# Patient Record
Sex: Male | Born: 1945 | Race: White | Hispanic: No | Marital: Married | Smoking: Never smoker
Health system: Southern US, Community
[De-identification: ages and names within clinical notes are randomized; demographics above are authoritative.]

## PROBLEM LIST (undated history)

## (undated) DIAGNOSIS — C801 Malignant (primary) neoplasm, unspecified: Secondary | ICD-10-CM

## (undated) DIAGNOSIS — E119 Type 2 diabetes mellitus without complications: Secondary | ICD-10-CM

## (undated) DIAGNOSIS — E7800 Pure hypercholesterolemia, unspecified: Secondary | ICD-10-CM

## (undated) DIAGNOSIS — I10 Essential (primary) hypertension: Secondary | ICD-10-CM

## (undated) DIAGNOSIS — I4891 Unspecified atrial fibrillation: Secondary | ICD-10-CM

## (undated) HISTORY — PX: SHOULDER SURGERY: SHX246

## (undated) HISTORY — PX: KNEE SURGERY: SHX244

## (undated) HISTORY — PX: REPLACEMENT TOTAL KNEE: SUR1224

---

## 2005-07-17 ENCOUNTER — Emergency Department (HOSPITAL_COMMUNITY): Admission: EM | Admit: 2005-07-17 | Discharge: 2005-07-17 | Payer: Self-pay | Admitting: *Deleted

## 2006-08-31 ENCOUNTER — Other Ambulatory Visit: Payer: Self-pay

## 2006-08-31 ENCOUNTER — Ambulatory Visit: Payer: Self-pay | Admitting: Unknown Physician Specialty

## 2006-09-01 ENCOUNTER — Ambulatory Visit: Payer: Self-pay | Admitting: Unknown Physician Specialty

## 2006-09-27 ENCOUNTER — Emergency Department: Payer: Self-pay | Admitting: Emergency Medicine

## 2006-10-17 ENCOUNTER — Emergency Department: Payer: Self-pay | Admitting: Emergency Medicine

## 2007-08-30 ENCOUNTER — Emergency Department (HOSPITAL_COMMUNITY): Admission: EM | Admit: 2007-08-30 | Discharge: 2007-08-30 | Payer: Self-pay | Admitting: Emergency Medicine

## 2007-08-30 ENCOUNTER — Ambulatory Visit: Payer: Self-pay | Admitting: Gastroenterology

## 2009-08-28 ENCOUNTER — Emergency Department: Payer: Self-pay | Admitting: Emergency Medicine

## 2009-09-30 ENCOUNTER — Emergency Department (HOSPITAL_COMMUNITY): Admission: EM | Admit: 2009-09-30 | Discharge: 2009-09-30 | Payer: Self-pay | Admitting: Emergency Medicine

## 2010-07-29 NOTE — Op Note (Signed)
Cameron Gutierrez, Cameron Gutierrez               ACCOUNT NO.:  [id number]   MEDICAL RECORD NO.:  [id number]          PATIENT TYPE:  EMS   LOCATION:  ED                            FACILITY:  [facility]   PHYSICIAN:  Kassie Mends, M.D.      DATE OF BIRTH:  26-Apr-1945   DATE OF PROCEDURE:  08/30/2007  DATE OF DISCHARGE:                               OPERATIVE REPORT   REFERRING PHYSICIAN:  Dr. Elesa Hacker.   PRIMARY PHYSICIAN:  Loma Sender in [place].   PROCEDURE:  Esophagogastroscopy.   INDICATION FOR EXAM:  Mr. Bonser a 65 year old male who was eating  chicken at [employer] approximately 2 hours ago and he got a  chicken bone lodged in his esophagus.  He presents with pain with  swallowing.  He has had no vomiting.  He had no heartburn, indigestion,  problems swallowing, rectal bleeding.  He did see a small amount of  blood in the vomit.   FINDINGS:  1. Chicken bone lodged in the hypopharynx.  The chicken bone was      snared and removed without difficulty.  A laceration was seen in      the posterior wall the hypopharynx.  2. Normal esophagus without evidence of Barrett's mass, erosion,      ulceration, stricture or foreign body.  3. The stomach was full of food and so a limited exam of the stomach      was performed.  The scope was not advanced into the duodenum due to      risk of aspiration.   DIAGNOSIS:  Foreign body in the hypopharynx.   RECOMMENDATIONS:  1. Return to the emergency department for subsequent evaluation.      Consider ENT consultation for subsequent management.  2. The patient was given the bone and pictures.   MEDICATIONS:  1. Demerol 100 mg IV.  2. Versed 4 mg IV.   PROCEDURE TECHNIQUE:  Physical exam was performed.  Informed consent was  obtained from the patient after explaining the benefits, risks and  alternatives of the procedure.  The patient was connected to the monitor  and placed in the left lateral position.  Continuous oxygen was provided  by  nasal cannula and IV medicine administered through an indwelling  cannula.  After administration of sedation, the patient's hypopharynx  was intubated and the scope was advanced into the hypopharynx.  The  chicken bone was identified.  A snare was used to retrieve the bone.  The scope was then removed and the scope was advanced into the proximal  stomach, which had a large amount of  food in it.  The scope was removed slowly.  I carefully examined the  color, texture, anatomy and integrity of the mucosa on the way out.  The  patient was recovered in endoscopy and discharged to the emergency  department in stable condition.      Kassie Mends, M.D.  Electronically Signed     SM/MEDQ  D:  08/30/2007  T:  08/30/2007  Job:  [id number]   cc:   Loma Sender  Fax: [phone]

## 2013-09-13 ENCOUNTER — Emergency Department: Payer: Self-pay | Admitting: Emergency Medicine

## 2013-09-24 ENCOUNTER — Emergency Department: Payer: Self-pay | Admitting: Emergency Medicine

## 2013-09-24 LAB — BASIC METABOLIC PANEL
ANION GAP: 8 (ref 7–16)
BUN: 29 mg/dL — ABNORMAL HIGH (ref 7–18)
CALCIUM: 8.8 mg/dL (ref 8.5–10.1)
CHLORIDE: 105 mmol/L (ref 98–107)
CO2: 28 mmol/L (ref 21–32)
CREATININE: 1.5 mg/dL — AB (ref 0.60–1.30)
EGFR (Non-African Amer.): 48 — ABNORMAL LOW
GFR CALC AF AMER: 55 — AB
GLUCOSE: 127 mg/dL — AB (ref 65–99)
Osmolality: 289 (ref 275–301)
Potassium: 3.8 mmol/L (ref 3.5–5.1)
SODIUM: 141 mmol/L (ref 136–145)

## 2013-09-24 LAB — CBC WITH DIFFERENTIAL/PLATELET
BASOS ABS: 0 10*3/uL (ref 0.0–0.1)
BASOS PCT: 0.4 %
EOS ABS: 0.3 10*3/uL (ref 0.0–0.7)
EOS PCT: 3.4 %
HCT: 43.7 % (ref 40.0–52.0)
HGB: 14.6 g/dL (ref 13.0–18.0)
LYMPHS ABS: 1.4 10*3/uL (ref 1.0–3.6)
Lymphocyte %: 17.4 %
MCH: 29.1 pg (ref 26.0–34.0)
MCHC: 33.4 g/dL (ref 32.0–36.0)
MCV: 87 fL (ref 80–100)
Monocyte #: 0.7 x10 3/mm (ref 0.2–1.0)
Monocyte %: 8.6 %
Neutrophil #: 5.5 10*3/uL (ref 1.4–6.5)
Neutrophil %: 70.2 %
Platelet: 170 10*3/uL (ref 150–440)
RBC: 5.01 10*6/uL (ref 4.40–5.90)
RDW: 13.2 % (ref 11.5–14.5)
WBC: 7.9 10*3/uL (ref 3.8–10.6)

## 2014-07-28 ENCOUNTER — Emergency Department
Admission: EM | Admit: 2014-07-28 | Discharge: 2014-07-28 | Disposition: A | Discharge status: Home / Self Care | Payer: Medicare Other | Attending: Emergency Medicine | Admitting: Emergency Medicine

## 2014-07-28 ENCOUNTER — Emergency Department: Payer: Medicare Other

## 2014-07-28 ENCOUNTER — Encounter: Payer: Self-pay | Admitting: Emergency Medicine

## 2014-07-28 DIAGNOSIS — Y9289 Other specified places as the place of occurrence of the external cause: Secondary | ICD-10-CM | POA: Insufficient documentation

## 2014-07-28 DIAGNOSIS — I10 Essential (primary) hypertension: Secondary | ICD-10-CM | POA: Insufficient documentation

## 2014-07-28 DIAGNOSIS — Y9389 Activity, other specified: Secondary | ICD-10-CM | POA: Diagnosis not present

## 2014-07-28 DIAGNOSIS — W01198A Fall on same level from slipping, tripping and stumbling with subsequent striking against other object, initial encounter: Secondary | ICD-10-CM | POA: Insufficient documentation

## 2014-07-28 DIAGNOSIS — Y998 Other external cause status: Secondary | ICD-10-CM | POA: Insufficient documentation

## 2014-07-28 DIAGNOSIS — S20221A Contusion of right back wall of thorax, initial encounter: Secondary | ICD-10-CM | POA: Diagnosis not present

## 2014-07-28 DIAGNOSIS — S3992XA Unspecified injury of lower back, initial encounter: Secondary | ICD-10-CM | POA: Diagnosis present

## 2014-07-28 HISTORY — DX: Essential (primary) hypertension: I10

## 2014-07-28 LAB — URINALYSIS COMPLETE WITH MICROSCOPIC (ARMC ONLY)
Bilirubin Urine: NEGATIVE
Glucose, UA: NEGATIVE mg/dL
KETONES UR: NEGATIVE mg/dL
Leukocytes, UA: NEGATIVE
NITRITE: NEGATIVE
Protein, ur: 30 mg/dL — AB
Specific Gravity, Urine: 1.019 (ref 1.005–1.030)
pH: 5 (ref 5.0–8.0)

## 2014-07-28 MED ORDER — HYDROCODONE-ACETAMINOPHEN 5-325 MG PO TABS: 1.0000 | ORAL_TABLET | ORAL | Status: DC | PRN

## 2014-07-28 MED ORDER — BACLOFEN 10 MG PO TABS: 10.0000 mg | ORAL_TABLET | Freq: Two times a day (BID) | ORAL | Status: DC

## 2014-07-28 MED ORDER — HYDROCODONE-ACETAMINOPHEN 5-325 MG PO TABS
1.0000 | ORAL_TABLET | Freq: Once | ORAL | Status: AC
  Administered 2014-07-28: 1 via ORAL

## 2014-07-28 MED ORDER — HYDROCODONE-ACETAMINOPHEN 5-325 MG PO TABS
ORAL_TABLET | ORAL | Status: AC
  Filled 2014-07-28: qty 1

## 2014-07-28 NOTE — ED Notes (Signed)
R low mid back

## 2014-07-28 NOTE — ED Notes (Signed)
Pt c/o pain in ribs

## 2014-07-28 NOTE — Discharge Instructions (Signed)
Contusion °A contusion is a deep bruise. Contusions happen when an injury causes bleeding under the skin. Signs of bruising include pain, puffiness (swelling), and discolored skin. The contusion may turn blue, purple, or yellow. °HOME CARE  °· Put ice on the injured area. °· Put ice in a plastic bag. °· Place a towel between your skin and the bag. °· Leave the ice on for 15-20 minutes, 03-04 times a day. °· Only take medicine as told by your doctor. °· Rest the injured area. °· If possible, raise (elevate) the injured area to lessen puffiness. °GET HELP RIGHT AWAY IF:  °· You have more bruising or puffiness. °· You have pain that is getting worse. °· Your puffiness or pain is not helped by medicine. °MAKE SURE YOU:  °· Understand these instructions. °· Will watch your condition. °· Will get help right away if you are not doing well or get worse. °Document Released: 08/19/2007 Document Revised: 05/25/2011 Document Reviewed: 01/05/2011 °ExitCare® Patient Information ©2015 [employer]. This information is not intended to replace advice given to you by your health care provider. Make sure you discuss any questions you have with your health care provider. ° °Blunt Trauma °You have been evaluated for injuries. You have been examined and your caregiver has not found injuries serious enough to require hospitalization. °It is common to have multiple bruises and sore muscles following an accident. These tend to feel worse for the first 24 hours. You will feel more stiffness and soreness over the next several hours and worse when you wake up the first morning after your accident. After this point, you should begin to improve with each passing day. The amount of improvement depends on the amount of damage done in the accident. °Following your accident, if some part of your body does not work as it should, or if the pain in any area continues to increase, you should return to the Emergency Department for re-evaluation.  °HOME  CARE INSTRUCTIONS  °Routine care for sore areas should include: °· Ice to sore areas every 2 hours for 20 minutes while awake for the next 2 days. °· Drink extra fluids (not alcohol). °· Take a hot or warm shower or bath once or twice a day to increase blood flow to sore muscles. This will help you "limber up". °· Activity as tolerated. Lifting may aggravate neck or back pain. °· Only take over-the-counter or prescription medicines for pain, discomfort, or fever as directed by your caregiver. Do not use aspirin. This may increase bruising or increase bleeding if there are small areas where this is happening. °SEEK IMMEDIATE MEDICAL CARE IF: °· Numbness, tingling, weakness, or problem with the use of your arms or legs. °· A severe headache is not relieved with medications. °· There is a change in bowel or bladder control. °· Increasing pain in any areas of the body. °· Short of breath or dizzy. °· Nauseated, vomiting, or sweating. °· Increasing belly (abdominal) discomfort. °· Blood in urine, stool, or vomiting blood. °· Pain in either shoulder in an area where a shoulder strap would be. °· Feelings of lightheadedness or if you have a fainting episode. °Sometimes it is not possible to identify all injuries immediately after the trauma. It is important that you continue to monitor your condition after the emergency department visit. If you feel you are not improving, or improving more slowly than should be expected, call your physician. If you feel your symptoms (problems) are worsening, return to the Emergency   Department immediately. Document Released: 11/26/2000 Document Revised: 05/25/2011 Document Reviewed: 10/19/2007 [facility] Patient Information 2015 [place], [place]. This information is not intended to replace advice given to you by your health care provider. Make sure you discuss any questions you have with your health care provider.  Your exam & x-ray are normal today. You have sustained a contusion/bruise  to the back/flank.  There are no bony rib fractures.  Apply ice and moist heat the the back for control of pain & spasms.  Take the prescription meds as directed.  Follow-up with your provider, or return to the ED as needed for chest pain, shortness of breath, or bloody urine.

## 2014-07-28 NOTE — ED Notes (Signed)
D/C instructions given and reviewed. Pt verbalized an understanding and has no additional questions or concerns at this time. Pt ambulating independently, family accompanying patient.  

## 2014-07-28 NOTE — ED Provider Notes (Signed)
[facility] Emergency Department Provider Note ?____________________________________________ ? Time seen: 1230 ? I have reviewed the triage vital signs and the nursing notes. ________ HISTORY ? Chief Complaint Back Pain  HPI  Cameron Gutierrez is a 69 y.o. male reports to the ED with complaint of pain to the posterior right back and flank. He describes injury after he was corralling his horse, and got knocked to the ground after the gait panel swung around hitting him in his back. He reports that the wind got knocked out of him when he hit the ground. He denies head injury, loss of consciousness, or other complaints at this time. He is here for evaluation and management of his symptoms, noting some early superficial bruising to his right flank. He rates his pain at 10 currently.  Past Medical History  Diagnosis Date  . Hypertension    There are no active problems to display for this patient. ? No past surgical history on file. ? Current Outpatient Rx  Name  Route  Sig  Dispense  Refill  . baclofen (LIORESAL) 10 MG tablet   Oral   Take 1 tablet (10 mg total) by mouth 2 (two) times daily.   10 each   0   . HYDROcodone-acetaminophen (NORCO) 5-325 MG per tablet   Oral   Take 1 tablet by mouth every 4 (four) hours as needed for moderate pain.   6 tablet   0    Allergies Review of patient's allergies indicates no known allergies. ? No family history on file. ? Social History History  Substance Use Topics  . Smoking status: Never Smoker   . Smokeless tobacco: Not on file  . Alcohol Use: No   Review of Systems  Constitutional: Negative for fever. HEENT: Negative for head trauma, visual changes, sore throat. Cardiovascular: Negative for chest pain. Respiratory: Negative for shortness of breath. Musculoskeletal: Negative for back pain. Skin: Negative for rash. Genitourinary: Denies dysuria or hematuria. Neurological: Negative for headaches, focal  weakness or numbness.  10-point ROS otherwise negative. ____________________________________________  PHYSICAL EXAM:  VITAL SIGNS: ED Triage Vitals  Enc Vitals Group     BP 07/28/14 1204 156/71 mmHg     Pulse Rate 07/28/14 1203 94     Resp 07/28/14 1203 20     Temp 07/28/14 1203 97.5 F (36.4 C)     Temp Source 07/28/14 1203 Oral     SpO2 07/28/14 1203 97 %     Weight 07/28/14 1204 200 lb (90.719 kg)     Height 07/28/14 1204 6' (1.829 m)     Head Cir --      Peak Flow --      Pain Score 07/28/14 1206 8     Pain Loc --      Pain Edu? --      Excl. in GC? --    Constitutional: Alert and oriented. Well appearing and in no distress. HEENT:Normocephalic and atraumatic.  PERRL. Normal extraocular movements.  No congestion/rhinnorhea. Mucous membranes are moist. Neck: Supple. No cervical lymphadenopathy. Cardiovascular: Normal rate, regular rhythm. No murmurs, rubs, or gallops. Normal and symmetric distal pulses are present in all extremities.  Respiratory: Normal respiratory effort without tachypnea. Breath sounds are clear and equal bilaterally. No wheezes/rales/rhonchi. Gastrointestinal: Soft and nontender. No distention. No abdominal bruits. There is CVA tenderness on the right. Musculoskeletal: Nontender with normal range of motion in all extremities. No joint effusions.  No lower extremity tenderness nor edema. Neurologic:  Normal speech and  language. CN II-XII grossly intact. No gait instability. Skin:  Skin is warm, dry and intact. No rash noted. He is noted to have superficial linear abrasions to the right flank. Psychiatric: Mood and affect are normal. Patient exhibits appropriate insight and judgment. ___________ RADIOLOGY  CXR IMPRESSION: No active cardiopulmonary disease.  LABS  Labs Reviewed  URINALYSIS COMPLETEWITH MICROSCOPIC [facility])  - Abnormal; Notable for the following:    Color, Urine YELLOW (*)    APPearance HAZY (*)    Hgb urine dipstick 1+ (*)     Protein, ur 30 (*)    Bacteria, UA RARE (*)    Squamous Epithelial / LPF 0-5 (*)    All other components within normal limits  _____________ PROCEDURES ? Procedure(s) performed: Norco 5/325 mg PO x 1 Critical Care performed: None ______________________________________________________ INITIAL IMPRESSION / ASSESSMENT AND PLAN / ED COURSE ? Results of CXR to patient & wife.  Symptoms and presentation consistent with back contusion and pain due to direct, blunt trauma. Prescription meds for pain and spasm given.  Instructions for reasons to return to the ED given.   Pertinent labs & imaging results that were available during my care of the patient were reviewed by me and considered in my medical decision making (see chart for details). ____________________________________________ FINAL CLINICAL IMPRESSION(S) / ED DIAGNOSES?  Final diagnoses:  Back contusion, right, initial encounter      Lissa HoardJenise V Bacon Liliahna Cudd, PA-C 07/28/14 1354  Minna AntisKevin Paduchowski, MD 07/29/14 1212

## 2015-07-25 ENCOUNTER — Emergency Department: Payer: Medicare Other

## 2015-07-25 ENCOUNTER — Emergency Department
Admission: EM | Admit: 2015-07-25 | Discharge: 2015-07-25 | Disposition: A | Discharge status: Home / Self Care | Payer: Medicare Other | Attending: Emergency Medicine | Admitting: Emergency Medicine

## 2015-07-25 ENCOUNTER — Encounter: Payer: Self-pay | Admitting: Emergency Medicine

## 2015-07-25 DIAGNOSIS — M25531 Pain in right wrist: Secondary | ICD-10-CM | POA: Diagnosis present

## 2015-07-25 DIAGNOSIS — Z79899 Other long term (current) drug therapy: Secondary | ICD-10-CM | POA: Diagnosis not present

## 2015-07-25 DIAGNOSIS — Z7982 Long term (current) use of aspirin: Secondary | ICD-10-CM | POA: Diagnosis not present

## 2015-07-25 DIAGNOSIS — I10 Essential (primary) hypertension: Secondary | ICD-10-CM | POA: Insufficient documentation

## 2015-07-25 DIAGNOSIS — M19031 Primary osteoarthritis, right wrist: Secondary | ICD-10-CM | POA: Diagnosis not present

## 2015-07-25 HISTORY — DX: Pure hypercholesterolemia, unspecified: E78.00

## 2015-07-25 MED ORDER — MELOXICAM 7.5 MG PO TABS: 7.5000 mg | ORAL_TABLET | Freq: Every day | ORAL | Status: AC

## 2015-07-25 MED ORDER — TRAMADOL HCL 50 MG PO TABS
50.0000 mg | ORAL_TABLET | Freq: Once | ORAL | Status: AC
  Administered 2015-07-25: 50 mg via ORAL
  Filled 2015-07-25: qty 1

## 2015-07-25 MED ORDER — HYDROCODONE-ACETAMINOPHEN 5-325 MG PO TABS: 1.0000 | ORAL_TABLET | ORAL | Status: DC | PRN

## 2015-07-25 NOTE — ED Notes (Signed)
States he woke up with pain /swelling to right hand/wrist area 2 days ago  denies any injury  Positive pulses

## 2015-07-25 NOTE — ED Notes (Signed)
Pt presents with right hand and wrist pain for two days. Pt states it throbs. Pt denies any injury. Swelling noted to right hand.

## 2015-07-25 NOTE — ED Provider Notes (Signed)
[facility] Emergency Department Provider Note   ____________________________________________  Time seen: Approximately 9:13 AM  I have reviewed the triage vital signs and the nursing notes.   HISTORY  Chief Complaint Hand Pain   HPI Cameron Gutierrez is a 70 y.o. male is here with complaint of right hand and wrist pain for 2 days. Patient states that he has not had any history of injury but the area has swollen and his extremely tender to touch. Patient has decreased range of motion secondary to his pain. He has been taking Tylenol as often as he can however patient was told not to take anti-inflammatories by his pharmacist who said it would increase his blood pressure. Patient denies any history of gout. Currently he rates his pain a 10 out of 10.   Past Medical History  Diagnosis Date  . Hypertension   . Hypercholesteremia     There are no active problems to display for this patient.   Past Surgical History  Procedure Laterality Date  . Knee surgery    . Shoulder surgery      Current Outpatient Rx  Name  Route  Sig  Dispense  Refill  . amLODipine (NORVASC) 10 MG tablet   Oral   Take 10 mg by mouth daily.         Marland Kitchen. aspirin 81 MG tablet   Oral   Take 81 mg by mouth daily.         Marland Kitchen. atorvastatin (LIPITOR) 40 MG tablet   Oral   Take 40 mg by mouth daily.         Marland Kitchen. lisinopril (PRINIVIL,ZESTRIL) 10 MG tablet   Oral   Take 10 mg by mouth daily.         Marland Kitchen. HYDROcodone-acetaminophen (NORCO/VICODIN) 5-325 MG tablet   Oral   Take 1 tablet by mouth every 4 (four) hours as needed for moderate pain.   20 tablet   0   . meloxicam (MOBIC) 7.5 MG tablet   Oral   Take 1 tablet (7.5 mg total) by mouth daily.   30 tablet   2     Allergies Review of patient's allergies indicates no known allergies.  No family history on file.  Social History Social History  Substance Use Topics  . Smoking status: Never Smoker   . Smokeless  tobacco: None  . Alcohol Use: No    Review of Systems Constitutional: No fever/chills Cardiovascular: Denies chest pain. Respiratory: Denies shortness of breath. Gastrointestinal: No abdominal pain.  No nausea, no vomiting.   Musculoskeletal: Positive right hand and wrist pain. Skin: Negative for rash. Neurological: Negative for headaches, focal weakness or numbness.  10-point ROS otherwise negative.  ____________________________________________   PHYSICAL EXAM:  VITAL SIGNS: ED Triage Vitals  Enc Vitals Group     BP 07/25/15 0835 143/70 mmHg     Pulse Rate 07/25/15 0835 68     Resp 07/25/15 0835 17     Temp 07/25/15 0835 97.5 F (36.4 C)     Temp Source 07/25/15 0835 Oral     SpO2 07/25/15 0835 97 %     Weight 07/25/15 0835 200 lb (90.719 kg)     Height 07/25/15 0835 6' (1.829 m)     Head Cir --      Peak Flow --      Pain Score 07/25/15 0843 6     Pain Loc --      Pain Edu? --  Excl. in GC? --     Constitutional: Alert and oriented. Well appearing and in no acute distress. Eyes: Conjunctivae are normal. PERRL. EOMI. Head: Atraumatic. Nose: No congestion/rhinnorhea. Neck: No stridor.   Cardiovascular: Normal rate, regular rhythm. Grossly normal heart sounds.  Good peripheral circulation. Respiratory: Normal respiratory effort.  No retractions. Lungs CTAB. Musculoskeletal:Examination of the right wrist and hand there is some degenerative changes present in the wrist. There is moderate soft tissue swelling with extreme tenderness to palpation. There is no gross deformity. Digits move without any difficulty. No warmth or erythema is present. Capillary refill is less than 3 seconds. Motor sensory function intact. Neurologic:  Normal speech and language. No gross focal neurologic deficits are appreciated. No gait instability. Skin:  Skin is warm, dry and intact. No rash noted. No erythema, ecchymosis or abrasions seen. Psychiatric: Mood and affect are normal. Speech  and behavior are normal.  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 I, Tommi Rumps, personally viewed and evaluated these images (plain radiographs) as part of my medical decision making, as well as reviewing the written report by the radiologist. ____________________________________________   PROCEDURES  Procedure(s) performed: None  Critical Care performed: No  ____________________________________________   INITIAL IMPRESSION / ASSESSMENT AND PLAN / ED COURSE  Pertinent labs & imaging results that were available during my care of the patient were reviewed by me and considered in my medical decision making (see chart for details).  Patient started on Motrin 7.5 mg daily and told that he continue taking his regular medication. He is also given a prescription for Norco as needed for severe pain. He is to follow-up with his primary care doctor and take both these medications with him. ____________________________________________   FINAL CLINICAL IMPRESSION(S) / ED DIAGNOSES  Final diagnoses:  Wrist pain, acute, right  Osteoarthritis of right wrist, unspecified osteoarthritis type      NEW MEDICATIONS STARTED DURING THIS VISIT:  Discharge Medication List as of 07/25/2015 10:34 AM    START taking these medications   Details  meloxicam (MOBIC) 7.5 MG tablet Take 1 tablet (7.5 mg total) by mouth daily., Starting 07/25/2015, Until Fri 07/24/16, Print         Note:  This document was prepared using Dragon voice recognition software and may include unintentional dictation errors.    Tommi Rumps,  PA-C 07/25/15 1456  Jennye Moccasin, MD 07/25/15 [phone]

## 2015-07-25 NOTE — Discharge Instructions (Signed)
Continue taking your medication for blood pressure. Take meloxicam for arthritis and Norco for severe pain. These medications need to be taken with food. [facility] and take these medications with you so that they will know what you been taking. Ice and elevate your right wrist as needed for pain and swelling.

## 2019-05-02 ENCOUNTER — Other Ambulatory Visit: Payer: Self-pay | Admitting: Podiatry

## 2019-05-12 ENCOUNTER — Other Ambulatory Visit: Payer: Medicare Other

## 2019-05-16 ENCOUNTER — Ambulatory Visit: Admit: 2019-05-16 | Payer: Medicare Other | Admitting: Podiatry

## 2019-05-16 SURGERY — AMPUTATION, TOE
Anesthesia: Choice | Site: Toe | Laterality: Right

## 2019-06-09 ENCOUNTER — Encounter (HOSPITAL_COMMUNITY): Payer: Self-pay

## 2019-06-09 ENCOUNTER — Other Ambulatory Visit: Payer: Self-pay

## 2019-06-09 ENCOUNTER — Emergency Department (HOSPITAL_COMMUNITY): Payer: PRIVATE HEALTH INSURANCE

## 2019-06-09 ENCOUNTER — Emergency Department (HOSPITAL_COMMUNITY)
Admission: EM | Admit: 2019-06-09 | Discharge: 2019-06-09 | Disposition: A | Discharge status: Home / Self Care | Payer: PRIVATE HEALTH INSURANCE | Attending: Emergency Medicine | Admitting: Emergency Medicine

## 2019-06-09 DIAGNOSIS — Z79899 Other long term (current) drug therapy: Secondary | ICD-10-CM | POA: Insufficient documentation

## 2019-06-09 DIAGNOSIS — Y929 Unspecified place or not applicable: Secondary | ICD-10-CM | POA: Diagnosis not present

## 2019-06-09 DIAGNOSIS — I10 Essential (primary) hypertension: Secondary | ICD-10-CM | POA: Diagnosis not present

## 2019-06-09 DIAGNOSIS — Y999 Unspecified external cause status: Secondary | ICD-10-CM | POA: Diagnosis not present

## 2019-06-09 DIAGNOSIS — Y990 Civilian activity done for income or pay: Secondary | ICD-10-CM | POA: Diagnosis not present

## 2019-06-09 DIAGNOSIS — I4891 Unspecified atrial fibrillation: Secondary | ICD-10-CM | POA: Diagnosis not present

## 2019-06-09 DIAGNOSIS — Z7982 Long term (current) use of aspirin: Secondary | ICD-10-CM | POA: Diagnosis not present

## 2019-06-09 DIAGNOSIS — S81819A Laceration without foreign body, unspecified lower leg, initial encounter: Secondary | ICD-10-CM

## 2019-06-09 DIAGNOSIS — Z23 Encounter for immunization: Secondary | ICD-10-CM | POA: Diagnosis not present

## 2019-06-09 DIAGNOSIS — Y939 Activity, unspecified: Secondary | ICD-10-CM | POA: Insufficient documentation

## 2019-06-09 DIAGNOSIS — W208XXA Other cause of strike by thrown, projected or falling object, initial encounter: Secondary | ICD-10-CM | POA: Diagnosis not present

## 2019-06-09 DIAGNOSIS — S81812A Laceration without foreign body, left lower leg, initial encounter: Secondary | ICD-10-CM | POA: Diagnosis present

## 2019-06-09 MED ORDER — LIDOCAINE-EPINEPHRINE (PF) 2 %-1:200000 IJ SOLN
20.0000 mL | Freq: Once | INTRAMUSCULAR | Status: AC
  Administered 2019-06-09: 20 mL via INTRADERMAL
  Filled 2019-06-09: qty 20

## 2019-06-09 MED ORDER — TETANUS-DIPHTH-ACELL PERTUSSIS 5-2.5-18.5 LF-MCG/0.5 IM SUSP
0.5000 mL | Freq: Once | INTRAMUSCULAR | Status: AC
  Administered 2019-06-09: 0.5 mL via INTRAMUSCULAR
  Filled 2019-06-09: qty 0.5

## 2019-06-09 NOTE — ED Provider Notes (Signed)
[facility] EMERGENCY DEPARTMENT Provider Note   CSN: [id number] Arrival date & time: 06/09/19  1057     History Chief Complaint  Patient presents with  . Laceration  . Leg Injury    Cameron Gutierrez is a 74 y.o. male who presents with a left leg laceration. He states he was at work and an automatic gate closed on his left leg this morning. He had an immediate onset of pain and profuse bleeding. EMS was called and they placed a bandage on the leg. His coworker drove him to the ED. He denies numbness or tingling in the legs. He is unsure of his tetanus status. He is on Eliquis for A.fib - he was started on this yesterday.  HPI     Past Medical History:  Diagnosis Date  . Hypercholesteremia   . Hypertension     There are no problems to display for this patient.   Past Surgical History:  Procedure Laterality Date  . KNEE SURGERY    . SHOULDER SURGERY         No family history on file.  Social History   Tobacco Use  . Smoking status: Never Smoker  Substance Use Topics  . Alcohol use: No  . Drug use: Not on file    Home Medications Prior to Admission medications   Medication Sig Start Date End Date Taking? Authorizing Provider  amLODipine (NORVASC) 10 MG tablet Take 10 mg by mouth daily.    [provider]  aspirin 81 MG tablet Take 81 mg by mouth daily.    [provider]  atorvastatin (LIPITOR) 40 MG tablet Take 40 mg by mouth daily.    [provider]  HYDROcodone-acetaminophen (NORCO/VICODIN) 5-325 MG tablet Take 1 tablet by mouth every 4 (four) hours as needed for moderate pain. 07/25/15   Tommi Rumps, PA-C  lisinopril (PRINIVIL,ZESTRIL) 10 MG tablet Take 10 mg by mouth daily.    [provider]    Allergies    Patient has no known allergies.  Review of Systems   Review of Systems  Musculoskeletal: Positive for myalgias.  Skin: Positive for wound.  Neurological: Negative for weakness and  numbness.  Hematological: Bruises/bleeds easily.  All other systems reviewed and are negative.   Physical Exam Updated Vital Signs BP 117/68 (BP Location: Right Arm)   Pulse 69   Temp (!) 97.2 F (36.2 C) (Temporal)   Resp 12   Ht 6' (1.829 m)   Wt 92.1 kg   SpO2 94%   BMI 27.53 kg/m   Physical Exam Vitals and nursing note reviewed.  Constitutional:      General: He is not in acute distress.    Appearance: Normal appearance. He is well-developed. He is not ill-appearing.  HENT:     Head: Normocephalic and atraumatic.  Eyes:     General: No scleral icterus.       Right eye: No discharge.        Left eye: No discharge.     Conjunctiva/sclera: Conjunctivae normal.     Pupils: Pupils are equal, round, and reactive to light.  Cardiovascular:     Rate and Rhythm: Normal rate.  Pulmonary:     Effort: Pulmonary effort is normal. No respiratory distress.  Abdominal:     General: There is no distension.  Musculoskeletal:     Cervical back: Normal range of motion.     Comments: ~8cm curvilinear gaping deep laceration to the right mid shin  with oozing blood. No arterial bleed noted. Foot is warm and perfused with 2+ DP pulse  Skin:    General: Skin is warm and dry.  Neurological:     Mental Status: He is alert and oriented to person, place, and time.  Psychiatric:        Behavior: Behavior normal.       ED Results / Procedures / Treatments   Labs (all labs ordered are listed, but only abnormal results are displayed) Labs Reviewed - No data to display  EKG None  Radiology DG Tibia/Fibula Left  Result Date: 06/09/2019 CLINICAL DATA:  Left lower leg laceration after injury today. EXAM: LEFT TIBIA AND FIBULA - 2 VIEW COMPARISON:  None. FINDINGS: There is no evidence of fracture or other focal bone lesions. Soft tissue laceration is seen involving the medial portion of the proximal calf. No radiopaque foreign body is noted. IMPRESSION: No fracture or dislocation is noted.  Soft tissue laceration is seen involving medial portion of proximal calf. No radiopaque foreign body is noted. Electronically Signed   By: Marijo Conception M.D.   On: 06/09/2019 11:47    Procedures .Marland KitchenLaceration Repair  Date/Time: 06/09/2019 1:02 PM Performed by: Recardo Evangelist, PA-C Authorized by: Recardo Evangelist, PA-C   Consent:    Consent obtained:  Verbal   Consent given by:  Patient   Risks discussed:  Infection, pain, poor cosmetic result, poor wound healing, vascular damage and nerve damage   Alternatives discussed:  No treatment Anesthesia (see MAR for exact dosages):    Anesthesia method:  Local infiltration   Local anesthetic:  Lidocaine 2% WITH epi Laceration details:    Location:  Leg   Leg location:  L lower leg   Length (cm):  8   Depth (mm):  20 Repair type:    Repair type:  Intermediate Pre-procedure details:    Preparation:  Patient was prepped and draped in usual sterile fashion and imaging obtained to evaluate for foreign bodies Exploration:    Wound exploration: wound explored through full range of motion and entire depth of wound probed and visualized     Wound extent: vascular damage (capillary bleeding)     Wound extent: no muscle damage noted, no nerve damage noted, no tendon damage noted and no underlying fracture noted     Contaminated: no   Treatment:    Area cleansed with:  Shur-Clens   Amount of cleaning:  Standard   Irrigation solution:  Sterile water   Irrigation volume:  500cc   Irrigation method:  Syringe   Visualized foreign bodies/material removed: no   Skin repair:    Repair method:  Sutures   Suture size:  4-0   Suture material:  Prolene   Suture technique:  Simple interrupted   Number of sutures:  9 Approximation:    Approximation:  Close Post-procedure details:    Dressing:  Sterile dressing   Patient tolerance of procedure:  Tolerated well, no immediate complications   (including critical care time)    Medications  Ordered in ED Medications  lidocaine-EPINEPHrine (XYLOCAINE W/EPI) 2 %-1:200000 (PF) injection 20 mL (has no administration in time range)  Tdap (BOOSTRIX) injection 0.5 mL (has no administration in time range)    ED Course  I have reviewed the triage vital signs and the nursing notes.  Pertinent labs & imaging results that were available during my care of the patient were reviewed by me and considered in my medical decision making (see chart  for details).  74 year old male on blood thinners presents with a left leg laceration after being hit with a gait.  His vital signs are reassuring.  He is in no acute distress.  There is an approximately 8 cm gaping wound to the left shin which is oozing blood.  Patient has no paresthesias and distal pulses are intact.  X-ray was obtained which is negative for any foreign body or bony abnormality.  Area was cleansed and irrigated copiously.  Nine 4-0 Prolene stitches were placed with good hemostasis.  A sterile dressing was applied. Wound care discussed. Advised f/u in 2 weeks for stitches removal or sooner for wound check as needed. Shared visit with Dr. Madilyn Hook  MDM Rules/Calculators/A&P                       Final Clinical Impression(s) / ED Diagnoses Final diagnoses:  Laceration of left lower extremity, initial encounter    Rx / DC Orders ED Discharge Orders    None       Bethel Born, PA-C 06/09/19 1359    Tilden Fossa, MD 06/09/19 [phone]

## 2019-06-09 NOTE — ED Triage Notes (Signed)
Pt presents w/laceration to inner Left calf. Pt reports he was going through the [place] gait and it caught his lag. Takes blood thinners, bleeding controlled at this time.

## 2019-06-09 NOTE — Discharge Instructions (Addendum)
Keep area clean by washing with soap and water daily. Do not submerge in water or scrub stitches Apply a bandage at least once daily, change more often if it is dirty Watch for signs of infection (redness, drainage, worsening pain) Take Tylenol for pain as needed Have stitches removed in 14 days. There are 9 stitches total

## 2019-06-12 ENCOUNTER — Other Ambulatory Visit: Payer: Self-pay

## 2019-06-12 ENCOUNTER — Emergency Department
Admission: EM | Admit: 2019-06-12 | Discharge: 2019-06-12 | Disposition: A | Discharge status: Home / Self Care | Payer: PRIVATE HEALTH INSURANCE | Attending: Emergency Medicine | Admitting: Emergency Medicine

## 2019-06-12 ENCOUNTER — Encounter: Payer: Self-pay | Admitting: Emergency Medicine

## 2019-06-12 ENCOUNTER — Emergency Department: Payer: Medicare Other

## 2019-06-12 DIAGNOSIS — Y999 Unspecified external cause status: Secondary | ICD-10-CM | POA: Diagnosis not present

## 2019-06-12 DIAGNOSIS — M79662 Pain in left lower leg: Secondary | ICD-10-CM | POA: Insufficient documentation

## 2019-06-12 DIAGNOSIS — S81012A Laceration without foreign body, left knee, initial encounter: Secondary | ICD-10-CM

## 2019-06-12 DIAGNOSIS — S8002XA Contusion of left knee, initial encounter: Secondary | ICD-10-CM | POA: Diagnosis not present

## 2019-06-12 DIAGNOSIS — W268XXA Contact with other sharp object(s), not elsewhere classified, initial encounter: Secondary | ICD-10-CM | POA: Insufficient documentation

## 2019-06-12 DIAGNOSIS — Y929 Unspecified place or not applicable: Secondary | ICD-10-CM | POA: Diagnosis not present

## 2019-06-12 DIAGNOSIS — I10 Essential (primary) hypertension: Secondary | ICD-10-CM | POA: Diagnosis not present

## 2019-06-12 DIAGNOSIS — Z79899 Other long term (current) drug therapy: Secondary | ICD-10-CM | POA: Diagnosis not present

## 2019-06-12 DIAGNOSIS — Y939 Activity, unspecified: Secondary | ICD-10-CM | POA: Insufficient documentation

## 2019-06-12 DIAGNOSIS — S8992XA Unspecified injury of left lower leg, initial encounter: Secondary | ICD-10-CM | POA: Diagnosis present

## 2019-06-12 MED ORDER — HYDROCODONE-ACETAMINOPHEN 5-325 MG PO TABS
2.0000 | ORAL_TABLET | Freq: Once | ORAL | Status: AC
  Administered 2019-06-12: 14:00:00 2 via ORAL
  Filled 2019-06-12: qty 2

## 2019-06-12 MED ORDER — CLINDAMYCIN HCL 300 MG PO CAPS: 300.0000 mg | ORAL_CAPSULE | Freq: Three times a day (TID) | ORAL | 0 refills | Status: DC

## 2019-06-12 MED ORDER — CLINDAMYCIN HCL 150 MG PO CAPS
300.0000 mg | ORAL_CAPSULE | Freq: Once | ORAL | Status: AC
  Administered 2019-06-12: 15:00:00 300 mg via ORAL
  Filled 2019-06-12: qty 2

## 2019-06-12 MED ORDER — HYDROCODONE-ACETAMINOPHEN 5-325 MG PO TABS: 1.0000 | ORAL_TABLET | Freq: Four times a day (QID) | ORAL | 0 refills | Status: DC | PRN

## 2019-06-12 NOTE — ED Provider Notes (Signed)
[facility] Emergency Department Provider Note   ____________________________________________   First MD Initiated Contact with Patient 06/12/19 1345     (approximate)  I have reviewed the triage vital signs and the nursing notes.   HISTORY  Chief Complaint Leg Swelling    HPI Cameron Gutierrez is a 74 y.o. male history of hypertension hyperlipidemia also anticoagulated on Eliquis  Patient reports that few days ago he got injured with his leg getting struck by a large metal gate.  Is caused a laceration to his left inner calf.  He had to have stitches and noticed that the area has become increasingly swollen and painful especially just below his left knee.  Reports pain is severe when he tries to walk or stand up on it, also noticed a little bit of redness at his stitching site  No fevers or chills.  No chest pain no trouble breathing.  Currently not take any pain medication.  Reports pretty significant pain when tries to walk but at rest he is comfortable.  He has been resting with his leg elevated  Wife saw small amount of drainage from stitches of his left leg over the last day and a little bit of redness in the area   Past Medical History:  Diagnosis Date  . Hypercholesteremia   . Hypertension     There are no problems to display for this patient.   Past Surgical History:  Procedure Laterality Date  . KNEE SURGERY    . SHOULDER SURGERY      Prior to Admission medications   Medication Sig Start Date End Date Taking? Authorizing Provider  amLODipine (NORVASC) 10 MG tablet Take 10 mg by mouth daily.    [provider]  aspirin 81 MG tablet Take 81 mg by mouth daily.    [provider]  atorvastatin (LIPITOR) 40 MG tablet Take 40 mg by mouth daily.    [provider]  clindamycin (CLEOCIN) 300 MG capsule Take 1 capsule (300 mg total) by mouth 3 (three) times daily. 06/12/19   Sharyn Creamer, MD    HYDROcodone-acetaminophen (NORCO/VICODIN) 5-325 MG tablet Take 1-2 tablets by mouth every 6 (six) hours as needed for moderate pain. 06/12/19   Sharyn Creamer, MD  lisinopril (PRINIVIL,ZESTRIL) 10 MG tablet Take 10 mg by mouth daily.    [provider]    Allergies Patient has no known allergies.  No family history on file.  Social History Social History   Tobacco Use  . Smoking status: Never Smoker  . Smokeless tobacco: Never Used  Substance Use Topics  . Alcohol use: No  . Drug use: Not on file    Review of Systems Constitutional: No fever/chills  ENT: No sore throat. Cardiovascular: Denies chest pain. Respiratory: Denies shortness of breath. Gastrointestinal: No abdominal pain.   Musculoskeletal: Negative for back pain.  No numbness tingling or weakness in the left leg.  No cold or blue foot. Skin: Quite a bit of bruising occurring behind the back of his left knee Neurological: Negative for headaches, areas of focal weakness or numbness.    ____________________________________________   PHYSICAL EXAM:  VITAL SIGNS: ED Triage Vitals  Enc Vitals Group     BP 06/12/19 1118 119/67     Pulse Rate 06/12/19 1118 73     Resp 06/12/19 1118 16     Temp 06/12/19 1118 97.9 F (36.6 C)     Temp Source 06/12/19 1118 Oral     SpO2 06/12/19  1118 99 %     Weight 06/12/19 1119 204 lb 9.4 oz (92.8 kg)     Height 06/12/19 1119 6' (1.829 m)     Head Circumference --      Peak Flow --      Pain Score 06/12/19 1118 8     Pain Loc --      Pain Edu? --      Excl. in [place]? --     Constitutional: Alert and oriented. Well appearing and in no acute distress. Eyes: Conjunctivae are normal. Head: Atraumatic. Nose: No congestion/rhinnorhea. Mouth/Throat: Mucous membranes are moist. Neck: No stridor.  Cardiovascular: Normal rate, regular rhythm. Grossly normal heart sounds.  Good peripheral circulation. Respiratory: Normal respiratory effort.  No retractions. Lungs  CTAB. Gastrointestinal: Soft and nontender. No distention. Musculoskeletal:   Lower Extremities  Strong palpable dorsalis pedis and posterior tibial pulses left lower extremity.  Normal capillary refill left lower leg foot.  LEFT Left lower extremity demonstrates normal strength, good use of all muscles but some limitation, reports significant pain with flexing or extending the left knee. No bruising or contusions of the hip, foot, ankle.  Mild edema of the left lower foot and ankle .  Full range of motion of left foot left ankle.  Normal sensation across the top and bottom of the left foot.  No pain on axial loading. No evidence of trauma.  No pain with plantar dorsiflexion.  Compartments of the leg feels soft, however he does have an area of induration and significant bruising overlying primarily the medial surface of the proximal one third of the lower leg including some swelling and tenderness across the popliteal fossa that seems to extend down but not quite to the level of his laceration.  This laceration seems to be separate, and has sutures that are well approximated with just a very slight amount of serosanguineous drainage but no purulence.  There is slight redness to the superior mid suture line and slight warmth that extends about 2 to 3 cm proximal.  Did remove one suture from the middle of his laceration, loosen the slightly and assessed for any underlying purulence.  Unable to express purulence.  Slight serosanguineous drainage only present.  The left knee does not have any notable edema induration or redness.  No joint effusions are noted.  Compartments soft.   Neurologic:  Normal speech and language. No gross focal neurologic deficits are appreciated.  Skin:  Skin is warm, dry and intact. No rash noted. Psychiatric: Mood and affect are normal. Speech and behavior are normal.  ____________________________________________   LABS (all labs ordered are listed, but only abnormal  results are displayed)  Labs Reviewed - No data to display ____________________________________________  EKG   ____________________________________________  RADIOLOGY  US Venous Img Lower Unilateral Left  Result Date: 06/12/2019 CLINICAL DATA:  Soft tissue swelling following recent lower extremity injury EXAM: LEFT LOWER EXTREMITY VENOUS DUPLEX ULTRASOUND TECHNIQUE: Gray-scale sonography with graded compression, as well as color Doppler and duplex ultrasound were performed to evaluate the left lower extremity deep venous system from the level of the common femoral vein and including the common femoral, femoral, profunda femoral, popliteal and calf veins including the posterior tibial, peroneal and gastrocnemius veins when visible. The superficial great saphenous vein was also interrogated. Spectral Doppler was utilized to evaluate flow at rest and with distal augmentation maneuvers in the common femoral, femoral and popliteal veins. COMPARISON:  None. FINDINGS: Contralateral Common Femoral Vein: Respiratory phasicity is normal and  symmetric with the symptomatic side. No evidence of thrombus. Normal compressibility. Common Femoral Vein: No evidence of thrombus. Normal compressibility, respiratory phasicity and response to augmentation. Saphenofemoral Junction: No evidence of thrombus. Normal compressibility and flow on color Doppler imaging. Profunda Femoral Vein: No evidence of thrombus. Normal compressibility and flow on color Doppler imaging. Femoral Vein: No evidence of thrombus. Normal compressibility, respiratory phasicity and response to augmentation. Popliteal Vein: No evidence of thrombus. Normal compressibility, respiratory phasicity and response to augmentation. Calf Veins: No evidence of thrombus. Normal compressibility and flow on color Doppler imaging. Superficial Great Saphenous Vein: No evidence of thrombus. Normal compressibility. Venous Reflux:  None. Other Findings: There is a complex  fluid collection in the left popliteal fossa region measuring 8.2 x 2.9 x 2.9 cm. IMPRESSION: No evidence of deep venous thrombosis in the left lower extremity. Right common femoral vein patent. Complex fluid collection in the left popliteal fossa region measuring 8.8 x 2.9 x 2.9 cm, a likely Baker cyst which has either undergone hemorrhage or infection. Electronically Signed   By: Bretta Bang III M.D.   On: 06/12/2019 12:14     Imaging reviewed, as noted above.  Discussed with Dr. Rosita Kea ____________________________________________   PROCEDURES  Procedure(s) performed: None  Procedures  Critical Care performed: No  ____________________________________________   INITIAL IMPRESSION / ASSESSMENT AND PLAN / ED COURSE  Pertinent labs & imaging results that were available during my care of the patient were reviewed by me and considered in my medical decision making (see chart for details).   Patient presents for injury ongoing pain and some swelling now behind the left knee posterior fossa.  Appears most consistent on examination with significant ecchymosis as likely a hematoma that is formed I suspect related to his Eliquis use.  Also his laceration appears well approximated and just slight erythema of the superior edge, will start him on clindamycin after discussing risks and benefits including the risk of C. difficile, as well as hydrocodone for pain relief.  I do not see evidence of a compartment syndrome, and given my low pretest probability of this I think that introducing a Stryker needle to measure compartment pressures given his anticoagulation status and mild symptomatology and reassuring exam would be of risk and less benefit.  Clinical Course as of Jun 11 1449  Mon Jun 12, 2019  1401 Case, history, ultrasound, exam, discussed with Dr. Rosita Kea.   [MQ]    Clinical Course User Index [MQ] Sharyn Creamer, MD   Discussed with orthopedics Dr. Rosita Kea, he is recommending close follow-up in  the clinic.  Discussed with patient and his wife, discussed careful return precautions including symptoms of worsening infection, fevers chills increasing redness, he will continue to do bandage changes twice daily and monitor.  He will also come back if he starts to notice any increasing or significant increased swelling or pain  He is using crutches at home, and I have placed Ace wrap around his knee and proximal shin to help with compression and decreased swelling  Patient and wife comfortable with this plan.  ____________________________________________   FINAL CLINICAL IMPRESSION(S) / ED DIAGNOSES  Final diagnoses:  Knee laceration, left, subsequent  Hematoma of left knee region        Note:  This document was prepared using Dragon voice recognition software and may include unintentional dictation errors    See clinical media uploaded     Sharyn Creamer, MD 06/12/19 1452

## 2019-06-12 NOTE — ED Triage Notes (Signed)
Says had stitches put in left calf Friday after injury.  Now says his leg is swelling from foot to knee and cant wt bear.  Suture line is a bit red, but has hard dark bruising around inner knee area.

## 2021-04-09 ENCOUNTER — Other Ambulatory Visit: Payer: Self-pay

## 2021-04-09 ENCOUNTER — Ambulatory Visit
Admission: EM | Admit: 2021-04-09 | Discharge: 2021-04-09 | Disposition: A | Discharge status: Home / Self Care | Payer: Medicare Other | Attending: Family Medicine | Admitting: Family Medicine

## 2021-04-09 DIAGNOSIS — R3915 Urgency of urination: Secondary | ICD-10-CM | POA: Diagnosis not present

## 2021-04-09 DIAGNOSIS — Z79899 Other long term (current) drug therapy: Secondary | ICD-10-CM | POA: Diagnosis not present

## 2021-04-09 HISTORY — DX: Essential (primary) hypertension: I10

## 2021-04-09 LAB — POCT URINALYSIS DIP (MANUAL ENTRY)
Bilirubin, UA: NEGATIVE
Glucose, UA: NEGATIVE mg/dL
Ketones, POC UA: NEGATIVE mg/dL
Nitrite, UA: NEGATIVE
Protein Ur, POC: NEGATIVE mg/dL
Spec Grav, UA: 1.015 (ref 1.010–1.025)
Urobilinogen, UA: 0.2 E.U./dL
pH, UA: 5 (ref 5.0–8.0)

## 2021-04-09 MED ORDER — CEPHALEXIN 500 MG PO CAPS: 500.0000 mg | ORAL_CAPSULE | Freq: Two times a day (BID) | ORAL | 0 refills | Status: DC

## 2021-04-09 NOTE — ED Provider Notes (Signed)
[place]    ASSESSMENT & PLAN:  1. Urinary urgency   2. Long-term use of high-risk medication    Will treat for potential UTI. Begin: Meds ordered this encounter  Medications   cephALEXin (KEFLEX) 500 MG capsule    Sig: Take 1 capsule (500 mg total) by mouth 2 (two) times daily.    Dispense:  14 capsule    Refill:  0   No h/o prostate problems reported. No signs of pyelonephritis. Urine culture pending. BMP pending.   Follow-up Information     [facility].   Why: If worsening or failing to improve as anticipated. Contact information: [place] [phone]                Outlined signs and symptoms indicating need for more acute intervention. Patient verbalized understanding. After Visit Summary given.  SUBJECTIVE:  Cameron Gutierrez is a 76 y.o. male who complains of urinary urgency; abrupt onset; past several days. Without associated flank pain, fever, chills, penile discharge or bleeding. Gross hematuria: not present. No specific aggravating or alleviating factors reported. No LE edema. Normal PO intake without n/v/d. Without specific abdominal pain. Ambulatory without difficulty. No tx PTA.  He questions being told "that I had an abnormal kidney function"; years ago. No current PCP. Taking Rx medications as directed  Outpatient Medications:   Details  acetaminophen (TYLENOL) 500 MG tablet Take 500 mg by mouth every 6 (six) hours as needed., Historical Med    amLODipine (NORVASC) 10 MG tablet Take 10 mg by mouth daily., Starting Thu 03/20/2021, Historical Med    atorvastatin (LIPITOR) 40 MG tablet Take 40 mg by mouth every morning., Starting Thu 03/20/2021, Historical Med    ELIQUIS 5 MG TABS tablet Take 5 mg by mouth 2 (two) times daily., Starting Thu 03/20/2021, Historical Med    lisinopril (ZESTRIL) 5 MG tablet Take 5 mg by mouth every morning., Starting Sun 03/02/2021, Historical Med         OBJECTIVE:  Vitals:   04/09/21 1355  BP: (!) 142/79  Pulse: 80  Resp: 18  Temp: 97.9 F (36.6 C)  TempSrc: Oral  SpO2: 99%   General appearance: alert; no distress Lungs: unlabored respirations Abdomen: benign Back: no CVA tenderness Extremities: no edema; symmetrical with no gross deformities Skin: warm and dry Neurologic: normal gait Psychological: alert and cooperative; normal mood and affect  Labs Reviewed  POCT URINALYSIS DIP (MANUAL ENTRY) - Abnormal; Notable for the following components:      Result Value   Blood, UA moderate (*)    Leukocytes, UA Small (1+) (*)    All other components within normal limits  BASIC METABOLIC PANEL    No Known Allergies  Past Medical History:  Diagnosis Date   Hypertension    Social History   Socioeconomic History   Marital status: Married    Spouse name: Not on file   Number of children: Not on file   Years of education: Not on file   Highest education level: Not on file  Occupational History   Not on file  Tobacco Use   Smoking status: Never   Smokeless tobacco: Never  Substance and Sexual Activity   Alcohol use: Not Currently   Drug use: Never   Sexual activity: Not on file  Other Topics Concern   Not on file  Social History Narrative   Not on file   Social Determinants of Health  Financial Resource Strain: Not on file  Food Insecurity: Not on file  Transportation Needs: Not on file  Physical Activity: Not on file  Stress: Not on file  Social Connections: Not on file  Intimate Partner Violence: Not on file   History reviewed. No pertinent family history.      Vanessa Kick, MD 04/09/21 [phone]

## 2021-04-09 NOTE — Discharge Instructions (Addendum)
You have had labs (blood work and urine culture) sent today. We will call you with any significant abnormalities or if there is need to begin or change treatment or pursue further follow up.  You may also review your test results online through MyChart. If you do not have a MyChart account, instructions to sign up should be on your discharge paperwork.

## 2021-04-09 NOTE — ED Triage Notes (Signed)
Pt reports is being difficult for the past 5 days to start urinating. States he started having difficulty with urination after he was sick with "flu or something". Per wife she saw white flakes in pt urine.

## 2021-04-10 LAB — BASIC METABOLIC PANEL
BUN/Creatinine Ratio: 18 (ref 10–24)
BUN: 24 mg/dL (ref 8–27)
CO2: 23 mmol/L (ref 20–29)
Calcium: 9.1 mg/dL (ref 8.6–10.2)
Chloride: 104 mmol/L (ref 96–106)
Creatinine, Ser: 1.34 mg/dL — ABNORMAL HIGH (ref 0.76–1.27)
Glucose: 140 mg/dL — ABNORMAL HIGH (ref 70–99)
Potassium: 4.2 mmol/L (ref 3.5–5.2)
Sodium: 141 mmol/L (ref 134–144)
eGFR: 56 mL/min/{1.73_m2} — ABNORMAL LOW (ref >59)

## 2022-02-14 IMAGING — US US EXTREM LOW VENOUS*L*
1 series · 13 of 24 positions shown · non-contrast
Comparison: None.

CLINICAL DATA: Soft tissue swelling following recent lower
extremity injury

EXAM:
LEFT LOWER EXTREMITY VENOUS DUPLEX ULTRASOUND
TECHNIQUE: Gray-scale sonography with graded compression, as well as color
Doppler and duplex ultrasound were performed to evaluate the left
lower extremity deep venous system from the level of the common
femoral vein and including the common femoral, femoral, profunda
femoral, popliteal and calf veins including the posterior tibial,
peroneal and gastrocnemius veins when visible. The superficial great
saphenous vein was also interrogated. Spectral Doppler was utilized
to evaluate flow at rest and with distal augmentation maneuvers in
the common femoral, femoral and popliteal veins.

[Series 1: us venous img lower uni left (dvt) · portal-venous · 13 of 46 slices shown]
[im 1/46]
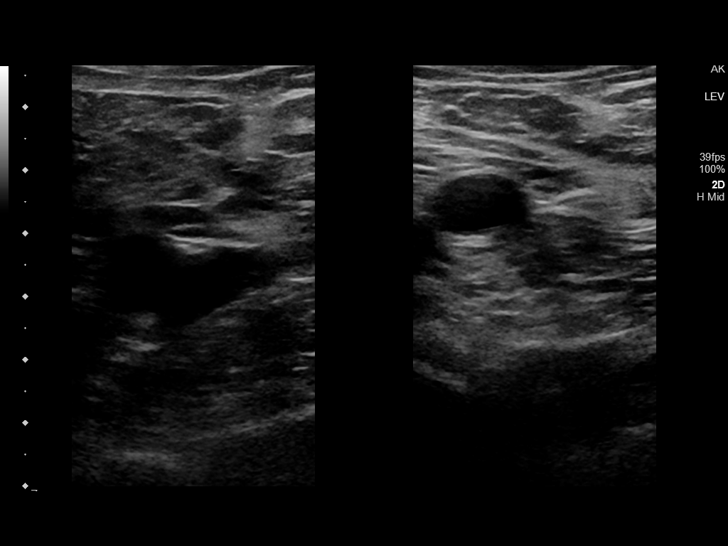
[im 4/46]
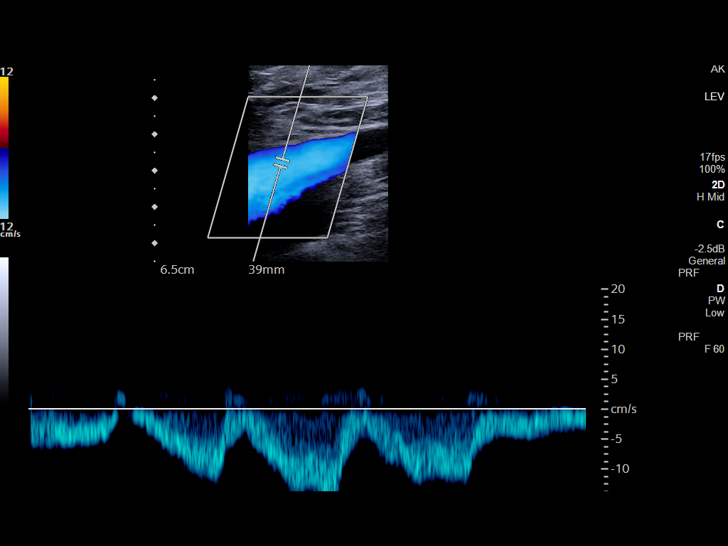
[im 8/46]
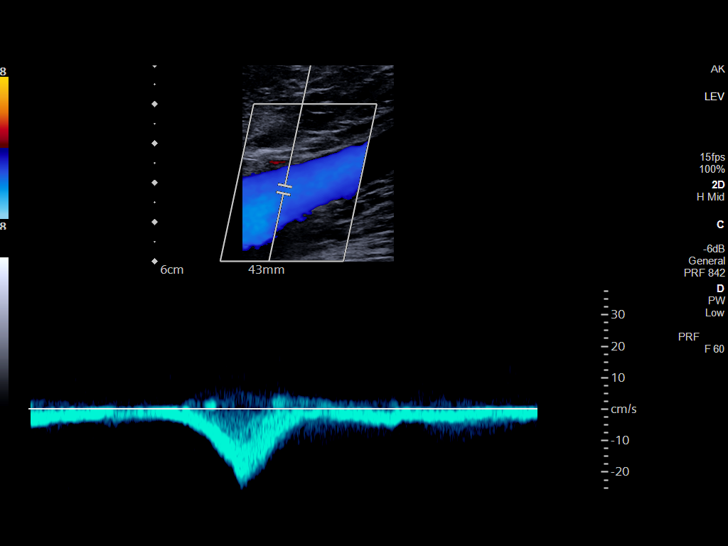
[im 12/46]
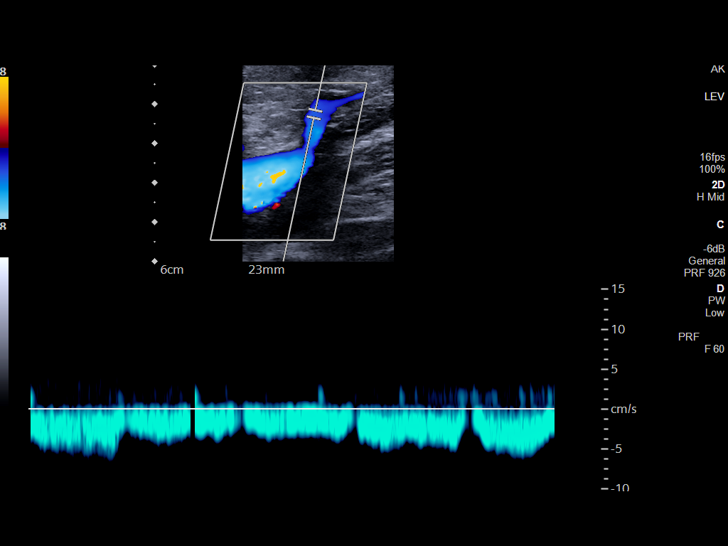
[im 16/46]
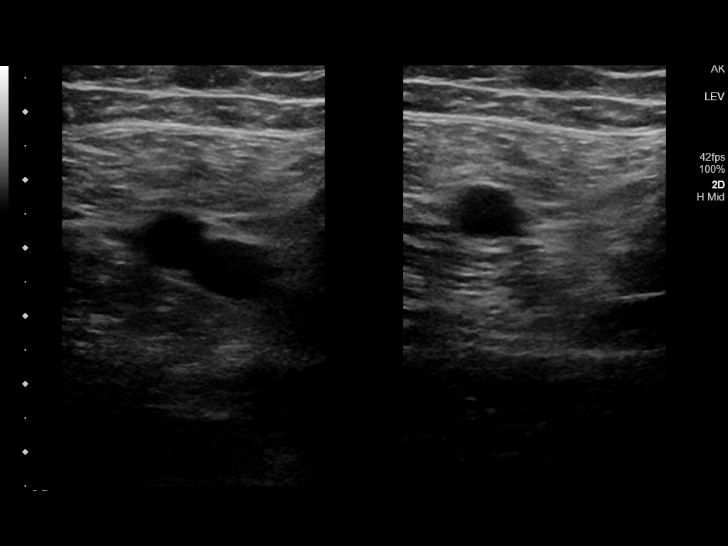
[im 20/46]
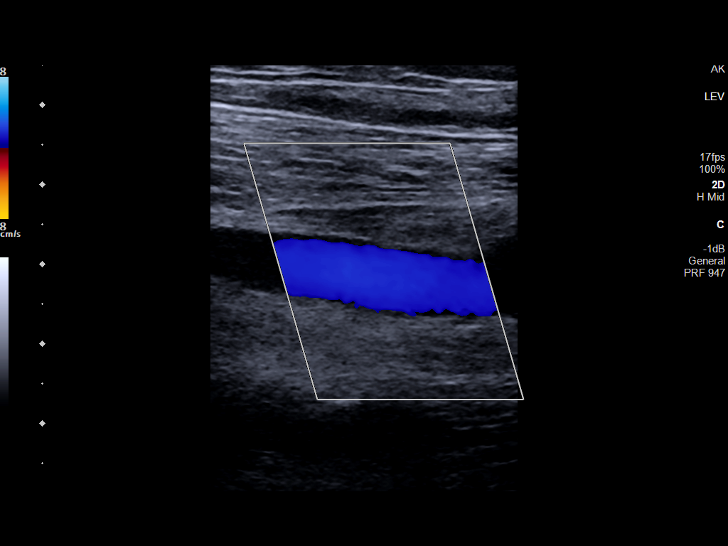
[im 24/46]
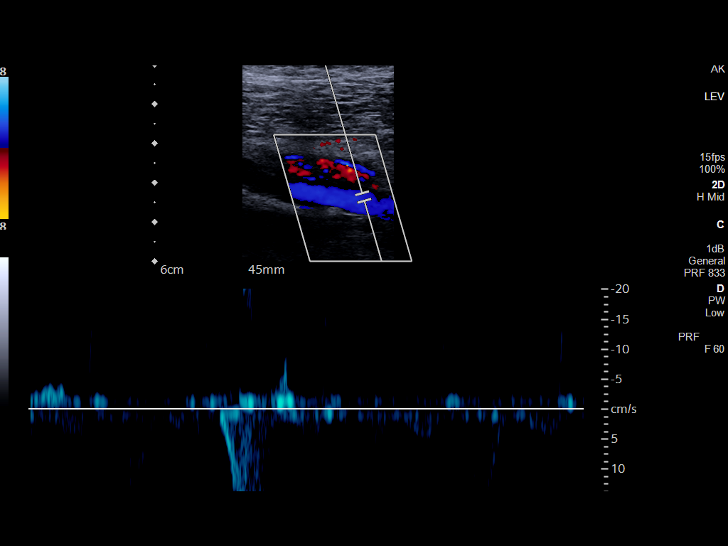
[im 26/46]
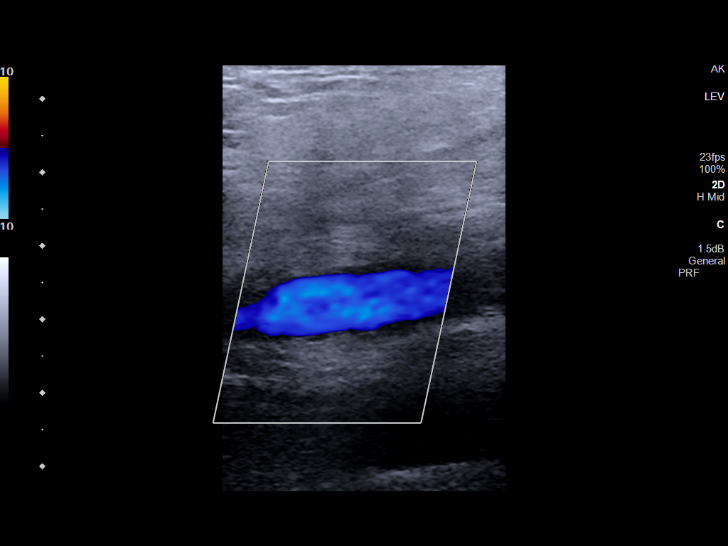
[im 30/46]
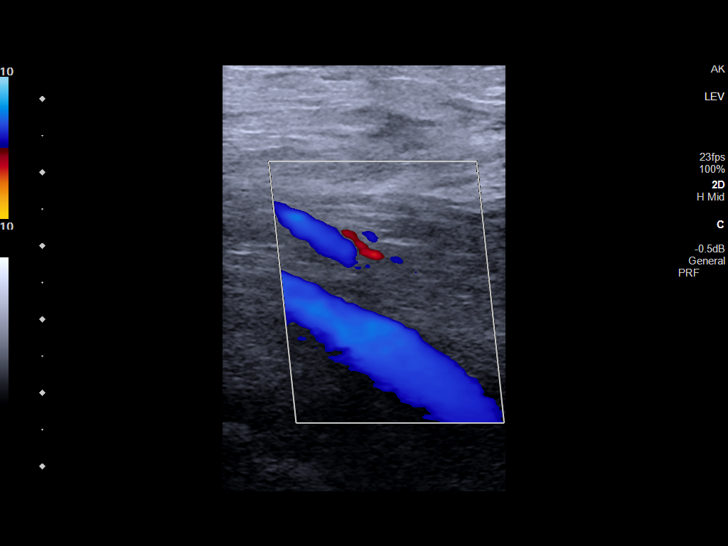
[im 34/46]
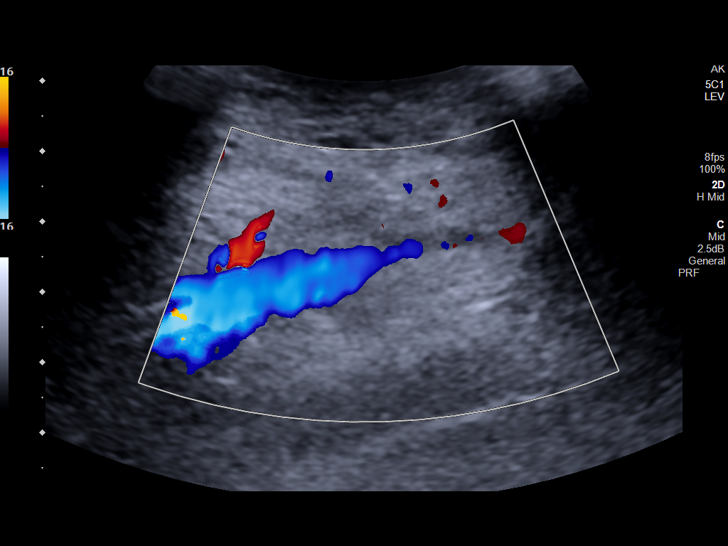
[im 38/46]
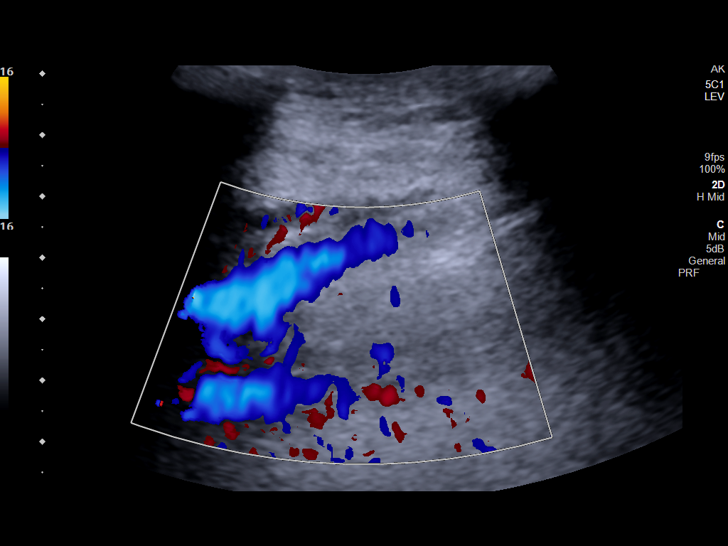
[im 42/46]
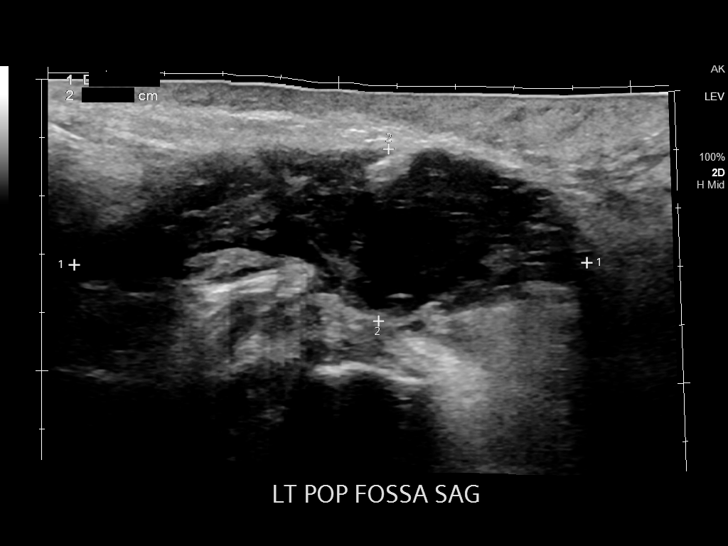
[im 46/46]
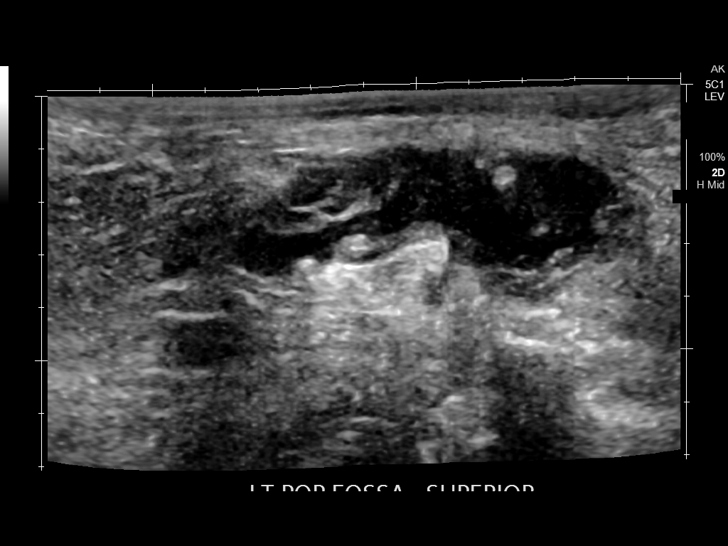

[13 of 24 positions shown; findings below may reference images not displayed]

FINDINGS: Contralateral Common Femoral Vein: Respiratory phasicity is normal
and symmetric with the symptomatic side. No evidence of thrombus.
Normal compressibility.

Common Femoral Vein: No evidence of thrombus. Normal
compressibility, respiratory phasicity and response to augmentation.

Saphenofemoral Junction: No evidence of thrombus. Normal
compressibility and flow on color Doppler imaging.

Profunda Femoral Vein: No evidence of thrombus. Normal
compressibility and flow on color Doppler imaging.

Femoral Vein: No evidence of thrombus. Normal compressibility,
respiratory phasicity and response to augmentation.

Popliteal Vein: No evidence of thrombus. Normal compressibility,
respiratory phasicity and response to augmentation.

Calf Veins: No evidence of thrombus. Normal compressibility and flow
on color Doppler imaging.

Superficial Great Saphenous Vein: No evidence of thrombus. Normal
compressibility.

Venous Reflux:  None.

Other Findings: There is a complex fluid collection in the left
popliteal fossa region measuring 8.2 x 2.9 x 2.9 cm.
IMPRESSION: No evidence of deep venous thrombosis in the left lower extremity.
Right common femoral vein patent.

Complex fluid collection in the left popliteal fossa region
measuring 8.8 x 2.9 x 2.9 cm, a likely Baker cyst which has either
undergone hemorrhage or infection.

## 2022-03-26 ENCOUNTER — Other Ambulatory Visit: Payer: Self-pay

## 2022-03-26 ENCOUNTER — Observation Stay (HOSPITAL_COMMUNITY)
Admission: EM | Admit: 2022-03-26 | Discharge: 2022-03-27 | Disposition: A | Discharge status: Home / Self Care | Payer: Medicare Other | Attending: Internal Medicine | Admitting: Internal Medicine

## 2022-03-26 ENCOUNTER — Encounter (HOSPITAL_COMMUNITY): Payer: Self-pay | Admitting: *Deleted

## 2022-03-26 ENCOUNTER — Encounter: Payer: Self-pay | Admitting: Emergency Medicine

## 2022-03-26 ENCOUNTER — Ambulatory Visit
Admission: EM | Admit: 2022-03-26 | Discharge: 2022-03-26 | Disposition: A | Discharge status: Home / Self Care | Payer: Medicare Other | Attending: Physician Assistant | Admitting: Physician Assistant

## 2022-03-26 DIAGNOSIS — I10 Essential (primary) hypertension: Secondary | ICD-10-CM | POA: Diagnosis not present

## 2022-03-26 DIAGNOSIS — R42 Dizziness and giddiness: Secondary | ICD-10-CM | POA: Insufficient documentation

## 2022-03-26 DIAGNOSIS — Z8679 Personal history of other diseases of the circulatory system: Secondary | ICD-10-CM

## 2022-03-26 DIAGNOSIS — Z7901 Long term (current) use of anticoagulants: Secondary | ICD-10-CM | POA: Diagnosis not present

## 2022-03-26 DIAGNOSIS — E871 Hypo-osmolality and hyponatremia: Secondary | ICD-10-CM | POA: Insufficient documentation

## 2022-03-26 DIAGNOSIS — E1100 Type 2 diabetes mellitus with hyperosmolarity without nonketotic hyperglycemic-hyperosmolar coma (NKHHC): Secondary | ICD-10-CM | POA: Diagnosis present

## 2022-03-26 DIAGNOSIS — N1832 Chronic kidney disease, stage 3b: Secondary | ICD-10-CM | POA: Insufficient documentation

## 2022-03-26 DIAGNOSIS — Z79899 Other long term (current) drug therapy: Secondary | ICD-10-CM | POA: Insufficient documentation

## 2022-03-26 DIAGNOSIS — Z7982 Long term (current) use of aspirin: Secondary | ICD-10-CM | POA: Insufficient documentation

## 2022-03-26 DIAGNOSIS — E782 Mixed hyperlipidemia: Secondary | ICD-10-CM | POA: Diagnosis not present

## 2022-03-26 DIAGNOSIS — Z96651 Presence of right artificial knee joint: Secondary | ICD-10-CM | POA: Insufficient documentation

## 2022-03-26 DIAGNOSIS — I129 Hypertensive chronic kidney disease with stage 1 through stage 4 chronic kidney disease, or unspecified chronic kidney disease: Secondary | ICD-10-CM | POA: Diagnosis not present

## 2022-03-26 DIAGNOSIS — R739 Hyperglycemia, unspecified: Secondary | ICD-10-CM

## 2022-03-26 DIAGNOSIS — R7989 Other specified abnormal findings of blood chemistry: Secondary | ICD-10-CM | POA: Diagnosis not present

## 2022-03-26 DIAGNOSIS — I4891 Unspecified atrial fibrillation: Secondary | ICD-10-CM | POA: Diagnosis not present

## 2022-03-26 DIAGNOSIS — E1165 Type 2 diabetes mellitus with hyperglycemia: Secondary | ICD-10-CM | POA: Diagnosis not present

## 2022-03-26 LAB — BASIC METABOLIC PANEL
Anion gap: 11 (ref 5–15)
BUN: 27 mg/dL — ABNORMAL HIGH (ref 8–23)
CO2: 21 mmol/L — ABNORMAL LOW (ref 22–32)
Calcium: 9.4 mg/dL (ref 8.9–10.3)
Chloride: 94 mmol/L — ABNORMAL LOW (ref 98–111)
Creatinine, Ser: 1.68 mg/dL — ABNORMAL HIGH (ref 0.61–1.24)
GFR, Estimated: 42 mL/min — ABNORMAL LOW (ref >60)
Glucose, Bld: 697 mg/dL (ref 70–99)
Potassium: 4.3 mmol/L (ref 3.5–5.1)
Sodium: 126 mmol/L — ABNORMAL LOW (ref 135–145)

## 2022-03-26 LAB — BETA-HYDROXYBUTYRIC ACID: Beta-Hydroxybutyric Acid: 0.56 mmol/L — ABNORMAL HIGH (ref 0.05–0.27)

## 2022-03-26 LAB — CBG MONITORING, ED
Glucose-Capillary: 346 mg/dL — ABNORMAL HIGH (ref 70–99)
Glucose-Capillary: 438 mg/dL — ABNORMAL HIGH (ref 70–99)
Glucose-Capillary: 527 mg/dL (ref 70–99)
Glucose-Capillary: >600 mg/dL (ref 70–99)

## 2022-03-26 LAB — CBC
HCT: 43.1 % (ref 39.0–52.0)
Hemoglobin: 14.6 g/dL (ref 13.0–17.0)
MCH: 28.2 pg (ref 26.0–34.0)
MCHC: 33.9 g/dL (ref 30.0–36.0)
MCV: 83.2 fL (ref 80.0–100.0)
Platelets: 202 10*3/uL (ref 150–400)
RBC: 5.18 MIL/uL (ref 4.22–5.81)
RDW: 12.5 % (ref 11.5–15.5)
WBC: 5.4 10*3/uL (ref 4.0–10.5)
nRBC: 0 % (ref 0.0–0.2)

## 2022-03-26 LAB — URINALYSIS, ROUTINE W REFLEX MICROSCOPIC
Bilirubin Urine: NEGATIVE
Glucose, UA: >=500 mg/dL — AB
Ketones, ur: NEGATIVE mg/dL
Nitrite: NEGATIVE
Protein, ur: NEGATIVE mg/dL
Specific Gravity, Urine: 1.024 (ref 1.005–1.030)
pH: 5 (ref 5.0–8.0)

## 2022-03-26 LAB — POCT FASTING CBG KUC MANUAL ENTRY: POCT Glucose (KUC): 600 mg/dL — AB (ref 70–99)

## 2022-03-26 LAB — BLOOD GAS, VENOUS
Acid-Base Excess: 2.7 mmol/L — ABNORMAL HIGH (ref 0.0–2.0)
Bicarbonate: 27.9 mmol/L (ref 20.0–28.0)
Drawn by: 7012
O2 Saturation: 22.9 %
Patient temperature: 36.4
pCO2, Ven: 43 mmHg — ABNORMAL LOW (ref 44–60)
pH, Ven: 7.42 (ref 7.25–7.43)
pO2, Ven: <31 mmHg — CL (ref 32–45)

## 2022-03-26 MED ORDER — LACTATED RINGERS IV BOLUS
20.0000 mL/kg | Freq: Once | INTRAVENOUS | Status: AC
  Administered 2022-03-26: 1814 mL via INTRAVENOUS

## 2022-03-26 MED ORDER — DEXTROSE IN LACTATED RINGERS 5 % IV SOLN: INTRAVENOUS | Status: DC

## 2022-03-26 MED ORDER — LACTATED RINGERS IV SOLN: INTRAVENOUS | Status: DC

## 2022-03-26 MED ORDER — POTASSIUM CHLORIDE 10 MEQ/100ML IV SOLN
10.0000 meq | INTRAVENOUS | Status: AC
  Administered 2022-03-26 – 2022-03-27 (×3): 10 meq via INTRAVENOUS
  Filled 2022-03-26 (×2): qty 100

## 2022-03-26 MED ORDER — INSULIN REGULAR(HUMAN) IN NACL 100-0.9 UT/100ML-% IV SOLN
INTRAVENOUS | Status: DC
  Administered 2022-03-26 (×2): 9.5 [IU]/h via INTRAVENOUS
  Filled 2022-03-26: qty 100

## 2022-03-26 MED ORDER — INSULIN ASPART 100 UNIT/ML IJ SOLN: 8.0000 [IU] | Freq: Once | INTRAMUSCULAR | Status: DC

## 2022-03-26 MED ORDER — DEXTROSE 50 % IV SOLN: 0.0000 mL | INTRAVENOUS | Status: DC | PRN

## 2022-03-26 MED ORDER — LACTATED RINGERS IV BOLUS: 30.0000 mL/kg | Freq: Once | INTRAVENOUS | Status: DC

## 2022-03-26 NOTE — H&P (Signed)
History and Physical    Patient: Cameron Gutierrez YHC:[id number] DOB: 11/28/1945 DOA: 03/26/2022 DOS: the patient was seen and examined on 03/27/2022 PCP: Pcp, No  Patient coming from: Home  Chief Complaint:  Chief Complaint  Patient presents with   Hyperglycemia   HPI: Cameron Gutierrez is a 77 y.o. male with medical history significant of hypertension, hyperlipidemia, no prior history of diabetes who presents to the emergency department due to 1 week onset of excessive thirst, urination, dry mouth, lightheadedness which was worse on standing and generalized fatigue.  Patient lives with granddaughter who has diabetes and asked her to help check his blood glucose level which on checking read " high".  He went to an urgent care and on checking his blood glucose at the UC, it also read "high".  He denies chest pain, shortness of breath, fever, chills, nausea or vomiting.  ED Course:  In the emergency department, temperature on arrival was 97.3, other vital signs were within normal range.  Workup in the ED showed normal CBC, sodium 126, potassium 4.3, chloride 94, bicarb 21, BUN 27, creatinine 1.68, CBG 697.  Urinalysis was unimpressive for UTI, beta-hydroxybutyrate acid 0.56 Patient was started on IV insulin per Endo tool, IV hydration was provided.  Hospitalist was asked to admit patient for further evaluation and management.  Review of Systems: Review of systems as noted in the HPI. All other systems reviewed and are negative.   Past Medical History:  Diagnosis Date   Hypercholesteremia    Hypertension    Past Surgical History:  Procedure Laterality Date   KNEE SURGERY     REPLACEMENT TOTAL KNEE Right    SHOULDER SURGERY      Social History:  reports that he has never smoked. He has never used smokeless tobacco. He reports that he does not currently use alcohol. He reports that he does not use drugs.   No Known Allergies  History reviewed. No pertinent family history.   Prior to  Admission medications   Medication Sig Start Date End Date Taking? Authorizing Provider  acetaminophen (TYLENOL) 500 MG tablet Take 500 mg by mouth every 6 (six) hours as needed.    [provider]  amLODipine (NORVASC) 10 MG tablet Take 10 mg by mouth daily.    [provider]  amLODipine (NORVASC) 10 MG tablet Take 10 mg by mouth daily. 03/20/21   [provider]  aspirin 81 MG tablet Take 81 mg by mouth daily.    [provider]  atorvastatin (LIPITOR) 40 MG tablet Take 40 mg by mouth daily.    [provider]  atorvastatin (LIPITOR) 40 MG tablet Take 40 mg by mouth every morning. 03/20/21   [provider]  cephALEXin (KEFLEX) 500 MG capsule Take 1 capsule (500 mg total) by mouth 2 (two) times daily. 04/09/21   Vanessa Kick, MD  clindamycin (CLEOCIN) 300 MG capsule Take 1 capsule (300 mg total) by mouth 3 (three) times daily. 06/12/19   Delman Kitten, MD  ELIQUIS 5 MG TABS tablet Take 5 mg by mouth 2 (two) times daily. 03/20/21   [provider]  HYDROcodone-acetaminophen (NORCO/VICODIN) 5-325 MG tablet Take 1-2 tablets by mouth every 6 (six) hours as needed for moderate pain. 06/12/19   Delman Kitten, MD  lisinopril (PRINIVIL,ZESTRIL) 10 MG tablet Take 10 mg by mouth daily.    [provider]  lisinopril (ZESTRIL) 5 MG tablet Take 5 mg by mouth every morning. 03/02/21   [provider]  Physical Exam: BP 96/70   Pulse (!) 57   Temp 98 F (36.7 C) (Oral)   Resp 15   Ht 6' (1.829 m)   Wt 90.7 kg   SpO2 95%   BMI 27.12 kg/m   General: 77 y.o. year-old male well developed well nourished in no acute distress.  Alert and oriented x3. HEENT: NCAT, EOMI, dry mucous membrane Neck: Supple, trachea medial Cardiovascular: Regular rate and rhythm with no rubs or gallops.  No thyromegaly or JVD noted.  No lower extremity edema. 2/4 pulses in all 4 extremities. Respiratory: Clear to auscultation with no wheezes or rales.  Good inspiratory effort. Abdomen: Soft, nontender nondistended with normal bowel sounds x4 quadrants. Muskuloskeletal: No cyanosis, clubbing or edema noted bilaterally Neuro: CN II-XII intact, strength 5/5 x 4, sensation, reflexes intact Skin: No ulcerative lesions noted or rashes Psychiatry: Judgement and insight appear normal. Mood is appropriate for condition and setting          Labs on Admission:  Basic Metabolic Panel: Recent Labs  Lab 03/26/22 2033 03/27/22 0549  NA 126* 136  K 4.3 3.5  CL 94* 104  CO2 21* 22  GLUCOSE 697* 140*  BUN 27* 20  CREATININE 1.68* 1.16  CALCIUM 9.4 8.7*  MG  --  2.2  PHOS  --  3.4   Liver Function Tests: Recent Labs  Lab 03/27/22 0549  AST 19  ALT 20  ALKPHOS 67  BILITOT 0.8  PROT 6.0*  ALBUMIN 3.3*   No results for input(s): "LIPASE", "AMYLASE" in the last 168 hours. No results for input(s): "AMMONIA" in the last 168 hours. CBC: Recent Labs  Lab 03/26/22 2033 03/27/22 0549  WBC 5.4 5.3  HGB 14.6 14.4  HCT 43.1 43.2  MCV 83.2 84.9  PLT 202 126*   Cardiac Enzymes: No results for input(s): "CKTOTAL", "CKMB", "CKMBINDEX", "TROPONINI" in the last 168 hours.  BNP (last 3 results) No results for input(s): "BNP" in the last 8760 hours.  ProBNP (last 3 results) No results for input(s): "PROBNP" in the last 8760 hours.  CBG: Recent Labs  Lab 03/27/22 0058 03/27/22 0212 03/27/22 0316 03/27/22 0435 03/27/22 0547  GLUCAP 294* 145* 251* 139* 138*    Radiological Exams on Admission: No results found.  EKG: I independently viewed the EKG done and my findings are as followed: EKG was not done in the ED  Assessment/Plan Present on Admission: **None**  Principal Problem:   Hyperglycemia Active Problems:   Pseudohyponatremia   Chronic kidney disease, stage 3b (HCC)   Essential hypertension   Mixed hyperlipidemia   History of atrial fibrillation  Hyperglycemia possibly secondary to new onset T2DM, rule out  HHS Continue insulin drip, IV LR with IV potassium per Endo tool Transition IV LR to D5 LR when serum glucose reaches 250mg /dL Consider transitioning to subcu insulin when blood glucose level is better controlled Continue NPO Hemoglobin A1c pending  Pseudohyponatremia Na 126, lactic acid based on CBG of 697 = 136  CKD 3B Renally adjust medications, avoid nephrotoxic agents/dehydration/hypotension  Essential hypertension BP meds will be held at this time due to soft BP  Mixed hyperlipidemia Continue Lipitor  History of atrial fibrillation Continue home Eliquis  DVT prophylaxis: Eliquis  Code Status: Full code  Family Communication: None at bedside  Consults: None  Severity of Illness: The appropriate patient status for this patient is OBSERVATION. Observation status is judged to be reasonable and necessary in order to provide the required intensity of service to  ensure the patient's safety. The patient's presenting symptoms, physical exam findings, and initial radiographic and laboratory data in the context of their medical condition is felt to place them at decreased risk for further clinical deterioration. Furthermore, it is anticipated that the patient will be medically stable for discharge from the hospital within 2 midnights of admission.   Author: Bernadette Hoit, DO 03/27/2022 6:36 AM  For on call review [email or web address].

## 2022-03-26 NOTE — ED Notes (Signed)
Patient is being discharged from the Urgent Care and sent to the Emergency Department via private vehicle . Per PA, patient is in need of higher level of care due to Blood sugar reading high on machine. Patient is aware and verbalizes understanding of plan of care.  Vitals:   03/26/22 1945  BP: 125/79  Pulse: 80  Resp: 18  Temp: (!) 97.3 F (36.3 C)  SpO2: 94%

## 2022-03-26 NOTE — ED Triage Notes (Signed)
States he thinks his blood sugar is high.  States he is drinking a lot and going to the bathroom a lot.  States blood sugar was checked  and registered high.

## 2022-03-26 NOTE — ED Triage Notes (Signed)
Pt with increased thirst and urination-denies burning with urination.  Pt sent from UC due to elevated blood sugar, read high there.

## 2022-03-26 NOTE — ED Provider Notes (Signed)
[facility] EMERGENCY DEPARTMENT Provider Note   CSN: [id number] Arrival date & time: 03/26/22  2002     History  Chief Complaint  Patient presents with   Hyperglycemia    AH BOTT is a 77 y.o. male with past medical history significant for hypertension, hyperlipidemia, no previous history of diabetes who presents with 1 week of rapidly progressing polydipsia, polyuria, dry mouth, dysgeusia, some lightheadedness with standing.  Patient was seen and evaluated at urgent care, had greater than 600 glucose and encouraged to return to the emergency department.  Patient reports granddaughter with diabetes, he is unsure what type but does report that she is on insulin, may be type I.  Patient denies significant sick symptoms, nausea, vomiting, difficulty breathing, chest pain, fever, chills, recent weight loss prior to this week, night sweats.   Hyperglycemia Associated symptoms: increased thirst and polyuria        Home Medications Prior to Admission medications   Medication Sig Start Date End Date Taking? Authorizing Provider  acetaminophen (TYLENOL) 500 MG tablet Take 500 mg by mouth every 6 (six) hours as needed.    [provider]  amLODipine (NORVASC) 10 MG tablet Take 10 mg by mouth daily.    [provider]  amLODipine (NORVASC) 10 MG tablet Take 10 mg by mouth daily. 03/20/21   [provider]  aspirin 81 MG tablet Take 81 mg by mouth daily.    [provider]  atorvastatin (LIPITOR) 40 MG tablet Take 40 mg by mouth daily.    [provider]  atorvastatin (LIPITOR) 40 MG tablet Take 40 mg by mouth every morning. 03/20/21   [provider]  cephALEXin (KEFLEX) 500 MG capsule Take 1 capsule (500 mg total) by mouth 2 (two) times daily. 04/09/21   Vanessa Kick, MD  clindamycin (CLEOCIN) 300 MG capsule Take 1 capsule (300 mg total) by mouth 3 (three) times daily. 06/12/19   Delman Kitten, MD  ELIQUIS 5 MG TABS tablet Take 5 mg by  mouth 2 (two) times daily. 03/20/21   [provider]  HYDROcodone-acetaminophen (NORCO/VICODIN) 5-325 MG tablet Take 1-2 tablets by mouth every 6 (six) hours as needed for moderate pain. 06/12/19   Delman Kitten, MD  lisinopril (PRINIVIL,ZESTRIL) 10 MG tablet Take 10 mg by mouth daily.    [provider]  lisinopril (ZESTRIL) 5 MG tablet Take 5 mg by mouth every morning. 03/02/21   [provider]      Allergies    Patient has no known allergies.    Review of Systems   Review of Systems  Endocrine: Positive for polydipsia and polyuria.  All other systems reviewed and are negative.   Physical Exam Updated Vital Signs BP 111/74 (BP Location: Right Arm)   Pulse 74   Temp (!) 97.5 F (36.4 C) (Oral)   Resp 19   Ht 6' (1.829 m)   Wt 90.7 kg   SpO2 99%   BMI 27.12 kg/m  Physical Exam Vitals and nursing note reviewed.  Constitutional:      General: He is not in acute distress.    Appearance: Normal appearance.  HENT:     Head: Normocephalic and atraumatic.     Mouth/Throat:     Mouth: Mucous membranes are dry.  Eyes:     General:        Right eye: No discharge.        Left eye: No discharge.  Cardiovascular:     Rate and Rhythm:  Normal rate and regular rhythm.     Heart sounds: No murmur heard.    No friction rub. No gallop.  Pulmonary:     Effort: Pulmonary effort is normal.     Breath sounds: Normal breath sounds.  Abdominal:     General: Bowel sounds are normal.     Palpations: Abdomen is soft.     Comments: No tenderness to palpation of the abdomen or distention noted  Skin:    General: Skin is warm and dry.     Capillary Refill: Capillary refill takes less than 2 seconds.  Neurological:     Mental Status: He is alert and oriented to person, place, and time.  Psychiatric:        Mood and Affect: Mood normal.        Behavior: Behavior normal.     ED Results / Procedures / Treatments   Labs (all labs ordered are listed, but only  abnormal results are displayed) Labs Reviewed  BASIC METABOLIC PANEL - Abnormal; Notable for the following components:      Result Value   Sodium 126 (*)    Chloride 94 (*)    CO2 21 (*)    Glucose, Bld 697 (*)    BUN 27 (*)    Creatinine, Ser 1.68 (*)    GFR, Estimated 42 (*)    All other components within normal limits  URINALYSIS, ROUTINE W REFLEX MICROSCOPIC - Abnormal; Notable for the following components:   Color, Urine STRAW (*)    Glucose, UA >=500 (*)    Hgb urine dipstick SMALL (*)    Leukocytes,Ua SMALL (*)    Bacteria, UA RARE (*)    All other components within normal limits  BLOOD GAS, VENOUS - Abnormal; Notable for the following components:   pCO2, Ven 43 (*)    pO2, Ven <31 (*)    Acid-Base Excess 2.7 (*)    All other components within normal limits  BETA-HYDROXYBUTYRIC ACID - Abnormal; Notable for the following components:   Beta-Hydroxybutyric Acid 0.56 (*)    All other components within normal limits  CBG MONITORING, ED - Abnormal; Notable for the following components:   Glucose-Capillary >600 (*)    All other components within normal limits  CBC    EKG None  Radiology No results found.  Procedures .Critical Care  Performed by: Olene Floss, PA-C Authorized by: Olene Floss, PA-C   Critical care provider statement:    Critical care time (minutes):  35   Critical care was necessary to treat or prevent imminent or life-threatening deterioration of the following conditions:  Endocrine crisis and metabolic crisis   Critical care was time spent personally by me on the following activities:  Development of treatment plan with patient or surrogate, discussions with consultants, evaluation of patient's response to treatment, examination of patient, ordering and review of laboratory studies, ordering and review of radiographic studies, ordering and performing treatments and interventions, pulse oximetry, re-evaluation of patient's condition  and review of old charts   Care discussed with: admitting provider       Medications Ordered in ED Medications  insulin regular, human (MYXREDLIN) 100 units/ 100 mL infusion (has no administration in time range)  lactated ringers infusion (has no administration in time range)  dextrose 5 % in lactated ringers infusion (has no administration in time range)  dextrose 50 % solution 0-50 mL (has no administration in time range)  potassium chloride 10 mEq in 100 mL IVPB (has  no administration in time range)  lactated ringers bolus 1,814 mL (1,814 mLs Intravenous New Bag/Given 03/26/22 2239)    ED Course/ Medical Decision Making/ A&P                           Medical Decision Making Amount and/or Complexity of Data Reviewed Labs: ordered.  Risk Prescription drug management.   This patient is a 77 y.o. male who presents to the ED for concern of hyperglycemia, polyuria, polydipsia, this involves an extensive number of treatment options, and is a complaint that carries with it a high risk of complications and morbidity. The emergent differential diagnosis prior to evaluation includes, but is not limited to, new onset diabetes, pancreatic dysfunction, DKA, HHS, other electrolyte abnormality, dehydration, sepsis, versus other, also considered UTI, pyelo-, kidney stone, but patient without abdominal pain, dysuria, hematuria. This is not an exhaustive differential.   Past Medical History / Co-morbidities / Social History: Hypertension, hyperlipidemia, notably no previous history of diabetes  Additional history: Chart reviewed. Pertinent results include: Reviewed previous lab work, imaging from emergency department visits, no recent PCP or other outpatient specialist visits on file  Physical Exam: Physical exam performed. The pertinent findings include: Patient clinically dry appearing but otherwise in no acute distress, stable vital signs, afebrile, not hypotensive, normal oxygen saturation on  room air  Lab Tests: I ordered, and personally interpreted labs.  The pertinent results include: CBC is unremarkable, BMP noted for pseudohyponatremia, true sodium around 140, glucose 697, he has an AKI with creatinine 1.68, mild bicarb deficit with bicarb at 21 but no anion gap, VBG reveals no acidosis, beta hydroxybutyrate is only mildly elevated at 0.56.  Patient hyperglycemic crisis but without evidence of acute DKA.   Medications: I ordered medication including fluids, insulin, potassium for new hyperglycemic crisis without DKA.  Patient will need reevaluation, and counseling given his new onset diabetes at 75 with no prior education.  Consultations Obtained: I requested consultation with the hospitalist, spoke with Dr. Josephine Cables,  and discussed lab and imaging findings as well as pertinent plan - they recommend: Admission at this time   Disposition: After consideration of the diagnostic results and the patients response to treatment, I feel that patient would benefit from admission at this time .   I discussed this case with my attending physician Dr. Sabra Heck who cosigned this note including patient's presenting symptoms, physical exam, and planned diagnostics and interventions. Attending physician stated agreement with plan or made changes to plan which were implemented.    Final Clinical Impression(s) / ED Diagnoses Final diagnoses:  Hyperglycemia    Rx / DC Orders ED Discharge Orders     None         Dorien Chihuahua 03/26/22 2251    Noemi Chapel, MD 03/26/22 2328

## 2022-03-26 NOTE — ED Provider Notes (Signed)
This patient is a 77 year old male, no history of diabetes but has had about 1 week of a very rapidly progressive polydipsia, polyuria, dry mouth and now lightheadedness with standing.  He was found to be severely hyperglycemic at the urgent care and referred to the ER.  On exam the patient has normotensive, he has dry mucous membranes, soft abdomen, no edema, normal mental status.  He does get lightheaded when he stands.  His metabolic panel shows that he is hyponatremic but this does correct with regards to his glucose of 697.  Creatinine is 1.68, there is beta hydroxybutyrate in the blood but his ABG shows no signs of severe acidosis.  At this time the patient likely has severe hyperglycemia with borderline DKA, will start insulin drip, patient will need to be admitted.   Noemi Chapel, MD 03/26/22 2328

## 2022-03-26 NOTE — ED Provider Notes (Signed)
[facility]    CSN: [id number] Arrival date & time: 03/26/22  1940      History   Chief Complaint No chief complaint on file.   HPI Cameron Gutierrez is a 77 y.o. male.   Patient reports he is urinating frequently and is having increased thirst.  Patient states he checked his glucose on a family members monitor and it read high.  The history is provided by the patient and the spouse. No language interpreter was used.    Past Medical History:  Diagnosis Date   Hypercholesteremia    Hypertension     There are no problems to display for this patient.   Past Surgical History:  Procedure Laterality Date   KNEE SURGERY     REPLACEMENT TOTAL KNEE Right    SHOULDER SURGERY         Home Medications    Prior to Admission medications   Medication Sig Start Date End Date Taking? Authorizing Provider  acetaminophen (TYLENOL) 500 MG tablet Take 500 mg by mouth every 6 (six) hours as needed.    [provider]  amLODipine (NORVASC) 10 MG tablet Take 10 mg by mouth daily.    [provider]  amLODipine (NORVASC) 10 MG tablet Take 10 mg by mouth daily. 03/20/21   [provider]  aspirin 81 MG tablet Take 81 mg by mouth daily.    [provider]  atorvastatin (LIPITOR) 40 MG tablet Take 40 mg by mouth daily.    [provider]  atorvastatin (LIPITOR) 40 MG tablet Take 40 mg by mouth every morning. 03/20/21   [provider]  cephALEXin (KEFLEX) 500 MG capsule Take 1 capsule (500 mg total) by mouth 2 (two) times daily. 04/09/21   Vanessa Kick, MD  clindamycin (CLEOCIN) 300 MG capsule Take 1 capsule (300 mg total) by mouth 3 (three) times daily. 06/12/19   Delman Kitten, MD  ELIQUIS 5 MG TABS tablet Take 5 mg by mouth 2 (two) times daily. 03/20/21   [provider]  HYDROcodone-acetaminophen (NORCO/VICODIN) 5-325 MG tablet Take 1-2 tablets by mouth every 6 (six) hours as needed for moderate pain. 06/12/19   Delman Kitten,  MD  lisinopril (PRINIVIL,ZESTRIL) 10 MG tablet Take 10 mg by mouth daily.    [provider]  lisinopril (ZESTRIL) 5 MG tablet Take 5 mg by mouth every morning. 03/02/21   [provider]    Family History History reviewed. No pertinent family history.  Social History Social History   Tobacco Use   Smoking status: Never   Smokeless tobacco: Never  Substance Use Topics   Alcohol use: Not Currently   Drug use: Never     Allergies   Patient has no known allergies.   Review of Systems Review of Systems  Genitourinary:  Positive for frequency.  All other systems reviewed and are negative.    Physical Exam Triage Vital Signs ED Triage Vitals  Enc Vitals Group     BP 03/26/22 1945 125/79     Pulse Rate 03/26/22 1945 80     Resp 03/26/22 1945 18     Temp 03/26/22 1945 (!) 97.3 F (36.3 C)     Temp Source 03/26/22 1945 Oral     SpO2 03/26/22 1945 94 %     Weight --      Height --      Head Circumference --      Peak Flow --      Pain Score  03/26/22 1944 0     Pain Loc --      Pain Edu? --      Excl. in [place]? --    No data found.  Updated Vital Signs BP 125/79 (BP Location: Right Arm)   Pulse 80   Temp (!) 97.3 F (36.3 C) (Oral)   Resp 18   SpO2 94%   Visual Acuity Right Eye Distance:   Left Eye Distance:   Bilateral Distance:    Right Eye Near:   Left Eye Near:    Bilateral Near:     Physical Exam Vitals and nursing note reviewed.  Constitutional:      Appearance: He is well-developed.  HENT:     Head: Normocephalic.  Cardiovascular:     Rate and Rhythm: Normal rate.  Pulmonary:     Effort: Pulmonary effort is normal.  Abdominal:     General: There is no distension.  Skin:    General: Skin is warm.  Neurological:     General: No focal deficit present.     Mental Status: He is alert.      UC Treatments / Results  Labs (all labs ordered are listed, but only abnormal results are displayed) Labs Reviewed  POCT FASTING  CBG [place] - Abnormal; Notable for the following components:      Result Value   POCT Glucose ([facility]) 600 (*)    All other components within normal limits    EKG   Radiology No results found.  Procedures Procedures (including critical care time)  Medications Ordered in UC Medications - No data to display  Initial Impression / Assessment and Plan / UC Course  I have reviewed the triage vital signs and the nursing notes.  Pertinent labs & imaging results that were available during my care of the patient were reviewed by me and considered in my medical decision making (see chart for details).     Glucose meter here reads high.  Given patient's current symptoms and reading he is advised to go to the emergency department for further laboratory evaluations I suspect he may need IV fluids and insulin. Final Clinical Impressions(s) / UC Diagnoses   Final diagnoses:  Hyperglycemia   Discharge Instructions   None    ED Prescriptions   None    PDMP not reviewed this encounter.   Fransico Meadow, [place] 03/26/22 1950

## 2022-03-27 DIAGNOSIS — N1832 Chronic kidney disease, stage 3b: Secondary | ICD-10-CM | POA: Insufficient documentation

## 2022-03-27 DIAGNOSIS — R739 Hyperglycemia, unspecified: Secondary | ICD-10-CM | POA: Diagnosis not present

## 2022-03-27 DIAGNOSIS — R7989 Other specified abnormal findings of blood chemistry: Secondary | ICD-10-CM | POA: Insufficient documentation

## 2022-03-27 DIAGNOSIS — I10 Essential (primary) hypertension: Secondary | ICD-10-CM | POA: Insufficient documentation

## 2022-03-27 DIAGNOSIS — Z8679 Personal history of other diseases of the circulatory system: Secondary | ICD-10-CM

## 2022-03-27 DIAGNOSIS — E782 Mixed hyperlipidemia: Secondary | ICD-10-CM | POA: Insufficient documentation

## 2022-03-27 LAB — HEMOGLOBIN A1C
Hgb A1c MFr Bld: 11.8 % — ABNORMAL HIGH (ref 4.8–5.6)
Mean Plasma Glucose: 291.96 mg/dL

## 2022-03-27 LAB — COMPREHENSIVE METABOLIC PANEL
ALT: 20 U/L (ref 0–44)
AST: 19 U/L (ref 15–41)
Albumin: 3.3 g/dL — ABNORMAL LOW (ref 3.5–5.0)
Alkaline Phosphatase: 67 U/L (ref 38–126)
Anion gap: 10 (ref 5–15)
BUN: 20 mg/dL (ref 8–23)
CO2: 22 mmol/L (ref 22–32)
Calcium: 8.7 mg/dL — ABNORMAL LOW (ref 8.9–10.3)
Chloride: 104 mmol/L (ref 98–111)
Creatinine, Ser: 1.16 mg/dL (ref 0.61–1.24)
GFR, Estimated: >60 mL/min (ref >60)
Glucose, Bld: 140 mg/dL — ABNORMAL HIGH (ref 70–99)
Potassium: 3.5 mmol/L (ref 3.5–5.1)
Sodium: 136 mmol/L (ref 135–145)
Total Bilirubin: 0.8 mg/dL (ref 0.3–1.2)
Total Protein: 6 g/dL — ABNORMAL LOW (ref 6.5–8.1)

## 2022-03-27 LAB — CBG MONITORING, ED
Glucose-Capillary: 138 mg/dL — ABNORMAL HIGH (ref 70–99)
Glucose-Capillary: 139 mg/dL — ABNORMAL HIGH (ref 70–99)
Glucose-Capillary: 145 mg/dL — ABNORMAL HIGH (ref 70–99)
Glucose-Capillary: 169 mg/dL — ABNORMAL HIGH (ref 70–99)
Glucose-Capillary: 181 mg/dL — ABNORMAL HIGH (ref 70–99)
Glucose-Capillary: 189 mg/dL — ABNORMAL HIGH (ref 70–99)
Glucose-Capillary: 251 mg/dL — ABNORMAL HIGH (ref 70–99)
Glucose-Capillary: 262 mg/dL — ABNORMAL HIGH (ref 70–99)
Glucose-Capillary: 263 mg/dL — ABNORMAL HIGH (ref 70–99)
Glucose-Capillary: 294 mg/dL — ABNORMAL HIGH (ref 70–99)
Glucose-Capillary: 305 mg/dL — ABNORMAL HIGH (ref 70–99)

## 2022-03-27 LAB — MAGNESIUM: Magnesium: 2.2 mg/dL (ref 1.7–2.4)

## 2022-03-27 LAB — CBC
HCT: 43.2 % (ref 39.0–52.0)
Hemoglobin: 14.4 g/dL (ref 13.0–17.0)
MCH: 28.3 pg (ref 26.0–34.0)
MCHC: 33.3 g/dL (ref 30.0–36.0)
MCV: 84.9 fL (ref 80.0–100.0)
Platelets: 126 10*3/uL — ABNORMAL LOW (ref 150–400)
RBC: 5.09 MIL/uL (ref 4.22–5.81)
RDW: 12.8 % (ref 11.5–15.5)
WBC: 5.3 10*3/uL (ref 4.0–10.5)
nRBC: 0 % (ref 0.0–0.2)

## 2022-03-27 LAB — PHOSPHORUS: Phosphorus: 3.4 mg/dL (ref 2.5–4.6)

## 2022-03-27 LAB — OSMOLALITY: Osmolality: 290 mOsm/kg (ref 275–295)

## 2022-03-27 MED ORDER — ONDANSETRON HCL 4 MG/2ML IJ SOLN: 4.0000 mg | Freq: Four times a day (QID) | INTRAMUSCULAR | Status: DC | PRN

## 2022-03-27 MED ORDER — "PEN NEEDLES 3/16"" 31G X 5 MM MISC": 2 refills | Status: DC

## 2022-03-27 MED ORDER — INSULIN ASPART 100 UNIT/ML IJ SOLN
0.0000 [IU] | INTRAMUSCULAR | Status: DC
  Administered 2022-03-27: 7 [IU] via SUBCUTANEOUS
  Filled 2022-03-27: qty 1

## 2022-03-27 MED ORDER — ONDANSETRON HCL 4 MG PO TABS: 4.0000 mg | ORAL_TABLET | Freq: Four times a day (QID) | ORAL | Status: DC | PRN

## 2022-03-27 MED ORDER — ACETAMINOPHEN 650 MG RE SUPP: 650.0000 mg | Freq: Four times a day (QID) | RECTAL | Status: DC | PRN

## 2022-03-27 MED ORDER — ATORVASTATIN CALCIUM 40 MG PO TABS
40.0000 mg | ORAL_TABLET | Freq: Every day | ORAL | Status: DC
  Administered 2022-03-27: 40 mg via ORAL
  Filled 2022-03-27: qty 1

## 2022-03-27 MED ORDER — ACETAMINOPHEN 325 MG PO TABS: 650.0000 mg | ORAL_TABLET | Freq: Four times a day (QID) | ORAL | Status: DC | PRN

## 2022-03-27 MED ORDER — LIVING WELL WITH DIABETES BOOK
Freq: Once | Status: DC
  Filled 2022-03-27: qty 1

## 2022-03-27 MED ORDER — BLOOD GLUCOSE MONITOR KIT: PACK | 0 refills | Status: DC

## 2022-03-27 MED ORDER — APIXABAN 5 MG PO TABS
5.0000 mg | ORAL_TABLET | Freq: Two times a day (BID) | ORAL | Status: DC
  Administered 2022-03-27: 5 mg via ORAL
  Filled 2022-03-27: qty 1

## 2022-03-27 MED ORDER — INSULIN GLARGINE-YFGN 100 UNIT/ML ~~LOC~~ SOLN
10.0000 [IU] | Freq: Once | SUBCUTANEOUS | Status: AC
  Administered 2022-03-27: 10 [IU] via SUBCUTANEOUS
  Filled 2022-03-27: qty 0.1

## 2022-03-27 MED ORDER — INSULIN DETEMIR 100 UNIT/ML FLEXPEN: 14.0000 [IU] | PEN_INJECTOR | Freq: Every day | SUBCUTANEOUS | 11 refills | Status: DC

## 2022-03-27 NOTE — Progress Notes (Signed)
  RD consulted for nutrition education regarding diabetes.   Lab Results  Component Value Date   HGBA1C 11.8 (H) 03/27/2022    RD provided "General Nutrition Guidelines for  People with Diabetes" handout. Patient also given Living Well with Diabetes Booklet  Diabetes coordinator as well as bedside nursing. Patient is being discharged today.   RD brief bedside visit to patient. Provided education handout as noted above. Encouraged patient to reach out if there are additional questions.   Expect good compliance.  Body mass index is 27.12 kg/m. Pt meets criteria for overweight based on current BMI.  Labs and medications reviewed.  CBG (last 3)  Recent Labs    03/27/22 0840 03/27/22 1026 03/27/22 1205  GLUCAP 169* 305* 262*       Latest Ref Rng & Units 03/27/2022    5:49 AM 03/26/2022    8:33 PM 04/09/2021    2:27 PM  BMP  Glucose 70 - 99 mg/dL 140  697  140   BUN 8 - 23 mg/dL 20  27  24    Creatinine 0.61 - 1.24 mg/dL 1.16  1.68  1.34   BUN/Creat Ratio 10 - 24   18   Sodium 135 - 145 mmol/L 136  126  141   Potassium 3.5 - 5.1 mmol/L 3.5  4.3  4.2   Chloride 98 - 111 mmol/L 104  94  104   CO2 22 - 32 mmol/L 22  21  23    Calcium 8.9 - 10.3 mg/dL 8.7  9.4  9.1      No further nutrition interventions warranted at this time. RD contact information provided. If additional nutrition issues arise, please re-consult RD.  Colman Cater MS,RD,CSG,LDN Contact: Shea Evans

## 2022-03-27 NOTE — TOC Progression Note (Addendum)
  Transition of Care [facility]) Screening Note   Patient Details  Name: Cameron Gutierrez Date of Birth: [birth year]   Transition of Care [facility]) CM/SW Contact:    Shade Flood, LCSW Phone Number: 03/27/2022, 9:19 AM    Primary Care Provider list attached to pt's AVS.  Transition of Care Department [facility]) has reviewed patient and no TOC needs have been identified at this time. We will continue to monitor patient advancement through interdisciplinary progression rounds. If new patient transition needs arise, please place a TOC consult.

## 2022-03-27 NOTE — Inpatient Diabetes Management (Addendum)
Inpatient Diabetes Program Recommendations  AACE/ADA: New Consensus Statement on Inpatient Glycemic Control  Target Ranges:  Prepandial:   less than 140 mg/dL      Peak postprandial:   less than 180 mg/dL (1-2 hours)      Critically ill patients:  140 - 180 mg/dL    Latest Reference Range & Units 03/27/22 00:58 03/27/22 02:12 03/27/22 03:16 03/27/22 04:35 03/27/22 05:47 03/27/22 06:37 03/27/22 07:30  Glucose-Capillary 70 - 99 mg/dL 294 (H) 145 (H) 251 (H) 139 (H) 138 (H) 181 (H) 189 (H)    Latest Reference Range & Units 03/26/22 20:33  CO2 22 - 32 mmol/L 21 (L)  Glucose 70 - 99 mg/dL 697 (HH)  Anion gap 5 - 15  11    Latest Reference Range & Units 03/26/22 20:33  Beta-Hydroxybutyric Acid 0.05 - 0.27 mmol/L 0.56 (H)    Latest Reference Range & Units 03/27/22 05:49  Hemoglobin A1C 4.8 - 5.6 % 11.8 (H)   Review of Glycemic Control  Diabetes history: No Outpatient Diabetes medications: NA Current orders for Inpatient glycemic control: IV insulin  Inpatient Diabetes Program Recommendations:    Insulin: Once provider is ready to transition from IV to SQ insulin, please consider ordering Semglee 14 units Q24H (based on 90.7 kg x 0.15 units), CBGs Q4H, and Novolog 0-9 units Q4H.  NOTE: Patient admitted with hyperglycemia (initial glucose 697 mg/dl on 03/26/22) and no hx of DM. Per H&P, "1 week onset of excessive thirst, urination, dry mouth, lightheadedness.  Patient lives with granddaughter who has diabetes and asked her to help check his blood glucose level which on checking read " high".  He went to an urgent care and on checking his blood glucose at the UC, it also read "high"." Ordered Living Well with Diabetes book, patient education by bedside nursing, and RD consult for diet education. Diabetes coordinator not on [place] today so will plan to call patient and discuss DM over the phone. Bedside nursing will need to work with patient on CBG monitoring and insulin  administration.  Addendum 03/27/22@13 :15-Spoke with patient over the phone about new diabetes diagnosis. Patient confirms that he does not have DM hx and states that he last seen PCP in [place] in January of 2023. Patient reports labs were done at that time and his glucose was normal then.  Discussed A1C results (11.8% on 03/27/22) and explained what an A1C is and informed patient that his current A1C indicates an average glucose of 292 mg/dl over the past 2-3 months. Discussed basic pathophysiology of DM Type 2, basic home care, importance of checking CBGs and maintaining good CBG control to prevent long-term and short-term complications. Reviewed glucose and A1C goals. Reviewed signs and symptoms of hyperglycemia and hypoglycemia along with treatment for both. Discussed impact of nutrition, exercise, stress, sickness, and medications on diabetes control. Patient drinks regular Pepsi and willing to change to diet soda. Discussed carb modified diet and encouraged patient to review nutrition labels on everything he eats and drinks. Patient reports that he still works in [employer] so he is very active everyday.   Discussed he should receive Living Well with diabetes booklet and encouraged patient to read through entire book. Informed patient that he will be prescribed insulin. Patient states that his wife takes an injection (not DM med) with a pen, he has a granddaughter that uses insulin pens and one that uses vial/syringe. Patient states that he prefers to use insulin pens.  Patient states that both  of his granddaughters are planning to help him at home for a few days to make sure he is doing everything he needs to do.  Asked patient to check his glucose 4 times per day (before meals and at bedtime) and to keep a log book of glucose readings and insulin taken. Explained how the doctor he follows up with can use the log book to continue to make insulin adjustments if needed. Discussed insulin storage, insulin  injection sites, and importance of injection site rotation. Informed patient that nursing will be asked to review insulin pen administration with him. Verbally reviewed the steps of using an insulin pen. Informed patient that outpatient diabetes education has been ordered so he will be contacted after discharge to set it up. Patient states he plans to call PCP in [facility] and get appointment to follow up as soon as he can. Patient verbalized understanding of information discussed and he states that he has no further questions at this time related to diabetes.  RNs to provide ongoing basic DM education at bedside with this patient and engage patient to actively check blood glucose and administer insulin injections. At discharge, please provide glucose monitoring kit, insulin pens, and insulin pen needles. Sent chat message to Rosana Fret, RN regarding discussion with patient and asked that RN show patient how to use an insulin pen.  Thanks, Barnie Alderman, RN, MSN, [place] Diabetes Coordinator Inpatient Diabetes Program [phone] (Team Pager from 8am to [place])

## 2022-03-27 NOTE — Discharge Summary (Signed)
Physician Discharge Summary  Cameron Gutierrez FTD:[id number] DOB: 12-Jun-1945 DOA: 03/26/2022  PCP: Merryl Hacker, No  Admit date: 03/26/2022  Discharge date: 03/27/2022  Admitted From:Home  Disposition:  Home  Recommendations for Outpatient Follow-up:  Follow up with PCP in 1-2 weeks Continue on Levemir FlexPen 14 units daily as prescribed Glucometer prescribed for close monitoring of blood glucose levels Educated on carb restricted diet Continue other home medications as prior  Home Health: None  Equipment/Devices: None  Discharge Condition:Stable  CODE STATUS: Full  Diet recommendation: Heart Healthy/carb modified  Brief/Interim Summary: Cameron Gutierrez is a 77 y.o. male with medical history significant of hypertension, hyperlipidemia, no prior history of diabetes who presents to the emergency department due to 1 week onset of excessive thirst, urination, dry mouth, lightheadedness which was worse on standing and generalized fatigue.  Patient lives with granddaughter who has diabetes and asked her to help check his blood glucose level which on checking read " high".  He was admitted with hyperglycemia in the setting of new onset type 2 diabetes and was treated with insulin drip and IV fluids for improved control of his blood glucose levels.  His hemoglobin A1c was noted to be 11.8% and he is now recommended to start insulin at home until his blood glucose can be better controlled at which point he may transition to oral agents.  He has been educated on dietary restrictions that will be necessary to help control his blood glucose and has been prescribed a glucometer with recommendations to use this daily.  No other acute events or concerns noted throughout the course of this admission and he is otherwise stable for discharge.  Discharge Diagnoses:  Principal Problem:   Hyperglycemia Active Problems:   Pseudohyponatremia   Chronic kidney disease, stage 3b (HCC)   Essential hypertension    Mixed hyperlipidemia   History of atrial fibrillation  Principal discharge diagnosis: Symptomatic hyperglycemia in the setting of new onset type 2 diabetes.  Discharge Instructions  Discharge Instructions     Ambulatory referral to Nutrition and Diabetic Education   Complete by: As directed    New onset DM   Diet - low sodium heart healthy   Complete by: As directed    Increase activity slowly   Complete by: As directed       Allergies as of 03/27/2022   No Known Allergies      Medication List     TAKE these medications    amLODipine 10 MG tablet Commonly known as: NORVASC Take 10 mg by mouth daily.   atorvastatin 40 MG tablet Commonly known as: LIPITOR Take 40 mg by mouth every morning.   blood glucose meter kit and supplies Kit Dispense based on patient and insurance preference. Use up to four times daily as directed.   Eliquis 5 MG Tabs tablet Generic drug: apixaban Take 5 mg by mouth 2 (two) times daily.   insulin detemir 100 UNIT/ML FlexPen Commonly known as: LEVEMIR Inject 14 Units into the skin daily.   lisinopril 5 MG tablet Commonly known as: ZESTRIL Take 5 mg by mouth every morning.   Pen Needles 3/16" 31G X 5 MM Misc Take as directed with insulin pen.        Follow-up Information     Clinic, [facility]. Schedule an appointment as soon as possible for a visit in 1 week(s).   Specialty: Family Medicine Contact information: [address] [place] [phone]  No Known Allergies  Consultations: None   Procedures/Studies: No results found.   Discharge Exam: Vitals:   03/27/22 1126 03/27/22 1200  BP: 115/74 114/73  Pulse: 69 77  Resp: 18 15  Temp: 98 F (36.7 C)   SpO2: 98% 96%   Vitals:   03/27/22 0800 03/27/22 1100 03/27/22 1126 03/27/22 1200  BP: 114/76 115/74 115/74 114/73  Pulse: 63 75 69 77  Resp: 15 15 18 15   Temp:   98 F (36.7 C)   TempSrc:   Oral   SpO2: 95% 99%  98% 96%  Weight:      Height:        General: Pt is alert, awake, not in acute distress Cardiovascular: RRR, S1/S2 +, no rubs, no gallops Respiratory: CTA bilaterally, no wheezing, no rhonchi Abdominal: Soft, NT, ND, bowel sounds + Extremities: no edema, no cyanosis    The results of significant diagnostics from this hospitalization (including imaging, microbiology, ancillary and laboratory) are listed below for reference.     Microbiology: No results found for this or any previous visit (from the past 240 hour(s)).   Labs: BNP (last 3 results) No results for input(s): "BNP" in the last 8760 hours. Basic Metabolic Panel: Recent Labs  Lab 03/26/22 2033 03/27/22 0549  NA 126* 136  K 4.3 3.5  CL 94* 104  CO2 21* 22  GLUCOSE 697* 140*  BUN 27* 20  CREATININE 1.68* 1.16  CALCIUM 9.4 8.7*  MG  --  2.2  PHOS  --  3.4   Liver Function Tests: Recent Labs  Lab 03/27/22 0549  AST 19  ALT 20  ALKPHOS 67  BILITOT 0.8  PROT 6.0*  ALBUMIN 3.3*   No results for input(s): "LIPASE", "AMYLASE" in the last 168 hours. No results for input(s): "AMMONIA" in the last 168 hours. CBC: Recent Labs  Lab 03/26/22 2033 03/27/22 0549  WBC 5.4 5.3  HGB 14.6 14.4  HCT 43.1 43.2  MCV 83.2 84.9  PLT 202 126*   Cardiac Enzymes: No results for input(s): "CKTOTAL", "CKMB", "CKMBINDEX", "TROPONINI" in the last 168 hours. BNP: Invalid input(s): "POCBNP" CBG: Recent Labs  Lab 03/27/22 0637 03/27/22 0730 03/27/22 0840 03/27/22 1026 03/27/22 1205  GLUCAP 181* 189* 169* 305* 262*   D-Dimer No results for input(s): "DDIMER" in the last 72 hours. Hgb A1c Recent Labs    03/27/22 0549  HGBA1C 11.8*   Lipid Profile No results for input(s): "CHOL", "HDL", "LDLCALC", "TRIG", "CHOLHDL", "LDLDIRECT" in the last 72 hours. Thyroid function studies No results for input(s): "TSH", "T4TOTAL", "T3FREE", "THYROIDAB" in the last 72 hours.  Invalid input(s): "FREET3" Anemia work up No  results for input(s): "VITAMINB12", "FOLATE", "FERRITIN", "TIBC", "IRON", "RETICCTPCT" in the last 72 hours. Urinalysis    Component Value Date/Time   COLORURINE STRAW (A) 03/26/2022 2017   APPEARANCEUR CLEAR 03/26/2022 2017   LABSPEC 1.024 03/26/2022 2017   PHURINE 5.0 03/26/2022 2017   GLUCOSEU >=500 (A) 03/26/2022 2017   HGBUR SMALL (A) 03/26/2022 2017   BILIRUBINUR NEGATIVE 03/26/2022 2017   BILIRUBINUR negative 04/09/2021 1404   KETONESUR NEGATIVE 03/26/2022 2017   PROTEINUR NEGATIVE 03/26/2022 2017   UROBILINOGEN 0.2 04/09/2021 1404   NITRITE NEGATIVE 03/26/2022 2017   LEUKOCYTESUR SMALL (A) 03/26/2022 2017   Sepsis Labs Recent Labs  Lab 03/26/22 2033 03/27/22 0549  WBC 5.4 5.3   Microbiology No results found for this or any previous visit (from the past 240 hour(s)).   Time coordinating discharge: 35 minutes  SIGNED:   Erick Blinks, DO [facility] 03/27/2022, 1:07 PM  If 7PM-7AM, please contact night-coverage [email or web address]

## 2022-07-16 LAB — EXTERNAL GENERIC LAB PROCEDURE

## 2022-09-26 ENCOUNTER — Emergency Department: Payer: Medicare Other

## 2022-09-26 ENCOUNTER — Other Ambulatory Visit: Payer: Self-pay

## 2022-09-26 ENCOUNTER — Observation Stay: Payer: Medicare Other

## 2022-09-26 ENCOUNTER — Inpatient Hospital Stay
Admission: EM | Admit: 2022-09-26 | Discharge: 2022-10-13 | DRG: 444 | Disposition: A | Discharge status: Hospice | Payer: Medicare Other | Attending: Internal Medicine | Admitting: Internal Medicine

## 2022-09-26 DIAGNOSIS — R1084 Generalized abdominal pain: Secondary | ICD-10-CM | POA: Diagnosis not present

## 2022-09-26 DIAGNOSIS — E876 Hypokalemia: Secondary | ICD-10-CM | POA: Diagnosis present

## 2022-09-26 DIAGNOSIS — N179 Acute kidney failure, unspecified: Secondary | ICD-10-CM | POA: Diagnosis not present

## 2022-09-26 DIAGNOSIS — B952 Enterococcus as the cause of diseases classified elsewhere: Secondary | ICD-10-CM | POA: Diagnosis not present

## 2022-09-26 DIAGNOSIS — E1022 Type 1 diabetes mellitus with diabetic chronic kidney disease: Secondary | ICD-10-CM | POA: Diagnosis present

## 2022-09-26 DIAGNOSIS — C259 Malignant neoplasm of pancreas, unspecified: Secondary | ICD-10-CM | POA: Diagnosis not present

## 2022-09-26 DIAGNOSIS — N182 Chronic kidney disease, stage 2 (mild): Secondary | ICD-10-CM

## 2022-09-26 DIAGNOSIS — Z7401 Bed confinement status: Secondary | ICD-10-CM

## 2022-09-26 DIAGNOSIS — J9601 Acute respiratory failure with hypoxia: Secondary | ICD-10-CM | POA: Diagnosis not present

## 2022-09-26 DIAGNOSIS — C787 Secondary malignant neoplasm of liver and intrahepatic bile duct: Secondary | ICD-10-CM | POA: Diagnosis present

## 2022-09-26 DIAGNOSIS — J9811 Atelectasis: Secondary | ICD-10-CM | POA: Diagnosis present

## 2022-09-26 DIAGNOSIS — I728 Aneurysm of other specified arteries: Secondary | ICD-10-CM | POA: Diagnosis present

## 2022-09-26 DIAGNOSIS — I700 Atherosclerosis of aorta: Secondary | ICD-10-CM | POA: Diagnosis present

## 2022-09-26 DIAGNOSIS — Z794 Long term (current) use of insulin: Secondary | ICD-10-CM

## 2022-09-26 DIAGNOSIS — N1832 Chronic kidney disease, stage 3b: Secondary | ICD-10-CM | POA: Diagnosis present

## 2022-09-26 DIAGNOSIS — K828 Other specified diseases of gallbladder: Secondary | ICD-10-CM | POA: Diagnosis present

## 2022-09-26 DIAGNOSIS — Z515 Encounter for palliative care: Secondary | ICD-10-CM

## 2022-09-26 DIAGNOSIS — R188 Other ascites: Secondary | ICD-10-CM | POA: Diagnosis present

## 2022-09-26 DIAGNOSIS — E43 Unspecified severe protein-calorie malnutrition: Secondary | ICD-10-CM | POA: Diagnosis present

## 2022-09-26 DIAGNOSIS — R339 Retention of urine, unspecified: Secondary | ICD-10-CM | POA: Diagnosis present

## 2022-09-26 DIAGNOSIS — Z6827 Body mass index (BMI) 27.0-27.9, adult: Secondary | ICD-10-CM

## 2022-09-26 DIAGNOSIS — I4821 Permanent atrial fibrillation: Secondary | ICD-10-CM | POA: Diagnosis not present

## 2022-09-26 DIAGNOSIS — Z79899 Other long term (current) drug therapy: Secondary | ICD-10-CM

## 2022-09-26 DIAGNOSIS — Z96651 Presence of right artificial knee joint: Secondary | ICD-10-CM | POA: Diagnosis present

## 2022-09-26 DIAGNOSIS — Z539 Procedure and treatment not carried out, unspecified reason: Secondary | ICD-10-CM | POA: Diagnosis present

## 2022-09-26 DIAGNOSIS — I4891 Unspecified atrial fibrillation: Secondary | ICD-10-CM

## 2022-09-26 DIAGNOSIS — K8001 Calculus of gallbladder with acute cholecystitis with obstruction: Principal | ICD-10-CM | POA: Diagnosis present

## 2022-09-26 DIAGNOSIS — J90 Pleural effusion, not elsewhere classified: Secondary | ICD-10-CM | POA: Diagnosis present

## 2022-09-26 DIAGNOSIS — L0291 Cutaneous abscess, unspecified: Secondary | ICD-10-CM

## 2022-09-26 DIAGNOSIS — Z7901 Long term (current) use of anticoagulants: Secondary | ICD-10-CM

## 2022-09-26 DIAGNOSIS — A419 Sepsis, unspecified organism: Principal | ICD-10-CM | POA: Diagnosis not present

## 2022-09-26 DIAGNOSIS — I871 Compression of vein: Secondary | ICD-10-CM | POA: Diagnosis present

## 2022-09-26 DIAGNOSIS — K810 Acute cholecystitis: Secondary | ICD-10-CM

## 2022-09-26 DIAGNOSIS — C7800 Secondary malignant neoplasm of unspecified lung: Secondary | ICD-10-CM | POA: Diagnosis present

## 2022-09-26 DIAGNOSIS — E871 Hypo-osmolality and hyponatremia: Secondary | ICD-10-CM | POA: Diagnosis present

## 2022-09-26 DIAGNOSIS — I129 Hypertensive chronic kidney disease with stage 1 through stage 4 chronic kidney disease, or unspecified chronic kidney disease: Secondary | ICD-10-CM | POA: Diagnosis present

## 2022-09-26 DIAGNOSIS — R64 Cachexia: Secondary | ICD-10-CM | POA: Diagnosis present

## 2022-09-26 DIAGNOSIS — K651 Peritoneal abscess: Secondary | ICD-10-CM | POA: Diagnosis not present

## 2022-09-26 DIAGNOSIS — D509 Iron deficiency anemia, unspecified: Secondary | ICD-10-CM | POA: Diagnosis present

## 2022-09-26 DIAGNOSIS — R54 Age-related physical debility: Secondary | ICD-10-CM | POA: Diagnosis present

## 2022-09-26 DIAGNOSIS — Z66 Do not resuscitate: Secondary | ICD-10-CM | POA: Diagnosis not present

## 2022-09-26 DIAGNOSIS — E7800 Pure hypercholesterolemia, unspecified: Secondary | ICD-10-CM | POA: Diagnosis present

## 2022-09-26 DIAGNOSIS — R6521 Severe sepsis with septic shock: Secondary | ICD-10-CM | POA: Diagnosis not present

## 2022-09-26 HISTORY — DX: Unspecified atrial fibrillation: I48.91

## 2022-09-26 HISTORY — DX: Type 2 diabetes mellitus without complications: E11.9

## 2022-09-26 HISTORY — DX: Malignant (primary) neoplasm, unspecified: C80.1

## 2022-09-26 LAB — COMPREHENSIVE METABOLIC PANEL
ALT: 24 U/L (ref 0–44)
AST: 32 U/L (ref 15–41)
Albumin: 2.2 g/dL — ABNORMAL LOW (ref 3.5–5.0)
Alkaline Phosphatase: 113 U/L (ref 38–126)
Anion gap: 10 (ref 5–15)
BUN: 15 mg/dL (ref 8–23)
CO2: 25 mmol/L (ref 22–32)
Calcium: 7.4 mg/dL — ABNORMAL LOW (ref 8.9–10.3)
Chloride: 98 mmol/L (ref 98–111)
Creatinine, Ser: 0.97 mg/dL (ref 0.61–1.24)
GFR, Estimated: >60 mL/min (ref >60)
Glucose, Bld: 148 mg/dL — ABNORMAL HIGH (ref 70–99)
Potassium: 3.4 mmol/L — ABNORMAL LOW (ref 3.5–5.1)
Sodium: 133 mmol/L — ABNORMAL LOW (ref 135–145)
Total Bilirubin: 1.1 mg/dL (ref 0.3–1.2)
Total Protein: 5.4 g/dL — ABNORMAL LOW (ref 6.5–8.1)

## 2022-09-26 LAB — CBC
HCT: 41.3 % (ref 39.0–52.0)
Hemoglobin: 13.4 g/dL (ref 13.0–17.0)
MCH: 27.9 pg (ref 26.0–34.0)
MCHC: 32.4 g/dL (ref 30.0–36.0)
MCV: 86 fL (ref 80.0–100.0)
Platelets: 337 10*3/uL (ref 150–400)
RBC: 4.8 MIL/uL (ref 4.22–5.81)
RDW: 13.4 % (ref 11.5–15.5)
WBC: 15.7 10*3/uL — ABNORMAL HIGH (ref 4.0–10.5)
nRBC: 0 % (ref 0.0–0.2)

## 2022-09-26 LAB — CBG MONITORING, ED: Glucose-Capillary: 152 mg/dL — ABNORMAL HIGH (ref 70–99)

## 2022-09-26 LAB — GLUCOSE, CAPILLARY
Glucose-Capillary: 144 mg/dL — ABNORMAL HIGH (ref 70–99)
Glucose-Capillary: 127 mg/dL — ABNORMAL HIGH (ref 70–99)

## 2022-09-26 LAB — TROPONIN I (HIGH SENSITIVITY): Troponin I (High Sensitivity): 6 ng/L (ref <18)

## 2022-09-26 LAB — LIPASE, BLOOD: Lipase: 19 U/L (ref 11–51)

## 2022-09-26 MED ORDER — OXYCODONE HCL 5 MG PO TABS
10.0000 mg | ORAL_TABLET | ORAL | Status: DC | PRN
  Administered 2022-09-27 – 2022-10-12 (×9): 10 mg via ORAL
  Filled 2022-09-26 (×9): qty 2

## 2022-09-26 MED ORDER — SODIUM CHLORIDE 0.9 % IV BOLUS
1000.0000 mL | Freq: Once | INTRAVENOUS | Status: AC
  Administered 2022-09-26: 1000 mL via INTRAVENOUS

## 2022-09-26 MED ORDER — ADULT MULTIVITAMIN W/MINERALS CH
1.0000 | ORAL_TABLET | Freq: Every day | ORAL | Status: DC
  Administered 2022-09-30 – 2022-10-12 (×12): 1 via ORAL
  Filled 2022-09-26 (×13): qty 1

## 2022-09-26 MED ORDER — SODIUM CHLORIDE 0.9 % IV SOLN: INTRAVENOUS | Status: DC

## 2022-09-26 MED ORDER — TRAZODONE HCL 50 MG PO TABS
25.0000 mg | ORAL_TABLET | Freq: Every evening | ORAL | Status: DC | PRN
  Administered 2022-09-26 – 2022-10-05 (×5): 25 mg via ORAL
  Filled 2022-09-26 (×6): qty 1

## 2022-09-26 MED ORDER — GLUCERNA SHAKE PO LIQD: 237.0000 mL | Freq: Three times a day (TID) | ORAL | Status: DC

## 2022-09-26 MED ORDER — HYDROMORPHONE HCL 1 MG/ML IJ SOLN
0.5000 mg | INTRAMUSCULAR | Status: DC | PRN
  Administered 2022-09-26 (×3): 0.5 mg via INTRAVENOUS
  Filled 2022-09-26 (×3): qty 0.5

## 2022-09-26 MED ORDER — MORPHINE SULFATE (PF) 2 MG/ML IV SOLN
2.0000 mg | INTRAVENOUS | Status: DC | PRN
  Administered 2022-09-28: 2 mg via INTRAVENOUS
  Filled 2022-09-26: qty 1

## 2022-09-26 MED ORDER — METOPROLOL TARTRATE 5 MG/5ML IV SOLN
5.0000 mg | INTRAVENOUS | Status: DC | PRN
  Administered 2022-09-28: 5 mg via INTRAVENOUS

## 2022-09-26 MED ORDER — DOCUSATE SODIUM 100 MG PO CAPS
100.0000 mg | ORAL_CAPSULE | Freq: Two times a day (BID) | ORAL | Status: DC
  Administered 2022-09-26 – 2022-10-09 (×9): 100 mg via ORAL
  Filled 2022-09-26 (×14): qty 1

## 2022-09-26 MED ORDER — ENSURE ENLIVE PO LIQD
237.0000 mL | Freq: Three times a day (TID) | ORAL | Status: DC
  Administered 2022-09-26 – 2022-10-11 (×19): 237 mL via ORAL

## 2022-09-26 MED ORDER — ATORVASTATIN CALCIUM 20 MG PO TABS
40.0000 mg | ORAL_TABLET | Freq: Every morning | ORAL | Status: DC
  Administered 2022-09-26 – 2022-10-12 (×14): 40 mg via ORAL
  Filled 2022-09-26 (×15): qty 2

## 2022-09-26 MED ORDER — TRAMADOL HCL 50 MG PO TABS
50.0000 mg | ORAL_TABLET | Freq: Four times a day (QID) | ORAL | Status: DC | PRN
  Administered 2022-09-27 (×2): 50 mg via ORAL
  Filled 2022-09-26 (×3): qty 1

## 2022-09-26 MED ORDER — ONDANSETRON HCL 4 MG PO TABS: 4.0000 mg | ORAL_TABLET | Freq: Four times a day (QID) | ORAL | Status: DC | PRN

## 2022-09-26 MED ORDER — IOHEXOL 300 MG/ML  SOLN
80.0000 mL | Freq: Once | INTRAMUSCULAR | Status: AC | PRN
  Administered 2022-09-26: 80 mL via INTRAVENOUS

## 2022-09-26 MED ORDER — ENSURE ENLIVE PO LIQD: 237.0000 mL | Freq: Two times a day (BID) | ORAL | Status: DC

## 2022-09-26 MED ORDER — MORPHINE SULFATE (PF) 4 MG/ML IV SOLN
4.0000 mg | Freq: Once | INTRAVENOUS | Status: AC
  Administered 2022-09-26: 4 mg via INTRAVENOUS
  Filled 2022-09-26: qty 1

## 2022-09-26 MED ORDER — INSULIN ASPART 100 UNIT/ML IJ SOLN: 0.0000 [IU] | Freq: Every day | INTRAMUSCULAR | Status: DC

## 2022-09-26 MED ORDER — METOPROLOL TARTRATE 25 MG PO TABS
25.0000 mg | ORAL_TABLET | Freq: Two times a day (BID) | ORAL | Status: DC
  Administered 2022-09-26 (×2): 25 mg via ORAL
  Filled 2022-09-26 (×2): qty 1

## 2022-09-26 MED ORDER — APIXABAN 5 MG PO TABS
5.0000 mg | ORAL_TABLET | Freq: Two times a day (BID) | ORAL | Status: DC
  Administered 2022-09-26 (×2): 5 mg via ORAL
  Filled 2022-09-26 (×2): qty 1

## 2022-09-26 MED ORDER — POLYETHYLENE GLYCOL 3350 17 G PO PACK
17.0000 g | PACK | Freq: Two times a day (BID) | ORAL | Status: DC
  Administered 2022-09-26 (×2): 17 g via ORAL
  Filled 2022-09-26 (×3): qty 1

## 2022-09-26 MED ORDER — ONDANSETRON HCL 4 MG/2ML IJ SOLN
4.0000 mg | Freq: Four times a day (QID) | INTRAMUSCULAR | Status: DC | PRN
  Administered 2022-09-26 – 2022-09-28 (×5): 4 mg via INTRAVENOUS
  Filled 2022-09-26 (×5): qty 2

## 2022-09-26 MED ORDER — INSULIN ASPART 100 UNIT/ML IJ SOLN
0.0000 [IU] | Freq: Three times a day (TID) | INTRAMUSCULAR | Status: DC
  Administered 2022-09-26 – 2022-09-27 (×3): 1 [IU] via SUBCUTANEOUS
  Administered 2022-09-28 – 2022-09-29 (×3): 2 [IU] via SUBCUTANEOUS
  Administered 2022-09-29: 1 [IU] via SUBCUTANEOUS
  Filled 2022-09-26 (×8): qty 1

## 2022-09-26 NOTE — Plan of Care (Signed)
  Problem: Education: Goal: Knowledge of General Education information will improve Description: Including pain rating scale, medication(s)/side effects and non-pharmacologic comfort measures Outcome: Progressing   Problem: Activity: Goal: Risk for activity intolerance will decrease Outcome: Progressing   Problem: Elimination: Goal: Will not experience complications related to bowel motility Outcome: Progressing Goal: Will not experience complications related to urinary retention Outcome: Progressing   Problem: Pain Managment: Goal: General experience of comfort will improve Outcome: Progressing   Problem: Safety: Goal: Ability to remain free from injury will improve Outcome: Progressing   Problem: Skin Integrity: Goal: Risk for impaired skin integrity will decrease Outcome: Progressing   

## 2022-09-26 NOTE — ED Triage Notes (Signed)
Central chest pain described as a stabbing. Onset 1 hr pta. Denies h/a, dizziness, n/v. Associated SOB. Pt alert and oriented. Breathing with symmetric chest rise and fall. Pt speaking in full sentences.

## 2022-09-26 NOTE — Progress Notes (Signed)
Initial Nutrition Assessment  DOCUMENTATION CODES:   Not applicable  INTERVENTION:   -Ensure Enlive po TID, each supplement provides 350 kcal and 20 grams of protein.  -Magic cup TID with meals, each supplement provides 290 kcal and 9 grams of protein  -MVI with minerals daily   NUTRITION DIAGNOSIS:   Increased nutrient needs related to chronic illness (newly diagnosed pancreatic cancer) as evidenced by estimated needs.  GOAL:   Patient will meet greater than or equal to 90% of their needs  MONITOR:   PO intake, Supplement acceptance, Diet advancement  REASON FOR ASSESSMENT:   Consult Assessment of nutrition requirement/status  ASSESSMENT:   Pt with medical history significant of adult onset type I diabetes follows with [facility] endocrinology, hypertension, CKD stage II, hyperlipidemia, a fib on eliquis presents with abdominal pain which started the morning of admission.  Pt admitted with abdominal pain diffuse suspected due to recent ERCP with stent placement and new diagnosis of primary pancreatic adenocarcinoma with metastasis    Pt unavailable at time of visit. Attempted to speak with pt via call to hospital room phone, however, unable to reach. RD unable to obtain further nutrition-related history or complete nutrition-focused physical exam at this time.    Per MD notes, pt was hospitalized at [facility] on 7/8-7/12/24; work-up showed metastatic pancreatic cancer. Pt underwent ERCP/US with biopsy and stent placement on 09/24/22. Pt desires to follow-up with [facility] oncology appointment later this month.   Pt currently on a full liquid diet. No meal completion data available to assess at this time.   Reviewed wt hx; pt has experienced a 18.5% wt loss over the past 6 months, which is significant for time frame. RD highly suspects malnutrition, however, unable to identify at this time.   Pt would greatly benefit from addition of oral nutrition supplements.   Medications reviewed and  include colace, miralax, and 0.9% sodium chloride infusion @ 75 ml/hr.   Lab Results  Component Value Date   HGBA1C 11.8 (H) 03/27/2022   PTA DM medications are 18 units insulin glargine daily. Suspect insulin regimen insufficient PTA secondary to know type 1 DM and pancreatic cancer diagnosis. Suspect uncontrolled DM may also be contributing to weight loss (as well as cancer diagnosis). Per [facility] endo notes on 08/07/22, last prescribed DM regimen was 18 units insulin glargine daily., 5 mg glipizide daily, and 3 units insulin aspart TID with meals. 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 Endo questions MODY due to family history of adult onset insulin dependent DM. Suspect pancreatic cancer diagnosis may also be contributing.   Labs reviewed: Na: 133, K: 3.4, CBGS: 152 (inpatient orders for glycemic control are 0-5 units insulin aspart daily at bedtime and 0-9 units insulin aspart TID with meals).    Diet Order:   Diet Order             Diet full liquid Room service appropriate? Yes; Fluid consistency: Thin  Diet effective now                   EDUCATION NEEDS:   No education needs have been identified at this time  Skin:  Skin Assessment: Reviewed RN Assessment  Last BM:  Unknown  Height:   Ht Readings from Last 1 Encounters:  09/26/22 6' (1.829 m)    Weight:   Wt Readings from Last 1 Encounters:  09/26/22 73.9 kg    Ideal Body Weight:  80.9 kg  BMI:  Body mass index is  22.11 kg/m.  Estimated Nutritional Needs:   Kcal:  2200-2400  Protein:  110-125 grams  Fluid:  > 2 L    Levada Schilling, RD, LDN, CDCES Registered Dietitian II Certified Diabetes Care and Education Specialist Please refer to [facility] for RD and/or RD on-call/weekend/after hours pager

## 2022-09-26 NOTE — ED Notes (Signed)
Patient is vol pending placement 

## 2022-09-26 NOTE — ED Provider Notes (Signed)
Patient received in signout from Dr. Derrill Kay pending follow-up CT imaging.  CT imaging with pancreatic mass and metastatic disease pneumobilia which would be expected post ERCP.  Some gallbladder distention on CT but no findings of cholecystitis per radiology.  Patient with persistent pain.  I consulted with Dr. Everardo All oncology at [facility].  After review of imaging labs not felt to require transfer to to their facility.  Given the patient's intractable pain have consulted hospitalist for admission.   Willy Eddy, MD 09/26/22 1146

## 2022-09-26 NOTE — ED Notes (Signed)
US at bedside

## 2022-09-26 NOTE — ED Provider Notes (Signed)
[facility] Provider Note    Event Date/Time   First MD Initiated Contact with Patient 09/26/22 [phone]     (approximate)   History   Chest Pain   HPI  Cameron Gutierrez is a 77 y.o. male who presents to the emergency department today because of concerns for abdominal pain and chest pain.  He states that the pain started last night.  It was severe.  He denies any associated nausea vomiting or fevers.  The patient was recently diagnosed with pancreatic cancer.  Apparently was discharged with oxycodone which did not help with the pain tonight. During his recent admission at outside hospital did undergo ERCP with biopsy and stenting.      Physical Exam   Triage Vital Signs: ED Triage Vitals  Encounter Vitals Group     BP 09/26/22 0521 (!) 160/94     Systolic BP Percentile --      Diastolic BP Percentile --      Pulse Rate 09/26/22 0521 82     Resp 09/26/22 0521 (!) 22     Temp 09/26/22 0521 (!) 97.5 F (36.4 C)     Temp Source 09/26/22 0521 Oral     SpO2 09/26/22 0521 95 %     Weight 09/26/22 0519 163 lb (73.9 kg)     Height 09/26/22 0519 6' (1.829 m)     Head Circumference --      Peak Flow --      Pain Score 09/26/22 0519 10     Pain Loc --      Pain Education --      Exclude from Growth Chart --     Most recent vital signs: Vitals:   09/26/22 0521  BP: (!) 160/94  Pulse: 82  Resp: (!) 22  Temp: (!) 97.5 F (36.4 C)  SpO2: 95%   General: Awake, alert, uncomfortable. CV:  Good peripheral perfusion. Regular rate and rhythm. Resp:  Normal effort. Lungs clear. Abd:  No distention. Diffusely tender to palpation   ED Results / Procedures / Treatments   Labs (all labs ordered are listed, but only abnormal results are displayed) Labs Reviewed  CBG MONITORING, ED - Abnormal; Notable for the following components:      Result Value   Glucose-Capillary 152 (*)    All other components within normal limits  CBC  COMPREHENSIVE METABOLIC PANEL   TROPONIN I (HIGH SENSITIVITY)     EKG  I, Phineas Semen, attending physician, personally viewed and interpreted this EKG  EKG Time: 0542 Rate: 62 Rhythm: sinus rhythm with 1st degree av block Axis: normal Intervals: qtc 401 QRS: narrow ST changes: no st elevation Impression: abnormal ekg    RADIOLOGY CT abd/pel  pending at time of sign out   PROCEDURES:  Critical Care performed: No    MEDICATIONS ORDERED IN ED: Medications - No data to display   IMPRESSION / MDM / ASSESSMENT AND PLAN / ED COURSE  I reviewed the triage vital signs and the nursing notes.                              Differential diagnosis includes, but is not limited to, neoplasm, obstruction, infection, procedure complication  Patient's presentation is most consistent with acute presentation with potential threat to life or bodily function.   The patient is on the cardiac monitor to evaluate for evidence of arrhythmia and/or significant heart rate changes.  Patient presented to the emergency department today with concerns for both abdominal and chest pain.  Patient with recent diagnosis of pancreatic cancer who underwent ERCP with stenting and biopsy 3 days ago.  On exam patient appears uncomfortable.  Abdomen is diffusely tender to palpation.  Will give IV pain medication.  Will obtain blood work and CT scan.      FINAL CLINICAL IMPRESSION(S) / ED DIAGNOSES   Final diagnoses:  Generalized abdominal pain      Note:  This document was prepared using Dragon voice recognition software and may include unintentional dictation errors.    Phineas Semen, MD 09/26/22 [phone]

## 2022-09-26 NOTE — H&P (Signed)
History and Physical    Patient: Cameron Gutierrez DOB: 06-18-1945 DOA: 09/26/2022 DOS: the patient was seen and examined on 09/26/2022 PCP: Dondra Spry, MD  Patient coming from: Home  Chief Complaint:  Abdominal pain diffuse since 4 am  HPI: Cameron Gutierrez is a 77 y.o. male with medical history significant of adult onset type I diabetes follows with [facility] endocrinology, hypertension, CKD stage II, hyperlipidemia, a fib on eliquis presents to the emergency room with abdominal pain which started early hours of this morning at 4 o'clock.  Patient was recently admitted at [facility] from 09/21/22--09/25/22 when he went there for abdominal pain and unintentional weight loss and workup showed patient has primary metastatic pancreatic adenocarcinoma.  Patient underwent ERCP/US with biopsy and stent placement on July 11 at [facility]. He was discharge yesterday with oral oxycodone. Patient was tolerating some diet. Came in after he started having severe abdominal pain started around 4 o'clock. Denies any vomiting.  In the ER patient received 4 mg of IV morphine. He is tachycardic. Blood pressure otherwise stable. Wife is at bedside.  Patient has follow-up appointment with surgical oncology at [facility] on July 22. I discussed with patient and wife if they would want oncology to follow here they said they would want to keep that appointment at [facility] for now.  pt is being admitted for  pain control.  Abd CT scan: Large pancreatic head mass compatible with the patient's history of pancreatic cancer. The mass extends cephalad and involves the caudate lobe. There is also a subcapsular fluid collection or implant cephalad to the caudate lobe causing mass effect on the adjacent IVC. 2. Multiple liver lesions, abdominal lymphadenopathy and multiple pulmonary nodules. Findings compatible with metastatic disease. 3. Metallic biliary stent in the common bile duct and there is a small amount of  pneumobilia. No significant intrahepatic biliary dilatation. In addition, there is moderate distention of the gallbladder containing iodinated contrast and small amount of gas. Findings compatible with recent ERCP. Gallbladder distension could be a source of abdominal pain. 4. Bilateral small pleural effusions with compressive atelectasis in the lower lobes. 5. Small amount of free fluid in the abdomen and pelvis. 6. Colonic diverticulosis without evidence for acute diverticulitis. 7. Aortic Atherosclerosis (ICD10-I70.0). 8. 9 mm splenic artery aneurysm.    Review of Systems: As mentioned in the history of present illness. All other systems reviewed and are negative. Past Medical History:  Diagnosis Date   Atrial fibrillation (HCC)    Cancer (HCC)    pancreatic   Diabetes mellitus without complication (HCC)    Hypercholesteremia    Hypertension    Past Surgical History:  Procedure Laterality Date   KNEE SURGERY     REPLACEMENT TOTAL KNEE Right    SHOULDER SURGERY     Social History:  reports that he has never smoked. He has never used smokeless tobacco. He reports that he does not currently use alcohol. He reports that he does not use drugs.  No Known Allergies  History reviewed. No pertinent family history.  Prior to Admission medications   Medication Sig Start Date End Date Taking? Authorizing Provider  amLODipine (NORVASC) 10 MG tablet Take 10 mg by mouth daily. 03/20/21   [provider]  atorvastatin (LIPITOR) 40 MG tablet Take 40 mg by mouth every morning. 03/20/21   [provider]  blood glucose meter kit and supplies KIT Dispense based on patient and insurance preference. Use up to four times daily as directed.  03/27/22   Sherryll Burger, Pratik D, DO  ELIQUIS 5 MG TABS tablet Take 5 mg by mouth 2 (two) times daily. 03/20/21   [provider]  insulin detemir (LEVEMIR) 100 UNIT/ML FlexPen Inject 14 Units into the skin daily. 03/27/22   Sherryll Burger, Pratik D, DO   Insulin Pen Needle (PEN NEEDLES 3/16") 31G X 5 MM MISC Take as directed with insulin pen. 03/27/22   Sherryll Burger, Pratik D, DO  lisinopril (ZESTRIL) 5 MG tablet Take 5 mg by mouth every morning. 03/02/21   [provider]    Physical Exam: Vitals:   09/26/22 1000 09/26/22 1045 09/26/22 1100 09/26/22 1115  BP: 109/68  110/74   Pulse: (!) 49 (!) 138 (!) 105   Resp: 19 (!) 21 (!) 23   Temp: 98.2 F (36.8 C)     TempSrc: Oral     SpO2: 92% (!) 89% 90% 94%  Weight:      Height:       Physical Exam Constitutional:      Appearance: He is well-developed.  HENT:     Head: Normocephalic.  Eyes:     Extraocular Movements: Extraocular movements intact.     Pupils: Pupils are equal, round, and reactive to light.  Cardiovascular:     Rate and Rhythm: Regular rhythm. Tachycardia present.     Heart sounds: Normal heart sounds.  Pulmonary:     Effort: Pulmonary effort is normal.     Breath sounds: Normal breath sounds.  Abdominal:     Palpations: Abdomen is soft.     Tenderness: There is abdominal tenderness.  Skin:    General: Skin is warm and dry.  Neurological:     General: No focal deficit present.     Mental Status: He is alert and oriented to person, place, and time.       Cameron Gutierrez is a 77 y.o. male with medical history significant of adult onset type I diabetes follows with [facility] endocrinology, hypertension, CKD stage II, hyperlipidemia, a fib on eliquis presents to the emergency room with abdominal pain which started early hours of this morning at 4 o'clock. Patient was recently admitted at [facility] from 09/21/22--09/25/22 when he went there for abdominal pain and unintentional weight loss and workup showed patient has primary metastatic pancreatic adenocarcinoma.  Abdominal pain diffuse suspected due to recent ERCP with stent placement and new diagnosis of primary pancreatic adenocarcinoma with metastasis -- CT abdomen also shows some gallbladder distention -- will  get ultrasound of the abdomen to evaluate gallbladder -- consider surgery consultation pending ultrasound -- patient's LFTs are stable. -- Will give IV and oral pain meds -- stool softener  Primary metastatic adenocarcinoma of the pancreas -- patient currently had workup done at [facility]. -- Discussed with patient and wife and they would want to follow-up at [facility] from cancer standpoint.  A fib with RVR -- will resume eliquis which was started back on 11th of July after the procedure -- patient is quite tachycardic suspected due to pain and will give beta-blocker  Hypertension -- continue beta-blocker -- hold Norvasc and lisinopril-- home meds  Type I diabetes adult-onset -- patient follows with [facility] endocrinology -- poor appetite lately with weight loss. -- For now sliding scale insulin -- patient takes Lantus  Hyperlipidemia -- continue statins    Advance Care Planning:   Code Status: Full Code d/w pt and wife  Consults: none  Family Communication: wife in ER  Severity of Illness: The appropriate  patient status for this patient is OBSERVATION. Observation status is judged to be reasonable and necessary in order to provide the required intensity of service to ensure the patient's safety. The patient's presenting symptoms, physical exam findings, and initial radiographic and laboratory data in the context of their medical condition is felt to place them at decreased risk for further clinical deterioration. Furthermore, it is anticipated that the patient will be medically stable for discharge from the hospital within 2 midnights of admission.   Author: Enedina Finner, MD 09/26/2022 12:36 PM  For on call review [email or web address].

## 2022-09-27 DIAGNOSIS — C7800 Secondary malignant neoplasm of unspecified lung: Secondary | ICD-10-CM | POA: Diagnosis present

## 2022-09-27 DIAGNOSIS — J90 Pleural effusion, not elsewhere classified: Secondary | ICD-10-CM | POA: Diagnosis present

## 2022-09-27 DIAGNOSIS — N1832 Chronic kidney disease, stage 3b: Secondary | ICD-10-CM | POA: Diagnosis present

## 2022-09-27 DIAGNOSIS — R1084 Generalized abdominal pain: Secondary | ICD-10-CM | POA: Diagnosis present

## 2022-09-27 DIAGNOSIS — N179 Acute kidney failure, unspecified: Secondary | ICD-10-CM | POA: Diagnosis not present

## 2022-09-27 DIAGNOSIS — I4821 Permanent atrial fibrillation: Secondary | ICD-10-CM | POA: Diagnosis present

## 2022-09-27 DIAGNOSIS — K810 Acute cholecystitis: Secondary | ICD-10-CM | POA: Diagnosis not present

## 2022-09-27 DIAGNOSIS — I4891 Unspecified atrial fibrillation: Secondary | ICD-10-CM | POA: Diagnosis not present

## 2022-09-27 DIAGNOSIS — C259 Malignant neoplasm of pancreas, unspecified: Secondary | ICD-10-CM

## 2022-09-27 DIAGNOSIS — I129 Hypertensive chronic kidney disease with stage 1 through stage 4 chronic kidney disease, or unspecified chronic kidney disease: Secondary | ICD-10-CM | POA: Diagnosis present

## 2022-09-27 DIAGNOSIS — C787 Secondary malignant neoplasm of liver and intrahepatic bile duct: Secondary | ICD-10-CM | POA: Diagnosis present

## 2022-09-27 DIAGNOSIS — Z66 Do not resuscitate: Secondary | ICD-10-CM | POA: Diagnosis not present

## 2022-09-27 DIAGNOSIS — R64 Cachexia: Secondary | ICD-10-CM | POA: Diagnosis present

## 2022-09-27 DIAGNOSIS — Z515 Encounter for palliative care: Secondary | ICD-10-CM | POA: Diagnosis not present

## 2022-09-27 DIAGNOSIS — J9811 Atelectasis: Secondary | ICD-10-CM | POA: Diagnosis present

## 2022-09-27 DIAGNOSIS — D509 Iron deficiency anemia, unspecified: Secondary | ICD-10-CM | POA: Diagnosis present

## 2022-09-27 DIAGNOSIS — I728 Aneurysm of other specified arteries: Secondary | ICD-10-CM | POA: Diagnosis present

## 2022-09-27 DIAGNOSIS — A419 Sepsis, unspecified organism: Secondary | ICD-10-CM | POA: Insufficient documentation

## 2022-09-27 DIAGNOSIS — E1122 Type 2 diabetes mellitus with diabetic chronic kidney disease: Secondary | ICD-10-CM | POA: Diagnosis not present

## 2022-09-27 DIAGNOSIS — L0291 Cutaneous abscess, unspecified: Secondary | ICD-10-CM | POA: Diagnosis not present

## 2022-09-27 DIAGNOSIS — K651 Peritoneal abscess: Secondary | ICD-10-CM | POA: Diagnosis not present

## 2022-09-27 DIAGNOSIS — E43 Unspecified severe protein-calorie malnutrition: Secondary | ICD-10-CM | POA: Diagnosis present

## 2022-09-27 DIAGNOSIS — I871 Compression of vein: Secondary | ICD-10-CM | POA: Diagnosis present

## 2022-09-27 DIAGNOSIS — K8001 Calculus of gallbladder with acute cholecystitis with obstruction: Secondary | ICD-10-CM | POA: Diagnosis present

## 2022-09-27 DIAGNOSIS — E1022 Type 1 diabetes mellitus with diabetic chronic kidney disease: Secondary | ICD-10-CM | POA: Diagnosis present

## 2022-09-27 DIAGNOSIS — J9601 Acute respiratory failure with hypoxia: Secondary | ICD-10-CM | POA: Diagnosis not present

## 2022-09-27 DIAGNOSIS — R188 Other ascites: Secondary | ICD-10-CM | POA: Diagnosis present

## 2022-09-27 DIAGNOSIS — R6521 Severe sepsis with septic shock: Secondary | ICD-10-CM | POA: Diagnosis not present

## 2022-09-27 DIAGNOSIS — E871 Hypo-osmolality and hyponatremia: Secondary | ICD-10-CM | POA: Diagnosis present

## 2022-09-27 LAB — CBC
HCT: 43.4 % (ref 39.0–52.0)
Hemoglobin: 14.7 g/dL (ref 13.0–17.0)
MCH: 27.8 pg (ref 26.0–34.0)
MCHC: 33.9 g/dL (ref 30.0–36.0)
MCV: 82 fL (ref 80.0–100.0)
Platelets: 341 10*3/uL (ref 150–400)
RBC: 5.29 MIL/uL (ref 4.22–5.81)
RDW: 13.6 % (ref 11.5–15.5)
WBC: 21.3 10*3/uL — ABNORMAL HIGH (ref 4.0–10.5)
nRBC: 0 % (ref 0.0–0.2)

## 2022-09-27 LAB — GLUCOSE, CAPILLARY
Glucose-Capillary: 144 mg/dL — ABNORMAL HIGH (ref 70–99)
Glucose-Capillary: 119 mg/dL — ABNORMAL HIGH (ref 70–99)
Glucose-Capillary: 137 mg/dL — ABNORMAL HIGH (ref 70–99)
Glucose-Capillary: 139 mg/dL — ABNORMAL HIGH (ref 70–99)

## 2022-09-27 LAB — PROTIME-INR
INR: 1.7 — ABNORMAL HIGH (ref 0.8–1.2)
Prothrombin Time: 20.5 seconds — ABNORMAL HIGH (ref 11.4–15.2)

## 2022-09-27 LAB — TROPONIN I (HIGH SENSITIVITY): Troponin I (High Sensitivity): 11 ng/L (ref <18)

## 2022-09-27 MED ORDER — HEPARIN SODIUM (PORCINE) 5000 UNIT/ML IJ SOLN: 5000.0000 [IU] | Freq: Three times a day (TID) | INTRAMUSCULAR | Status: DC

## 2022-09-27 MED ORDER — SODIUM CHLORIDE 0.9 % IV SOLN: 12.5000 mg | Freq: Four times a day (QID) | INTRAVENOUS | Status: DC | PRN

## 2022-09-27 MED ORDER — SODIUM CHLORIDE 0.9 % IV SOLN
3.0000 g | Freq: Four times a day (QID) | INTRAVENOUS | Status: DC
  Administered 2022-09-27 – 2022-09-29 (×8): 3 g via INTRAVENOUS
  Filled 2022-09-27 (×10): qty 8

## 2022-09-27 NOTE — Progress Notes (Signed)
[facility]  - [place] [facility]   PATIENT NAME: Cameron Gutierrez    MR#:  [id number]  DATE OF BIRTH:  November 26, 1945  SUBJECTIVE:  patient seen earlier. Wife at bedside. Complains of significant nausea. Trying to drink some glyceryl not shake ourselves nauseous. Had vomiting last night. Continues to have pain in the right upper quadrant. No fever however wife tells patient is feeling hot and cold.    VITALS:  Blood pressure 90/62, pulse 75, temperature 97.7 F (36.5 C), resp. rate 20, height 6' (1.829 m), weight 73.9 kg, SpO2 92%.  PHYSICAL EXAMINATION:   GENERAL:  77 y.o.-year-old patient with  acute distress. Appears sick LUNGS: Normal breath sounds bilaterally, no wheezing CARDIOVASCULAR: S1, S2 normal. No murmur   ABDOMEN: Soft, right upper quadrant tender, nondistended. EXTREMITIES: No  edema b/l.    NEUROLOGIC: nonfocal  patient is alert and awake, appears weak and debilitated SKIN: No obvious rash, lesion, or ulcer.   LABORATORY PANEL:  CBC Recent Labs  Lab 09/26/22 0538  WBC 15.7*  HGB 13.4  HCT 41.3  PLT 337    Chemistries  Recent Labs  Lab 09/26/22 0655  NA 133*  K 3.4*  CL 98  CO2 25  GLUCOSE 148*  BUN 15  CREATININE 0.97  CALCIUM 7.4*  AST 32  ALT 24  ALKPHOS 113  BILITOT 1.1   Cardiac Enzymes No results for input(s): "TROPONINI" in the last 168 hours. RADIOLOGY:  US Abdomen Limited RUQ (LIVER/GB)  Result Date: 09/26/2022 CLINICAL DATA:  Abdominal pain. History of metastatic pancreatic cancer. EXAM: ULTRASOUND ABDOMEN LIMITED RIGHT UPPER QUADRANT COMPARISON:  CT abdomen pelvis from same day. FINDINGS: Gallbladder: No gallstones. Sludge with mild gallbladder wall thickening and small volume pericholecystic fluid. Positive sonographic Murphy sign noted by sonographer. Common bile duct: Diameter: 4 mm, normal.  Common bile duct stent noted. Liver: Liver lesions seen on CT are not well identified by ultrasound. Within normal limits in  parenchymal echogenicity. Portal vein is patent on color Doppler imaging with normal direction of blood flow towards the liver. Other: None. IMPRESSION: 1. Gallbladder sludge with mild gallbladder wall thickening and small volume pericholecystic fluid. Positive sonographic Murphy sign. Findings are concerning for acute acalculous cholecystitis. 2. Liver metastases seen on CT are not well identified by ultrasound. Electronically Signed   By: Obie Dredge M.D.   On: 09/26/2022 14:56   CT ABDOMEN PELVIS W CONTRAST  Result Date: 09/26/2022 CLINICAL DATA:  Central chest pain. Abdominal pain. History of pancreatic cancer. EXAM: CT ABDOMEN AND PELVIS WITH CONTRAST TECHNIQUE: Multidetector CT imaging of the abdomen and pelvis was performed using the standard protocol following bolus administration of intravenous contrast. RADIATION DOSE REDUCTION: This exam was performed according to the departmental dose-optimization program which includes automated exposure control, adjustment of the mA and/or kV according to patient size and/or use of iterative reconstruction technique. CONTRAST:  80mL OMNIPAQUE IOHEXOL 300 MG/ML  SOLN COMPARISON:  Chest radiograph 09/26/2022. Outside CTs from [facility] dated 09/21/2022 and 09/22/2022. Outside images are unavailable but reports are available. FINDINGS: Lower chest: Multiple small pulmonary nodules in the visualized lungs. Index pulmonary nodule is in the right lower lobe along the right major fissure on image 33/4 measuring up to 1.3 cm. There also may be a larger nodule in the left lower lobe measuring 1.4 cm image 15/4. Volume loss and compressive atelectasis in the left lower lobe. Compressive atelectasis in the right lower lobe. Bilateral small pleural effusions. Hepatobiliary: Multiple ill-defined hypoechoic  liver lesions compatible with metastatic disease. Index lesion in the left hepatic lobe measuring 2.1 cm on image 23/2. There is gas and contrast within the gallbladder. The  gallbladder is moderately distended and findings compatible with recent ERCP. Metallic biliary stent in the common bile duct. Narrowing in the biliary stent associated with the pancreatic mass. Small amount of pneumobilia. Small amount of fluid and stranding around the liver and gallbladder. Pancreas: Large poorly defined pancreatic head mass with multiple hypoechoic components. Difficult to accurately measure the mass itself but it roughly measures 4.0 x 4.9 x 5.1 cm. Dilatation of the main pancreatic duct. The mass extends cephalad and appears to be involving the caudate lobe. There is a caudate lesion measuring up to 4.1 cm. In addition, there is hypoechoic implant or fluid collection cephalad to the caudate lobe involving the hepatic subcapsular space. This subcapsular collection or implant measures 7.8 x 4.6 x 5.6 cm. This was present on the previous exam based on the prior report. This caudate/subcapsular component causing mass effect on the adjacent IVC. Mild stranding and edema throughout the porta hepatis region. Spleen: Small splenic calcifications.  Spleen is normal for size. Adrenals/Urinary Tract: Normal adrenal glands. Bilateral cortical and parapelvic renal cysts. No suspicious renal lesions. No hydronephrosis. Bladder is decompressed. There is probably a small bladder diverticulum along the posterior aspect of the bladder. Cannot exclude mild bladder wall thickening. Asymmetric left perinephric stranding. Stomach/Bowel: Normal appearance of the stomach. No bowel dilatation. Colonic diverticula involving the sigmoid colon. No evidence for acute bowel inflammation. Vascular/Lymphatic: Atherosclerotic disease involving the abdominal aorta without aneurysm. Atherosclerotic calcifications at the origin of the SMA. Atherosclerotic calcifications at the origin of the bilateral renal arteries. IVC is patent but there is compression in the suprarenal IVC due to the subcapsular fluid collection / mass. 9 mm  calcified aneurysm involving the splenic artery. Main portal vein is small but patent. Intrahepatic portal veins are patent. 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 Splenic vein is patent. Pathologic lymphadenopathy in the retrocaval space measuring up to 1.7 cm on image 39/2. Reproductive: Prostate contains calcifications, asymmetric towards the left. Other: Small amount of free fluid in the abdomen and pelvis. Musculoskeletal: Evidence for an old right tenth rib fracture. Bilateral inguinal hernias containing fat. Small amount of fluid in the left inguinal hernia. No suspicious osseous lesion. IMPRESSION: 1. Large pancreatic head mass compatible with the patient's history of pancreatic cancer. The mass extends cephalad and involves the caudate lobe. There is also a subcapsular fluid collection or implant cephalad to the caudate lobe causing mass effect on the adjacent IVC. 2. Multiple liver lesions, abdominal lymphadenopathy and multiple pulmonary nodules. Findings compatible with metastatic disease. 3. Metallic biliary stent in the common bile duct and there is a small amount of pneumobilia. No significant intrahepatic biliary dilatation. In addition, there is moderate distention of the gallbladder containing iodinated contrast and small amount of gas. Findings compatible with recent ERCP. Gallbladder distension could be a source of abdominal pain. 4. Bilateral small pleural effusions with compressive atelectasis in the lower lobes. 5. Small amount of free fluid in the abdomen and pelvis. 6. Colonic diverticulosis without evidence for acute diverticulitis. 7. Aortic Atherosclerosis (ICD10-I70.0). 8. 9 mm splenic artery aneurysm. Electronically Signed   By: Richarda Overlie M.D.   On: 09/26/2022 09:50   DG Chest 2 View  Result Date: 09/26/2022 CLINICAL DATA:  77 year old male with history of central chest pain.  EXAM: CHEST - 2 VIEW COMPARISON:  Chest x-ray 07/28/2014. FINDINGS: Lung volumes are low. No consolidative airspace disease. Trace bilateral pleural effusions with bibasilar opacities that are favored to reflect subsegmental atelectasis. No pneumothorax. No evidence of pulmonary edema. No definite suspicious appearing pulmonary nodules or masses are noted. Heart size is normal. Upper mediastinal contours are within normal limits. Atherosclerotic calcifications in the thoracic aorta. IMPRESSION: 1. Low lung volumes with trace bilateral pleural effusions and bibasilar opacities favored to reflect areas of subsegmental atelectasis. 2. Aortic atherosclerosis. Electronically Signed   By: Trudie Reed M.D.   On: 09/26/2022 06:19    Assessment and Plan   BELFORD CASCANTE is a 77 y.o. male with medical history significant of adult onset type I diabetes follows with [facility] endocrinology, hypertension, CKD stage II, hyperlipidemia, a fib on eliquis presents to the emergency room with abdominal pain which started early hours of this morning at 4 o'clock.   Patient was recently admitted at [facility] from 09/21/22--09/25/22 when he went there for abdominal pain and unintentional weight loss and workup showed patient has primary metastatic pancreatic adenocarcinoma.   Abdominal pain diffuse suspected due to recent ERCP with stent placement and new diagnosis of primary pancreatic adenocarcinoma with metastasis Acalculous cholecystitis Sepsis  -- came in with abdominal pain, abnormal CT scan, tachycardia, elevated white count, tachypnea -- CT abdomen also shows some gallbladder distention -- will get ultrasound of the abdomen to evaluate gallbladder -- consider surgery consultation pending ultrasound -- patient's LFTs are stable. -- Will give IV and oral pain meds -- stool softener --7/14--cont with nausea.  --[place] abd . Gallbladder sludge with mild gallbladder wall thickening and small volume pericholecystic  fluid. Positive sonographic Murphy sign. Findings are concerning for acute acalculous cholecystitis. 2. Liver metastases seen on CT are not well identified by ultrasound. -- Gen. surgery consultation with Dr. Claudine Mouton-- patient will need gallbladder drain placed by IR. Surgery to reach out IR.-- I will hold eliquis and continue IV fluids. Will add empiric IV Unasyn  Primary metastatic adenocarcinoma of the pancreas -- patient currently had workup done at [facility]. -- Discussed with patient and wife and they would want to follow-up at [facility] from cancer standpoint.   A fib with RVR -- pt's eliquis  was started back on 11th of July after the procedure -- HR stable --hold eliquis for GB drain placement tomorrow   Hypertension -- continue beta-blocker -- hold Norvasc and lisinopril-- home meds   Type I diabetes adult-onset -- patient follows with [facility] endocrinology -- poor appetite lately with weight loss. -- For now sliding scale insulin -- patient takes Lantus   Hyperlipidemia -- continue statins      Advance Care Planning:   Code Status: Full Code d/w pt and wife  Consults: Gen surgery  Family Communication: wife in room DVT Prophylaxis :Heparin SQ (holing eliquis) Level of care: Telemetry Medical Status is: inpatient Sepsis due to acalculous cholecystitis     TOTAL TIME TAKING CARE OF THIS PATIENT: 35 minutes.  >50% time spent on counselling and coordination of care  Note: This dictation was prepared with Dragon dictation along with smaller phrase technology. Any transcriptional errors that result from this process are unintentional.  Enedina Finner M.D    [facility]   CC: Primary care physician; Dondra Spry, MD

## 2022-09-27 NOTE — Consult Note (Signed)
[facility] SURGICAL CONSULTATION NOTE (initial) - cpt: [id number]   HISTORY OF PRESENT ILLNESS (HPI):  77 y.o. male presented to [facility] ED yesterday for evaluation of abdominal pain. Patient reports pain began approximately 2 to 3 days ago with associated feeling hot and having sweats and chills.  Associated nausea and vomiting.  Patient was admitted and initiated on antibiotics. History of recent biliary stenting for metastatic pancreatic carcinoma. Surgery is consulted by hospitalist physician Dr. Allena Katz in this context for evaluation and management of acute cholecystitis.  PAST MEDICAL HISTORY (PMH):  Past Medical History:  Diagnosis Date   Atrial fibrillation (HCC)    Cancer (HCC)    pancreatic   Diabetes mellitus without complication (HCC)    Hypercholesteremia    Hypertension      PAST SURGICAL HISTORY (PSH):  Past Surgical History:  Procedure Laterality Date   KNEE SURGERY     REPLACEMENT TOTAL KNEE Right    SHOULDER SURGERY       MEDICATIONS:  Prior to Admission medications   Medication Sig Start Date End Date Taking? Authorizing Provider  amLODipine (NORVASC) 10 MG tablet Take 10 mg by mouth daily. 03/20/21  Yes [provider]  atorvastatin (LIPITOR) 40 MG tablet Take 40 mg by mouth every morning. 03/20/21  Yes [provider]  ELIQUIS 5 MG TABS tablet Take 5 mg by mouth 2 (two) times daily. 03/20/21  Yes [provider]  insulin glargine (LANTUS) 100 UNIT/ML injection Inject 18 Units into the skin daily.   Yes [provider]  lisinopril (ZESTRIL) 5 MG tablet Take 5 mg by mouth every morning. 03/02/21  Yes [provider]  oxyCODONE (OXY IR/ROXICODONE) 5 MG immediate release tablet Take 5 mg by mouth every 6 (six) hours as needed for severe pain.   Yes [provider]  blood glucose meter kit and supplies KIT Dispense based on patient and insurance preference. Use up to four times daily as directed. 03/27/22    Sherryll Burger, Pratik D, DO  Insulin Pen Needle (PEN NEEDLES 3/16") 31G X 5 MM MISC Take as directed with insulin pen. 03/27/22   Sherryll Burger, Pratik D, DO     ALLERGIES:  No Known Allergies   SOCIAL HISTORY:  Social History   Socioeconomic History   Marital status: Married    Spouse name: Not on file   Number of children: Not on file   Years of education: Not on file   Highest education level: Not on file  Occupational History   Not on file  Tobacco Use   Smoking status: Never   Smokeless tobacco: Never  Substance and Sexual Activity   Alcohol use: Not Currently   Drug use: Never   Sexual activity: Not on file  Other Topics Concern   Not on file  Social History Narrative   ** Merged History Encounter **       Social Determinants of Health   Financial Resource Strain: Low Risk  (09/21/2022)   Received from [facility]   Overall Financial Resource Strain (CARDIA)    Difficulty of Paying Living Expenses: Not very hard  Food Insecurity: No Food Insecurity (09/26/2022)   Hunger Vital Sign    Worried About Running Out of Food in the Last Year: Never true    Ran Out of Food in the Last Year: Never true  Transportation Needs: No Transportation Needs (09/26/2022)   PRAPARE - Transportation    Lack of Transportation (Medical): No    Lack of  Transportation (Non-Medical): No  Physical Activity: Not on file  Stress: No Stress Concern Present (09/21/2022)   Received from [employer] [facility] - Occupational Stress Questionnaire    Feeling of Stress : Not at all  Social Connections: Not on file  Intimate Partner Violence: Not At Risk (09/26/2022)   Humiliation, Afraid, Rape, and Kick questionnaire    Fear of Current or Ex-Partner: No    Emotionally Abused: No    Physically Abused: No    Sexually Abused: No     FAMILY HISTORY:  History reviewed. No pertinent family history.     VITAL SIGNS:  Temp:  [97.5 F (36.4 C)-97.7 F (36.5 C)] 97.7 F (36.5  C) (07/14 0751) Pulse Rate:  [36-128] 75 (07/14 0751) Resp:  [16-23] 20 (07/14 0751) BP: (90-149)/(62-107) 90/62 (07/14 0751) SpO2:  [90 %-100 %] 92 % (07/14 0751)     Height: 6' (182.9 cm) Weight: 73.9 kg BMI (Calculated): 22.1   INTAKE/OUTPUT:  07/13 0701 - 07/14 0700 In: 210 [P.O.:210] Out: 300 [Emesis/NG output:300]  PHYSICAL EXAM:  Physical Exam Blood pressure 90/62, pulse 75, temperature 97.7 F (36.5 C), resp. rate 20, height 6' (1.829 m), weight 73.9 kg, SpO2 92%. Last Weight  Most recent update: 09/26/2022  5:19 AM    Weight  73.9 kg (163 lb)             CONSTITUTIONAL: Appropriately responsive and aware without distress.   EYES: Sclera non-icteric.   EARS, NOSE, MOUTH AND THROAT:  The oropharynx is clear.    Hearing is intact to voice.  NECK: Trachea is midline, and there is no jugular venous distension.  LYMPH NODES:  Lymph nodes in the neck are not enlarged. RESPIRATORY:   Normal respiratory effort without pathologic use of accessory muscles. CARDIOVASCULAR:  Well perfused.  GI: The abdomen is tender to the right costal margin, otherwise soft, nontender, and nondistended.  MUSCULOSKELETAL:  Symmetrical muscle tone appreciated in all four extremities. Warm without edema.  SKIN: Skin turgor is normal. No pathologic skin lesions appreciated.  NEUROLOGIC:  Motor and sensation appear grossly normal.  Cranial nerves are grossly without defect. PSYCH:  Alert and oriented to person, place and time. Affect is appropriate for situation.  Data Reviewed I have personally reviewed what is currently available of the patient's imaging, recent labs and medical records.    Labs:     Latest Ref Rng & Units 09/26/2022    5:38 AM 03/27/2022    5:49 AM 03/26/2022    8:33 PM  CBC  WBC 4.0 - 10.5 K/uL 15.7  5.3  5.4   Hemoglobin 13.0 - 17.0 g/dL 81.1  91.4  78.2   Hematocrit 39.0 - 52.0 % 41.3  43.2  43.1   Platelets 150 - 400 K/uL 337  126  202       Latest Ref Rng & Units  09/26/2022    6:55 AM 03/27/2022    5:49 AM 03/26/2022    8:33 PM  CMP  Glucose 70 - 99 mg/dL 956  213  086   BUN 8 - 23 mg/dL 15  20  27    Creatinine 0.61 - 1.24 mg/dL 5.78  4.69  6.29   Sodium 135 - 145 mmol/L 133  136  126   Potassium 3.5 - 5.1 mmol/L 3.4  3.5  4.3   Chloride 98 - 111 mmol/L 98  104  94   CO2 22 - 32 mmol/L 25  22  21   Calcium 8.9 - 10.3 mg/dL 7.4  8.7  9.4   Total Protein 6.5 - 8.1 g/dL 5.4  6.0    Total Bilirubin 0.3 - 1.2 mg/dL 1.1  0.8    Alkaline Phos 38 - 126 U/L 113  67    AST 15 - 41 U/L 32  19    ALT 0 - 44 U/L 24  20       Imaging studies:   Last 24 hrs: US Abdomen Limited RUQ (LIVER/GB)  Result Date: 09/26/2022 CLINICAL DATA:  Abdominal pain. History of metastatic pancreatic cancer. EXAM: ULTRASOUND ABDOMEN LIMITED RIGHT UPPER QUADRANT COMPARISON:  CT abdomen pelvis from same day. FINDINGS: Gallbladder: No gallstones. Sludge with mild gallbladder wall thickening and small volume pericholecystic fluid. Positive sonographic Murphy sign noted by sonographer. Common bile duct: Diameter: 4 mm, normal.  Common bile duct stent noted. Liver: Liver lesions seen on CT are not well identified by ultrasound. Within normal limits in parenchymal echogenicity. Portal vein is patent on color Doppler imaging with normal direction of blood flow towards the liver. Other: None. IMPRESSION: 1. Gallbladder sludge with mild gallbladder wall thickening and small volume pericholecystic fluid. Positive sonographic Murphy sign. Findings are concerning for acute acalculous cholecystitis. 2. Liver metastases seen on CT are not well identified by ultrasound. Electronically Signed   By: Obie Dredge M.D.   On: 09/26/2022 14:56     Assessment/Plan:  77 y.o. 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  -I agree with Dr. Fredia Sorrow that deferring this until tomorrow would allow for less potential for bleeding complications, especially in light on transhepatic approach.  -Anticipate this drain could be and perhaps his lifetime, not anticipating a potential for eventual cholecystectomy.  -Will DC Eliquis and heparin, will make n.p.o. after midnight.  Continue current IV antibiotic coverage.  All of the above findings and recommendations were discussed with the patient and  family(if present), and all of patient's and present family's questions were answered to their expressed satisfaction.  Thank you for the opportunity to participate in this patient's care.   -- Campbell Lerner, M.D., FACS 09/27/2022, 11:41 AM

## 2022-09-27 NOTE — Plan of Care (Signed)
  Problem: Education: Goal: Ability to describe self-care measures that may prevent or decrease complications (Diabetes Survival Skills Education) will improve Outcome: Progressing Goal: Individualized Educational Video(s) Outcome: Progressing   Problem: Coping: Goal: Ability to adjust to condition or change in health will improve Outcome: Progressing   Problem: Fluid Volume: Goal: Ability to maintain a balanced intake and output will improve Outcome: Progressing   Problem: Metabolic: Goal: Ability to maintain appropriate glucose levels will improve Outcome: Progressing   

## 2022-09-27 NOTE — Progress Notes (Signed)
Pharmacy Antibiotic Note  Cameron Gutierrez is a 77 y.o. male w/ PMH of DM, HTN, CKD, atrial fibrillation admitted on 09/26/2022 with intra-abdominal infection.  Pharmacy has been consulted for Unasyn dosing.  Plan: start Unasyn 3 grams IV every 6 hours ---follow renal function for needed dose adjustments  Height: 6' (182.9 cm) Weight: 73.9 kg (163 lb) IBW/kg (Calculated) : 77.6  Temp (24hrs), Avg:97.7 F (36.5 C), Min:97.5 F (36.4 C), Max:98.2 F (36.8 C)  Recent Labs  Lab 09/26/22 0538 09/26/22 0655  WBC 15.7*  --   CREATININE  --  0.97    Estimated Creatinine Clearance: 68.8 mL/min (by C-G formula based on SCr of 0.97 mg/dL).    No Known Allergies  Antimicrobials this admission: 07/14 Unasyn >>   Microbiology results: none  Thank you for allowing pharmacy to be a part of this patient's care.  Lowella Bandy 09/27/2022 8:40 AM

## 2022-09-28 ENCOUNTER — Inpatient Hospital Stay: Payer: Medicare Other | Admitting: Radiology

## 2022-09-28 ENCOUNTER — Inpatient Hospital Stay: Payer: Medicare Other

## 2022-09-28 ENCOUNTER — Other Ambulatory Visit: Payer: Self-pay

## 2022-09-28 DIAGNOSIS — E43 Unspecified severe protein-calorie malnutrition: Secondary | ICD-10-CM | POA: Insufficient documentation

## 2022-09-28 DIAGNOSIS — C259 Malignant neoplasm of pancreas, unspecified: Secondary | ICD-10-CM | POA: Diagnosis not present

## 2022-09-28 DIAGNOSIS — R1084 Generalized abdominal pain: Secondary | ICD-10-CM | POA: Diagnosis not present

## 2022-09-28 DIAGNOSIS — I4891 Unspecified atrial fibrillation: Secondary | ICD-10-CM

## 2022-09-28 DIAGNOSIS — K810 Acute cholecystitis: Secondary | ICD-10-CM | POA: Diagnosis not present

## 2022-09-28 LAB — APTT: aPTT: 39 seconds — ABNORMAL HIGH (ref 24–36)

## 2022-09-28 LAB — CBC
HCT: 36.5 % — ABNORMAL LOW (ref 39.0–52.0)
Hemoglobin: 12.9 g/dL — ABNORMAL LOW (ref 13.0–17.0)
MCH: 28.1 pg (ref 26.0–34.0)
MCHC: 35.3 g/dL (ref 30.0–36.0)
MCV: 79.5 fL — ABNORMAL LOW (ref 80.0–100.0)
Platelets: 327 10*3/uL (ref 150–400)
RBC: 4.59 MIL/uL (ref 4.22–5.81)
RDW: 13.9 % (ref 11.5–15.5)
WBC: 18.7 10*3/uL — ABNORMAL HIGH (ref 4.0–10.5)
nRBC: 0 % (ref 0.0–0.2)

## 2022-09-28 LAB — TROPONIN I (HIGH SENSITIVITY): Troponin I (High Sensitivity): 23 ng/L — ABNORMAL HIGH (ref <18)

## 2022-09-28 LAB — BASIC METABOLIC PANEL
Anion gap: 15 (ref 5–15)
BUN: 70 mg/dL — ABNORMAL HIGH (ref 8–23)
CO2: 23 mmol/L (ref 22–32)
Calcium: 7.3 mg/dL — ABNORMAL LOW (ref 8.9–10.3)
Chloride: 96 mmol/L — ABNORMAL LOW (ref 98–111)
Creatinine, Ser: 2.9 mg/dL — ABNORMAL HIGH (ref 0.61–1.24)
GFR, Estimated: 22 mL/min — ABNORMAL LOW (ref >60)
Glucose, Bld: 157 mg/dL — ABNORMAL HIGH (ref 70–99)
Potassium: 4.4 mmol/L (ref 3.5–5.1)
Sodium: 134 mmol/L — ABNORMAL LOW (ref 135–145)

## 2022-09-28 LAB — GLUCOSE, CAPILLARY
Glucose-Capillary: 154 mg/dL — ABNORMAL HIGH (ref 70–99)
Glucose-Capillary: 117 mg/dL — ABNORMAL HIGH (ref 70–99)
Glucose-Capillary: 146 mg/dL — ABNORMAL HIGH (ref 70–99)
Glucose-Capillary: 156 mg/dL — ABNORMAL HIGH (ref 70–99)
Glucose-Capillary: 133 mg/dL — ABNORMAL HIGH (ref 70–99)

## 2022-09-28 LAB — HEMOGLOBIN A1C
Hgb A1c MFr Bld: 7 % — ABNORMAL HIGH (ref 4.8–5.6)
Mean Plasma Glucose: 154 mg/dL

## 2022-09-28 LAB — PROTIME-INR
INR: 1.7 — ABNORMAL HIGH (ref 0.8–1.2)
Prothrombin Time: 20 seconds — ABNORMAL HIGH (ref 11.4–15.2)

## 2022-09-28 LAB — HEPARIN LEVEL (UNFRACTIONATED): Heparin Unfractionated: 0.71 IU/mL — ABNORMAL HIGH (ref 0.30–0.70)

## 2022-09-28 LAB — BRAIN NATRIURETIC PEPTIDE: B Natriuretic Peptide: 222.2 pg/mL — ABNORMAL HIGH (ref 0.0–100.0)

## 2022-09-28 LAB — MRSA NEXT GEN BY PCR, NASAL: MRSA by PCR Next Gen: NOT DETECTED

## 2022-09-28 MED ORDER — CHLORHEXIDINE GLUCONATE CLOTH 2 % EX PADS
6.0000 | MEDICATED_PAD | Freq: Every day | CUTANEOUS | Status: DC
  Administered 2022-09-28 – 2022-10-12 (×15): 6 via TOPICAL

## 2022-09-28 MED ORDER — DILTIAZEM HCL-DEXTROSE 125-5 MG/125ML-% IV SOLN (PREMIX)
5.0000 mg/h | INTRAVENOUS | Status: DC
  Filled 2022-09-28: qty 125

## 2022-09-28 MED ORDER — FENTANYL CITRATE PF 50 MCG/ML IJ SOSY
50.0000 ug | PREFILLED_SYRINGE | INTRAMUSCULAR | Status: DC | PRN
  Administered 2022-09-29 – 2022-10-12 (×15): 50 ug via INTRAVENOUS
  Filled 2022-09-28 (×16): qty 1

## 2022-09-28 MED ORDER — METOPROLOL TARTRATE 5 MG/5ML IV SOLN
INTRAVENOUS | Status: AC
  Filled 2022-09-28: qty 5

## 2022-09-28 MED ORDER — DILTIAZEM HCL 25 MG/5ML IV SOLN
10.0000 mg | Freq: Once | INTRAVENOUS | Status: AC
  Administered 2022-09-28: 10 mg via INTRAVENOUS
  Filled 2022-09-28 (×2): qty 5

## 2022-09-28 MED ORDER — LEVALBUTEROL HCL 0.63 MG/3ML IN NEBU
0.6300 mg | INHALATION_SOLUTION | Freq: Four times a day (QID) | RESPIRATORY_TRACT | Status: DC | PRN
  Administered 2022-10-06: 0.63 mg via RESPIRATORY_TRACT
  Filled 2022-09-28: qty 3

## 2022-09-28 MED ORDER — AMIODARONE LOAD VIA INFUSION
150.0000 mg | Freq: Once | INTRAVENOUS | Status: AC
  Administered 2022-09-28: 150 mg via INTRAVENOUS
  Filled 2022-09-28: qty 83.34

## 2022-09-28 MED ORDER — FUROSEMIDE 10 MG/ML IJ SOLN: 20.0000 mg | Freq: Once | INTRAMUSCULAR | Status: AC

## 2022-09-28 MED ORDER — LIDOCAINE HCL 1 % IJ SOLN
INTRAMUSCULAR | Status: AC
  Filled 2022-09-28: qty 20

## 2022-09-28 MED ORDER — SODIUM CHLORIDE 0.9 % IV SOLN: Freq: Once | INTRAVENOUS | Status: AC

## 2022-09-28 MED ORDER — AMIODARONE HCL IN DEXTROSE 360-4.14 MG/200ML-% IV SOLN
60.0000 mg/h | INTRAVENOUS | Status: DC
  Administered 2022-09-28: 60 mg/h via INTRAVENOUS
  Filled 2022-09-28 (×2): qty 200

## 2022-09-28 MED ORDER — HEPARIN (PORCINE) 25000 UT/250ML-% IV SOLN
850.0000 [IU]/h | INTRAVENOUS | Status: DC
  Administered 2022-09-28: 1000 [IU]/h via INTRAVENOUS
  Administered 2022-09-29: 950 [IU]/h via INTRAVENOUS
  Filled 2022-09-28 (×2): qty 250

## 2022-09-28 MED ORDER — PHENYLEPHRINE HCL-NACL 20-0.9 MG/250ML-% IV SOLN
0.0000 ug/min | INTRAVENOUS | Status: DC
  Administered 2022-09-28: 20 ug/min via INTRAVENOUS
  Filled 2022-09-28: qty 250

## 2022-09-28 MED ORDER — AMIODARONE HCL IN DEXTROSE 360-4.14 MG/200ML-% IV SOLN
30.0000 mg/h | INTRAVENOUS | Status: DC
  Administered 2022-09-29 – 2022-10-07 (×18): 30 mg/h via INTRAVENOUS
  Filled 2022-09-28 (×18): qty 200

## 2022-09-28 MED ORDER — POTASSIUM CHLORIDE CRYS ER 20 MEQ PO TBCR
40.0000 meq | EXTENDED_RELEASE_TABLET | Freq: Once | ORAL | Status: DC
  Filled 2022-09-28: qty 2

## 2022-09-28 MED ORDER — FUROSEMIDE 10 MG/ML IJ SOLN
INTRAMUSCULAR | Status: AC
  Administered 2022-09-28: 20 mg via INTRAVENOUS
  Filled 2022-09-28: qty 2

## 2022-09-28 NOTE — Progress Notes (Signed)
[facility] SURGICAL PROGRESS NOTE (cpt [phone])  Hospital Day(s): 1.   Interval History: Patient seen and examined, no acute events or new complaints overnight. Patient reports he is doing well; having hot flashes. Still with RUQ soreness but improved. No fever, chills, nausea, emesis. No new labs this morning. Plan for percutaneous cholecystostomy tube placement with interventional radiology today. He is NPO for this procedure. He is on Unasyn.   Review of Systems:  Constitutional: denies fever, chills  HEENT: denies cough or congestion  Respiratory: denies any shortness of breath  Cardiovascular: denies chest pain or palpitations  Gastrointestinal: + abdominal pain (RUQ; improved); denied N/V Genitourinary: denies burning with urination or urinary frequency Musculoskeletal: denies pain, decreased motor or sensation  Vital signs in last 24 hours: [min-max] current  Temp:  [97.8 F (36.6 C)-98.2 F (36.8 C)] 97.8 F (36.6 C) (07/15 0833) Pulse Rate:  [69-85] 85 (07/15 0833) Resp:  [17-24] 20 (07/15 0833) BP: (101-111)/(69-75) 104/71 (07/15 0833) SpO2:  [86 %-92 %] 86 % (07/15 0833)     Height: 6' (182.9 cm) Weight: 73.9 kg BMI (Calculated): 22.1   Intake/Output last 2 shifts:  07/14 0701 - 07/15 0700 In: 0  Out: 50 [Urine:50]   Physical Exam:  Constitutional: alert, cooperative and no distress  HENT: normocephalic without obvious abnormality  Eyes: PERRL, EOM's grossly intact and symmetric  Respiratory: breathing non-labored at rest  Cardiovascular: regular rate and sinus rhythm  Gastrointestinal: soft, RUQ remains tender with deep palpation, and non-distended, no rebound/guarding Musculoskeletal: no edema or wounds, motor and sensation grossly intact, NT    Labs:     Latest Ref Rng & Units 09/27/2022    2:21 PM 09/26/2022    5:38 AM 03/27/2022    5:49 AM  CBC  WBC 4.0 - 10.5 K/uL 21.3  15.7  5.3   Hemoglobin 13.0 - 17.0 g/dL 29.5  62.1  30.8    Hematocrit 39.0 - 52.0 % 43.4  41.3  43.2   Platelets 150 - 400 K/uL 341  337  126       Latest Ref Rng & Units 09/26/2022    6:55 AM 03/27/2022    5:49 AM 03/26/2022    8:33 PM  CMP  Glucose 70 - 99 mg/dL 657  846  962   BUN 8 - 23 mg/dL 15  20  27    Creatinine 0.61 - 1.24 mg/dL 9.52  8.41  3.24   Sodium 135 - 145 mmol/L 133  136  126   Potassium 3.5 - 5.1 mmol/L 3.4  3.5  4.3   Chloride 98 - 111 mmol/L 98  104  94   CO2 22 - 32 mmol/L 25  22  21    Calcium 8.9 - 10.3 mg/dL 7.4  8.7  9.4   Total Protein 6.5 - 8.1 g/dL 5.4  6.0    Total Bilirubin 0.3 - 1.2 mg/dL 1.1  0.8    Alkaline Phos 38 - 126 U/L 113  67    AST 15 - 41 U/L 32  19    ALT 0 - 44 U/L 24  20       Imaging studies: No new pertinent imaging studies   Assessment/Plan: (ICD-10's: K81.0) 77 y.o. male with acute cholecystitis, complicated by pertinent comorbidities including metastatic pancreatic cancer.   - Appreciate IR assistance; plan for percutaneous cholecystostomy tube placement today    - NPO for planned procedure; okay to resume diet after completion   - Continue IV  Abx (Unasyn) - Monitor abdominal examination; on-going bowel function    - Pain control prn; antiemetics prn - Further management per primary service; we will follow    All of the above findings and recommendations were discussed with the patient, patient's family at bedside, and the medical team, and all of patient's and family's questions were answered to their expressed satisfaction.  -- Lynden Oxford, PA-C [facility] 09/28/2022, 9:18 AM M-F: 7am - 4pm

## 2022-09-28 NOTE — Plan of Care (Signed)
   Problem: Education: Goal: Ability to describe self-care measures that may prevent or decrease complications (Diabetes Survival Skills Education) will improve Outcome: Progressing   Problem: Coping: Goal: Ability to adjust to condition or change in health will improve Outcome: Progressing   Problem: Fluid Volume: Goal: Ability to maintain a balanced intake and output will improve Outcome: Progressing   Problem: Skin Integrity: Goal: Risk for impaired skin integrity will decrease Outcome: Progressing   Problem: Tissue Perfusion: Goal: Adequacy of tissue perfusion will improve Outcome: Progressing

## 2022-09-28 NOTE — OR Nursing (Signed)
Transferred to ICU 7 on cardiac monitor, oxygen 6 liters [place].  report given to Adron Bene RN. Wife and family taken to waiting room. Pt remains in afib rate 130's. Pt voided about 10 ml concentrated yellow urine in urinal prior to transfer to unit.

## 2022-09-28 NOTE — Progress Notes (Signed)
Nutrition Follow-up  DOCUMENTATION CODES:   Severe malnutrition in context of chronic illness  INTERVENTION:   -Once diet has been advanced, add:   -Ensure Enlive po TID, each supplement provides 350 kcal and 20 grams of protein.  -Magic cup TID with meals, each supplement provides 290 kcal and 9 grams of protein  -MVI with minerals daily   NUTRITION DIAGNOSIS:   Severe Malnutrition related to chronic illness (newly diagnosed pancreatic cancer) as evidenced by mild fat depletion, moderate fat depletion, moderate muscle depletion, severe muscle depletion, percent weight loss, energy intake < or equal to 75% for > or equal to 1 month.  Ongoing  GOAL:   Patient will meet greater than or equal to 90% of their needs  Progressing   MONITOR:   PO intake, Supplement acceptance, Diet advancement  REASON FOR ASSESSMENT:   Consult Assessment of nutrition requirement/status  ASSESSMENT:   Pt with medical history significant of adult onset type I diabetes follows with [facility] endocrinology, hypertension, CKD stage II, hyperlipidemia, a fib on eliquis presents with abdominal pain which started the morning of admission.  Reviewed I/O's: +2.4 L x 24 hours  Per general surgery notes, pt with  acute acalculous cholecystitis. Plan for image-guided percutaneous cholecystostomy placement today. Pt is currently NPO for procedure.   Spoke with pt and wife at bedside. Pt has experienced a general decline in health over the past year, more so in the fast few months. Pt endorses decreased appetite and early satiety over the past 3-4 months. Per wife, pt consumes about 50% of his meals. Pt consumes 3 meals per day (Breakfast: egg sandwich; Lunch: sandwich or fast food; Dinner: meat, starch, and vegetable). Pt has been on full liquids this admission and has been able to keep down mostly potato soup.   Per pt wife, pt has been losing weight progressively (about 30#) over the past 1.5 years. Pt admits he  initially thought that weight loss was related to his DM. 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 Pt shares that he now realized that it is likely related to his cancer. Reviewed wt hx; pt has experienced a 18.5% wt loss over the past 6 months, which is significant for time frame He is unsure of his UBW.   Pt understanding of NPO but hopeful to eat soon. Pt typically consuming only bites and sips at meals, but enjoys the supplements. Pt wife reports pt was drinking Glucerna PTA as she was told this was better for his DM. She also reports concern about higher than usual blood sugar readings in the hospital. RD discussed acute stress response related to blood sugar and acute illness. Wife initially hesitant to try Ensure, however, agreeable when RD discussed importance of nutrient dense foods in the setting of early satiety, poor appetite, and weight loss.   Medications reviewed and include colace and miralax.   Labs reviewed: Na: 133, K: 3.4, CBGS: 146 (inpatient orders for glycemic control are 0-5 units insulin aspart daily at bedtime and 0-9 units insulin aspart TID with meals).    NUTRITION - FOCUSED PHYSICAL EXAM:  Flowsheet Row Most Recent Value  Orbital Region Moderate depletion  Upper Arm Region Severe depletion  Thoracic and Lumbar Region Moderate depletion  Buccal Region Moderate depletion  Temple Region Severe depletion  Clavicle Bone Region Severe depletion  Clavicle and Acromion Bone Region Severe  depletion  Scapular Bone Region Severe depletion  Dorsal Hand Moderate depletion  Patellar Region Moderate depletion  Anterior Thigh Region Moderate depletion  Posterior Calf Region Moderate depletion  Edema (RD Assessment) None  Hair Reviewed  Eyes Reviewed  Mouth Reviewed  Skin Reviewed  Nails Reviewed       Diet Order:   Diet Order             Diet  clear liquid Fluid consistency: Thin  Diet effective now                   EDUCATION NEEDS:   Education needs have been addressed  Skin:  Skin Assessment: Reviewed RN Assessment  Last BM:  09/26/22 (type 5)  Height:   Ht Readings from Last 1 Encounters:  09/26/22 6' (1.829 m)    Weight:   Wt Readings from Last 1 Encounters:  09/26/22 73.9 kg    Ideal Body Weight:  80.9 kg  BMI:  Body mass index is 22.11 kg/m.  Estimated Nutritional Needs:   Kcal:  2200-2400  Protein:  110-125 grams  Fluid:  > 2 L    Levada Schilling, RD, LDN, CDCES Registered Dietitian II Certified Diabetes Care and Education Specialist Please refer to [facility] for RD and/or RD on-call/weekend/after hours pager

## 2022-09-28 NOTE — Progress Notes (Signed)
[facility]  - [facility]   PATIENT NAME: Cameron Gutierrez    MR#:  [id number]  DATE OF BIRTH:  [birth year]  SUBJECTIVE:  patient seen earlier. Wife at bedside. Complains of nausea.  No vomiting. Cont with abdominal pain Pt and wife would like to see Oncology here  VITALS:  Blood pressure 104/71, pulse 85, temperature 97.8 F (36.6 C), temperature source Oral, resp. rate 20, height 6' (1.829 m), weight 73.9 kg, SpO2 (!) 86%.  PHYSICAL EXAMINATION:   GENERAL:  77 y.o.-year-old patient with  acute distress. Appears sick LUNGS: Normal breath sounds bilaterally, no wheezing CARDIOVASCULAR: S1, S2 normal. No murmur   ABDOMEN: Soft, right upper quadrant tender, nondistended. EXTREMITIES: No  edema b/l.    NEUROLOGIC: nonfocal  patient is alert and awake, appears weak and debilitated SKIN: No obvious rash, lesion, or ulcer.   LABORATORY PANEL:  CBC Recent Labs  Lab 09/27/22 1421  WBC 21.3*  HGB 14.7  HCT 43.4  PLT 341    Chemistries  Recent Labs  Lab 09/26/22 0655  NA 133*  K 3.4*  CL 98  CO2 25  GLUCOSE 148*  BUN 15  CREATININE 0.97  CALCIUM 7.4*  AST 32  ALT 24  ALKPHOS 113  BILITOT 1.1   Cardiac Enzymes No results for input(s): "TROPONINI" in the last 168 hours. RADIOLOGY:  US Abdomen Limited RUQ (LIVER/GB)  Result Date: 09/26/2022 CLINICAL DATA:  Abdominal pain. History of metastatic pancreatic cancer. EXAM: ULTRASOUND ABDOMEN LIMITED RIGHT UPPER QUADRANT COMPARISON:  CT abdomen pelvis from same day. FINDINGS: Gallbladder: No gallstones. Sludge with mild gallbladder wall thickening and small volume pericholecystic fluid. Positive sonographic Murphy sign noted by sonographer. Common bile duct: Diameter: 4 mm, normal.  Common bile duct stent noted. Liver: Liver lesions seen on CT are not well identified by ultrasound. Within normal limits in parenchymal echogenicity. Portal vein is patent on color Doppler imaging with normal direction of  blood flow towards the liver. Other: None. IMPRESSION: 1. Gallbladder sludge with mild gallbladder wall thickening and small volume pericholecystic fluid. Positive sonographic Murphy sign. Findings are concerning for acute acalculous cholecystitis. 2. Liver metastases seen on CT are not well identified by ultrasound. Electronically Signed   By: Obie Dredge M.D.   On: 09/26/2022 14:56    Assessment and Plan   Cameron Gutierrez is a 77 y.o. male with medical history significant of adult onset type I diabetes follows with [facility] endocrinology, hypertension, CKD stage II, hyperlipidemia, a fib on eliquis presents to the emergency room with abdominal pain which started early hours of this morning at 4 o'clock.   Patient was recently admitted at [facility] from 09/21/22--09/25/22 when he went there for abdominal pain and unintentional weight loss and workup showed patient has primary metastatic pancreatic adenocarcinoma.   Abdominal pain diffuse suspected due to recent ERCP with stent placement and new diagnosis of primary pancreatic adenocarcinoma with metastasis Acalculous cholecystitis Sepsis  -- came in with abdominal pain, abnormal CT scan, tachycardia, elevated white count, tachypnea -- CT abdomen also shows some gallbladder distention -- will get ultrasound of the abdomen to evaluate gallbladder -- consider surgery consultation pending ultrasound -- patient's LFTs are stable. -- Will give IV and oral pain meds -- stool softener --7/14--cont with nausea.  --[place] abd . Gallbladder sludge with mild gallbladder wall thickening and small volume pericholecystic fluid. Positive sonographic Murphy sign. Findings are concerning for acute acalculous cholecystitis. 2. Liver metastases seen on CT are  not well identified by ultrasound. -- Gen. surgery consultation with Dr. Claudine Mouton-- patient will need gallbladder drain placed by IR. Surgery to reach out IR.-- I will hold eliquis and continue IV fluids.  Will add empiric IV Unasyn --7/15--IR to place GB drain today. Hold eliquis till tomorrow  Primary metastatic adenocarcinoma of the pancreas -- patient currently had workup done at [facility]. -- Discussed with patient and wife and they would like ot see Oncology here from cancer standpoint. --Dr Cathie Hoops to see pt today   A fib with RVR -- pt's eliquis  was started back on 11th of July after the procedure -- HR stable --hold eliquis for GB drain placement today   Hypertension -- continue beta-blocker -- hold Norvasc and lisinopril-- home meds   Type I diabetes adult-onset -- patient follows with [facility] endocrinology -- poor appetite lately with weight loss. -- For now sliding scale insulin -- patient takes Lantus   Hyperlipidemia -- continue statins  Poor po intake/Debility --appreciate dietitian input      Advance Care Planning:   Code Status: Full Code d/w pt and wife  Consults: Gen surgery  Family Communication: wife in room DVT Prophylaxis :Heparin SQ (holing eliquis) Level of care: Telemetry Medical Status is: inpatient Sepsis due to acalculous cholecystitis     TOTAL TIME TAKING CARE OF THIS PATIENT: 35 minutes.  >50% time spent on counselling and coordination of care  Note: This dictation was prepared with Dragon dictation along with smaller phrase technology. Any transcriptional errors that result from this process are unintentional.  Enedina Finner M.D    [facility]   CC: Primary care physician; Dondra Spry, MD

## 2022-09-28 NOTE — OR Nursing (Signed)
Pt arrived to specials recovery for a cholecystostomy drain placment. HR was afib with rapid atrial response. 140's to 170's. Drs Allena Katz and El-Abd notified. 12 lead EKG obtained. Metoprolol 5 mg IV given  per order, NS bolus 250 ml started. Oxygen increased to 3 liter [place]. CXray completed. Dr Allena Katz at bedside 20 mg IV lasix ordered and given at 1519

## 2022-09-28 NOTE — Consult Note (Signed)
Hematology/Oncology Consult note [phone] [phone]      Patient Care Team: Dondra Spry, MD as PCP - General (Internal Medicine) Clinic, General Medical [facility])   Name of the patient: Cameron Gutierrez  [id number]  08-10-1945   REASON FOR COSULTATION:  Pancreatic cancer, consult was requested by Dr. Allena Katz History of presenting illness-  77 y.o. male with PMH listed at below who presents to ER for evaluation abdominal pain.  Recent admission at [facility] 09/21/2022 - 09/25/2022 for abdominal pain, unintentional weight loss of 30 pounds and his workup showed pancreatic mass with liver lesions as well as lung nodules. Patient underwent ERCP/ultrasound with biopsy and stent placement on 09/23/2022 at [facility].  Patient's baseline CA 19-9 was 1406.58 patient was discharged and presented to [facility] emergency room due to severe abdominal pain.  09/23/2022 ERCP/EUS showed    - A perigastric mass was identified endosonographically. Fine needle biopsy performed.                        - A mass was identified in the pancreatic head. Fine  needle biopsy performed. - A single moderate biliary stricture was found in the  lower third of the main bile duct. - A biliary sphincterotomy was performed - One fully covered metal (Viabil) stent was placed into the common bile duct. Pathology showed   A: Perigastric mass, fine-needle biopsy - Scant amounts of poorly differentiated non-small cell carcinoma - See comment   B: Pancreas, head, fine-needle biopsy - Moderately differentiated adenocarcinoma - See comment   09/26/2022, CT abdomen pelvis with contrast showed large pancreatic head mass, multiple liver lesions, abdominal lymphadenopathy and multiple pulmonary nodules.  Metallic biliary stent in the common bile duct and there is a small amount of pneumobilia.  Moderate distention of the gallbladder containing iodinated contrast and a small amount of gas.   Findings compatible with recent ERCP.  Gallbladder distention could be a source of abdominal pain.  Bilateral small amount of pleural effusions with the compressive atelectasis, chronic diverticulosis with no evidence of acute diverticulitis.  9 mm splenic artery aneurysm.  Aortic atherosclerosis. 09/26/2022 ultrasound gallbladder showed gallbladder sludge with mild gallbladder wall thickening and small volume pericholecystic fluid.  Positive Murphy sign, concerning for acute acalculous cholecystitis.  Patient has an appointment with [facility] surgical oncology on 10/05/2022.  Patient's family members and patient are interested in having oncology care locally.  Oncology was consulted [name] and his son [name] were at the bedside.  09/28/2022 Per note, patient went into rapid A-fib, chest x-ray showed vascular congestion was given Lasix 20 mg x 1, metoprolol.  Cardiology was consulted and patient was started on heparin drip at diltiazem drip.  Admitted to stepdown units.  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) 09/26/2022   Permanent atrial fibrillation (HCC) 09/26/2022   Hyperglycemia 03/27/2022   Pseudohyponatremia 03/27/2022   Chronic kidney disease, stage 3b (HCC) 03/27/2022   Essential hypertension 03/27/2022   Mixed hyperlipidemia 03/27/2022   History of atrial fibrillation 03/27/2022     Past Medical History:  Diagnosis Date   Atrial fibrillation (HCC)    Cancer (HCC)    pancreatic   Diabetes  mellitus without complication (HCC)    Hypercholesteremia    Hypertension      Past Surgical History:  Procedure Laterality Date   KNEE SURGERY     REPLACEMENT TOTAL KNEE Right    SHOULDER SURGERY       Social History   Socioeconomic History   Marital status: Married    Spouse name: Not on file   Number of children: Not on file   Years of education: Not on file   Highest education level: Not on file  Occupational History   Not on file  Tobacco Use   Smoking status: Never   Smokeless tobacco: Never  Substance and Sexual Activity   Alcohol use: Not Currently   Drug use: Never   Sexual activity: Not on file  Other Topics Concern   Not on file  Social History Narrative   ** Merged History Encounter **       Social Determinants of Health   Financial Resource Strain: Low Risk  (09/21/2022)   Received from [facility]   Overall Financial Resource Strain (CARDIA)    Difficulty of Paying Living Expenses: Not very hard  Food Insecurity: No Food Insecurity (09/26/2022)   Hunger Vital Sign    Worried About Running Out of Food in the Last Year: Never true    Ran Out of Food in the Last Year: Never true  Transportation Needs: No Transportation Needs (09/26/2022)   PRAPARE - Administrator, Civil Service (Medical): No    Lack of Transportation (Non-Medical): No  Physical Activity: Not on file  Stress: No Stress Concern Present (09/21/2022)   Received from [facility] of Occupational Health - Occupational Stress Questionnaire    Feeling of Stress : Not at all  Social Connections: Not on file  Intimate Partner Violence: Not At Risk (09/26/2022)   Humiliation, Afraid, Rape, and Kick questionnaire    Fear of Current or Ex-Partner: No    Emotionally Abused: No    Physically Abused: No    Sexually Abused: No     History reviewed. No pertinent family history.   Current Facility-Administered Medications:    0.9 %  sodium chloride infusion, , Intravenous, Continuous, Enedina Finner, MD, Last Rate: 100 mL/hr at 09/27/22 0931, Rate Change at 09/27/22 0931   Ampicillin-Sulbactam (UNASYN) 3 g in sodium chloride 0.9 % 100 mL IVPB, 3 g, Intravenous, Q6H,  Lowella Bandy, RPH, Last Rate: 200 mL/hr at 09/28/22 0858, 3 g at 09/28/22 0858   atorvastatin (LIPITOR) tablet 40 mg, 40 mg, Oral, q morning, Enedina Finner, MD, 40 mg at 09/26/22 1457   docusate sodium (COLACE) capsule 100 mg, 100 mg, Oral, BID, Enedina Finner, MD, 100 mg at 09/26/22 2243   feeding supplement (ENSURE ENLIVE / ENSURE PLUS) liquid 237 mL, 237 mL, Oral, TID BM, Enedina Finner, MD, 237 mL at 09/26/22 2247   HYDROmorphone (DILAUDID) injection 0.5 mg, 0.5 mg, Intravenous, Q2H PRN, Willy Eddy, MD, 0.5 mg at 09/26/22 1741   insulin aspart (novoLOG) injection 0-5 Units, 0-5 Units, Subcutaneous, QHS, Enedina Finner, MD   insulin aspart (novoLOG) injection 0-9 Units, 0-9 Units, Subcutaneous, TID WC, Enedina Finner, MD, 1 Units at 09/27/22 1717   metoprolol tartrate (LOPRESSOR) injection 5 mg, 5 mg, Intravenous, Q4H PRN, Enedina Finner, MD   morphine (PF) 2 MG/ML injection 2 mg, 2 mg, Intravenous, Q2H PRN, Enedina Finner, MD   multivitamin with minerals tablet 1 tablet, 1 tablet, Oral, Daily, Allena Katz,  Sona, MD   ondansetron (ZOFRAN) tablet 4 mg, 4 mg, Oral, Q6H PRN **OR** ondansetron (ZOFRAN) injection 4 mg, 4 mg, Intravenous, Q6H PRN, Enedina Finner, MD, 4 mg at 09/27/22 2033   oxyCODONE (Oxy IR/ROXICODONE) immediate release tablet 10 mg, 10 mg, Oral, Q4H PRN, Enedina Finner, MD, 10 mg at 09/27/22 0417   polyethylene glycol (MIRALAX / GLYCOLAX) packet 17 g, 17 g, Oral, BID, Enedina Finner, MD, 17 g at 09/26/22 2242   promethazine (PHENERGAN) 12.5 mg in sodium chloride 0.9 % 50 mL IVPB, 12.5 mg, Intravenous, Q6H PRN, Enedina Finner, MD   traMADol (ULTRAM) tablet 50 mg, 50 mg, Oral, Q6H PRN, Enedina Finner, MD, 50 mg at 09/27/22 2252   traZODone (DESYREL) tablet 25 mg, 25 mg, Oral, QHS PRN, Enedina Finner, MD, 25 mg at 09/27/22 2253  Review of Systems  Constitutional:  Positive for appetite change, fatigue and unexpected weight change. Negative for chills, diaphoresis and fever.  HENT:   Negative for hearing loss and sore  throat.   Eyes:  Negative for eye problems and icterus.  Respiratory:  Negative for chest tightness, cough, hemoptysis, shortness of breath and wheezing.   Cardiovascular:  Negative for chest pain and leg swelling.  Gastrointestinal:  Positive for abdominal pain. Negative for abdominal distention, blood in stool, diarrhea, nausea and rectal pain.  Endocrine: Negative for hot flashes.  Genitourinary:  Negative for difficulty urinating and hematuria.   Musculoskeletal:  Negative for back pain and gait problem.  Skin:  Negative for rash.  Neurological:  Negative for dizziness, gait problem, headaches, numbness and seizures.  Hematological:  Negative for adenopathy.  Psychiatric/Behavioral:  Negative for confusion and decreased concentration. The patient is not nervous/anxious.     PHYSICAL EXAM Vitals:   09/27/22 1500 09/27/22 2127 09/28/22 0500 09/28/22 0833  BP: 101/73 104/75 111/69 104/71  Pulse: 83 73 69 85  Resp: 19 (!) 24 17 20   Temp: 97.9 F (36.6 C) 98 F (36.7 C) 98.2 F (36.8 C) 97.8 F (36.6 C)  TempSrc:   Oral Oral  SpO2:  90% 92% (!) 86%  Weight:      Height:       Physical Exam Constitutional:      General: He is not in acute distress.    Appearance: He is not diaphoretic.  HENT:     Head: Normocephalic.  Eyes:     General: No scleral icterus. Cardiovascular:     Rate and Rhythm: Tachycardia present. Rhythm irregular.  Pulmonary:     Effort: Pulmonary effort is normal. No respiratory distress.  Abdominal:     General: Bowel sounds are normal. There is no distension.     Palpations: Abdomen is soft.     Tenderness: There is abdominal tenderness.  Musculoskeletal:        General: Normal range of motion.     Cervical back: Normal range of motion.  Skin:    General: Skin is warm and dry.     Findings: No erythema.  Neurological:     Mental Status: He is alert and oriented to person, place, and time. Mental status is at baseline.     Cranial Nerves: No  cranial nerve deficit.     Motor: No abnormal muscle tone.  Psychiatric:        Mood and Affect: Mood and affect normal.       LABORATORY STUDIES    Latest Ref Rng & Units 09/27/2022    2:21 PM 09/26/2022    5:38  AM 03/27/2022    5:49 AM  CBC  WBC 4.0 - 10.5 K/uL 21.3  15.7  5.3   Hemoglobin 13.0 - 17.0 g/dL 46.9  62.9  52.8   Hematocrit 39.0 - 52.0 % 43.4  41.3  43.2   Platelets 150 - 400 K/uL 341  337  126       Latest Ref Rng & Units 09/26/2022    6:55 AM 03/27/2022    5:49 AM 03/26/2022    8:33 PM  CMP  Glucose 70 - 99 mg/dL 413  244  010   BUN 8 - 23 mg/dL 15  20  27    Creatinine 0.61 - 1.24 mg/dL 2.72  5.36  6.44   Sodium 135 - 145 mmol/L 133  136  126   Potassium 3.5 - 5.1 mmol/L 3.4  3.5  4.3   Chloride 98 - 111 mmol/L 98  104  94   CO2 22 - 32 mmol/L 25  22  21    Calcium 8.9 - 10.3 mg/dL 7.4  8.7  9.4   Total Protein 6.5 - 8.1 g/dL 5.4  6.0    Total Bilirubin 0.3 - 1.2 mg/dL 1.1  0.8    Alkaline Phos 38 - 126 U/L 113  67    AST 15 - 41 U/L 32  19    ALT 0 - 44 U/L 24  20       RADIOGRAPHIC STUDIES: I have personally reviewed the radiological images as listed and agreed with the findings in the report. US Abdomen Limited RUQ (LIVER/GB)  Result Date: 09/26/2022 CLINICAL DATA:  Abdominal pain. History of metastatic pancreatic cancer. EXAM: ULTRASOUND ABDOMEN LIMITED RIGHT UPPER QUADRANT COMPARISON:  CT abdomen pelvis from same day. FINDINGS: Gallbladder: No gallstones. Sludge with mild gallbladder wall thickening and small volume pericholecystic fluid. Positive sonographic Murphy sign noted by sonographer. Common bile duct: Diameter: 4 mm, normal.  Common bile duct stent noted. Liver: Liver lesions seen on CT are not well identified by ultrasound. Within normal limits in parenchymal echogenicity. Portal vein is patent on color Doppler imaging with normal direction of blood flow towards the liver. Other: None. IMPRESSION: 1. Gallbladder sludge with mild gallbladder wall  thickening and small volume pericholecystic fluid. Positive sonographic Murphy sign. Findings are concerning for acute acalculous cholecystitis. 2. Liver metastases seen on CT are not well identified by ultrasound. Electronically Signed   By: Obie Dredge M.D.   On: 09/26/2022 14:56   CT ABDOMEN PELVIS W CONTRAST  Result Date: 09/26/2022 CLINICAL DATA:  Central chest pain. Abdominal pain. History of pancreatic cancer. EXAM: CT ABDOMEN AND PELVIS WITH CONTRAST TECHNIQUE: Multidetector CT imaging of the abdomen and pelvis was performed using the standard protocol following bolus administration of intravenous contrast. RADIATION DOSE REDUCTION: This exam was performed according to the departmental dose-optimization program which includes automated exposure control, adjustment of the mA and/or kV according to patient size and/or use of iterative reconstruction technique. CONTRAST:  80mL OMNIPAQUE IOHEXOL 300 MG/ML  SOLN COMPARISON:  Chest radiograph 09/26/2022. [facility] CT[facility] from [facility] dated 09/21/2022 and 09/22/2022. Outside images are unavailable but reports are available. FINDINGS: Lower chest: Multiple small pulmonary nodules in the visualized lungs. Index pulmonary nodule is in the right lower lobe along the right major fissure on image 33/4 measuring up to 1.3 cm. There also may be a larger nodule in the left lower lobe measuring 1.4 cm image 15/4. Volume loss and compressive atelectasis in the left lower lobe. Compressive atelectasis in  the right lower lobe. Bilateral small pleural effusions. Hepatobiliary: Multiple ill-defined hypoechoic liver lesions compatible with metastatic disease. Index lesion in the left hepatic lobe measuring 2.1 cm on image 23/2. There is gas and contrast within the gallbladder. The gallbladder is moderately distended and findings compatible with recent ERCP. Metallic biliary stent in the common bile duct. Narrowing in the biliary stent associated with the pancreatic mass. Small  amount of pneumobilia. Small amount of fluid and stranding around the liver and gallbladder. Pancreas: Large poorly defined pancreatic head mass with multiple hypoechoic components. Difficult to accurately measure the mass itself but it roughly measures 4.0 x 4.9 x 5.1 cm. Dilatation of the main pancreatic duct. The mass extends cephalad and appears to be involving the caudate lobe. There is a caudate lesion measuring up to 4.1 cm. In addition, there is hypoechoic implant or fluid collection cephalad to the caudate lobe involving the hepatic subcapsular space. This subcapsular collection or implant measures 7.8 x 4.6 x 5.6 cm. This was present on the previous exam based on the prior report. This caudate/subcapsular component causing mass effect on the adjacent IVC. Mild stranding and edema throughout the porta hepatis region. Spleen: Small splenic calcifications.  Spleen is normal for size. Adrenals/Urinary Tract: Normal adrenal glands. Bilateral cortical and parapelvic renal cysts. No suspicious renal lesions. No hydronephrosis. Bladder is decompressed. There is probably a small bladder diverticulum along the posterior aspect of the bladder. Cannot exclude mild bladder wall thickening. Asymmetric left perinephric stranding. Stomach/Bowel: Normal appearance of the stomach. No bowel dilatation. Colonic diverticula involving the sigmoid colon. No evidence for acute bowel inflammation. Vascular/Lymphatic: Atherosclerotic disease involving the abdominal aorta without aneurysm. Atherosclerotic calcifications at the origin of the SMA. Atherosclerotic calcifications at the origin of the bilateral renal arteries. IVC is patent but there is compression in the suprarenal IVC due to the subcapsular fluid collection / mass. 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 Main portal vein is small but patent. Intrahepatic portal veins are patent. Neoplastic disease abuts the posterior wall of the main portal vein and there  is evidence of tumor encasing the right side of the proximal SMV. Splenic vein is patent. Pathologic lymphadenopathy in the retrocaval space measuring up to 1.7 cm on image 39/2. Reproductive: Prostate contains calcifications, asymmetric towards the left. Other: Small amount of free fluid in the abdomen and pelvis. Musculoskeletal: Evidence for an old right tenth rib fracture. Bilateral inguinal hernias containing fat. Small amount of fluid in the left inguinal hernia. No suspicious osseous lesion. IMPRESSION: 1. Large pancreatic head mass compatible with the patient's history of pancreatic cancer. The mass extends cephalad and involves the caudate lobe. There is also a subcapsular fluid collection or implant cephalad to the caudate lobe causing mass effect on the adjacent IVC. 2. Multiple liver lesions, abdominal lymphadenopathy and multiple pulmonary nodules. Findings compatible with metastatic disease. 3. Metallic biliary stent in the common bile duct and there is a small amount of pneumobilia. No significant intrahepatic biliary dilatation. In addition, there is moderate distention of the gallbladder containing iodinated contrast and small amount of gas. Findings compatible with recent ERCP. Gallbladder distension could be a source of abdominal pain. 4. Bilateral small pleural effusions with compressive atelectasis in the lower lobes. 5. Small amount of free fluid in the abdomen and pelvis. 6. Colonic diverticulosis without evidence for acute diverticulitis. 7. Aortic Atherosclerosis (ICD10-I70.0). 8. 9 mm splenic artery aneurysm. Electronically Signed   By: Richarda Overlie M.D.   On: 09/26/2022 09:50  DG Chest 2 View  Result Date: 09/26/2022 CLINICAL DATA:  77 year old male with history of central chest pain. EXAM: CHEST - 2 VIEW COMPARISON:  Chest x-ray 07/28/2014. FINDINGS: Lung volumes are low. No consolidative airspace disease. Trace bilateral pleural effusions with bibasilar opacities that are favored to  reflect subsegmental atelectasis. No pneumothorax. No evidence of pulmonary edema. No definite suspicious appearing pulmonary nodules or masses are noted. Heart size is normal. Upper mediastinal contours are within normal limits. Atherosclerotic calcifications in the thoracic aorta. IMPRESSION: 1. Low lung volumes with trace bilateral pleural effusions and bibasilar opacities favored to reflect areas of subsegmental atelectasis. 2. Aortic atherosclerosis. Electronically Signed   By: Trudie Reed M.D.   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 Prognosis is poor. I had a lengthy discussion with patient, and his family members. If patient is interested in proceeding with treatment, I will recommend palliative chemotherapy outpatient after his acute issues resolves.  # Acute acalculous cholecystitis There is plan for cholecystostomy drain placement which was canceled due to atrial fibrillation with RVR. Continue IV antibiotics and pain regimen.  # Atrial fibrillation with RVR. Continue anticoagulation with heparin drip.  Appreciate cardiology recommendation.  # Acute hypoxic respiratory failure secondary to pleural effusion and atelectasis. Continue oxygen supplementation wean as tolerated. When patient is stable, consider therapeutic and diagnostic paracentesis. Check echocardiogram.  Thank you for allowing me to participate in the care of this patient.   Rickard Patience, MD, PhD Hematology Oncology 09/28/2022

## 2022-09-28 NOTE — Progress Notes (Addendum)
ANTICOAGULATION CONSULT NOTE - Initial Consult  Pharmacy Consult for IV heparin Indication: atrial fibrillation  No Known Allergies  Patient Measurements: Height: 6' (182.9 cm) Weight: 73.9 kg (163 lb) IBW/kg (Calculated) : 77.6 Heparin Dosing Weight: 73.9 kg  Vital Signs: Temp: 97.9 F (36.6 C) (07/15 1438) Temp Source: Oral (07/15 0833) BP: 101/79 (07/15 1438) Pulse Rate: 85 (07/15 0833)  Labs: Recent Labs    09/26/22 0538 09/26/22 0655 09/27/22 1421 09/28/22 0916  HGB 13.4  --  14.7  --   HCT 41.3  --  43.4  --   PLT 337  --  341  --   LABPROT  --   --  20.5* 20.0*  INR  --   --  1.7* 1.7*  CREATININE  --  0.97  --   --   TROPONINIHS  --  6 11  --     Estimated Creatinine Clearance: 68.8 mL/min (by C-G formula based on SCr of 0.97 mg/dL).   Medical History: Past Medical History:  Diagnosis Date   Atrial fibrillation (HCC)    Cancer (HCC)    pancreatic   Diabetes mellitus without complication (HCC)    Hypercholesteremia    Hypertension     Medications:  Eliquis 5 mg BID (last dose 09/24/22)  Assessment: 77 year old male with PMH atrial fibrillation on Eliquis prior to admission. Plan was for cholecystectomy drain placement, but was cancelled due to acute Afib.  Last dose was > 2 days ago. heparin level is still elevated at baseline. Baseline H&H stable  Goal of Therapy:  aPTT 66-102 seconds Monitor platelets by anticoagulation protocol: Yes   Plan:  No initial bolus  Start heparin infusion at 1000 units/hr Check aPTT level in 8 hours. Will dose heparin via aPTT until correlation with heparin level Daily heparin level Daily CBC   Elliot Gurney, PharmD, BCPS Clinical Pharmacist  09/28/2022 5:21 PM

## 2022-09-28 NOTE — Consult Note (Signed)
Cardiology Consultation   Patient ID: BREVEN GUIDROZ MRN: [id number]; DOB: 1945-08-21  Admit date: 09/26/2022 Date of Consult: 09/28/2022  PCP:  Dondra Spry, MD   [facility] Providers Cardiologist:  None      New consult completed by Dr Kirke Corin  Patient Profile:   Cameron Gutierrez is a 77 y.o. male with a hx of adult onset type 1 diabetes, hypertension, CKD stage II, hyperlipidemia, atrial fibrillation on chronic anticoagulation with apixaban, metastatic pancreatic adenocarcinoma, who is being seen 09/28/2022 for the evaluation of atrial fibrillation RVR at the request of Dr. Allena Katz.  History of Present Illness:   Cameron Gutierrez patient presented to the [facility] emergency department 09/26/2022 with centralized chest pain described as a stabbing.  Onset was 1 hour prior to his arrival.  Stated that he did have associated shortness of breath.  Also states that he has abdominal pain that started the night before that was severe.  Denied any nausea, vomiting, or fevers.  He was just recently diagnosed with pancreatic cancer.  He was discharged with oxycodone which did not help the pain.  During recent admission to [facility] he underwent ERCP with biopsy and stenting and biopsy approximately 3 days prior.  Emergency department reached out to his oncologist at [facility] who stated that the patient's labs and scans did not indicate transfer.  He was evaluated by surgery evaluation and management of acute cholecystitis.  It was decided by general surgeon patient will need gallbladder drain placed by interventional radiology.  Eliquis was placed on hold he was continued on IV fluids.  He was also added on empiric IV Unasyn.   Initial vital signs: Blood pressure 160/94, pulse 52, respirations 22, temperature 97.5  Pertinent labs: WBCs of 15.7, sodium of 133, potassium 3.4, chloride 98, CO2 25, blood glucose 148, BUN 15, serum creatinine 0.97, calcium 7.4, total protein 5.4, albumin of 2.2, high-sensitivity  troponin 6 and 11  Imaging: Chest x-ray revealed low lung volumes with trace bilateral pleural effusions and bibasilar opacities favored to reflect areas of segmental atelectasis, aortic atherosclerosis; CT of the abdomen pelvis revealed large pancreatic head mass compatible with patient's history of pancreatic cancer, multiple liver lesions, abdominal neuropathy and multiple pulmonary nodules findings compatible with metastatic disease metallic biliary stent in the common bile duct and there is a small amount of pneumobilia, moderate distention of the gallbladder, bilateral small pleural effusions with compressive atelectasis lower lobes, small amount of free fluid in the abdomen and pelvis, colonic diverticulosis without acute diverticulitis, 9 mm splenic artery aneurysm; ultrasound of the abdomen reveals gallbladder sludge with mild gallbladder wall thickening and small volume Perry cholecystic fluid positive sonographic Murphy sign findings are concerning for acute cholecystitis, liver metastasis seen on CT are not well identified by ultrasound; repeat chest x-ray completed today reveals bilateral pleural effusion with the right greater than the left, enlarged cardiomediastinal silhouette  Medications administered in the emergency department: 4 mg of IV morphine  Unfortunately procedure had to be canceled as he went into atrial fibrillation RVR with heart rates of 160s and became hypotensive.  Patient was given IV metoprolol to slow his heart rate, IV fluids, and started on heparin infusion.  Cardiology was consulted and recommended to hold off on amiodarone at this time and obtain an echocardiogram.  Chest x-ray showing bilateral pleural effusions he was given a one-time dose of IV furosemide.  Past Medical History:  Diagnosis Date   Atrial fibrillation (HCC)    Cancer (HCC)  pancreatic   Diabetes mellitus without complication (HCC)    Hypercholesteremia    Hypertension     Past Surgical  History:  Procedure Laterality Date   KNEE SURGERY     REPLACEMENT TOTAL KNEE Right    SHOULDER SURGERY       Home Medications:  Prior to Admission medications   Medication Sig Start Date End Date Taking? Authorizing Provider  amLODipine (NORVASC) 10 MG tablet Take 10 mg by mouth daily. 03/20/21  Yes [provider]  atorvastatin (LIPITOR) 40 MG tablet Take 40 mg by mouth every morning. 03/20/21  Yes [provider]  ELIQUIS 5 MG TABS tablet Take 5 mg by mouth 2 (two) times daily. 03/20/21  Yes [provider]  insulin glargine (LANTUS) 100 UNIT/ML injection Inject 18 Units into the skin daily.   Yes [provider]  lisinopril (ZESTRIL) 5 MG tablet Take 5 mg by mouth every morning. 03/02/21  Yes [provider]  oxyCODONE (OXY IR/ROXICODONE) 5 MG immediate release tablet Take 5 mg by mouth every 6 (six) hours as needed for severe pain.   Yes [provider]  blood glucose meter kit and supplies KIT Dispense based on patient and insurance preference. Use up to four times daily as directed. 03/27/22   Sherryll Burger, Pratik D, DO  Insulin Pen Needle (PEN NEEDLES 3/16") 31G X 5 MM MISC Take as directed with insulin pen. 03/27/22   Maurilio Lovely D, DO    Inpatient Medications: Scheduled Meds:  atorvastatin  40 mg Oral q morning   diltiazem  10 mg Intravenous Once   docusate sodium  100 mg Oral BID   feeding supplement  237 mL Oral TID BM   insulin aspart  0-5 Units Subcutaneous QHS   insulin aspart  0-9 Units Subcutaneous TID WC   multivitamin with minerals  1 tablet Oral Daily   polyethylene glycol  17 g Oral BID   Continuous Infusions:  sodium chloride 100 mL/hr at 09/27/22 0931   ampicillin-sulbactam (UNASYN) IV 3 g (09/28/22 0858)   diltiazem (CARDIZEM) infusion     promethazine (PHENERGAN) injection (IM or IVPB)     PRN Meds: HYDROmorphone (DILAUDID) injection, metoprolol tartrate, morphine injection, ondansetron **OR** ondansetron (ZOFRAN)  IV, oxyCODONE, promethazine (PHENERGAN) injection (IM or IVPB), traMADol, traZODone  Allergies:   No Known Allergies  Social History:   Social History   Socioeconomic History   Marital status: Married    Spouse name: Not on file   Number of children: Not on file   Years of education: Not on file   Highest education level: Not on file  Occupational History   Not on file  Tobacco Use   Smoking status: Never   Smokeless tobacco: Never  Substance and Sexual Activity   Alcohol use: Not Currently   Drug use: Never   Sexual activity: Not on file  Other Topics Concern   Not on file  Social History Narrative   ** Merged History Encounter **       Social Determinants of Health   Financial Resource Strain: Low Risk  (09/21/2022)   Received from [facility]   Overall Financial Resource Strain (CARDIA)    Difficulty of Paying Living Expenses: Not very hard  Food Insecurity: No Food Insecurity (09/26/2022)   Hunger Vital Sign    Worried About Running Out of Food in the Last Year: Never true    Ran Out of Food in the Last Year: Never true  Transportation Needs:  No Transportation Needs (09/26/2022)   PRAPARE - Administrator, Civil Service (Medical): No    Lack of Transportation (Non-Medical): No  Physical Activity: Not on file  Stress: No Stress Concern Present (09/21/2022)   Received from [facility] - Occupational Stress Questionnaire    Feeling of Stress : Not at all  Social Connections: Not on file  Intimate Partner Violence: Not At Risk (09/26/2022)   Humiliation, Afraid, Rape, and Kick questionnaire    Fear of Current or Ex-Partner: No    Emotionally Abused: No    Physically Abused: No    Sexually Abused: No    Family History:   History reviewed. No pertinent family history.   ROS:  Please see the history of present illness.  Review of Systems  Constitutional:  Positive for malaise/fatigue.  Respiratory:   Positive for shortness of breath.   Gastrointestinal:  Positive for abdominal pain.    All other ROS reviewed and negative.     Physical Exam/Data:   Vitals:   09/28/22 0833 09/28/22 1438 09/28/22 1507 09/28/22 1524  BP: 104/71 101/79 98/61 100/67  Pulse: 85   (!) 133  Resp: 20 20 (!) 22 (!) 22  Temp: 97.8 F (36.6 C) 97.9 F (36.6 C)    TempSrc: Oral     SpO2: (!) 86% 90% (!) 89% (!) 89%  Weight:      Height:        Intake/Output Summary (Last 24 hours) at 09/28/2022 1548 Last data filed at 09/28/2022 1501 Gross per 24 hour  Intake 2400 ml  Output 50 ml  Net 2350 ml      09/26/2022    5:19 AM 03/26/2022    8:09 PM 06/12/2019   11:19 AM  Last 3 Weights  Weight (lbs) 163 lb 200 lb 204 lb 9.4 oz  Weight (kg) 73.936 kg 90.719 kg 92.8 kg     Body mass index is 22.11 kg/m.  General:  Well nourished, well developed, in no acute distress HEENT: normal Neck: no JVD Vascular: No carotid bruits; Distal pulses 2+ bilaterally Cardiac:  normal S1, S2; IR IR; no murmur  Lungs:  clear to auscultation bilaterally, no wheezing, rhonchi or rales  Abd: soft, nontender, no hepatomegaly  Ext: no edema Musculoskeletal:  No deformities, BUE and BLE strength normal and equal Skin: warm and dry  Neuro:  CNs 2-12 intact, no focal abnormalities noted Psych:  Normal affect   EKG:  The EKG was personally reviewed and demonstrates: On 7/13 he was in atrial fibrillation rate of 115 repeat EKG today on 09/28/2022 reveals atrial fibrillation RVR with rate of 159 Telemetry:  Telemetry was personally reviewed and demonstrates: Atrial fibrillation rates of 90-100  Relevant CV Studies: Echocardiogram ordered and pending  Laboratory Data:  High Sensitivity Troponin:   Recent Labs  Lab 09/26/22 0655 09/27/22 1421  TROPONINIHS 6 11     Chemistry Recent Labs  Lab 09/26/22 0655  NA 133*  K 3.4*  CL 98  CO2 25  GLUCOSE 148*  BUN 15  CREATININE 0.97  CALCIUM 7.4*  GFRNONAA >60   ANIONGAP 10    Recent Labs  Lab 09/26/22 0655  PROT 5.4*  ALBUMIN 2.2*  AST 32  ALT 24  ALKPHOS 113  BILITOT 1.1   Lipids No results for input(s): "CHOL", "TRIG", "HDL", "LABVLDL", "LDLCALC", "CHOLHDL" in the last 168 hours.  Hematology Recent Labs  Lab 09/26/22 0538 09/27/22 1421  WBC 15.7* 21.3*  RBC 4.80 5.29  HGB 13.4 14.7  HCT 41.3 43.4  MCV 86.0 82.0  MCH 27.9 27.8  MCHC 32.4 33.9  RDW 13.4 13.6  PLT 337 341   Thyroid No results for input(s): "TSH", "FREET4" in the last 168 hours.  BNPNo results for input(s): "BNP", "PROBNP" in the last 168 hours.  DDimer No results for input(s): "DDIMER" in the last 168 hours.   Radiology/Studies:  DG Chest Port 1 View  Result Date: 09/28/2022 CLINICAL DATA:  Shortness of breath. EXAM: PORTABLE CHEST 1 VIEW COMPARISON:  September 26, 2022 FINDINGS: Calcific atherosclerotic disease of the aorta. Cardiomediastinal silhouette is enlarged. Mediastinal contours appear intact. Bilateral pleural effusions, right greater than left. No focal airspace consolidation. Osseous structures are without acute abnormality. Soft tissues are grossly normal. IMPRESSION: 1. Bilateral pleural effusions, right greater than left. 2. Enlarged cardiomediastinal silhouette. Electronically Signed   By: Ted Mcalpine M.D.   On: 09/28/2022 15:37   US Abdomen Limited RUQ (LIVER/GB)  Result Date: 09/26/2022 CLINICAL DATA:  Abdominal pain. History of metastatic pancreatic cancer. EXAM: ULTRASOUND ABDOMEN LIMITED RIGHT UPPER QUADRANT COMPARISON:  CT abdomen pelvis from same day. FINDINGS: Gallbladder: No gallstones. Sludge with mild gallbladder wall thickening and small volume pericholecystic fluid. Positive sonographic Murphy sign noted by sonographer. Common bile duct: Diameter: 4 mm, normal.  Common bile duct stent noted. Liver: Liver lesions seen on CT are not well identified by ultrasound. Within normal limits in parenchymal echogenicity. Portal vein is patent on  color Doppler imaging with normal direction of blood flow towards the liver. Other: None. IMPRESSION: 1. Gallbladder sludge with mild gallbladder wall thickening and small volume pericholecystic fluid. Positive sonographic Murphy sign. Findings are concerning for acute acalculous cholecystitis. 2. Liver metastases seen on CT are not well identified by ultrasound. Electronically Signed   By: Obie Dredge M.D.   On: 09/26/2022 14:56   CT ABDOMEN PELVIS W CONTRAST  Result Date: 09/26/2022 CLINICAL DATA:  Central chest pain. Abdominal pain. History of pancreatic cancer. EXAM: CT ABDOMEN AND PELVIS WITH CONTRAST TECHNIQUE: Multidetector CT imaging of the abdomen and pelvis was performed using the standard protocol following bolus administration of intravenous contrast. RADIATION DOSE REDUCTION: This exam was performed according to the departmental dose-optimization program which includes automated exposure control, adjustment of the mA and/or kV according to patient size and/or use of iterative reconstruction technique. CONTRAST:  80mL OMNIPAQUE IOHEXOL 300 MG/ML  SOLN COMPARISON:  Chest radiograph 09/26/2022. Outside CTs from [facility] dated 09/21/2022 and 09/22/2022. Outside images are unavailable but reports are available. FINDINGS: Lower chest: Multiple small pulmonary nodules in the visualized lungs. Index pulmonary nodule is in the right lower lobe along the right major fissure on image 33/4 measuring up to 1.3 cm. There also may be a larger nodule in the left lower lobe measuring 1.4 cm image 15/4. Volume loss and compressive atelectasis in the left lower lobe. Compressive atelectasis in the right lower lobe. Bilateral small pleural effusions. Hepatobiliary: Multiple ill-defined hypoechoic liver lesions compatible with metastatic disease. Index lesion in the left hepatic lobe measuring 2.1 cm on image 23/2. There is gas and contrast within the gallbladder. The gallbladder is moderately distended and findings  compatible with recent ERCP. Metallic biliary stent in the common bile duct. Narrowing in the biliary stent associated with the pancreatic mass. Small amount of pneumobilia. Small amount of fluid and stranding around the liver and gallbladder. Pancreas: Large poorly defined pancreatic head mass with multiple hypoechoic components. Difficult  to accurately measure the mass itself but it roughly measures 4.0 x 4.9 x 5.1 cm. Dilatation of the main pancreatic duct. The mass extends cephalad and appears to be involving the caudate lobe. There is a caudate lesion measuring up to 4.1 cm. In addition, there is hypoechoic implant or fluid collection cephalad to the caudate lobe involving the hepatic subcapsular space. This subcapsular collection or implant measures 7.8 x 4.6 x 5.6 cm. This was present on the previous exam based on the prior report. This caudate/subcapsular component causing mass effect on the adjacent IVC. Mild stranding and edema throughout the porta hepatis region. Spleen: Small splenic calcifications.  Spleen is normal for size. Adrenals/Urinary Tract: Normal adrenal glands. Bilateral cortical and parapelvic renal cysts. No suspicious renal lesions. No hydronephrosis. Bladder is decompressed. There is probably a small bladder diverticulum along the posterior aspect of the bladder. Cannot exclude mild bladder wall thickening. Asymmetric left perinephric stranding. Stomach/Bowel: Normal appearance of the stomach. No bowel dilatation. Colonic diverticula involving the sigmoid colon. No evidence for acute bowel inflammation. Vascular/Lymphatic: Atherosclerotic disease involving the abdominal aorta without aneurysm. Atherosclerotic calcifications at the origin of the SMA. Atherosclerotic calcifications at the origin of the bilateral renal arteries. IVC is patent but there is compression in the suprarenal IVC due to the subcapsular fluid collection / mass. 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  Main portal vein is small but patent. Intrahepatic portal veins are patent. 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 Splenic vein is patent. Pathologic lymphadenopathy in the retrocaval space measuring up to 1.7 cm on image 39/2. Reproductive: Prostate contains calcifications, asymmetric towards the left. Other: Small amount of free fluid in the abdomen and pelvis. Musculoskeletal: Evidence for an old right tenth rib fracture. Bilateral inguinal hernias containing fat. Small amount of fluid in the left inguinal hernia. No suspicious osseous lesion. IMPRESSION: 1. Large pancreatic head mass compatible with the patient's history of pancreatic cancer. The mass extends cephalad and involves the caudate lobe. There is also a subcapsular fluid collection or implant cephalad to the caudate lobe causing mass effect on the adjacent IVC. 2. Multiple liver lesions, abdominal lymphadenopathy and multiple pulmonary nodules. Findings compatible with metastatic disease. 3. Metallic biliary stent in the common bile duct and there is a small amount of pneumobilia. No significant intrahepatic biliary dilatation. In addition, there is moderate distention of the gallbladder containing iodinated contrast and small amount of gas. Findings compatible with recent ERCP. Gallbladder distension could be a source of abdominal pain. 4. Bilateral small pleural effusions with compressive atelectasis in the lower lobes. 5. Small amount of free fluid in the abdomen and pelvis. 6. Colonic diverticulosis without evidence for acute diverticulitis. 7. Aortic Atherosclerosis (ICD10-I70.0). 8. 9 mm splenic artery aneurysm. Electronically Signed   By: Richarda Overlie M.D.   On: 09/26/2022 09:50   DG Chest 2 View  Result Date: 09/26/2022 CLINICAL DATA:  77 year old male with history of central chest pain. EXAM: CHEST - 2 VIEW COMPARISON:  Chest x-ray  07/28/2014. FINDINGS: Lung volumes are low. No consolidative airspace disease. Trace bilateral pleural effusions with bibasilar opacities that are favored to reflect subsegmental atelectasis. No pneumothorax. No evidence of pulmonary edema. No definite suspicious appearing pulmonary nodules or masses are noted. Heart size is normal. Upper mediastinal contours are within normal limits. Atherosclerotic calcifications in the thoracic aorta. IMPRESSION: 1. Low lung volumes with trace  bilateral pleural effusions and bibasilar opacities favored to reflect areas of subsegmental atelectasis. 2. Aortic atherosclerosis. Electronically Signed   By: Trudie Reed M.D.   On: 09/26/2022 06:19     Assessment and Plan:   Atrial fibrillation RVR -Presented to the [facility] emergency department 7/13 with EKG revealing rate controlled atrial fibrillation -During procedure today he went into A-fib RVR with rates of 116 procedure had to be canceled -With longstanding history of A-fib AVR likely cause multifactorial from sepsis, cholecystitis, metastatic adenocarcinoma of the pancreas, hypertension -Echocardiogram ordered and pending with further recommendations to follow -To be started on heparin infusion in place of apixaban will likely need to be transition back to [facility] prior to discharge for CHA2DS2-VASc score of at least 4 for stroke prophylaxis -Given diltiazem 10 mg IVP with orders for diltiazem drip -Recommend avoiding amiodarone infusion as he has been in atrial fibrillation for greater than 48 hours and anticoagulants have been held for IR procedure -Continue with cardiac monitoring  Mild hypokalemia -Serum potassium 3.4 -Potassium supplemented -Patient received dose of IV Lasix -Recommend keeping potassium level greater than 4 less than 5 -Monitor/trend/replete electrolytes as needed -Daily BMP  Diffuse abdominal pain status post recent ERCP with stent placement and Acalculus cholecystitis -Came in with  excruciating abdominal pain -Abdominal CT scan was abnormal, he was tachycardic, tachypneic, elevated white count -Surgical consultation recommended gallbladder drain to be placed by IR -This was placed on hold for procedure -Empiric antibiotics were started -Ultrasound the abdomen revealed gallbladder sludge with mild gallbladder wall thickening and small volume pericholecystic fluid -LFTs remain stable -Continued management by IM and general surgery Primary metastatic adenocarcinoma of the pancreas -Workup completed at [facility] -Oncology consult -Continued management per IM and oncology  Hypotension with a history of hypertension -Blood pressure 100/67 -Improved with fluids -Vital signs per unit protocol -Continue to hold PTA antihypertensives  Type 1 diabetes adult onset -Continued on insulin -Follows with [facility] endocrinology -Continued management per IM  Hyperlipidemia -Continued on statin therapy  Poor p.o. intake/debility -Continue with therapy and dietary support   Risk Assessment/Risk Scores:          CHA2DS2-VASc Score = 4   This indicates a 4.8% annual risk of stroke. The patient's score is based upon: CHF History: 0 HTN History: 1 Diabetes History: 1 Stroke History: 0 Vascular Disease History: 0 Age Score: 2 Gender Score: 0         For questions or updates, please contact [facility] Please consult [email or web address] for contact info under    Signed, Jefte Carithers, NP  09/28/2022 3:48 PM

## 2022-09-28 NOTE — Consult Note (Signed)
NAME:  Cameron Gutierrez, MRN:  [id number], DOB:  [birth year], LOS: 1 ADMISSION DATE:  09/26/2022, CONSULTATION DATE: 09/28/2022 REFERRING MD: Dr. Allena Katz, CHIEF COMPLAINT: Chest Pain   History of Present Illness:  This is a 77 yo male who presented to [facility] ER on 07/13 with acute onset of severe abdominal/chest pain.  He was previously admitted at [facility] from 09/21/22-09/25/22 for abdominal pain along with unintentional weight loss.  During hospitalization at [facility] workup concerning for primary metastatic pancreatic adenocarcinoma.  He underwent ERCP/US with biopsy and stent placed on 09/23/22.  His pain was controlled post procedure, and pt discharged home on 09/25/22 (prescribed oxycodone and eliquis resumed).  ED Course Upon arrival to [facility] ER significant lab results were: Na+ 133/K+ 3.4/glucose 148/calcium 7.4/albumin 2.2/wbc 15.7.  CT Abd Pelvis concerning for moderate distension of the gallbladder but no findings of acute cholecystitis, however pt continued to c/o persistent abdominal pain.  EDP consulted with [facility] Oncologist Dr. Everardo All, and following review of imaging did not feel pt required transfer to [facility]  Pt subsequently admitted to [facility] medsurg unit per hospitalist team for additional workup and treatment due to continued abdominal pain.  See detailed hospital course below under significant events.  CT Abd/Pelvis W Contrast:  Large pancreatic head mass compatible with the patient's history of pancreatic cancer. The mass extends cephalad and involves the caudate lobe. There is also a subcapsular fluid collection or implant cephalad to the caudate lobe causing mass effect on the adjacent IVC. Multiple liver lesions, abdominal lymphadenopathy and multiple pulmonary nodules. Findings compatible with metastatic disease. Metallic biliary stent in the common bile duct and there is a small amount of pneumobilia. No significant intrahepatic biliary dilatation. In addition, there is moderate distention of  the gallbladder containing iodinated contrast and small amount of gas. Findings compatible with recent ERCP. Gallbladder distension could be a source of abdominal pain. Bilateral small pleural effusions with compressive atelectasis in the lower lobes. Small amount of free fluid in the abdomen and pelvis. Colonic diverticulosis without evidence for acute diverticulitis. Aortic Atherosclerosis (ICD10-I70.0). 9 mm splenic artery aneurysm.  CXR:  Low lung volumes with trace bilateral pleural effusions and bibasilar opacities favored to reflect areas of subsegmental atelectasis. Aortic atherosclerosis.  Pertinent  Medical History  Atrial Fibrillation  Primary Metastatic Pancreatic Adenocarcinoma  Type I Diabetes Mellitus  Hypercholesteremia  HTN   Significant Hospital Events: Including procedures, antibiotic start and stop dates in addition to other pertinent events   07/13: Pt admitted to the [facility] [address] unit with diffuse abdominal pain following recent ERCP with stent placement and newly diagnosed primary pancreatic adenocarcinoma with metastasis 07/13: [place] Abd Limited RUQ revealed Gallbladder sludge with mild gallbladder           wall thickening and small volume pericholecystic fluid. Positive sonographic        Murphy sign. Findings are concerning for acute acalculous cholecystitis. Liver        metastases seen on CT are not well identified by ultrasound. 07/14: General surgery consulted for acute acalculous cholecystitis and recommended decompression of the gallbladder via percutaneous cholecystostomy per IR and continue abx therapy.  07/15: Pt arrived to special recovery for cholecystostomy drain placement and pt noted to be in atrial fibrillation with rvr heart rate 140 to 170's.  Cardiology notified pt received 5 mg of iv cardizem x1 dose/250 ml NS bolus/20 mg of iv lasix.  Procedure canceled and pt transferred to the stepdown unit for cardizem gtt.  PCCM team consulted  to assist with management.     Anti-infectives (From admission, onward)    Start     Dose/Rate Route Frequency Ordered Stop   09/27/22 1000  Ampicillin-Sulbactam (UNASYN) 3 g in sodium chloride 0.9 % 100 mL IVPB        3 g 200 mL/hr over 30 Minutes Intravenous Every 6 hours 09/27/22 0844         Interim History / Subjective:  Pt currently requiring neo-synephrine gtt @ 20 mcg/min to maintain map >65. Cardizem gtt not infusing at this time pt remains in atrial fibrillation heart rate 114 to 120's   Objective   Blood pressure (!) 85/65, pulse (!) 126, temperature 97.6 F (36.4 C), temperature source Oral, resp. rate (!) 25, height 6' (1.829 m), weight 73.9 kg, SpO2 94%.        Intake/Output Summary (Last 24 hours) at 09/28/2022 1747 Last data filed at 09/28/2022 1734 Gross per 24 hour  Intake 2500.4 ml  Output 50 ml  Net 2450.4 ml   Filed Weights   09/26/22 0519  Weight: 73.9 kg    Examination: General: Acute on chronically-ill appearing male, NAD on 6L HFNC  HENT: Supple, no JVD  Lungs: Diminished throughout, even, non labored  Cardiovascular: Irregular irregular, no r/g, 2+ radial/1+ distal pulses, no edema   Abdomen: Hypoactive BS x4, mild abdominal tenderness with distention, taut  Extremities: Normal bulk and tone, moves all extremities  Neuro: Alert and oriented, following commands, PERRLA GU: Indwelling foley catheter in place   Resolved Hospital Problem list     Assessment & Plan:   #Acute hypoxic respiratory failure secondary secondary to bilateral pleural effusions and atelectasis  - Supplemental O2 for dyspnea and/or hypoxia  - Maintain O2 sats 92% or higher  - Prn bronchodilator therapy  - Aggressive pulmonary hygiene - Intermittent CXR and ABG's  - Consider thoracentesis   #Atrial fibrillation with rvr  #Hypotension secondary to antiarrhythmic medication  - Continuous telemetry monitoring - Will check troponin and BNP  - Cardiology consulted appreciate input: recommending if  renal function normal can consider digoxin, however if pt has evidence of renal failure recommend amiodarone gtt due to cardizem gtt causing hypotension  - Echo pending  - Continue heparin gtt: dosing per pharmacy  - Prn neo-synephrine gtt to maintain map >65 - Hold outpatient antihypertensives   #Acalculous cholecystitis  - Trend WBC and monitor fever curve  - Continue  abx therapy as outlined above  - General surgery consulted: recommended decompression of the gallbladder via percutaneous cholecystostomy per IR and continue abx therapy.  Procedure canceled 07/15 due to pt developing atrial fibrillation with rvr  - Prn oxycodone/fentanyl/morphine for pain management   #Primary metastatic adenocarcinoma of the pancreas  - Oncology consulted appreciate input   #Type I diabetes mellitus  - CBG's ac/hs - SSI   Best Practice (right click and "Reselect all SmartList Selections" daily)   Diet/type: clear liquids DVT prophylaxis: systemic heparin GI prophylaxis: N/A Lines: N/A Foley:  Yes, and it is still needed Code Status:  full code Last date of multidisciplinary goals of care discussion [N/A]  Labs   CBC: Recent Labs  Lab 09/26/22 0538 09/27/22 1421 09/28/22 1607  WBC 15.7* 21.3* 18.7*  HGB 13.4 14.7 12.9*  HCT 41.3 43.4 36.5*  MCV 86.0 82.0 79.5*  PLT 337 341 327    Basic Metabolic Panel: Recent Labs  Lab 09/26/22 0655  NA 133*  K 3.4*  CL 98  CO2 25  GLUCOSE  148*  BUN 15  CREATININE 0.97  CALCIUM 7.4*   GFR: Estimated Creatinine Clearance: 68.8 mL/min (by C-G formula based on SCr of 0.97 mg/dL). Recent Labs  Lab 09/26/22 0538 09/27/22 1421 09/28/22 1607  WBC 15.7* 21.3* 18.7*    Liver Function Tests: Recent Labs  Lab 09/26/22 0655  AST 32  ALT 24  ALKPHOS 113  BILITOT 1.1  PROT 5.4*  ALBUMIN 2.2*   Recent Labs  Lab 09/26/22 0655  LIPASE 19   No results for input(s): "AMMONIA" in the last 168 hours.  ABG    Component Value Date/Time    HCO3 27.9 03/26/2022 2105   O2SAT 22.9 03/26/2022 2105     Coagulation Profile: Recent Labs  Lab 09/27/22 1421 09/28/22 0916  INR 1.7* 1.7*    Cardiac Enzymes: No results for input(s): "CKTOTAL", "CKMB", "CKMBINDEX", "TROPONINI" in the last 168 hours.  HbA1C: Hgb A1c MFr Bld  Date/Time Value Ref Range Status  09/26/2022 12:54 PM 7.0 (H) 4.8 - 5.6 % Final    Comment:    (NOTE)         Prediabetes: 5.7 - 6.4         Diabetes: >6.4         Glycemic control for adults with diabetes: <7.0   03/27/2022 05:49 AM 11.8 (H) 4.8 - 5.6 % Final    Comment:    (NOTE) Pre diabetes:          5.7%-6.4%  Diabetes:              >6.4%  Glycemic control for   <7.0% adults with diabetes     CBG: Recent Labs  Lab 09/27/22 2140 09/28/22 0835 09/28/22 1124 09/28/22 1444 09/28/22 1559  GLUCAP 139* 154* 117* 146* 156*    Review of Systems:   Gen: Denies fever, chills, weight change, fatigue, night sweats HEENT: Denies blurred vision, double vision, hearing loss, tinnitus, sinus congestion, rhinorrhea, sore throat, neck stiffness, dysphagia PULM: Denies shortness of breath, cough, sputum production, hemoptysis, wheezing CV: Denies chest pain, edema, orthopnea, paroxysmal nocturnal dyspnea, palpitations GI: abdominal pain, nausea, vomiting, diarrhea, hematochezia, melena, constipation, change in bowel habits GU: Denies dysuria, hematuria, polyuria, oliguria, urethral discharge Endocrine: Denies hot or cold intolerance, polyuria, polyphagia or appetite change Derm: Denies rash, dry skin, scaling or peeling skin change Heme: Denies easy bruising, bleeding, bleeding gums Neuro: Denies headache, numbness, weakness, slurred speech, loss of memory or consciousness   Past Medical History:  He,  has a past medical history of Atrial fibrillation (HCC), Cancer (HCC), Diabetes mellitus without complication (HCC), Hypercholesteremia, and Hypertension.   Surgical History:   Past Surgical  History:  Procedure Laterality Date   KNEE SURGERY     REPLACEMENT TOTAL KNEE Right    SHOULDER SURGERY       Social History:   reports that he has never smoked. He has never used smokeless tobacco. He reports that he does not currently use alcohol. He reports that he does not use drugs.   Family History:  His family history is not on file.   Allergies No Known Allergies   Home Medications  Prior to Admission medications   Medication Sig Start Date End Date Taking? Authorizing Provider  amLODipine (NORVASC) 10 MG tablet Take 10 mg by mouth daily. 03/20/21  Yes [provider]  atorvastatin (LIPITOR) 40 MG tablet Take 40 mg by mouth every morning. 03/20/21  Yes [provider]  ELIQUIS 5 MG TABS tablet Take 5 mg  by mouth 2 (two) times daily. 03/20/21  Yes [provider]  insulin glargine (LANTUS) 100 UNIT/ML injection Inject 18 Units into the skin daily.   Yes [provider]  lisinopril (ZESTRIL) 5 MG tablet Take 5 mg by mouth every morning. 03/02/21  Yes [provider]  oxyCODONE (OXY IR/ROXICODONE) 5 MG immediate release tablet Take 5 mg by mouth every 6 (six) hours as needed for severe pain.   Yes [provider]  blood glucose meter kit and supplies KIT Dispense based on patient and insurance preference. Use up to four times daily as directed. 03/27/22   Sherryll Burger, Pratik D, DO  Insulin Pen Needle (PEN NEEDLES 3/16") 31G X 5 MM MISC Take as directed with insulin pen. 03/27/22   Maurilio Lovely D, DO     Critical care time: 60 minutes     Zada Girt, [place]  Pulmonary/Critical Care Pager [phone] (please enter 7 digits) PCCM Consult Pager [phone] (please enter 7 digits)

## 2022-09-28 NOTE — Consult Note (Addendum)
Chief Complaint: Patient was seen in consultation today for acute cholecystitis  Referring Physician(s): Campbell Lerner, MD  Supervising Physician: Pernell Dupre  Patient Status: [facility] - In-pt  History of Present Illness: Cameron Gutierrez is a 77 y.o. male with PMH significant for atrial fibrillation, CKD stage 3b, newly diagnosed pancreatic cancer, type I diabetes mellitus, hypertension, and recent biliary stenting at [facility]. Patient presented to [facility] on 09/26/22 with abdominal pain. Work-up revealed concern for acalculous acute cholecystitis. IR was consulted to evaluate patient for possible percutaneous cholecystostomy drain as surgery team feels patient is not a good candidate for cholecystectomy.  Past Medical History:  Diagnosis Date   Atrial fibrillation (HCC)    Cancer (HCC)    pancreatic   Diabetes mellitus without complication (HCC)    Hypercholesteremia    Hypertension     Past Surgical History:  Procedure Laterality Date   KNEE SURGERY     REPLACEMENT TOTAL KNEE Right    SHOULDER SURGERY      Allergies: Patient has no known allergies.  Medications: Prior to Admission medications   Medication Sig Start Date End Date Taking? Authorizing Provider  amLODipine (NORVASC) 10 MG tablet Take 10 mg by mouth daily. 03/20/21  Yes [provider]  atorvastatin (LIPITOR) 40 MG tablet Take 40 mg by mouth every morning. 03/20/21  Yes [provider]  ELIQUIS 5 MG TABS tablet Take 5 mg by mouth 2 (two) times daily. 03/20/21  Yes [provider]  insulin glargine (LANTUS) 100 UNIT/ML injection Inject 18 Units into the skin daily.   Yes [provider]  lisinopril (ZESTRIL) 5 MG tablet Take 5 mg by mouth every morning. 03/02/21  Yes [provider]  oxyCODONE (OXY IR/ROXICODONE) 5 MG immediate release tablet Take 5 mg by mouth every 6 (six) hours as needed for severe pain.   Yes [provider]  blood glucose meter kit and  supplies KIT Dispense based on patient and insurance preference. Use up to four times daily as directed. 03/27/22   Sherryll Burger, Pratik D, DO  Insulin Pen Needle (PEN NEEDLES 3/16") 31G X 5 MM MISC Take as directed with insulin pen. 03/27/22   Maurilio Lovely D, DO     History reviewed. No pertinent family history.  Social History   Socioeconomic History   Marital status: Married    Spouse name: Not on file   Number of children: Not on file   Years of education: Not on file   Highest education level: Not on file  Occupational History   Not on file  Tobacco Use   Smoking status: Never   Smokeless tobacco: Never  Substance and Sexual Activity   Alcohol use: Not Currently   Drug use: Never   Sexual activity: Not on file  Other Topics Concern   Not on file  Social History Narrative   ** Merged History Encounter **       Social Determinants of Health   Financial Resource Strain: Low Risk  (09/21/2022)   Received from [facility]   Overall Financial Resource Strain (CARDIA)    Difficulty of Paying Living Expenses: Not very hard  Food Insecurity: No Food Insecurity (09/26/2022)   Hunger Vital Sign    Worried About Running Out of Food in the Last Year: Never true    Ran Out of Food in the Last Year: Never true  Transportation Needs: No Transportation Needs (09/26/2022)   PRAPARE - Transportation    Lack of Transportation (  Medical): No    Lack of Transportation (Non-Medical): No  Physical Activity: Not on file  Stress: No Stress Concern Present (09/21/2022)   Received from [facility] - Occupational Stress Questionnaire    Feeling of Stress : Not at all  Social Connections: Not on file    Code Status: Full code  Review of Systems: A 12 point ROS discussed and pertinent positives are indicated in the HPI above.  All other systems are negative.  Review of Systems  Constitutional:  Positive for fever. Negative for chills.  Respiratory:   Negative for chest tightness and shortness of breath.   Cardiovascular:  Negative for chest pain and leg swelling.  Gastrointestinal:  Positive for abdominal pain, nausea and vomiting. Negative for diarrhea.  Neurological:  Negative for dizziness and headaches.  Psychiatric/Behavioral:  Negative for confusion.     Vital Signs: BP 111/69 (BP Location: Left Arm)   Pulse 69   Temp 98.2 F (36.8 C) (Oral)   Resp 17   Ht 6' (1.829 m)   Wt 163 lb (73.9 kg)   SpO2 92%   BMI 22.11 kg/m   Advance Care Plan: The advanced care plan/surrogate decision maker was discussed at the time of visit and documented in the medical record.  Patient indicated his wife, Casmir Auguste, is his surrogate Management consultant.  Physical Exam Vitals reviewed.  Constitutional:      General: He is not in acute distress.    Appearance: He is ill-appearing.  Cardiovascular:     Rate and Rhythm: Normal rate. Rhythm irregular.     Pulses: Normal pulses.     Heart sounds: Normal heart sounds.     Comments: Patient with known hx of A-fib Pulmonary:     Effort: Pulmonary effort is normal.     Breath sounds: Normal breath sounds.  Abdominal:     Palpations: Abdomen is soft.     Tenderness: There is abdominal tenderness. There is no rebound.     Comments: RUQ tenderness  Musculoskeletal:     Right lower leg: No edema.     Left lower leg: No edema.  Skin:    General: Skin is warm and dry.  Neurological:     Mental Status: He is alert and oriented to person, place, and time.  Psychiatric:        Mood and Affect: Mood normal.        Behavior: Behavior normal.        Thought Content: Thought content normal.        Judgment: Judgment normal.     Imaging: US Abdomen Limited RUQ (LIVER/GB)  Result Date: 09/26/2022 CLINICAL DATA:  Abdominal pain. History of metastatic pancreatic cancer. EXAM: ULTRASOUND ABDOMEN LIMITED RIGHT UPPER QUADRANT COMPARISON:  CT abdomen pelvis from same day. FINDINGS: Gallbladder: No  gallstones. Sludge with mild gallbladder wall thickening and small volume pericholecystic fluid. Positive sonographic Murphy sign noted by sonographer. Common bile duct: Diameter: 4 mm, normal.  Common bile duct stent noted. Liver: Liver lesions seen on CT are not well identified by ultrasound. Within normal limits in parenchymal echogenicity. Portal vein is patent on color Doppler imaging with normal direction of blood flow towards the liver. Other: None. IMPRESSION: 1. Gallbladder sludge with mild gallbladder wall thickening and small volume pericholecystic fluid. Positive sonographic Murphy sign. Findings are concerning for acute acalculous cholecystitis. 2. Liver metastases seen on CT are not well identified by ultrasound. Electronically Signed  By: Obie Dredge M.D.   On: 09/26/2022 14:56   CT ABDOMEN PELVIS W CONTRAST  Result Date: 09/26/2022 CLINICAL DATA:  Central chest pain. Abdominal pain. History of pancreatic cancer. EXAM: CT ABDOMEN AND PELVIS WITH CONTRAST TECHNIQUE: Multidetector CT imaging of the abdomen and pelvis was performed using the standard protocol following bolus administration of intravenous contrast. RADIATION DOSE REDUCTION: This exam was performed according to the departmental dose-optimization program which includes automated exposure control, adjustment of the mA and/or kV according to patient size and/or use of iterative reconstruction technique. CONTRAST:  80mL OMNIPAQUE IOHEXOL 300 MG/ML  SOLN COMPARISON:  Chest radiograph 09/26/2022. Outside CTs from [facility] dated 09/21/2022 and 09/22/2022. Outside images are unavailable but reports are available. FINDINGS: Lower chest: Multiple small pulmonary nodules in the visualized lungs. Index pulmonary nodule is in the right lower lobe along the right major fissure on image 33/4 measuring up to 1.3 cm. There also may be a larger nodule in the left lower lobe measuring 1.4 cm image 15/4. Volume loss and compressive atelectasis in the left  lower lobe. Compressive atelectasis in the right lower lobe. Bilateral small pleural effusions. Hepatobiliary: Multiple ill-defined hypoechoic liver lesions compatible with metastatic disease. Index lesion in the left hepatic lobe measuring 2.1 cm on image 23/2. There is gas and contrast within the gallbladder. The gallbladder is moderately distended and findings compatible with recent ERCP. Metallic biliary stent in the common bile duct. Narrowing in the biliary stent associated with the pancreatic mass. Small amount of pneumobilia. Small amount of fluid and stranding around the liver and gallbladder. Pancreas: Large poorly defined pancreatic head mass with multiple hypoechoic components. Difficult to accurately measure the mass itself but it roughly measures 4.0 x 4.9 x 5.1 cm. Dilatation of the main pancreatic duct. The mass extends cephalad and appears to be involving the caudate lobe. There is a caudate lesion measuring up to 4.1 cm. In addition, there is hypoechoic implant or fluid collection cephalad to the caudate lobe involving the hepatic subcapsular space. This subcapsular collection or implant measures 7.8 x 4.6 x 5.6 cm. This was present on the previous exam based on the prior report. This caudate/subcapsular component causing mass effect on the adjacent IVC. Mild stranding and edema throughout the porta hepatis region. Spleen: Small splenic calcifications.  Spleen is normal for size. Adrenals/Urinary Tract: Normal adrenal glands. Bilateral cortical and parapelvic renal cysts. No suspicious renal lesions. No hydronephrosis. Bladder is decompressed. There is probably a small bladder diverticulum along the posterior aspect of the bladder. Cannot exclude mild bladder wall thickening. Asymmetric left perinephric stranding. Stomach/Bowel: Normal appearance of the stomach. No bowel dilatation. Colonic diverticula involving the sigmoid colon. No evidence for acute bowel inflammation. Vascular/Lymphatic:  Atherosclerotic disease involving the abdominal aorta without aneurysm. Atherosclerotic calcifications at the origin of the SMA. Atherosclerotic calcifications at the origin of the bilateral renal arteries. IVC is patent but there is compression in the suprarenal IVC due to the subcapsular fluid collection / mass. 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 Main portal vein is small but patent. Intrahepatic portal veins are patent. 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 Splenic vein is patent. Pathologic lymphadenopathy in the retrocaval space measuring up to 1.7 cm on image 39/2. Reproductive: Prostate contains calcifications, asymmetric towards the left. Other: Small amount of free fluid in the abdomen and pelvis. Musculoskeletal: Evidence for an old right  tenth rib fracture. Bilateral inguinal hernias containing fat. Small amount of fluid in the left inguinal hernia. No suspicious osseous lesion. IMPRESSION: 1. Large pancreatic head mass compatible with the patient's history of pancreatic cancer. The mass extends cephalad and involves the caudate lobe. There is also a subcapsular fluid collection or implant cephalad to the caudate lobe causing mass effect on the adjacent IVC. 2. Multiple liver lesions, abdominal lymphadenopathy and multiple pulmonary nodules. Findings compatible with metastatic disease. 3. Metallic biliary stent in the common bile duct and there is a small amount of pneumobilia. No significant intrahepatic biliary dilatation. In addition, there is moderate distention of the gallbladder containing iodinated contrast and small amount of gas. Findings compatible with recent ERCP. Gallbladder distension could be a source of abdominal pain. 4. Bilateral small pleural effusions with compressive atelectasis in the lower lobes. 5. Small amount of free fluid in the abdomen and pelvis. 6.  Colonic diverticulosis without evidence for acute diverticulitis. 7. Aortic Atherosclerosis (ICD10-I70.0). 8. 9 mm splenic artery aneurysm. Electronically Signed   By: Richarda Overlie M.D.   On: 09/26/2022 09:50   DG Chest 2 View  Result Date: 09/26/2022 CLINICAL DATA:  77 year old male with history of central chest pain. EXAM: CHEST - 2 VIEW COMPARISON:  Chest x-ray 07/28/2014. FINDINGS: Lung volumes are low. No consolidative airspace disease. Trace bilateral pleural effusions with bibasilar opacities that are favored to reflect subsegmental atelectasis. No pneumothorax. No evidence of pulmonary edema. No definite suspicious appearing pulmonary nodules or masses are noted. Heart size is normal. Upper mediastinal contours are within normal limits. Atherosclerotic calcifications in the thoracic aorta. IMPRESSION: 1. Low lung volumes with trace bilateral pleural effusions and bibasilar opacities favored to reflect areas of subsegmental atelectasis. 2. Aortic atherosclerosis. Electronically Signed   By: Trudie Reed M.D.   On: 09/26/2022 06:19    Labs:  CBC: Recent Labs    03/26/22 2033 03/27/22 0549 09/26/22 0538 09/27/22 1421  WBC 5.4 5.3 15.7* 21.3*  HGB 14.6 14.4 13.4 14.7  HCT 43.1 43.2 41.3 43.4  PLT 202 126* 337 341    COAGS: Recent Labs    09/27/22 1421  INR 1.7*    BMP: Recent Labs    03/26/22 2033 03/27/22 0549 09/26/22 0655  NA 126* 136 133*  K 4.3 3.5 3.4*  CL 94* 104 98  CO2 21* 22 25  GLUCOSE 697* 140* 148*  BUN 27* 20 15  CALCIUM 9.4 8.7* 7.4*  CREATININE 1.68* 1.16 0.97  GFRNONAA 42* >60 >60    LIVER FUNCTION TESTS: Recent Labs    03/27/22 0549 09/26/22 0655  BILITOT 0.8 1.1  AST 19 32  ALT 20 24  ALKPHOS 67 113  PROT 6.0* 5.4*  ALBUMIN 3.3* 2.2*    TUMOR MARKERS: No results for input(s): "AFPTM", "CEA", "CA199", "CHROMGRNA" in the last 8760 hours.  Assessment and Plan:  Nemesio Castrillon is a 77 yo male being seen today for acute acalculous  cholecystitis. Patient has a recent diagnosis of pancreatic cancer and had recent ERCP with CBD stent placement at [facility]. Patient subsequently was discharged from [facility] and presented to [facility] ED on 09/26/22 where work-up revealed concern for acute cholecystitis.  Case reviewed and approved by Dr Fredia Sorrow for image-guided percutaneous cholecystostomy placement on 09/28/22. Patient is NPO. Patient has had their Eliquis held with last dose on 09/26/22. INR on 7/14 was 1.7, awaiting repeat INR today.  Risks and benefits discussed with the patient including, but not limited to bleeding, infection, gallbladder  perforation, bile leak, sepsis or even death.  All of the patient's questions were answered, patient is agreeable to proceed. Consent signed and in chart.   Thank you for this interesting consult.  I greatly enjoyed meeting RORY XIANG and look forward to participating in their care.  A copy of this report was sent to the requesting provider on this date.  Electronically Signed: Kennieth Francois, PA-C 09/28/2022, 8:39 AM   I spent a total of 20 Minutes  in face to face in clinical consultation, greater than 50% of which was counseling/coordinating care for acute cholecystitis.

## 2022-09-28 NOTE — Progress Notes (Addendum)
Pt went into Rapid Afib right before the procedure. HR 160's BP soft Gave metoprolol 5 mg  IV x1 CXR shows pulmonary vascular congestion--gave lasix 20 mg IV once Check echo, labs Consulted Dr Kirke Corin to see pt. Agrees with starting heparin gtt--high risk for CVA and blood clot. D/w pt and wife debbie on the phone Start IV diltiazem gtt. May need pressors   Stepdown bed requested. Time 35 mins

## 2022-09-29 ENCOUNTER — Inpatient Hospital Stay: Payer: Medicare Other

## 2022-09-29 ENCOUNTER — Inpatient Hospital Stay (HOSPITAL_COMMUNITY)
Admit: 2022-09-29 | Discharge: 2022-09-29 | Disposition: A | Discharge status: Home / Self Care | Payer: Medicare Other | Attending: Internal Medicine | Admitting: Internal Medicine

## 2022-09-29 DIAGNOSIS — J90 Pleural effusion, not elsewhere classified: Secondary | ICD-10-CM | POA: Diagnosis not present

## 2022-09-29 DIAGNOSIS — Z515 Encounter for palliative care: Secondary | ICD-10-CM

## 2022-09-29 DIAGNOSIS — K810 Acute cholecystitis: Secondary | ICD-10-CM | POA: Diagnosis not present

## 2022-09-29 DIAGNOSIS — I4891 Unspecified atrial fibrillation: Secondary | ICD-10-CM

## 2022-09-29 DIAGNOSIS — C259 Malignant neoplasm of pancreas, unspecified: Secondary | ICD-10-CM | POA: Diagnosis not present

## 2022-09-29 DIAGNOSIS — A419 Sepsis, unspecified organism: Secondary | ICD-10-CM | POA: Diagnosis not present

## 2022-09-29 DIAGNOSIS — R6521 Severe sepsis with septic shock: Secondary | ICD-10-CM

## 2022-09-29 LAB — URINALYSIS, COMPLETE (UACMP) WITH MICROSCOPIC
Bilirubin Urine: NEGATIVE
Glucose, UA: NEGATIVE mg/dL
Ketones, ur: NEGATIVE mg/dL
Leukocytes,Ua: NEGATIVE
Nitrite: NEGATIVE
Protein, ur: 30 mg/dL — AB
Specific Gravity, Urine: 1.019 (ref 1.005–1.030)
pH: 5 (ref 5.0–8.0)

## 2022-09-29 LAB — ECHOCARDIOGRAM COMPLETE
AR max vel: 2.81 cm2
AV Area VTI: 3.27 cm2
AV Area mean vel: 2.73 cm2
AV Mean grad: 3 mmHg
AV Peak grad: 4.7 mmHg
Ao pk vel: 1.08 m/s
Area-P 1/2: 3.63 cm2
Height: 72 in
S' Lateral: 2.6 cm
Weight: 2608 oz

## 2022-09-29 LAB — COMPREHENSIVE METABOLIC PANEL
ALT: 106 U/L — ABNORMAL HIGH (ref 0–44)
AST: 222 U/L — ABNORMAL HIGH (ref 15–41)
Albumin: 2 g/dL — ABNORMAL LOW (ref 3.5–5.0)
Alkaline Phosphatase: 192 U/L — ABNORMAL HIGH (ref 38–126)
Anion gap: 17 — ABNORMAL HIGH (ref 5–15)
BUN: 85 mg/dL — ABNORMAL HIGH (ref 8–23)
CO2: 20 mmol/L — ABNORMAL LOW (ref 22–32)
Calcium: 7 mg/dL — ABNORMAL LOW (ref 8.9–10.3)
Chloride: 97 mmol/L — ABNORMAL LOW (ref 98–111)
Creatinine, Ser: 3.17 mg/dL — ABNORMAL HIGH (ref 0.61–1.24)
GFR, Estimated: 20 mL/min — ABNORMAL LOW (ref >60)
Glucose, Bld: 176 mg/dL — ABNORMAL HIGH (ref 70–99)
Potassium: 3.7 mmol/L (ref 3.5–5.1)
Sodium: 134 mmol/L — ABNORMAL LOW (ref 135–145)
Total Bilirubin: 1.6 mg/dL — ABNORMAL HIGH (ref 0.3–1.2)
Total Protein: 5.6 g/dL — ABNORMAL LOW (ref 6.5–8.1)

## 2022-09-29 LAB — CBC
HCT: 37.3 % — ABNORMAL LOW (ref 39.0–52.0)
Hemoglobin: 12.8 g/dL — ABNORMAL LOW (ref 13.0–17.0)
MCH: 27.9 pg (ref 26.0–34.0)
MCHC: 34.3 g/dL (ref 30.0–36.0)
MCV: 81.3 fL (ref 80.0–100.0)
Platelets: 333 10*3/uL (ref 150–400)
RBC: 4.59 MIL/uL (ref 4.22–5.81)
RDW: 13.7 % (ref 11.5–15.5)
WBC: 20.7 10*3/uL — ABNORMAL HIGH (ref 4.0–10.5)
nRBC: 0 % (ref 0.0–0.2)

## 2022-09-29 LAB — BODY FLUID CELL COUNT WITH DIFFERENTIAL
Eos, Fluid: 0 %
Lymphs, Fluid: 7 %
Monocyte-Macrophage-Serous Fluid: 8 %
Neutrophil Count, Fluid: 85 %
Total Nucleated Cell Count, Fluid: 4015 cu mm

## 2022-09-29 LAB — PROTIME-INR
INR: 2 — ABNORMAL HIGH (ref 0.8–1.2)
Prothrombin Time: 22.5 seconds — ABNORMAL HIGH (ref 11.4–15.2)

## 2022-09-29 LAB — PROTEIN, PLEURAL OR PERITONEAL FLUID: Total protein, fluid: <3 g/dL

## 2022-09-29 LAB — LACTATE DEHYDROGENASE: LDH: 374 U/L — ABNORMAL HIGH (ref 98–192)

## 2022-09-29 LAB — GLUCOSE, CAPILLARY
Glucose-Capillary: 125 mg/dL — ABNORMAL HIGH (ref 70–99)
Glucose-Capillary: 128 mg/dL — ABNORMAL HIGH (ref 70–99)
Glucose-Capillary: 145 mg/dL — ABNORMAL HIGH (ref 70–99)
Glucose-Capillary: 153 mg/dL — ABNORMAL HIGH (ref 70–99)
Glucose-Capillary: 155 mg/dL — ABNORMAL HIGH (ref 70–99)
Glucose-Capillary: 170 mg/dL — ABNORMAL HIGH (ref 70–99)

## 2022-09-29 LAB — MAGNESIUM: Magnesium: 2.4 mg/dL (ref 1.7–2.4)

## 2022-09-29 LAB — URIC ACID: Uric Acid, Serum: 7.8 mg/dL (ref 3.7–8.6)

## 2022-09-29 LAB — HEPATITIS C ANTIBODY: HCV Ab: NONREACTIVE

## 2022-09-29 LAB — PROTEIN / CREATININE RATIO, URINE
Creatinine, Urine: 72 mg/dL
Protein Creatinine Ratio: 0.75 mg/mg{Cre} — ABNORMAL HIGH (ref 0.00–0.15)
Total Protein, Urine: 54 mg/dL

## 2022-09-29 LAB — HEPATITIS B SURFACE ANTIGEN: Hepatitis B Surface Ag: NONREACTIVE

## 2022-09-29 LAB — FIBRINOGEN: Fibrinogen: >800 mg/dL — ABNORMAL HIGH (ref 210–475)

## 2022-09-29 LAB — APTT
aPTT: 109 seconds — ABNORMAL HIGH (ref 24–36)
aPTT: 100 seconds — ABNORMAL HIGH (ref 24–36)
aPTT: 128 seconds — ABNORMAL HIGH (ref 24–36)

## 2022-09-29 LAB — D-DIMER, QUANTITATIVE: D-Dimer, Quant: 7.06 ug/mL-FEU — ABNORMAL HIGH (ref 0.00–0.50)

## 2022-09-29 LAB — LACTATE DEHYDROGENASE, PLEURAL OR PERITONEAL FLUID: LD, Fluid: 533 U/L — ABNORMAL HIGH (ref 3–23)

## 2022-09-29 LAB — GLUCOSE, PLEURAL OR PERITONEAL FLUID: Glucose, Fluid: 138 mg/dL

## 2022-09-29 LAB — HEPATITIS B CORE ANTIBODY, TOTAL: Hep B Core Total Ab: NONREACTIVE

## 2022-09-29 LAB — HEPARIN LEVEL (UNFRACTIONATED): Heparin Unfractionated: 0.71 [IU]/mL — ABNORMAL HIGH (ref 0.30–0.70)

## 2022-09-29 LAB — HEPATITIS B CORE ANTIBODY, IGM: Hep B C IgM: NONREACTIVE

## 2022-09-29 LAB — HIV ANTIBODY (ROUTINE TESTING W REFLEX): HIV Screen 4th Generation wRfx: NONREACTIVE

## 2022-09-29 LAB — PHOSPHORUS: Phosphorus: 8.8 mg/dL — ABNORMAL HIGH (ref 2.5–4.6)

## 2022-09-29 MED ORDER — CALCIUM ACETATE (PHOS BINDER) 667 MG PO CAPS
1334.0000 mg | ORAL_CAPSULE | Freq: Three times a day (TID) | ORAL | Status: AC
  Administered 2022-09-29: 1334 mg via ORAL
  Filled 2022-09-29 (×6): qty 2

## 2022-09-29 MED ORDER — INSULIN ASPART 100 UNIT/ML IJ SOLN
0.0000 [IU] | INTRAMUSCULAR | Status: DC
  Administered 2022-09-30 – 2022-10-03 (×16): 1 [IU] via SUBCUTANEOUS
  Administered 2022-10-03: 2 [IU] via SUBCUTANEOUS
  Administered 2022-10-04 – 2022-10-05 (×8): 1 [IU] via SUBCUTANEOUS
  Administered 2022-10-05 (×2): 2 [IU] via SUBCUTANEOUS
  Administered 2022-10-05: 1 [IU] via SUBCUTANEOUS
  Administered 2022-10-06: 2 [IU] via SUBCUTANEOUS
  Administered 2022-10-06: 1 [IU] via SUBCUTANEOUS
  Administered 2022-10-06 (×2): 2 [IU] via SUBCUTANEOUS
  Administered 2022-10-06: 1 [IU] via SUBCUTANEOUS
  Administered 2022-10-07 (×3): 2 [IU] via SUBCUTANEOUS
  Administered 2022-10-07 (×3): 1 [IU] via SUBCUTANEOUS
  Administered 2022-10-08 (×3): 2 [IU] via SUBCUTANEOUS
  Administered 2022-10-08: 1 [IU] via SUBCUTANEOUS
  Administered 2022-10-08: 2 [IU] via SUBCUTANEOUS
  Administered 2022-10-08: 3 [IU] via SUBCUTANEOUS
  Administered 2022-10-09: 2 [IU] via SUBCUTANEOUS
  Administered 2022-10-09: 1 [IU] via SUBCUTANEOUS
  Administered 2022-10-09 (×4): 2 [IU] via SUBCUTANEOUS
  Administered 2022-10-10 (×2): 1 [IU] via SUBCUTANEOUS
  Administered 2022-10-10 (×3): 2 [IU] via SUBCUTANEOUS
  Administered 2022-10-10 – 2022-10-11 (×5): 1 [IU] via SUBCUTANEOUS
  Administered 2022-10-11 – 2022-10-12 (×2): 2 [IU] via SUBCUTANEOUS
  Administered 2022-10-12 – 2022-10-13 (×5): 1 [IU] via SUBCUTANEOUS
  Filled 2022-09-29 (×65): qty 1

## 2022-09-29 MED ORDER — SODIUM CHLORIDE 0.9 % IV SOLN: INTRAVENOUS | Status: DC

## 2022-09-29 MED ORDER — SODIUM CHLORIDE 0.9 % IV SOLN
250.0000 mL | INTRAVENOUS | Status: DC
  Administered 2022-10-03 – 2022-10-07 (×3): 250 mL via INTRAVENOUS

## 2022-09-29 MED ORDER — PIPERACILLIN-TAZOBACTAM 3.375 G IVPB
3.3750 g | Freq: Three times a day (TID) | INTRAVENOUS | Status: DC
  Administered 2022-09-30 – 2022-10-06 (×21): 3.375 g via INTRAVENOUS
  Filled 2022-09-29 (×20): qty 50

## 2022-09-29 MED ORDER — PHENYLEPHRINE HCL-NACL 20-0.9 MG/250ML-% IV SOLN
25.0000 ug/min | INTRAVENOUS | Status: DC
  Administered 2022-09-29: 25 ug/min via INTRAVENOUS
  Administered 2022-09-30: 65 ug/min via INTRAVENOUS
  Administered 2022-09-30: 30 ug/min via INTRAVENOUS
  Administered 2022-09-30: 80 ug/min via INTRAVENOUS
  Administered 2022-10-01: 40 ug/min via INTRAVENOUS
  Administered 2022-10-01 (×3): 80 ug/min via INTRAVENOUS
  Administered 2022-10-01: 50 ug/min via INTRAVENOUS
  Filled 2022-09-29 (×9): qty 250

## 2022-09-29 MED ORDER — IOHEXOL 9 MG/ML PO SOLN
500.0000 mL | Freq: Once | ORAL | Status: AC | PRN
  Administered 2022-09-29 – 2022-10-05 (×2): 500 mL via ORAL

## 2022-09-29 MED ORDER — POLYETHYLENE GLYCOL 3350 17 G PO PACK
17.0000 g | PACK | Freq: Every day | ORAL | Status: DC | PRN
  Administered 2022-10-06 – 2022-10-07 (×2): 17 g via ORAL
  Filled 2022-09-29 (×3): qty 1

## 2022-09-29 MED ORDER — LIDOCAINE HCL (PF) 1 % IJ SOLN
10.0000 mL | Freq: Once | INTRAMUSCULAR | Status: AC
  Administered 2022-09-29: 10 mL via INTRADERMAL
  Filled 2022-09-29: qty 10

## 2022-09-29 MED ORDER — METOPROLOL TARTRATE 25 MG PO TABS: 12.5000 mg | ORAL_TABLET | Freq: Two times a day (BID) | ORAL | Status: DC

## 2022-09-29 MED ORDER — SODIUM CHLORIDE 0.9 % IV SOLN
3.0000 g | Freq: Three times a day (TID) | INTRAVENOUS | Status: DC
  Administered 2022-09-29: 3 g via INTRAVENOUS
  Filled 2022-09-29 (×2): qty 8

## 2022-09-29 NOTE — Progress Notes (Signed)
Referring Physician(s): Campbell Lerner, MD  Supervising Physician: Richarda Overlie  Patient Status:  [facility] - In-pt  Chief Complaint:  Suspected acute cholecystitis  Subjective:  Patient is ill appearing. He reports improvement of his RUQ pain to being minor, almost absent at times. He reports feeling somewhat more short of breath today.  Allergies: Patient has no known allergies.  Medications: Prior to Admission medications   Medication Sig Start Date End Date Taking? Authorizing Provider  amLODipine (NORVASC) 10 MG tablet Take 10 mg by mouth daily. 03/20/21  Yes [provider]  atorvastatin (LIPITOR) 40 MG tablet Take 40 mg by mouth every morning. 03/20/21  Yes [provider]  ELIQUIS 5 MG TABS tablet Take 5 mg by mouth 2 (two) times daily. 03/20/21  Yes [provider]  insulin glargine (LANTUS) 100 UNIT/ML injection Inject 18 Units into the skin daily.   Yes [provider]  lisinopril (ZESTRIL) 5 MG tablet Take 5 mg by mouth every morning. 03/02/21  Yes [provider]  oxyCODONE (OXY IR/ROXICODONE) 5 MG immediate release tablet Take 5 mg by mouth every 6 (six) hours as needed for severe pain.   Yes [provider]  blood glucose meter kit and supplies KIT Dispense based on patient and insurance preference. Use up to four times daily as directed. 03/27/22   Sherryll Burger, Pratik D, DO  Insulin Pen Needle (PEN NEEDLES 3/16") 31G X 5 MM MISC Take as directed with insulin pen. 03/27/22   Sherryll Burger, Pratik D, DO     Vital Signs: BP 103/76   Pulse (!) 120   Temp 98 F (36.7 C)   Resp (!) 23   Ht 6' (1.829 m)   Wt 163 lb (73.9 kg)   SpO2 (!) 88%   BMI 22.11 kg/m   Physical Exam Vitals reviewed.  Constitutional:      General: He is not in acute distress.    Appearance: He is ill-appearing.  Cardiovascular:     Rate and Rhythm: Tachycardia present. Rhythm irregular.     Heart sounds: Normal heart sounds.  Pulmonary:     Comments:  Diminished breath sounds bilaterally. Patient on 7 L O2 via nasal cannula Skin:    General: Skin is warm and dry.  Neurological:     Mental Status: He is alert and oriented to person, place, and time.     Imaging: ECHOCARDIOGRAM COMPLETE  Result Date: 09/29/2022    ECHOCARDIOGRAM REPORT   Patient Name:   Cameron Gutierrez Date of Exam: 09/29/2022 Medical Rec #:  [id number]       Height:       72.0 in Accession #:    [id number]      Weight:       163.0 lb Date of Birth:  February 19, 1946       BSA:          1.953 m Patient Age:    76 years        BP:           112/74 mmHg Patient Gender: M               HR:           115 bpm. Exam Location:  [facility] Procedure: 2D Echo, Cardiac Doppler and Color Doppler Indications:     Atrial Fibrillation I48.91  History:         Patient has no prior history of Echocardiogram examinations.  Arrythmias:Atrial Fibrillation; Risk Factors:Diabetes and                  Hypertension.  Sonographer:     Cristela Blue Referring Phys:  [id number] SONA PATEL Diagnosing Phys: Yvonne Kendall MD IMPRESSIONS  1. Left ventricular ejection fraction, by estimation, is 60 to 65%. The left ventricle has normal function. The left ventricle has no regional wall motion abnormalities. There is mild asymmetric left ventricular hypertrophy of the basal-septal segment. Left ventricular diastolic function could not be evaluated.  2. Pulmonary artery pressure is mildly elevated (RVSP 30-35 mmHg plus central venous/right atrial pressure). Right ventricular systolic function is normal. The right ventricular size is normal.  3. Moderate pleural effusion in the right lateral region.  4. The mitral valve is normal in structure. Mild to moderate mitral valve regurgitation.  5. Tricuspid valve regurgitation is mild to moderate.  6. The aortic valve is tricuspid. Aortic valve regurgitation is mild. No aortic stenosis is present. FINDINGS  Left Ventricle: Left ventricular ejection fraction, by estimation, is 60 to  65%. The left ventricle has normal function. The left ventricle has no regional wall motion abnormalities. The left ventricular internal cavity size was normal in size. There is  mild asymmetric left ventricular hypertrophy of the basal-septal segment. Left ventricular diastolic function could not be evaluated due to atrial fibrillation. Left ventricular diastolic function could not be evaluated. Right Ventricle: Pulmonary artery pressure is mildly elevated (RVSP 30-35 mmHg plus central venous/right atrial pressure). The right ventricular size is normal. No increase in right ventricular wall thickness. Right ventricular systolic function is normal. Left Atrium: Left atrial size was normal in size. Right Atrium: Right atrial size was normal in size. Pericardium: The pericardium was not well visualized. Mitral Valve: The mitral valve is normal in structure. Mild to moderate mitral valve regurgitation. Tricuspid Valve: The tricuspid valve is normal in structure. Tricuspid valve regurgitation is mild to moderate. Aortic Valve: The aortic valve is tricuspid. Aortic valve regurgitation is mild. No aortic stenosis is present. Aortic valve mean gradient measures 3.0 mmHg. Aortic valve peak gradient measures 4.7 mmHg. Aortic valve area, by VTI measures 3.27 cm. Pulmonic Valve: The pulmonic valve was grossly normal. Pulmonic valve regurgitation is trivial. No evidence of pulmonic stenosis. Aorta: The aortic root is normal in size and structure. Pulmonary Artery: The pulmonary artery is not well seen. Venous: The inferior vena cava was not well visualized. IAS/Shunts: The interatrial septum was not well visualized. Additional Comments: There is a moderate pleural effusion in the right lateral region.  LEFT VENTRICLE PLAX 2D LVIDd:         3.90 cm LVIDs:         2.60 cm LV PW:         0.90 cm LV IVS:        1.20 cm LVOT diam:     2.20 cm LV SV:         47 LV SV Index:   24 LVOT Area:     3.80 cm  RIGHT VENTRICLE RV Basal  diam:  3.50 cm RV Mid diam:    2.80 cm LEFT ATRIUM             Index        RIGHT ATRIUM           Index LA diam:        4.30 cm 2.20 cm/m   RA Area:     16.20 cm LA Vol (A2C):  70.1 ml 35.89 ml/m  RA Volume:   34.00 ml  17.41 ml/m LA Vol (A4C):   47.7 ml 24.42 ml/m LA Biplane Vol: 60.7 ml 31.08 ml/m  AORTIC VALVE AV Area (Vmax):    2.81 cm AV Area (Vmean):   2.73 cm AV Area (VTI):     3.27 cm AV Vmax:           108.00 cm/s AV Vmean:          74.350 cm/s AV VTI:            0.143 m AV Peak Grad:      4.7 mmHg AV Mean Grad:      3.0 mmHg LVOT Vmax:         79.80 cm/s LVOT Vmean:        53.400 cm/s LVOT VTI:          0.123 m LVOT/AV VTI ratio: 0.86  AORTA Ao Root diam: 3.66 cm MITRAL VALVE               TRICUSPID VALVE MV Area (PHT): 3.63 cm    TR Peak grad:   32.9 mmHg MV Decel Time: 209 msec    TR Vmax:        287.00 cm/s MV E velocity: 86.50 cm/s                            SHUNTS                            Systemic VTI:  0.12 m                            Systemic Diam: 2.20 cm Yvonne Kendall MD Electronically signed by Yvonne Kendall MD Signature Date/Time: 09/29/2022/2:31:41 PM    Final    US RENAL  Result Date: 09/29/2022 CLINICAL DATA:  Acute renal failure. EXAM: RENAL / URINARY TRACT ULTRASOUND COMPLETE COMPARISON:  September 26, 2022. FINDINGS: Right Kidney: Renal measurements: 9.6 x 5.1 x 4.7 cm = volume: 122 mL. At least 2 parapelvic cysts are noted, the largest measuring 2.3 cm. Increased echogenicity of renal parenchyma is noted. No mass or hydronephrosis visualized. Left Kidney: Renal measurements: 10.1 x 5.9 x 5.5 cm = volume: 170 mL. Multi septated cyst measuring 2.3 x 2.3 x 1.9 cm is noted. 2.5 cm simple cyst is noted. Increased echogenicity of renal parenchyma is noted. No hydronephrosis visualized. Bladder: Appears normal for degree of bladder distention. Other: None. IMPRESSION: 2.3 cm multi-septated complex cyst seen in left kidney. Follow-up ultrasound in 6-12 months is recommended to  ensure stability. Increased echogenicity of renal parenchyma is noted bilaterally suggesting medical renal disease. No hydronephrosis or renal obstruction is noted. Electronically Signed   By: Lupita Raider M.D.   On: 09/29/2022 13:28   DG Chest Port 1 View  Result Date: 09/28/2022 CLINICAL DATA:  Shortness of breath. EXAM: PORTABLE CHEST 1 VIEW COMPARISON:  September 26, 2022 FINDINGS: Calcific atherosclerotic disease of the aorta. Cardiomediastinal silhouette is enlarged. Mediastinal contours appear intact. Bilateral pleural effusions, right greater than left. No focal airspace consolidation. Osseous structures are without acute abnormality. Soft tissues are grossly normal. IMPRESSION: 1. Bilateral pleural effusions, right greater than left. 2. Enlarged cardiomediastinal silhouette. Electronically Signed   By: Ted Mcalpine M.D.   On: 09/28/2022 15:37   US Abdomen Limited  RUQ (LIVER/GB)  Result Date: 09/26/2022 CLINICAL DATA:  Abdominal pain. History of metastatic pancreatic cancer. EXAM: ULTRASOUND ABDOMEN LIMITED RIGHT UPPER QUADRANT COMPARISON:  CT abdomen pelvis from same day. FINDINGS: Gallbladder: No gallstones. Sludge with mild gallbladder wall thickening and small volume pericholecystic fluid. Positive sonographic Murphy sign noted by sonographer. Common bile duct: Diameter: 4 mm, normal.  Common bile duct stent noted. Liver: Liver lesions seen on CT are not well identified by ultrasound. Within normal limits in parenchymal echogenicity. Portal vein is patent on color Doppler imaging with normal direction of blood flow towards the liver. Other: None. IMPRESSION: 1. Gallbladder sludge with mild gallbladder wall thickening and small volume pericholecystic fluid. Positive sonographic Murphy sign. Findings are concerning for acute acalculous cholecystitis. 2. Liver metastases seen on CT are not well identified by ultrasound. Electronically Signed   By: Obie Dredge M.D.   On: 09/26/2022 14:56    CT ABDOMEN PELVIS W CONTRAST  Result Date: 09/26/2022 CLINICAL DATA:  Central chest pain. Abdominal pain. History of pancreatic cancer. EXAM: CT ABDOMEN AND PELVIS WITH CONTRAST TECHNIQUE: Multidetector CT imaging of the abdomen and pelvis was performed using the standard protocol following bolus administration of intravenous contrast. RADIATION DOSE REDUCTION: This exam was performed according to the departmental dose-optimization program which includes automated exposure control, adjustment of the mA and/or kV according to patient size and/or use of iterative reconstruction technique. CONTRAST:  80mL OMNIPAQUE IOHEXOL 300 MG/ML  SOLN COMPARISON:  Chest radiograph 09/26/2022. [facility] CT[facility] from [facility] dated 09/21/2022 and 09/22/2022. Outside images are unavailable but reports are available. FINDINGS: Lower chest: Multiple small pulmonary nodules in the visualized lungs. Index pulmonary nodule is in the right lower lobe along the right major fissure on image 33/4 measuring up to 1.3 cm. There also may be a larger nodule in the left lower lobe measuring 1.4 cm image 15/4. Volume loss and compressive atelectasis in the left lower lobe. Compressive atelectasis in the right lower lobe. Bilateral small pleural effusions. Hepatobiliary: Multiple ill-defined hypoechoic liver lesions compatible with metastatic disease. Index lesion in the left hepatic lobe measuring 2.1 cm on image 23/2. There is gas and contrast within the gallbladder. The gallbladder is moderately distended and findings compatible with recent ERCP. Metallic biliary stent in the common bile duct. Narrowing in the biliary stent associated with the pancreatic mass. Small amount of pneumobilia. Small amount of fluid and stranding around the liver and gallbladder. Pancreas: Large poorly defined pancreatic head mass with multiple hypoechoic components. Difficult to accurately measure the mass itself but it roughly measures 4.0 x 4.9 x 5.1 cm. Dilatation of the  main pancreatic duct. The mass extends cephalad and appears to be involving the caudate lobe. There is a caudate lesion measuring up to 4.1 cm. In addition, there is hypoechoic implant or fluid collection cephalad to the caudate lobe involving the hepatic subcapsular space. This subcapsular collection or implant measures 7.8 x 4.6 x 5.6 cm. This was present on the previous exam based on the prior report. This caudate/subcapsular component causing mass effect on the adjacent IVC. Mild stranding and edema throughout the porta hepatis region. Spleen: Small splenic calcifications.  Spleen is normal for size. Adrenals/Urinary Tract: Normal adrenal glands. Bilateral cortical and parapelvic renal cysts. No suspicious renal lesions. No hydronephrosis. Bladder is decompressed. There is probably a small bladder diverticulum along the posterior aspect of the bladder. Cannot exclude mild bladder wall thickening. Asymmetric left perinephric stranding. Stomach/Bowel: Normal appearance of the stomach. No bowel dilatation. Colonic diverticula  involving the sigmoid colon. No evidence for acute bowel inflammation. Vascular/Lymphatic: Atherosclerotic disease involving the abdominal aorta without aneurysm. Atherosclerotic calcifications at the origin of the SMA. Atherosclerotic calcifications at the origin of the bilateral renal arteries. IVC is patent but there is compression in the suprarenal IVC due to the subcapsular fluid collection / mass. 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 Main portal vein is small but patent. Intrahepatic portal veins are patent. 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 Splenic vein is patent. Pathologic lymphadenopathy in the retrocaval space measuring up to 1.7 cm on image 39/2. Reproductive: Prostate contains calcifications, asymmetric towards the left. Other: Small amount of free fluid  in the abdomen and pelvis. Musculoskeletal: Evidence for an old right tenth rib fracture. Bilateral inguinal hernias containing fat. Small amount of fluid in the left inguinal hernia. No suspicious osseous lesion. IMPRESSION: 1. Large pancreatic head mass compatible with the patient's history of pancreatic cancer. The mass extends cephalad and involves the caudate lobe. There is also a subcapsular fluid collection or implant cephalad to the caudate lobe causing mass effect on the adjacent IVC. 2. Multiple liver lesions, abdominal lymphadenopathy and multiple pulmonary nodules. Findings compatible with metastatic disease. 3. Metallic biliary stent in the common bile duct and there is a small amount of pneumobilia. No significant intrahepatic biliary dilatation. In addition, there is moderate distention of the gallbladder containing iodinated contrast and small amount of gas. Findings compatible with recent ERCP. Gallbladder distension could be a source of abdominal pain. 4. Bilateral small pleural effusions with compressive atelectasis in the lower lobes. 5. Small amount of free fluid in the abdomen and pelvis. 6. Colonic diverticulosis without evidence for acute diverticulitis. 7. Aortic Atherosclerosis (ICD10-I70.0). 8. 9 mm splenic artery aneurysm. Electronically Signed   By: Richarda Overlie M.D.   On: 09/26/2022 09:50   DG Chest 2 View  Result Date: 09/26/2022 CLINICAL DATA:  77 year old male with history of central chest pain. EXAM: CHEST - 2 VIEW COMPARISON:  Chest x-ray 07/28/2014. FINDINGS: Lung volumes are low. No consolidative airspace disease. Trace bilateral pleural effusions with bibasilar opacities that are favored to reflect subsegmental atelectasis. No pneumothorax. No evidence of pulmonary edema. No definite suspicious appearing pulmonary nodules or masses are noted. Heart size is normal. Upper mediastinal contours are within normal limits. Atherosclerotic calcifications in the thoracic aorta.  IMPRESSION: 1. Low lung volumes with trace bilateral pleural effusions and bibasilar opacities favored to reflect areas of subsegmental atelectasis. 2. Aortic atherosclerosis. Electronically Signed   By: Trudie Reed M.D.   On: 09/26/2022 06:19    Labs:  CBC: Recent Labs    09/26/22 0538 09/27/22 1421 09/28/22 1607 09/29/22 0201  WBC 15.7* 21.3* 18.7* 20.7*  HGB 13.4 14.7 12.9* 12.8*  HCT 41.3 43.4 36.5* 37.3*  PLT 337 341 327 333    COAGS: Recent Labs    09/27/22 1421 09/28/22 0916 09/28/22 1607 09/29/22 0201 09/29/22 0949  INR 1.7* 1.7*  --   --  2.0*  APTT  --   --  39* 109* 100*    BMP: Recent Labs    03/27/22 0549 09/26/22 0655 09/28/22 1607 09/29/22 0949  NA 136 133* 134* 134*  K 3.5 3.4* 4.4 3.7  CL 104 98 96* 97*  CO2 22 25 23  20*  GLUCOSE 140* 148* 157* 176*  BUN 20 15 70* 85*  CALCIUM 8.7* 7.4*  7.3* 7.0*  CREATININE 1.16 0.97 2.90* 3.17*  GFRNONAA >60 >60 22* 20*    LIVER FUNCTION TESTS: Recent Labs    03/27/22 0549 09/26/22 0655 09/29/22 0949  BILITOT 0.8 1.1 1.6*  AST 19 32 222*  ALT 20 24 106*  ALKPHOS 67 113 192*  PROT 6.0* 5.4* 5.6*  ALBUMIN 3.3* 2.2* 2.0*    Assessment and Plan:  Right pleural effusion -therapeutic thoracentesis performed bedside today by Dr Lowella Dandy. 800 mL of dark yellow fluid removed today; no pneumothorax on post-procedure CXR  Acute cholecystitis -Originally approved on 7/15 for percutaneous cholecystostomy, patient went into A-fib with RVR with HR high 120s-170s upon presentation to short stay. Procedure was postponed due to cardiac instability at that time -Worsening INR today (1.7 -> 2.0) despite last dose of Eliquis on evening of 7/13. Dr Lowella Dandy discussed with Dr Allena Katz; Dr Allena Katz ordered fibrinogen and d-dimer labs -RUQ ultrasound imaging during thoracentesis revealed increased perihepatic ascites as well as a large amount of gas in the gallbladder likely related to biliary stent and pneumobilia -Percutaneous  cholecystostomy placement with increasing risk for bleeding given worsening INR and perihepatic ascites.  -Percutaneous cholecystostomy on hold at this time, recommend HIDA scan and repeat CT Abdomen/Pelvis with contrast -Please contact IR team with questions or concerns, IR team will continue to follow  Electronically Signed: Kennieth Francois, PA-C 09/29/2022, 4:13 PM   I spent a total of 25 Minutes at the the patient's bedside AND on the patient's hospital floor or unit, greater than 50% of which was counseling/coordinating care for suspected acute cholecystitis

## 2022-09-29 NOTE — Progress Notes (Signed)
ANTICOAGULATION CONSULT NOTE   Pharmacy Consult for IV heparin Indication: atrial fibrillation  No Known Allergies  Patient Measurements: Height: 6' (182.9 cm) Weight: 73.9 kg (163 lb) IBW/kg (Calculated) : 77.6 Heparin Dosing Weight: 73.9 kg  Vital Signs: Temp: 97.7 F (36.5 C) (07/16 0900) Temp Source: Axillary (07/16 0900) BP: 123/73 (07/16 1100) Pulse Rate: 132 (07/16 1100)  Labs: Recent Labs    09/27/22 1421 09/28/22 0916 09/28/22 1607 09/28/22 1834 09/29/22 0201 09/29/22 0949  HGB 14.7  --  12.9*  --  12.8*  --   HCT 43.4  --  36.5*  --  37.3*  --   PLT 341  --  327  --  333  --   APTT  --   --  39*  --  109* 100*  LABPROT 20.5* 20.0*  --   --   --  22.5*  INR 1.7* 1.7*  --   --   --  2.0*  HEPARINUNFRC  --   --  0.71*  --  0.71*  --   CREATININE  --   --  2.90*  --   --  3.17*  TROPONINIHS 11  --   --  23*  --   --     Estimated Creatinine Clearance: 21 mL/min (A) (by C-G formula based on SCr of 3.17 mg/dL (H)).   Medical History: Past Medical History:  Diagnosis Date   Atrial fibrillation (HCC)    Cancer (HCC)    pancreatic   Diabetes mellitus without complication (HCC)    Hypercholesteremia    Hypertension     Medications:  Eliquis 5 mg BID (last dose 09/24/22)  Assessment: 77 year old male with PMH atrial fibrillation on Eliquis prior to admission. Plan was for cholecystectomy drain placement, but was cancelled due to acute Afib. Last dose was > 2 days ago.   7/16 0201 aPTT 109 and heparin level 0.7.  7/16 0949 aPTT 100, therapeutic x 1  Goal of Therapy:  HL 0.3 - 0.7 once heparin level and aPTT correlate.  aPTT 66-102 seconds Monitor platelets by anticoagulation protocol: Yes   Plan:  Continue heparin infusion at 950 units/hr.  - Check confirmatory aPTT in 8 hours and HL daily - Will continue using aPTT levels to dose until both levels correlate, then will switch to HL dosing - CBC and heparin level daily while on heparin.    Bettey Costa, PharmD Clinical Pharmacist 09/29/2022 12:28 PM

## 2022-09-29 NOTE — Consult Note (Signed)
[facility]  CONSULT NOTE    Date: 09/29/2022                  Patient Name:  Cameron Gutierrez  MRN: [id number]  DOB: Jul 22, 1945  Age / Sex: 77 y.o., male         PCP: Dondra Spry, MD                 Service Requesting Consult: Dr. Allena Katz                 Reason for Consult: Acute Kidney Injury            History of Present Illness: [name] admitted to [facility]. Moved to ICU for a-fib with rapid ventricular rate. Nephrology consulted for acute kidney injury.   Patient was admitted to [facility] from 7/8 to 7/12 where he was diagnosed with metastatic pancreatic adenocarcinoma. ERCP and stent placement prior to discharge from [facility].   Patient admitted with abdominal pain. Had CT with IV contrast on admission, 7/13.   Yesterday, patient developed atrial fibrillation with RVR and was transferred to ICU. Placed on vasopressors.   Nephrology consulted for acute kidney injury   Medications: Outpatient medications: Medications Prior to Admission  Medication Sig Dispense Refill Last Dose   amLODipine (NORVASC) 10 MG tablet Take 10 mg by mouth daily.   09/25/2022   atorvastatin (LIPITOR) 40 MG tablet Take 40 mg by mouth every morning.   09/25/2022   ELIQUIS 5 MG TABS tablet Take 5 mg by mouth 2 (two) times daily.   09/24/2022 at 1800   insulin glargine (LANTUS) 100 UNIT/ML injection Inject 18 Units into the skin daily.   09/25/2022   lisinopril (ZESTRIL) 5 MG tablet Take 5 mg by mouth every morning.   09/25/2022   oxyCODONE (OXY IR/ROXICODONE) 5 MG immediate release tablet Take 5 mg by mouth every 6 (six) hours as needed for severe pain.   09/25/2022   blood glucose meter kit and supplies KIT Dispense based on patient and insurance preference. Use up to four times daily as directed. 1 each 0    Insulin Pen Needle (PEN NEEDLES 3/16") 31G X 5 MM MISC Take as directed with insulin pen. 90 each 2     Current medications: Current Facility-Administered Medications   Medication Dose Route Frequency Provider Last Rate Last Admin   0.9 %  sodium chloride infusion  250 mL Intravenous Continuous Ezequiel Essex, NP       amiodarone (NEXTERONE PREMIX) 360-4.14 MG/200ML-% (1.8 mg/mL) IV infusion  30 mg/hr Intravenous Continuous Enedina Finner, MD 16.67 mL/hr at 09/29/22 0501 30 mg/hr at 09/29/22 0501   Ampicillin-Sulbactam (UNASYN) 3 g in sodium chloride 0.9 % 100 mL IVPB  3 g Intravenous Q6H Lowella Bandy, RPH   Stopped at 09/29/22 3244   atorvastatin (LIPITOR) tablet 40 mg  40 mg Oral q morning Enedina Finner, MD   40 mg at 09/26/22 1457   Chlorhexidine Gluconate Cloth 2 % PADS 6 each  6 each Topical QHS Enedina Finner, MD   6 each at 09/28/22 1612   docusate sodium (COLACE) capsule 100 mg  100 mg Oral BID Enedina Finner, MD   100 mg at 09/26/22 2243   feeding supplement (ENSURE ENLIVE / ENSURE PLUS) liquid 237 mL  237 mL Oral TID BM Enedina Finner, MD   237 mL at 09/26/22 2247   fentaNYL (SUBLIMAZE) injection 50 mcg  50 mcg Intravenous Q4H  PRN Enedina Finner, MD       heparin ADULT infusion 100 units/mL (25000 units/263mL)  950 Units/hr Intravenous Continuous Ronnald Ramp, RPH 9.5 mL/hr at 09/29/22 0500 950 Units/hr at 09/29/22 0500   insulin aspart (novoLOG) injection 0-5 Units  0-5 Units Subcutaneous QHS Enedina Finner, MD       insulin aspart (novoLOG) injection 0-9 Units  0-9 Units Subcutaneous TID WC Enedina Finner, MD   2 Units at 09/29/22 0842   levalbuterol (XOPENEX) nebulizer solution 0.63 mg  0.63 mg Nebulization Q6H PRN Ezequiel Essex, NP       metoprolol tartrate (LOPRESSOR) injection 5 mg  5 mg Intravenous Q4H PRN Enedina Finner, MD   5 mg at 09/28/22 1456   morphine (PF) 2 MG/ML injection 2 mg  2 mg Intravenous Q2H PRN Enedina Finner, MD   2 mg at 09/28/22 1639   multivitamin with minerals tablet 1 tablet  1 tablet Oral Daily Enedina Finner, MD       ondansetron [facility]) tablet 4 mg  4 mg Oral Q6H PRN Enedina Finner, MD       Or   ondansetron [facility]) injection 4 mg  4 mg  Intravenous Q6H PRN Enedina Finner, MD   4 mg at 09/28/22 1638   oxyCODONE (Oxy IR/ROXICODONE) immediate release tablet 10 mg  10 mg Oral Q4H PRN Enedina Finner, MD   10 mg at 09/27/22 0417   phenylephrine (NEO-SYNEPHRINE) 20mg /NS premix infusion  25-200 mcg/min Intravenous Titrated Ezequiel Essex, NP 18.75 mL/hr at 09/29/22 0854 25 mcg/min at 09/29/22 0854   polyethylene glycol (MIRALAX / GLYCOLAX) packet 17 g  17 g Oral Daily PRN Enedina Finner, MD       potassium chloride SA (KLOR-CON M) CR tablet 40 mEq  40 mEq Oral Once [place]ock, Lavonna Rua, NP       promethazine (PHENERGAN) 12.5 mg in sodium chloride 0.9 % 50 mL IVPB  12.5 mg Intravenous Q6H PRN Enedina Finner, MD       traMADol Janean Sark) tablet 50 mg  50 mg Oral Q6H PRN Enedina Finner, MD   50 mg at 09/27/22 2252   traZODone (DESYREL) tablet 25 mg  25 mg Oral QHS PRN Enedina Finner, MD   25 mg at 09/27/22 2253      Allergies: No Known Allergies    Past Medical History: Past Medical History:  Diagnosis Date   Atrial fibrillation (HCC)    Cancer (HCC)    pancreatic   Diabetes mellitus without complication (HCC)    Hypercholesteremia    Hypertension      Past Surgical History: Past Surgical History:  Procedure Laterality Date   KNEE SURGERY     REPLACEMENT TOTAL KNEE Right    SHOULDER SURGERY       Family History: History reviewed. No pertinent family history.   Social History: Social History   Socioeconomic History   Marital status: Married    Spouse name: Not on file   Number of children: Not on file   Years of education: Not on file   Highest education level: Not on file  Occupational History   Not on file  Tobacco Use   Smoking status: Never   Smokeless tobacco: Never  Substance and Sexual Activity   Alcohol use: Not Currently   Drug use: Never   Sexual activity: Not on file  Other Topics Concern   Not on file  Social History Narrative   ** Merged History Encounter **  Social Determinants of Health    Financial Resource Strain: Low Risk  (09/21/2022)   Received from [facility]   Overall Financial Resource Strain (CARDIA)    Difficulty of Paying Living Expenses: Not very hard  Food Insecurity: No Food Insecurity (09/26/2022)   Hunger Vital Sign    Worried About Running Out of Food in the Last Year: Never true    Ran Out of Food in the Last Year: Never true  Transportation Needs: No Transportation Needs (09/26/2022)   PRAPARE - Administrator, Civil Service (Medical): No    Lack of Transportation (Non-Medical): No  Physical Activity: Not on file  Stress: No Stress Concern Present (09/21/2022)   Received from [facility] - Occupational Stress Questionnaire    Feeling of Stress : Not at all  Social Connections: Not on file  Intimate Partner Violence: Not At Risk (09/26/2022)   Humiliation, Afraid, Rape, and Kick questionnaire    Fear of Current or Ex-Partner: No    Emotionally Abused: No    Physically Abused: No    Sexually Abused: No       Vital Signs: Blood pressure 112/74, pulse (!) 115, temperature 98.1 F (36.7 C), temperature source Axillary, resp. rate (!) 26, height 6' (1.829 m), weight 73.9 kg, SpO2 90%.  Weight trends: Filed Weights   09/26/22 0519  Weight: 73.9 kg    Physical Exam: General: NAD, laying in bed, ill appearing  Head: Normocephalic, atraumatic. Moist oral mucosal membranes  Eyes: Anicteric, PERRL  Neck: Supple, trachea midline  Lungs:  Clear to auscultation  Heart: irregular  Abdomen:  Soft, nontender, no masses palpated  Extremities:  no peripheral edema.  Neurologic: Nonfocal, moving all four extremities  Skin: No lesions  Access: none     Lab results: Basic Metabolic Panel: Recent Labs  Lab 09/26/22 0655 09/28/22 1607  NA 133* 134*  K 3.4* 4.4  CL 98 96*  CO2 25 23  GLUCOSE 148* 157*  BUN 15 70*  CREATININE 0.97 2.90*  CALCIUM 7.4* 7.3*    Liver Function  Tests: Recent Labs  Lab 09/26/22 0655  AST 32  ALT 24  ALKPHOS 113  BILITOT 1.1  PROT 5.4*  ALBUMIN 2.2*   Recent Labs  Lab 09/26/22 0655  LIPASE 19   No results for input(s): "AMMONIA" in the last 168 hours.  CBC: Recent Labs  Lab 09/26/22 0538 09/27/22 1421 09/28/22 1607 09/29/22 0201  WBC 15.7* 21.3* 18.7* 20.7*  HGB 13.4 14.7 12.9* 12.8*  HCT 41.3 43.4 36.5* 37.3*  MCV 86.0 82.0 79.5* 81.3  PLT 337 341 327 333    Cardiac Enzymes: No results for input(s): "CKTOTAL", "CKMB", "CKMBINDEX", "TROPONINI" in the last 168 hours.  BNP: Invalid input(s): "POCBNP"  CBG: Recent Labs  Lab 09/28/22 1124 09/28/22 1444 09/28/22 1559 09/28/22 2126 09/29/22 0720  GLUCAP 117* 146* 156* 133* 170*    Microbiology: Results for orders placed or performed during the hospital encounter of 09/26/22  MRSA Next Gen by PCR, Nasal     Status: None   Collection Time: 09/28/22  4:12 PM   Specimen: Nasal Mucosa; Nasal Swab  Result Value Ref Range Status   MRSA by PCR Next Gen NOT DETECTED NOT DETECTED Final    Comment: (NOTE) The GeneXpert MRSA Assay (FDA approved for NASAL specimens only), is one component of a comprehensive MRSA colonization surveillance program. It is not intended to diagnose MRSA infection nor  to guide or monitor treatment for MRSA infections. Test performance is not FDA approved in patients less than 52 years old. Performed at [facility], [address] [place], [place] [address]     Coagulation Studies: Recent Labs    09/27/22 1421 09/28/22 0916  LABPROT 20.5* 20.0*  INR 1.7* 1.7*    Urinalysis: No results for input(s): "COLORURINE", "LABSPEC", "PHURINE", "GLUCOSEU", "HGBUR", "BILIRUBINUR", "KETONESUR", "PROTEINUR", "UROBILINOGEN", "NITRITE", "LEUKOCYTESUR" in the last 72 hours.  Invalid input(s): "APPERANCEUR"    Imaging: DG Chest Port 1 View  Result Date: 09/28/2022 CLINICAL DATA:  Shortness of breath. EXAM: PORTABLE CHEST 1  VIEW COMPARISON:  September 26, 2022 FINDINGS: Calcific atherosclerotic disease of the aorta. Cardiomediastinal silhouette is enlarged. Mediastinal contours appear intact. Bilateral pleural effusions, right greater than left. No focal airspace consolidation. Osseous structures are without acute abnormality. Soft tissues are grossly normal. IMPRESSION: 1. Bilateral pleural effusions, right greater than left. 2. Enlarged cardiomediastinal silhouette. Electronically Signed   By: Ted Mcalpine M.D.   On: 09/28/2022 15:37     Assessment & Plan: [name] is a 77 y.o.  male with atrial fibrillation, pancreatic cancer, diabetes mellitus type II, hyperlipidemia, hypertension, who was admitted to [facility] on 09/26/2022 for Generalized abdominal pain [R10.84] Adenocarcinoma of pancreas (HCC) [C25.9] Primary pancreatic cancer with metastasis to other site [facility]) [C25.9]  Nephrology consulted for acute kidney injury.   Acute Kidney Injury: baseline creatinine of 0.97 with GFR >60 on admission, 7/13. VI contrast exposure on 7/13. Suspect IV contrast induced nephropathy versus prerenal azotemia.  - check urine studies - check renal ultrasound - holding home lisinopril - IV fluids: start NS at 84mL/hr  Hypotension: placed on phenylephrine gtt.  - holding amlodipine and lisinopril.   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 history of glycosuria.  Hemoglobin A1c of 7% on admission.  - continue glucose control.      LOS: 2 Maor Meckel 7/16/20249:52 AM

## 2022-09-29 NOTE — TOC CM/SW Note (Signed)
Transition of Care [facility]) - Inpatient Brief Assessment   Patient Details  Name: Cameron Gutierrez MRN: [id number] Date of Birth: [birth year]  Transition of Care [facility] CM/SW Contact:    Kreg Shropshire, RN Phone Number: 09/29/2022, 9:23 AM   Clinical Narrative:  Cm assessed for TOC. Pt came from home with wife. Wife, son, and graddaughter at bedside. No DME, HH, nor SNF prior to hospital stay. Pt currently on oxygen and no previous oxygen at home. Cm will continue to follow for TOC needs at this time.  Transition of Care Asessment: Insurance and Status: Insurance coverage has been reviewed Patient has primary care physician: Yes Home environment has been reviewed: No DME, NO HH nor SNF prior, Lives with wife. Son, granddaughter, and wife at bedside Prior level of function:: indepdent came from home Prior/Current Home Services: No current home services Social Determinants of Health Reivew: SDOH reviewed no interventions necessary Readmission risk has been reviewed: Yes Transition of care needs: no transition of care needs at this time

## 2022-09-29 NOTE — Progress Notes (Signed)
Progress Note  Patient Name: Cameron Gutierrez Date of Encounter: 09/29/2022  Primary Cardiologist: New - consult by Kirke Corin  Subjective   No chest pain, dyspnea, dizziness, presyncope, syncope, or palpitations.  Reports he is intermittently in A-fib.  Remains in A-fib with overall improvement in ventricular rates in the 90s to low 100s bpm on amiodarone drip.  Remains on Neo-Synephrine.  Renal function pending this morning.  Inpatient Medications    Scheduled Meds:  atorvastatin  40 mg Oral q morning   Chlorhexidine Gluconate Cloth  6 each Topical QHS   docusate sodium  100 mg Oral BID   feeding supplement  237 mL Oral TID BM   insulin aspart  0-5 Units Subcutaneous QHS   insulin aspart  0-9 Units Subcutaneous TID WC   multivitamin with minerals  1 tablet Oral Daily   polyethylene glycol  17 g Oral BID   potassium chloride  40 mEq Oral Once   Continuous Infusions:  sodium chloride     amiodarone 30 mg/hr (09/29/22 0501)   ampicillin-sulbactam (UNASYN) IV Stopped (09/29/22 0333)   heparin 950 Units/hr (09/29/22 0500)   phenylephrine (NEO-SYNEPHRINE) Adult infusion     promethazine (PHENERGAN) injection (IM or IVPB)     PRN Meds: fentaNYL (SUBLIMAZE) injection, levalbuterol, metoprolol tartrate, morphine injection, ondansetron **OR** ondansetron (ZOFRAN) IV, oxyCODONE, promethazine (PHENERGAN) injection (IM or IVPB), traMADol, traZODone   Vital Signs    Vitals:   09/29/22 0615 09/29/22 0630 09/29/22 0645 09/29/22 0700  BP: 97/63 96/75 101/67 112/74  Pulse: (!) 109 (!) 109 99 (!) 115  Resp: 16 17 18  (!) 26  Temp:      TempSrc:      SpO2: 91% 91% 90% 90%  Weight:      Height:        Intake/Output Summary (Last 24 hours) at 09/29/2022 0818 Last data filed at 09/29/2022 0555 Gross per 24 hour  Intake 3235.36 ml  Output 215 ml  Net 3020.36 ml   Filed Weights   09/26/22 0519  Weight: 73.9 kg    Telemetry    A-fib with ventricular rates in the 90s to low 100s bpm,  overall improvement in ventricular rates - Personally Reviewed  ECG    No new tracings - Personally Reviewed  Physical Exam   GEN: No acute distress.   Neck: No JVD. Cardiac: IRIR, no murmurs, rubs, or gallops.  Respiratory: Clear to auscultation bilaterally.  GI: Soft, nontender, non-distended.   MS: No edema; No deformity. Neuro:  Alert and oriented x 3; Nonfocal.  Psych: Normal affect.  Labs    Chemistry Recent Labs  Lab 09/26/22 0655 09/28/22 1607  NA 133* 134*  K 3.4* 4.4  CL 98 96*  CO2 25 23  GLUCOSE 148* 157*  BUN 15 70*  CREATININE 0.97 2.90*  CALCIUM 7.4* 7.3*  PROT 5.4*  --   ALBUMIN 2.2*  --   AST 32  --   ALT 24  --   ALKPHOS 113  --   BILITOT 1.1  --   GFRNONAA >60 22*  ANIONGAP 10 15     Hematology Recent Labs  Lab 09/27/22 1421 09/28/22 1607 09/29/22 0201  WBC 21.3* 18.7* 20.7*  RBC 5.29 4.59 4.59  HGB 14.7 12.9* 12.8*  HCT 43.4 36.5* 37.3*  MCV 82.0 79.5* 81.3  MCH 27.8 28.1 27.9  MCHC 33.9 35.3 34.3  RDW 13.6 13.9 13.7  PLT 341 327 333    Cardiac EnzymesNo results for input(s): "  TROPONINI" in the last 168 hours. No results for input(s): "TROPIPOC" in the last 168 hours.   BNP Recent Labs  Lab 09/28/22 1827  BNP 222.2*     DDimer No results for input(s): "DDIMER" in the last 168 hours.   Radiology    DG Chest Port 1 View  Result Date: 09/28/2022 IMPRESSION: 1. Bilateral pleural effusions, right greater than left. 2. Enlarged cardiomediastinal silhouette. Electronically Signed   By: Ted Mcalpine M.D.   On: 09/28/2022 15:37    Cardiac Studies   2D echo pending  Patient Profile     77 y.o. male with history of adult onset type 1 diabetes, hypertension, CKD stage II, hyperlipidemia, atrial fibrillation of uncertain chronicity on chronic anticoagulation with apixaban, recently diagnosed metastatic pancreatic adenocarcinoma, who is being seen 09/29/2022 for the evaluation of atrial fibrillation RVR at the request of  Dr. Allena Katz.   Assessment & Plan    Atrial fibrillation RVR of uncertain chronicity -Presented to the [facility] emergency department 7/13 with EKG revealing rate controlled atrial fibrillation -During procedure on 7/15, he went into A-fib RVR with rates of 116 bpm, procedure had to be canceled -Likely cause multifactorial from sepsis, cholecystitis, metastatic adenocarcinoma of the pancreas, hypertension -Echo pending with further recommendations to follow -Heparin infusion in place of apixaban, with plans to be transitioned back to [facility] prior to discharge for CHA2DS2-VASc score of at least 4 for stroke prophylaxis -Relative hypotension requiring vasopressor support and AKI has limited rate controlling options -While not ideal given interruption in anticoagulation, he has been maintained on amiodarone infusion for rate control, no other options at this time -Continue with cardiac monitoring   Mild hypokalemia -Status post repletion -Labs pending this morning -Maintain potassium goal 4.0   Diffuse abdominal pain status post recent ERCP with stent placement and acalculus cholecystitis -Came in with excruciating abdominal pain -Abdominal CT scan was abnormal, he was tachycardic, tachypneic, elevated white count -Surgical consultation recommended gallbladder drain to be placed by IR -This was placed on hold for procedure -Empiric antibiotics were started -Ultrasound the abdomen revealed gallbladder sludge with mild gallbladder wall thickening and small volume pericholecystic fluid -LFTs remain stable -Continued management by IM and general surgery  Primary metastatic adenocarcinoma of the pancreas -Workup completed at [facility] -Oncology consult -Continued management per IM and oncology   Hypotension with a history of hypertension -Remains on Neo-Synephrine, wean as able -Continue to hold PTA antihypertensives   Type 1 diabetes adult onset -Continued on insulin -Follows with [facility]  endocrinology -Continued management per IM   Hyperlipidemia -Continued on statin therapy   Poor p.o. intake/debility -Continue with therapy and dietary support       For questions or updates, please contact [facility] Please consult [email or web address] for contact info under Cardiology/STEMI.    Signed, Eula Listen, PA-C [facility] Pager: [phone] 09/29/2022, 8:18 AM

## 2022-09-29 NOTE — Plan of Care (Signed)
Plan of care reviewed with family. Pt Contimed on Amio gtt, heparin gtt, neo gtt. Started on Ns at 28mls/hr. Pt resp condition improved from yesterday. Pt had 300 mls of urine in last 12 hrs. From his foley. Prn fentanyl and oxy po given x1

## 2022-09-29 NOTE — Progress Notes (Signed)
*  PRELIMINARY RESULTS* Echocardiogram 2D Echocardiogram has been performed.  Cameron Gutierrez 09/29/2022, 11:09 AM

## 2022-09-29 NOTE — Progress Notes (Signed)
ANTICOAGULATION CONSULT NOTE   Pharmacy Consult for IV heparin Indication: atrial fibrillation  No Known Allergies  Patient Measurements: Height: 6' (182.9 cm) Weight: 73.9 kg (163 lb) IBW/kg (Calculated) : 77.6 Heparin Dosing Weight: 73.9 kg  Vital Signs: Temp: 98.6 F (37 C) (07/16 2013) Temp Source: Oral (07/16 2013) BP: 108/83 (07/16 2200) Pulse Rate: 105 (07/16 2100)  Labs: Recent Labs    09/27/22 1421 09/27/22 1421 09/28/22 0916 09/28/22 1607 09/28/22 1834 09/29/22 0201 09/29/22 0949 09/29/22 2246  HGB 14.7  --   --  12.9*  --  12.8*  --   --   HCT 43.4  --   --  36.5*  --  37.3*  --   --   PLT 341  --   --  327  --  333  --   --   APTT  --    < >  --  39*  --  109* 100* 128*  LABPROT 20.5*  --  20.0*  --   --   --  22.5*  --   INR 1.7*  --  1.7*  --   --   --  2.0*  --   HEPARINUNFRC  --   --   --  0.71*  --  0.71*  --   --   CREATININE  --   --   --  2.90*  --   --  3.17*  --   TROPONINIHS 11  --   --   --  23*  --   --   --    < > = values in this interval not displayed.    Estimated Creatinine Clearance: 21 mL/min (A) (by C-G formula based on SCr of 3.17 mg/dL (H)).   Medical History: Past Medical History:  Diagnosis Date   Atrial fibrillation (HCC)    Cancer (HCC)    pancreatic   Diabetes mellitus without complication (HCC)    Hypercholesteremia    Hypertension     Medications:  Eliquis 5 mg BID (last dose 09/24/22)  Assessment: 77 year old male with PMH atrial fibrillation on Eliquis prior to admission. Plan was for cholecystectomy drain placement, but was cancelled due to acute Afib. Last dose was > 2 days ago.   7/16 0201 aPTT 109 and heparin level 0.7.  7/16 0949 aPTT 100, therapeutic x 1 7/16 2246 aPTT 128   Goal of Therapy:  HL 0.3 - 0.7 once heparin level and aPTT correlate.  aPTT 66-102 seconds Monitor platelets by anticoagulation protocol: Yes   Plan:  aPTT is supratherapeutic. Will decrease infusion to 850 units/hr. Recheck  aPTT in 8 hours. Heparin level and CBC with AM labs. Once heparin level and aPTT correlate, switch to heparin level monitoring.   Ronnald Ramp, PharmD Clinical Pharmacist 09/29/2022 11:29 PM

## 2022-09-29 NOTE — Consult Note (Signed)
[facility] at [facility] [phone]) [phone] [phone]) [phone]   Name: Cameron Gutierrez Date: 09/29/2022 MRN: [id number]  DOB: [birth year]  Patient Care Team: Dondra Spry, MD as PCP - General (Internal Medicine) Clinic, General Medical (Family Medicine)    REASON FOR CONSULTATION: Cameron Gutierrez is a 77 y.o. male with multiple medical problems including diabetes, hypertension, CKD stage II, A-fib on Eliquis, who was recently hospitalized at [facility] 09/21/2022 to 09/25/2022 for abdominal pain and weight loss with workup consistent with metastatic adenocarcinoma of the pancreas.  Patient readmitted here on 09/26/2022 with intractable abdominal pain.  Was found to have acalculus cholecystitis.  Hospitalization has been complicated by A-fib.  Palliative care was consulted to address goals and manage ongoing symptoms.  SOCIAL HISTORY:     reports that he has never smoked. He has never used smokeless tobacco. He reports that he does not currently use alcohol. He reports that he does not use drugs.  Patient is married and lives at home with his wife in [place].  His granddaughter lives next-door.  Patient has two sons who live nearby and another son in [place].  Patient worked at [employer].  ADVANCE DIRECTIVES:  Does not have  CODE STATUS: Full code  PAST MEDICAL HISTORY: Past Medical History:  Diagnosis Date   Atrial fibrillation (HCC)    Cancer (HCC)    pancreatic   Diabetes mellitus without complication (HCC)    Hypercholesteremia    Hypertension     PAST SURGICAL HISTORY:  Past Surgical History:  Procedure Laterality Date   KNEE SURGERY     REPLACEMENT TOTAL KNEE Right    SHOULDER SURGERY      HEMATOLOGY/ONCOLOGY HISTORY:  Oncology History   No history exists.    ALLERGIES:  has No Known Allergies.  MEDICATIONS:  Current Facility-Administered Medications  Medication Dose Route Frequency Provider  Last Rate Last Admin   0.9 %  sodium chloride infusion  250 mL Intravenous Continuous Ezequiel Essex, NP   Held at 09/29/22 1140   0.9 %  sodium chloride infusion   Intravenous Continuous Kolluru, Sarath, MD 50 mL/hr at 09/29/22 1140 Rate Change at 09/29/22 1140   amiodarone (NEXTERONE PREMIX) 360-4.14 MG/200ML-% (1.8 mg/mL) IV infusion  30 mg/hr Intravenous Continuous Enedina Finner, MD 16.67 mL/hr at 09/29/22 1114 30 mg/hr at 09/29/22 1114   Ampicillin-Sulbactam (UNASYN) 3 g in sodium chloride 0.9 % 100 mL IVPB  3 g Intravenous Q6H Lowella Bandy, RPH   Stopped at 09/29/22 1031   atorvastatin (LIPITOR) tablet 40 mg  40 mg Oral q morning Enedina Finner, MD   40 mg at 09/26/22 1457   Chlorhexidine Gluconate Cloth 2 % PADS 6 each  6 each Topical QHS Enedina Finner, MD   6 each at 09/28/22 1612   docusate sodium (COLACE) capsule 100 mg  100 mg Oral BID Enedina Finner, MD   100 mg at 09/26/22 2243   feeding supplement (ENSURE ENLIVE / ENSURE PLUS) liquid 237 mL  237 mL Oral TID BM Enedina Finner, MD   237 mL at 09/26/22 2247   fentaNYL (SUBLIMAZE) injection 50 mcg  50 mcg Intravenous Q4H PRN Enedina Finner, MD       heparin ADULT infusion 100 units/mL (25000 units/231mL)  950 Units/hr Intravenous Continuous Ronnald Ramp, RPH 9.5 mL/hr at 09/29/22 1114 950 Units/hr at 09/29/22 1114   insulin aspart (novoLOG) injection 0-5 Units  0-5 Units Subcutaneous QHS  Enedina Finner, MD       insulin aspart (novoLOG) injection 0-9 Units  0-9 Units Subcutaneous TID WC Enedina Finner, MD   2 Units at 09/29/22 0842   levalbuterol (XOPENEX) nebulizer solution 0.63 mg  0.63 mg Nebulization Q6H PRN Ezequiel Essex, NP       metoprolol tartrate (LOPRESSOR) injection 5 mg  5 mg Intravenous Q4H PRN Enedina Finner, MD   5 mg at 09/28/22 1456   morphine (PF) 2 MG/ML injection 2 mg  2 mg Intravenous Q2H PRN Enedina Finner, MD   2 mg at 09/28/22 1639   multivitamin with minerals tablet 1 tablet  1 tablet Oral Daily Enedina Finner, MD       ondansetron  [facility]) tablet 4 mg  4 mg Oral Q6H PRN Enedina Finner, MD       Or   ondansetron [facility]) injection 4 mg  4 mg Intravenous Q6H PRN Enedina Finner, MD   4 mg at 09/28/22 1638   oxyCODONE (Oxy IR/ROXICODONE) immediate release tablet 10 mg  10 mg Oral Q4H PRN Enedina Finner, MD   10 mg at 09/27/22 0417   phenylephrine (NEO-SYNEPHRINE) 20mg /NS premix infusion  25-200 mcg/min Intravenous Titrated Ezequiel Essex, NP 18.75 mL/hr at 09/29/22 1114 25 mcg/min at 09/29/22 1114   polyethylene glycol (MIRALAX / GLYCOLAX) packet 17 g  17 g Oral Daily PRN Enedina Finner, MD       potassium chloride SA (KLOR-CON M) CR tablet 40 mEq  40 mEq Oral Once Hammock, Lavonna Rua, NP       promethazine (PHENERGAN) 12.5 mg in sodium chloride 0.9 % 50 mL IVPB  12.5 mg Intravenous Q6H PRN Enedina Finner, MD       traMADol Janean Sark) tablet 50 mg  50 mg Oral Q6H PRN Enedina Finner, MD   50 mg at 09/27/22 2252   traZODone (DESYREL) tablet 25 mg  25 mg Oral QHS PRN Enedina Finner, MD   25 mg at 09/27/22 2253    VITAL SIGNS: BP 123/73   Pulse (!) 132   Temp 97.7 F (36.5 C) (Axillary)   Resp (!) 26   Ht 6' (1.829 m)   Wt 163 lb (73.9 kg)   SpO2 (!) 88%   BMI 22.11 kg/m  Filed Weights   09/26/22 0519  Weight: 163 lb (73.9 kg)    Estimated body mass index is 22.11 kg/m as calculated from the following:   Height as of this encounter: 6' (1.829 m).   Weight as of this encounter: 163 lb (73.9 kg).  LABS: CBC:    Component Value Date/Time   WBC 20.7 (H) 09/29/2022 0201   HGB 12.8 (L) 09/29/2022 0201   HGB 14.6 09/24/2013 0122   HCT 37.3 (L) 09/29/2022 0201   HCT 43.7 09/24/2013 0122   PLT 333 09/29/2022 0201   PLT 170 09/24/2013 0122   MCV 81.3 09/29/2022 0201   MCV 87 09/24/2013 0122   NEUTROABS 5.5 09/24/2013 0122   LYMPHSABS 1.4 09/24/2013 0122   MONOABS 0.7 09/24/2013 0122   EOSABS 0.3 09/24/2013 0122   BASOSABS 0.0 09/24/2013 0122   Comprehensive Metabolic Panel:    Component Value Date/Time   NA 134 (L) 09/29/2022  0949   NA 141 04/09/2021 1427   NA 141 09/24/2013 0122   K 3.7 09/29/2022 0949   K 3.8 09/24/2013 0122   CL 97 (L) 09/29/2022 0949   CL 105 09/24/2013 0122   CO2 20 (L) 09/29/2022 0949   CO2  28 09/24/2013 0122   BUN 85 (H) 09/29/2022 0949   BUN 24 04/09/2021 1427   BUN 29 (H) 09/24/2013 0122   CREATININE 3.17 (H) 09/29/2022 0949   CREATININE 1.50 (H) 09/24/2013 0122   GLUCOSE 176 (H) 09/29/2022 0949   GLUCOSE 127 (H) 09/24/2013 0122   CALCIUM 7.0 (L) 09/29/2022 0949   CALCIUM 8.8 09/24/2013 0122   AST 222 (H) 09/29/2022 0949   ALT 106 (H) 09/29/2022 0949   ALKPHOS 192 (H) 09/29/2022 0949   BILITOT 1.6 (H) 09/29/2022 0949   PROT 5.6 (L) 09/29/2022 0949   ALBUMIN 2.0 (L) 09/29/2022 0949    RADIOGRAPHIC STUDIES: DG Chest Port 1 View  Result Date: 09/28/2022 CLINICAL DATA:  Shortness of breath. EXAM: PORTABLE CHEST 1 VIEW COMPARISON:  September 26, 2022 FINDINGS: Calcific atherosclerotic disease of the aorta. Cardiomediastinal silhouette is enlarged. Mediastinal contours appear intact. Bilateral pleural effusions, right greater than left. No focal airspace consolidation. Osseous structures are without acute abnormality. Soft tissues are grossly normal. IMPRESSION: 1. Bilateral pleural effusions, right greater than left. 2. Enlarged cardiomediastinal silhouette. Electronically Signed   By: Ted Mcalpine M.D.   On: 09/28/2022 15:37   US Abdomen Limited RUQ (LIVER/GB)  Result Date: 09/26/2022 CLINICAL DATA:  Abdominal pain. History of metastatic pancreatic cancer. EXAM: ULTRASOUND ABDOMEN LIMITED RIGHT UPPER QUADRANT COMPARISON:  CT abdomen pelvis from same day. FINDINGS: Gallbladder: No gallstones. Sludge with mild gallbladder wall thickening and small volume pericholecystic fluid. Positive sonographic Murphy sign noted by sonographer. Common bile duct: Diameter: 4 mm, normal.  Common bile duct stent noted. Liver: Liver lesions seen on CT are not well identified by ultrasound. Within  normal limits in parenchymal echogenicity. Portal vein is patent on color Doppler imaging with normal direction of blood flow towards the liver. Other: None. IMPRESSION: 1. Gallbladder sludge with mild gallbladder wall thickening and small volume pericholecystic fluid. Positive sonographic Murphy sign. Findings are concerning for acute acalculous cholecystitis. 2. Liver metastases seen on CT are not well identified by ultrasound. Electronically Signed   By: Obie Dredge M.D.   On: 09/26/2022 14:56   CT ABDOMEN PELVIS W CONTRAST  Result Date: 09/26/2022 CLINICAL DATA:  Central chest pain. Abdominal pain. History of pancreatic cancer. EXAM: CT ABDOMEN AND PELVIS WITH CONTRAST TECHNIQUE: Multidetector CT imaging of the abdomen and pelvis was performed using the standard protocol following bolus administration of intravenous contrast. RADIATION DOSE REDUCTION: This exam was performed according to the departmental dose-optimization program which includes automated exposure control, adjustment of the mA and/or kV according to patient size and/or use of iterative reconstruction technique. CONTRAST:  80mL OMNIPAQUE IOHEXOL 300 MG/ML  SOLN COMPARISON:  Chest radiograph 09/26/2022. Outside CTs from [facility] dated 09/21/2022 and 09/22/2022. Outside images are unavailable but reports are available. FINDINGS: Lower chest: Multiple small pulmonary nodules in the visualized lungs. Index pulmonary nodule is in the right lower lobe along the right major fissure on image 33/4 measuring up to 1.3 cm. There also may be a larger nodule in the left lower lobe measuring 1.4 cm image 15/4. Volume loss and compressive atelectasis in the left lower lobe. Compressive atelectasis in the right lower lobe. Bilateral small pleural effusions. Hepatobiliary: Multiple ill-defined hypoechoic liver lesions compatible with metastatic disease. Index lesion in the left hepatic lobe measuring 2.1 cm on image 23/2. There is gas and contrast within the  gallbladder. The gallbladder is moderately distended and findings compatible with recent ERCP. Metallic biliary stent in the common bile duct. Narrowing in the  biliary stent associated with the pancreatic mass. Small amount of pneumobilia. Small amount of fluid and stranding around the liver and gallbladder. Pancreas: Large poorly defined pancreatic head mass with multiple hypoechoic components. Difficult to accurately measure the mass itself but it roughly measures 4.0 x 4.9 x 5.1 cm. Dilatation of the main pancreatic duct. The mass extends cephalad and appears to be involving the caudate lobe. There is a caudate lesion measuring up to 4.1 cm. In addition, there is hypoechoic implant or fluid collection cephalad to the caudate lobe involving the hepatic subcapsular space. This subcapsular collection or implant measures 7.8 x 4.6 x 5.6 cm. This was present on the previous exam based on the prior report. This caudate/subcapsular component causing mass effect on the adjacent IVC. Mild stranding and edema throughout the porta hepatis region. Spleen: Small splenic calcifications.  Spleen is normal for size. Adrenals/Urinary Tract: Normal adrenal glands. Bilateral cortical and parapelvic renal cysts. No suspicious renal lesions. No hydronephrosis. Bladder is decompressed. There is probably a small bladder diverticulum along the posterior aspect of the bladder. Cannot exclude mild bladder wall thickening. Asymmetric left perinephric stranding. Stomach/Bowel: Normal appearance of the stomach. No bowel dilatation. Colonic diverticula involving the sigmoid colon. No evidence for acute bowel inflammation. Vascular/Lymphatic: Atherosclerotic disease involving the abdominal aorta without aneurysm. Atherosclerotic calcifications at the origin of the SMA. Atherosclerotic calcifications at the origin of the bilateral renal arteries. IVC is patent but there is compression in the suprarenal IVC due to the subcapsular fluid  collection / mass. 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 Main portal vein is small but patent. Intrahepatic portal veins are patent. 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 Splenic vein is patent. Pathologic lymphadenopathy in the retrocaval space measuring up to 1.7 cm on image 39/2. Reproductive: Prostate contains calcifications, asymmetric towards the left. Other: Small amount of free fluid in the abdomen and pelvis. Musculoskeletal: Evidence for an old right tenth rib fracture. Bilateral inguinal hernias containing fat. Small amount of fluid in the left inguinal hernia. No suspicious osseous lesion. IMPRESSION: 1. Large pancreatic head mass compatible with the patient's history of pancreatic cancer. The mass extends cephalad and involves the caudate lobe. There is also a subcapsular fluid collection or implant cephalad to the caudate lobe causing mass effect on the adjacent IVC. 2. Multiple liver lesions, abdominal lymphadenopathy and multiple pulmonary nodules. Findings compatible with metastatic disease. 3. Metallic biliary stent in the common bile duct and there is a small amount of pneumobilia. No significant intrahepatic biliary dilatation. In addition, there is moderate distention of the gallbladder containing iodinated contrast and small amount of gas. Findings compatible with recent ERCP. Gallbladder distension could be a source of abdominal pain. 4. Bilateral small pleural effusions with compressive atelectasis in the lower lobes. 5. Small amount of free fluid in the abdomen and pelvis. 6. Colonic diverticulosis without evidence for acute diverticulitis. 7. Aortic Atherosclerosis (ICD10-I70.0). 8. 9 mm splenic artery aneurysm. Electronically Signed   By: Richarda Overlie M.D.   On: 09/26/2022 09:50   DG Chest 2 View  Result Date: 09/26/2022 CLINICAL DATA:  77 year old male with history  of central chest pain. EXAM: CHEST - 2 VIEW COMPARISON:  Chest x-ray 07/28/2014. FINDINGS: Lung volumes are low. No consolidative airspace disease. Trace bilateral pleural effusions with bibasilar opacities that are favored to reflect subsegmental atelectasis. No pneumothorax. No evidence of pulmonary edema.  No definite suspicious appearing pulmonary nodules or masses are noted. Heart size is normal. Upper mediastinal contours are within normal limits. Atherosclerotic calcifications in the thoracic aorta. IMPRESSION: 1. Low lung volumes with trace bilateral pleural effusions and bibasilar opacities favored to reflect areas of subsegmental atelectasis. 2. Aortic atherosclerosis. Electronically Signed   By: Trudie Reed M.D.   On: 09/26/2022 06:19    PERFORMANCE STATUS (ECOG) : 1 - Symptomatic but completely ambulatory  Review of Systems Unless otherwise noted, a complete review of systems is negative.  Physical Exam General: NAD Cardiovascular: regular rate and rhythm Pulmonary: clear ant fields Abdomen: soft, nontender, + bowel sounds GU: no suprapubic tenderness Extremities: no edema, no joint deformities Skin: no rashes Neurological: Weakness but otherwise nonfocal  IMPRESSION: Patient recently diagnosed with stage IV pancreatic cancer.  Now readmitted with cholecystitis with plan for cholecystostomy drain delayed due to A-fib with RVR.  Heart rate better controlled on Cardizem.  Patient pending percutaneous cholecystostomy drain and thoracentesis.  Currently, patient states that his pain is reasonably well-controlled.  Patient, wife, and granddaughter all recognize that diagnosis of pancreatic cancer is associated with a poor prognosis.  However, they remain committed to pursuing cancer treatment once his acute issues have stabilized.  At baseline, patient lives at home with his wife.  His granddaughter lives next-door.  Patient was highly active prior to this diagnosis and was still  working daily [employer].  Patient says that he has good social support at home.  Patient does not have any advance directives but may be interested in establishing those after this hospitalization.  He would want his wife to be his decision-maker if necessary.  Patient is currently a full code.  PLAN: -Continue current scope of treatment -Continue pain regimen -Will plan outpatient follow up   Case and plan discussed with Dr. Cathie Hoops and Dr. Allena Katz  Time Total: 45 minutes  Visit consisted of counseling and education dealing with the complex and emotionally intense issues of symptom management and palliative care in the setting of serious and potentially life-threatening illness.Greater than 50%  of this time was spent counseling and coordinating care related to the above assessment and plan.  Signed by: Laurette Schimke, PhD, NP-C

## 2022-09-29 NOTE — Progress Notes (Cosign Needed Addendum)
[facility] SURGICAL PROGRESS NOTE (cpt [phone])  Hospital Day(s): 2.   Interval History:  Patient seen and examined. Noted events yesterday of atrial fibrillation with RVR. Percutaneous cholecystostomy tube placement deferred in this setting. Transferred to step down status. Remains tachycardic to 115 bpm this morning. Currently on Amiodarone. CXR with right > left pleural effusions. Labs this morning show worsening leukocytosis to 20.7K, Hgb to 12.8. Patient reports his biggest complaint is thirst. No abdominal pain, nausea, emesis. Still with SOB. He is NPO for this procedure. He is on Unasyn.   Review of Systems:  Constitutional: denies fever, chills  HEENT: denies cough or congestion  Respiratory: + SOB; denied cough  Cardiovascular: denies chest pain or palpitations  Gastrointestinal: denied abdominal pain; denied N/V Genitourinary: denies burning with urination or urinary frequency Musculoskeletal: denies pain, decreased motor or sensation  Vital signs in last 24 hours: [min-max] current  Temp:  [97.6 F (36.4 C)-98.1 F (36.7 C)] 98.1 F (36.7 C) (07/16 0200) Pulse Rate:  [85-136] 115 (07/16 0700) Resp:  [9-31] 26 (07/16 0700) BP: (70-123)/(44-92) 112/74 (07/16 0700) SpO2:  [86 %-99 %] 90 % (07/16 0700)     Height: 6' (182.9 cm) Weight: 73.9 kg BMI (Calculated): 22.1   Intake/Output last 2 shifts:  07/15 0701 - 07/16 0700 In: 3235.4 [I.V.:2465.1; IV Piggyback:770.2] Out: 215 [Urine:215]   Physical Exam:  Constitutional: alert, cooperative and no distress  HENT: normocephalic without obvious abnormality  Eyes: PERRL, EOM's grossly intact and symmetric  Respiratory: Conversationally dyspneic, on HFNC Cardiovascular: tachycardic; irregularly irregular  Gastrointestinal: soft, I do not appreciate tenderness this AM, and non-distended, no rebound/guarding Musculoskeletal: no edema or wounds, motor and sensation grossly intact, NT    Labs:     Latest Ref  Rng & Units 09/29/2022    2:01 AM 09/28/2022    4:07 PM 09/27/2022    2:21 PM  CBC  WBC 4.0 - 10.5 K/uL 20.7  18.7  21.3   Hemoglobin 13.0 - 17.0 g/dL 72.5  36.6  44.0   Hematocrit 39.0 - 52.0 % 37.3  36.5  43.4   Platelets 150 - 400 K/uL 333  327  341       Latest Ref Rng & Units 09/28/2022    4:07 PM 09/26/2022    6:55 AM 03/27/2022    5:49 AM  CMP  Glucose 70 - 99 mg/dL 347  425  956   BUN 8 - 23 mg/dL 70  15  20   Creatinine 0.61 - 1.24 mg/dL 3.87  5.64  3.32   Sodium 135 - 145 mmol/L 134  133  136   Potassium 3.5 - 5.1 mmol/L 4.4  3.4  3.5   Chloride 98 - 111 mmol/L 96  98  104   CO2 22 - 32 mmol/L 23  25  22    Calcium 8.9 - 10.3 mg/dL 7.3  7.4  8.7   Total Protein 6.5 - 8.1 g/dL  5.4  6.0   Total Bilirubin 0.3 - 1.2 mg/dL  1.1  0.8   Alkaline Phos 38 - 126 U/L  113  67   AST 15 - 41 U/L  32  19   ALT 0 - 44 U/L  24  20      Imaging studies: No new pertinent imaging studies   Assessment/Plan: (ICD-10's: K81.0) 77 y.o. male with acute cholecystitis, complicated by pertinent comorbidities including metastatic pancreatic cancer.   - Difficult situation with significant decline in clinical condition in  last 24 hours. From a surgical standpoint, he is certainly not an operative candidate in setting of malignancy and that is certainly magnified by cardiopulmonary decline in last 24 hours. I do think cardiopulmonary status certainly takes precedence. Appreciate IR assistance with possible percutaneous cholecystostomy; deferred amenability to their service. We can certainly trial management with Abx alone if he is unstable for procedures.  - I do think we should consider palliative care involvement in this setting; prognosis overall is poor  - NPO for possible procedures.   - Continue IV Abx (Unasyn) - Monitor abdominal examination; on-going bowel function    - Pain control prn; antiemetics prn  - Monitor leukocytosis  - Further management per primary service; we will follow     All of the above findings and recommendations were discussed with the patient, patient's family at bedside, and the medical team, and all of patient's and family's questions were answered to their expressed satisfaction.  -- Lynden Oxford, PA-C [facility] 09/29/2022, 7:37 AM M-F: 7am - 4pm

## 2022-09-29 NOTE — Progress Notes (Signed)
NAME:  Cameron Gutierrez, MRN:  [id number], DOB:  [birth year], LOS: 2 ADMISSION DATE:  09/26/2022, CONSULTATION DATE: 09/28/2022 REFERRING MD: Dr. Allena Katz, CHIEF COMPLAINT: Chest Pain   History of Present Illness:  This is a 77 yo male who presented to [facility] ER on 07/13 with acute onset of severe abdominal/chest pain.  He was previously admitted at [facility] from 09/21/22-09/25/22 for abdominal pain along with unintentional weight loss.  During hospitalization at [facility] workup concerning for primary metastatic pancreatic adenocarcinoma.  He underwent ERCP/US with biopsy and stent placed on 09/23/22.  His pain was controlled post procedure, and pt discharged home on 09/25/22 (prescribed oxycodone and eliquis resumed).  ED Course Upon arrival to [facility] ER significant lab results were: Na+ 133/K+ 3.4/glucose 148/calcium 7.4/albumin 2.2/wbc 15.7.  CT Abd Pelvis concerning for moderate distension of the gallbladder but no findings of acute cholecystitis, however pt continued to c/o persistent abdominal pain.  EDP consulted with [facility] Oncologist Dr. Everardo All, and following review of imaging did not feel pt required transfer to [facility].  Pt subsequently admitted to [facility] medsurg unit per hospitalist team for additional workup and treatment due to continued abdominal pain.  See detailed hospital course below under significant events.  CT Abd/Pelvis W Contrast:  Large pancreatic head mass compatible with the patient's history of pancreatic cancer. The mass extends cephalad and involves the caudate lobe. There is also a subcapsular fluid collection or implant cephalad to the caudate lobe causing mass effect on the adjacent IVC. Multiple liver lesions, abdominal lymphadenopathy and multiple pulmonary nodules. Findings compatible with metastatic disease. Metallic biliary stent in the common bile duct and there is a small amount of pneumobilia. No significant intrahepatic biliary dilatation. In addition, there is moderate distention of  the gallbladder containing iodinated contrast and small amount of gas. Findings compatible with recent ERCP. Gallbladder distension could be a source of abdominal pain. Bilateral small pleural effusions with compressive atelectasis in the lower lobes. Small amount of free fluid in the abdomen and pelvis. Colonic diverticulosis without evidence for acute diverticulitis. Aortic Atherosclerosis (ICD10-I70.0). 9 mm splenic artery aneurysm.  CXR:  Low lung volumes with trace bilateral pleural effusions and bibasilar opacities favored to reflect areas of subsegmental atelectasis. Aortic atherosclerosis.  Pertinent  Medical History  Atrial Fibrillation  Primary Metastatic Pancreatic Adenocarcinoma  Type I Diabetes Mellitus  Hypercholesteremia  HTN   Significant Hospital Events: Including procedures, antibiotic start and stop dates in addition to other pertinent events   07/13: Pt admitted to the [facility] unit with diffuse abdominal pain following recent ERCP with stent placement and newly diagnosed primary pancreatic adenocarcinoma with metastasis 07/13: [place] Abd Limited RUQ revealed Gallbladder sludge with mild gallbladder           wall thickening and small volume pericholecystic fluid. Positive sonographic        Murphy sign. Findings are concerning for acute acalculous cholecystitis. Liver        metastases seen on CT are not well identified by ultrasound. 07/14: General surgery consulted for acute acalculous cholecystitis and recommended decompression of the gallbladder via percutaneous cholecystostomy per IR and continue abx therapy.  07/15: Pt arrived to special recovery for cholecystostomy drain placement and pt noted to be in atrial fibrillation with rvr heart rate 140 to 170's.  Cardiology notified pt received 5 mg of iv cardizem x1 dose/250 ml NS bolus/20 mg of iv lasix.  Procedure canceled and pt transferred to the stepdown unit for cardizem gtt.  PCCM team consulted  to assist with management.    09/29/22- patient in no distress, met with family today.  Awaiting percutaneous cholecystostomy and thoracentesis.  Testing for TLS today.   Anti-infectives (From admission, onward)    Start     Dose/Rate Route Frequency Ordered Stop   09/27/22 1000  Ampicillin-Sulbactam (UNASYN) 3 g in sodium chloride 0.9 % 100 mL IVPB        3 g 200 mL/hr over 30 Minutes Intravenous Every 6 hours 09/27/22 0844         Interim History / Subjective:  Pt currently requiring neo-synephrine gtt @ 20 mcg/min to maintain map >65. Cardizem gtt not infusing at this time pt remains in atrial fibrillation heart rate 114 to 120's   Objective   Blood pressure 107/84, pulse (!) 113, temperature 97.7 F (36.5 C), temperature source Axillary, resp. rate (!) 21, height 6' (1.829 m), weight 73.9 kg, SpO2 93%.        Intake/Output Summary (Last 24 hours) at 09/29/2022 1011 Last data filed at 09/29/2022 1004 Gross per 24 hour  Intake 3427.71 ml  Output 215 ml  Net 3212.71 ml   Filed Weights   09/26/22 0519  Weight: 73.9 kg    Examination: General: Acute on chronically-ill appearing male, NAD on 6L HFNC  HENT: Supple, no JVD  Lungs: Diminished throughout, even, non labored  Cardiovascular: Irregular irregular, no r/g, 2+ radial/1+ distal pulses, no edema   Abdomen: Hypoactive BS x4, mild abdominal tenderness with distention, taut  Extremities: Normal bulk and tone, moves all extremities  Neuro: Alert and oriented, following commands, PERRLA GU: Indwelling foley catheter in place   Resolved Hospital Problem list     Assessment & Plan:   #Acute hypoxic respiratory failure secondary secondary to bilateral pleural effusions and atelectasis  - Supplemental O2 for dyspnea and/or hypoxia  - Maintain O2 sats 92% or higher  - Prn bronchodilator therapy  - Aggressive pulmonary hygiene - Intermittent CXR and ABG's  - Consider thoracentesis    Acute Kidney Injury -KDIGO 3   - multifactorial including  ischemic/metabolic as well as possible Tumor lysis syndrome due to presence of AKI, hyperphosphatemia, arrythmias, chest discomfort.  Will obtain uric acid, LDH, G6PD in consideration for treatment with rasburicase.  -consider nephrology consultation  -IVF   #Atrial fibrillation with rvr  #Hypotension secondary to antiarrhythmic medication  - Continuous telemetry monitoring - Will check troponin and BNP  - Cardiology consulted appreciate input: recommending if renal function normal can consider digoxin, however if pt has evidence of renal failure recommend amiodarone gtt due to cardizem gtt causing hypotension  - Echo pending  - Continue heparin gtt: dosing per pharmacy  - Prn neo-synephrine gtt to maintain map >65 - Hold outpatient antihypertensives   #Acalculous cholecystitis  - Trend WBC and monitor fever curve  - Continue  abx therapy as outlined above  - General surgery consulted: recommended decompression of the gallbladder via percutaneous cholecystostomy per IR and continue abx therapy.  Procedure canceled 07/15 due to pt developing atrial fibrillation with rvr  - Prn oxycodone/fentanyl/morphine for pain management   #Primary metastatic adenocarcinoma of the pancreas  - Oncology consulted appreciate input   #Type I diabetes mellitus  - CBG's ac/hs - SSI   Best Practice (right click and "Reselect all SmartList Selections" daily)   Diet/type: clear liquids DVT prophylaxis: systemic heparin GI prophylaxis: N/A Lines: N/A Foley:  Yes, and it is still needed Code Status:  full code Last date of multidisciplinary goals of  care discussion [N/A]  Labs   CBC: Recent Labs  Lab 09/26/22 0538 09/27/22 1421 09/28/22 1607 09/29/22 0201  WBC 15.7* 21.3* 18.7* 20.7*  HGB 13.4 14.7 12.9* 12.8*  HCT 41.3 43.4 36.5* 37.3*  MCV 86.0 82.0 79.5* 81.3  PLT 337 341 327 333    Basic Metabolic Panel: Recent Labs  Lab 09/26/22 0655 09/28/22 1607  NA 133* 134*  K 3.4* 4.4  CL  98 96*  CO2 25 23  GLUCOSE 148* 157*  BUN 15 70*  CREATININE 0.97 2.90*  CALCIUM 7.4* 7.3*   GFR: Estimated Creatinine Clearance: 23 mL/min (A) (by C-G formula based on SCr of 2.9 mg/dL (H)). Recent Labs  Lab 09/26/22 0538 09/27/22 1421 09/28/22 1607 09/29/22 0201  WBC 15.7* 21.3* 18.7* 20.7*    Liver Function Tests: Recent Labs  Lab 09/26/22 0655  AST 32  ALT 24  ALKPHOS 113  BILITOT 1.1  PROT 5.4*  ALBUMIN 2.2*   Recent Labs  Lab 09/26/22 0655  LIPASE 19   No results for input(s): "AMMONIA" in the last 168 hours.  ABG    Component Value Date/Time   HCO3 27.9 03/26/2022 2105   O2SAT 22.9 03/26/2022 2105     Coagulation Profile: Recent Labs  Lab 09/27/22 1421 09/28/22 0916  INR 1.7* 1.7*    Cardiac Enzymes: No results for input(s): "CKTOTAL", "CKMB", "CKMBINDEX", "TROPONINI" in the last 168 hours.  HbA1C: Hgb A1c MFr Bld  Date/Time Value Ref Range Status  09/26/2022 12:54 PM 7.0 (H) 4.8 - 5.6 % Final    Comment:    (NOTE)         Prediabetes: 5.7 - 6.4         Diabetes: >6.4         Glycemic control for adults with diabetes: <7.0   03/27/2022 05:49 AM 11.8 (H) 4.8 - 5.6 % Final    Comment:    (NOTE) Pre diabetes:          5.7%-6.4%  Diabetes:              >6.4%  Glycemic control for   <7.0% adults with diabetes     CBG: Recent Labs  Lab 09/28/22 1124 09/28/22 1444 09/28/22 1559 09/28/22 2126 09/29/22 0720  GLUCAP 117* 146* 156* 133* 170*    Review of Systems:   Gen: Denies fever, chills, weight change, fatigue, night sweats HEENT: Denies blurred vision, double vision, hearing loss, tinnitus, sinus congestion, rhinorrhea, sore throat, neck stiffness, dysphagia PULM: Denies shortness of breath, cough, sputum production, hemoptysis, wheezing CV: Denies chest pain, edema, orthopnea, paroxysmal nocturnal dyspnea, palpitations GI: abdominal pain, nausea, vomiting, diarrhea, hematochezia, melena, constipation, change in bowel  habits GU: Denies dysuria, hematuria, polyuria, oliguria, urethral discharge Endocrine: Denies hot or cold intolerance, polyuria, polyphagia or appetite change Derm: Denies rash, dry skin, scaling or peeling skin change Heme: Denies easy bruising, bleeding, bleeding gums Neuro: Denies headache, numbness, weakness, slurred speech, loss of memory or consciousness   Past Medical History:  He,  has a past medical history of Atrial fibrillation (HCC), Cancer (HCC), Diabetes mellitus without complication (HCC), Hypercholesteremia, and Hypertension.   Surgical History:   Past Surgical History:  Procedure Laterality Date   KNEE SURGERY     REPLACEMENT TOTAL KNEE Right    SHOULDER SURGERY       Social History:   reports that he has never smoked. He has never used smokeless tobacco. He reports that he does not currently use alcohol. He  reports that he does not use drugs.   Family History:  His family history is not on file.   Allergies No Known Allergies   Home Medications  Prior to Admission medications   Medication Sig Start Date End Date Taking? Authorizing Provider  amLODipine (NORVASC) 10 MG tablet Take 10 mg by mouth daily. 03/20/21  Yes [provider]  atorvastatin (LIPITOR) 40 MG tablet Take 40 mg by mouth every morning. 03/20/21  Yes [provider]  ELIQUIS 5 MG TABS tablet Take 5 mg by mouth 2 (two) times daily. 03/20/21  Yes [provider]  insulin glargine (LANTUS) 100 UNIT/ML injection Inject 18 Units into the skin daily.   Yes [provider]  lisinopril (ZESTRIL) 5 MG tablet Take 5 mg by mouth every morning. 03/02/21  Yes [provider]  oxyCODONE (OXY IR/ROXICODONE) 5 MG immediate release tablet Take 5 mg by mouth every 6 (six) hours as needed for severe pain.   Yes [provider]  blood glucose meter kit and supplies KIT Dispense based on patient and insurance preference. Use up to four times daily as directed. 03/27/22    Sherryll Burger, Pratik D, DO  Insulin Pen Needle (PEN NEEDLES 3/16") 31G X 5 MM MISC Take as directed with insulin pen. 03/27/22   Maurilio Lovely D, DO     Critical care provider statement:   Total critical care time: 33 minutes   Performed by: Karna Christmas MD   Critical care time was exclusive of separately billable procedures and treating other patients.   Critical care was necessary to treat or prevent imminent or life-threatening deterioration.   Critical care was time spent personally by me on the following activities: development of treatment plan with patient and/or surrogate as well as nursing, discussions with consultants, evaluation of patient's response to treatment, examination of patient, obtaining history from patient or surrogate, ordering and performing treatments and interventions, ordering and review of laboratory studies, ordering and review of radiographic studies, pulse oximetry and re-evaluation of patient's condition.    Vida Rigger, M.D.  Pulmonary & Critical Care Medicine

## 2022-09-29 NOTE — Progress Notes (Addendum)
Hematology/Oncology Progress note [phone] [phone]     Patient Care Team: Dondra Spry, MD as PCP - General (Internal Medicine) Clinic, General Medical [facility] Medicine)   Name of the patient: Cameron Gutierrez  [id number]  1945/09/12  Date of visit: 09/29/22   INTERVAL HISTORY-   No family members at the bedside. Status post thoracentesis Patient is in ICU for atrial fibrillation with rapid ventricular response.  On amiodarone drip, heparin drip, vasopressors.   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) 09/26/2022   Permanent atrial fibrillation (HCC) 09/26/2022   Hyperglycemia 03/27/2022   Pseudohyponatremia 03/27/2022   Chronic kidney disease, stage 3b (HCC) 03/27/2022   Essential hypertension 03/27/2022   Mixed hyperlipidemia 03/27/2022   History of atrial fibrillation 03/27/2022     Past Medical History:  Diagnosis Date   Atrial fibrillation (HCC)    Cancer (HCC)    pancreatic   Diabetes mellitus without complication (HCC)    Hypercholesteremia    Hypertension      Past Surgical History:  Procedure Laterality Date   KNEE SURGERY     REPLACEMENT TOTAL KNEE Right    SHOULDER SURGERY      Social History   Socioeconomic History   Marital status: Married    Spouse name: Not on file   Number of children: Not on file   Years of education: Not on file   Highest education level: Not on file  Occupational History   Not on file  Tobacco Use   Smoking status: Never   Smokeless tobacco: Never  Substance  and Sexual Activity   Alcohol use: Not Currently   Drug use: Never   Sexual activity: Not on file  Other Topics Concern   Not on file  Social History Narrative   ** Merged History Encounter **       Social Determinants of Health   Financial Resource Strain: Low Risk  (09/21/2022)   Received from [facility]   Overall Financial Resource Strain (CARDIA)    Difficulty of Paying Living Expenses: Not very hard  Food Insecurity: No Food Insecurity (09/26/2022)   Hunger Vital Sign    Worried About Running Out of Food in the Last Year: Never true    Ran Out of Food in the Last Year: Never true  Transportation Needs: No Transportation Needs (09/26/2022)   PRAPARE - Administrator, Civil Service (Medical): No    Lack of Transportation (Non-Medical): No  Physical Activity: Not on file  Stress: No Stress Concern Present (09/21/2022)   Received from [facility] - Occupational Stress Questionnaire    Feeling of Stress : Not at all  Social Connections: Not on file  Intimate Partner Violence: Not At Risk (09/26/2022)   Humiliation, Afraid, Rape, and Kick questionnaire    Fear of Current or Ex-Partner: No    Emotionally Abused: No    Physically Abused: No    Sexually Abused: No     History reviewed. No pertinent family history.   Current Facility-Administered Medications:  0.9 %  sodium chloride infusion, 250 mL, Intravenous, Continuous, Ezequiel Essex, NP, Held at 09/29/22 1140   0.9 %  sodium chloride infusion, , Intravenous, Continuous, Kolluru, Sarath, MD, Last Rate: 50 mL/hr at 09/29/22 1914, Infusion Verify at 09/29/22 1914   [COMPLETED] amiodarone (NEXTERONE) 1.8 mg/mL load via infusion 150 mg, 150 mg, Intravenous, Once, 150 mg at 09/28/22 1934 **FOLLOWED BY** [EXPIRED] amiodarone (NEXTERONE PREMIX) 360-4.14 MG/200ML-% (1.8 mg/mL) IV infusion, 60 mg/hr, Intravenous, Continuous, Stopped at 09/29/22 0151 **FOLLOWED BY**  amiodarone (NEXTERONE PREMIX) 360-4.14 MG/200ML-% (1.8 mg/mL) IV infusion, 30 mg/hr, Intravenous, Continuous, Enedina Finner, MD, Last Rate: 16.67 mL/hr at 09/29/22 1914, 30 mg/hr at 09/29/22 1914   Ampicillin-Sulbactam (UNASYN) 3 g in sodium chloride 0.9 % 100 mL IVPB, 3 g, Intravenous, Q8H, Nazari, Walid A, RPH, Stopped at 09/29/22 1831   atorvastatin (LIPITOR) tablet 40 mg, 40 mg, Oral, q morning, Enedina Finner, MD, 40 mg at 09/26/22 1457   calcium acetate (PHOSLO) capsule 1,334 mg, 1,334 mg, Oral, TID WC, Karna Christmas, Fuad, MD, 1,334 mg at 09/29/22 1755   Chlorhexidine Gluconate Cloth 2 % PADS 6 each, 6 each, Topical, QHS, Enedina Finner, MD, 6 each at 09/28/22 1612   docusate sodium (COLACE) capsule 100 mg, 100 mg, Oral, BID, Enedina Finner, MD, 100 mg at 09/26/22 2243   feeding supplement (ENSURE ENLIVE / ENSURE PLUS) liquid 237 mL, 237 mL, Oral, TID BM, Enedina Finner, MD, 237 mL at 09/26/22 2247   fentaNYL (SUBLIMAZE) injection 50 mcg, 50 mcg, Intravenous, Q4H PRN, Enedina Finner, MD, 50 mcg at 09/29/22 1427   heparin ADULT infusion 100 units/mL (25000 units/234mL), 950 Units/hr, Intravenous, Continuous, Ronnald Ramp, RPH, Last Rate: 9.5 mL/hr at 09/29/22 1943, 950 Units/hr at 09/29/22 1943   insulin aspart (novoLOG) injection 0-5 Units, 0-5 Units, Subcutaneous, QHS, Patel, Sona, MD   insulin aspart (novoLOG) injection 0-9 Units, 0-9 Units, Subcutaneous, TID WC, Enedina Finner, MD, 2 Units at 09/29/22 1812   iohexol (OMNIPAQUE) 9 MG/ML oral solution 500 mL, 500 mL, Oral, Once PRN, Ezequiel Essex, NP, 500 mL at 09/29/22 1616   levalbuterol (XOPENEX) nebulizer solution 0.63 mg, 0.63 mg, Nebulization, Q6H PRN, Ezequiel Essex, NP   metoprolol tartrate (LOPRESSOR) injection 5 mg, 5 mg, Intravenous, Q4H PRN, Enedina Finner, MD, 5 mg at 09/28/22 1456   morphine (PF) 2 MG/ML injection 2 mg, 2 mg, Intravenous, Q2H PRN, Enedina Finner, MD, 2 mg at 09/28/22 1639   multivitamin with minerals tablet 1 tablet, 1 tablet, Oral,  Daily, Enedina Finner, MD   ondansetron (ZOFRAN) tablet 4 mg, 4 mg, Oral, Q6H PRN **OR** ondansetron (ZOFRAN) injection 4 mg, 4 mg, Intravenous, Q6H PRN, Enedina Finner, MD, 4 mg at 09/28/22 1638   oxyCODONE (Oxy IR/ROXICODONE) immediate release tablet 10 mg, 10 mg, Oral, Q4H PRN, Enedina Finner, MD, 10 mg at 09/29/22 1755   phenylephrine (NEO-SYNEPHRINE) 20mg /NS premix infusion, 25-200 mcg/min, Intravenous, Titrated, Ezequiel Essex, NP, Last Rate: 18.75 mL/hr at 09/29/22 1914, 25 mcg/min at 09/29/22 1914   polyethylene glycol (MIRALAX / GLYCOLAX) packet 17 g, 17 g, Oral, Daily PRN, Enedina Finner, MD   potassium chloride SA (KLOR-CON M) CR tablet 40 mEq, 40 mEq, Oral, Once, Hammock, Sheri, NP   promethazine (PHENERGAN) 12.5 mg in sodium chloride 0.9 % 50 mL IVPB, 12.5 mg, Intravenous, Q6H PRN, Enedina Finner, MD   traMADol (ULTRAM) tablet 50 mg, 50 mg, Oral, Q6H PRN, Enedina Finner, MD, 50 mg at 09/27/22 2252   traZODone (DESYREL) tablet  25 mg, 25 mg, Oral, QHS PRN, Enedina Finner, MD, 25 mg at 09/27/22 2253   Physical exam:  Vitals:   09/29/22 1800 09/29/22 1830 09/29/22 1900 09/29/22 2013  BP: 104/78 (!) 84/61 105/82   Pulse: (!) 103 (!) 118 (!) 118   Resp: (!) 24 (!) 22 (!) 26   Temp: 97.8 F (36.6 C)   98.6 F (37 C)  TempSrc: Oral   Oral  SpO2: 98% 95% 98% 95%  Weight:      Height:       Physical Exam Constitutional:      General: He is not in acute distress.    Appearance: He is not diaphoretic.  HENT:     Head: Normocephalic and atraumatic.  Eyes:     General: No scleral icterus. Cardiovascular:     Rate and Rhythm: Tachycardia present. Rhythm irregular.  Pulmonary:     Effort: Pulmonary effort is normal. No respiratory distress.  Abdominal:     General: There is no distension.  Musculoskeletal:        General: Normal range of motion.  Skin:    General: Skin is warm.     Findings: No erythema.  Neurological:     Mental Status: He is alert and oriented to person, place, and time.  Mental status is at baseline.     Cranial Nerves: No cranial nerve deficit.     Motor: No abnormal muscle tone.  Psychiatric:        Mood and Affect: Mood and affect normal.       Labs    Latest Ref Rng & Units 09/29/2022    2:01 AM 09/28/2022    4:07 PM 09/27/2022    2:21 PM  CBC  WBC 4.0 - 10.5 K/uL 20.7  18.7  21.3   Hemoglobin 13.0 - 17.0 g/dL 16.1  09.6  04.5   Hematocrit 39.0 - 52.0 % 37.3  36.5  43.4   Platelets 150 - 400 K/uL 333  327  341       Latest Ref Rng & Units 09/29/2022    9:49 AM 09/28/2022    4:07 PM 09/26/2022    6:55 AM  CMP  Glucose 70 - 99 mg/dL 409  811  914   BUN 8 - 23 mg/dL 85  70  15   Creatinine 0.61 - 1.24 mg/dL 7.82  9.56  2.13   Sodium 135 - 145 mmol/L 134  134  133   Potassium 3.5 - 5.1 mmol/L 3.7  4.4  3.4   Chloride 98 - 111 mmol/L 97  96  98   CO2 22 - 32 mmol/L 20  23  25    Calcium 8.9 - 10.3 mg/dL 7.0  7.3  7.4   Total Protein 6.5 - 8.1 g/dL 5.6   5.4   Total Bilirubin 0.3 - 1.2 mg/dL 1.6   1.1   Alkaline Phos 38 - 126 U/L 192   113   AST 15 - 41 U/L 222   32   ALT 0 - 44 U/L 106   24      RADIOGRAPHIC STUDIES: I have personally reviewed the radiological images as listed and agreed with the findings in the report. US THORACENTESIS ASP PLEURAL SPACE W/IMG GUIDE  Result Date: 09/29/2022 INDICATION: Right pleural effusion.  Pancreatic cancer. EXAM: ULTRASOUND GUIDED RIGHT THORACENTESIS MEDICATIONS: None. COMPLICATIONS: None immediate. PROCEDURE: An ultrasound guided thoracentesis was thoroughly discussed with the patient and questions answered. The benefits, risks, alternatives and  complications were also discussed. The patient understands and wishes to proceed with the procedure. Written consent was obtained. Ultrasound was performed to localize and mark an adequate pocket of fluid in the right chest. The area was then prepped and draped in the normal sterile fashion. 1% Lidocaine was used for local anesthesia. Under ultrasound guidance a 6  Fr Safe-T-Centesis catheter was introduced. Thoracentesis was performed. The catheter was removed and a dressing applied. FINDINGS: A total of approximately 800 mL of dark yellow fluid was removed. Samples were sent to the laboratory as requested by the clinical team. Images were also obtained of the right upper abdomen to evaluate the gallbladder. Loculated perihepatic ascites is identified. Previously identified gallbladder is poorly characterized because there appears to be air within the gallbladder. The amount of fluid around the liver has significantly increased since the right upper quadrant ultrasound 06/27/2022. IMPRESSION: 1. Successful ultrasound guided right thoracentesis yielding 800 mL of pleural fluid. 2. Images of the right upper quadrant demonstrate markedly increased perihepatic ascites. Ascites appears to be complex. In addition, the gallbladder now appears to contain a large amount of gas which is likely related to the biliary stent and pneumobilia. Electronically Signed   By: Richarda Overlie M.D.   On: 09/29/2022 15:52   DG Chest Port 1 View  Result Date: 09/29/2022 CLINICAL DATA:  Pleural effusion.  Status post thoracentesis. EXAM: PORTABLE CHEST 1 VIEW COMPARISON:  Chest radiographs 09/28/2022 and 09/26/2022. Abdominal CT 09/26/2022. FINDINGS: 1417 hours. Interval decreased volume of right pleural effusion. A left pleural effusion and left basilar airspace disease appear unchanged. No pneumothorax. The heart size and mediastinal contours are stable. Moderate gaseous distension of the stomach noted. Curvilinear lucency in the right upper quadrant corresponds with air in the gallbladder lumen on recent CT. Patient has a biliary stent. IMPRESSION: 1. Decreased right pleural effusion following thoracentesis. No pneumothorax. 2. No significant change in left pleural effusion and left basilar airspace disease. Electronically Signed   By: Carey Bullocks M.D.   On: 09/29/2022 15:46   ECHOCARDIOGRAM  COMPLETE  Result Date: 09/29/2022    ECHOCARDIOGRAM REPORT   Patient Name:   Cameron Gutierrez Date of Exam: 09/29/2022 Medical Rec #:  [id number]       Height:       72.0 in Accession #:    [id number]      Weight:       163.0 lb Date of Birth:  06/10/1945       BSA:          1.953 m Patient Age:    76 years        BP:           112/74 mmHg Patient Gender: M               HR:           115 bpm. Exam Location:  [facility] Procedure: 2D Echo, Cardiac Doppler and Color Doppler Indications:     Atrial Fibrillation I48.91  History:         Patient has no prior history of Echocardiogram examinations.                  Arrythmias:Atrial Fibrillation; Risk Factors:Diabetes and                  Hypertension.  Sonographer:     Cristela Blue Referring Phys:  [id number] SONA PATEL Diagnosing Phys: Yvonne Kendall MD IMPRESSIONS  1. Left ventricular ejection fraction,  by estimation, is 60 to 65%. The left ventricle has normal function. The left ventricle has no regional wall motion abnormalities. There is mild asymmetric left ventricular hypertrophy of the basal-septal segment. Left ventricular diastolic function could not be evaluated.  2. Pulmonary artery pressure is mildly elevated (RVSP 30-35 mmHg plus central venous/right atrial pressure). Right ventricular systolic function is normal. The right ventricular size is normal.  3. Moderate pleural effusion in the right lateral region.  4. The mitral valve is normal in structure. Mild to moderate mitral valve regurgitation.  5. Tricuspid valve regurgitation is mild to moderate.  6. The aortic valve is tricuspid. Aortic valve regurgitation is mild. No aortic stenosis is present. FINDINGS  Left Ventricle: Left ventricular ejection fraction, by estimation, is 60 to 65%. The left ventricle has normal function. The left ventricle has no regional wall motion abnormalities. The left ventricular internal cavity size was normal in size. There is  mild asymmetric left ventricular hypertrophy of the  basal-septal segment. Left ventricular diastolic function could not be evaluated due to atrial fibrillation. Left ventricular diastolic function could not be evaluated. Right Ventricle: Pulmonary artery pressure is mildly elevated (RVSP 30-35 mmHg plus central venous/right atrial pressure). The right ventricular size is normal. No increase in right ventricular wall thickness. Right ventricular systolic function is normal. Left Atrium: Left atrial size was normal in size. Right Atrium: Right atrial size was normal in size. Pericardium: The pericardium was not well visualized. Mitral Valve: The mitral valve is normal in structure. Mild to moderate mitral valve regurgitation. Tricuspid Valve: The tricuspid valve is normal in structure. Tricuspid valve regurgitation is mild to moderate. Aortic Valve: The aortic valve is tricuspid. Aortic valve regurgitation is mild. No aortic stenosis is present. Aortic valve mean gradient measures 3.0 mmHg. Aortic valve peak gradient measures 4.7 mmHg. Aortic valve area, by VTI measures 3.27 cm. Pulmonic Valve: The pulmonic valve was grossly normal. Pulmonic valve regurgitation is trivial. No evidence of pulmonic stenosis. Aorta: The aortic root is normal in size and structure. Pulmonary Artery: The pulmonary artery is not well seen. Venous: The inferior vena cava was not well visualized. IAS/Shunts: The interatrial septum was not well visualized. Additional Comments: There is a moderate pleural effusion in the right lateral region.  LEFT VENTRICLE PLAX 2D LVIDd:         3.90 cm LVIDs:         2.60 cm LV PW:         0.90 cm LV IVS:        1.20 cm LVOT diam:     2.20 cm LV SV:         47 LV SV Index:   24 LVOT Area:     3.80 cm  RIGHT VENTRICLE RV Basal diam:  3.50 cm RV Mid diam:    2.80 cm LEFT ATRIUM             Index        RIGHT ATRIUM           Index LA diam:        4.30 cm 2.20 cm/m   RA Area:     16.20 cm LA Vol (A2C):   70.1 ml 35.89 ml/m  RA Volume:   34.00 ml  17.41  ml/m LA Vol (A4C):   47.7 ml 24.42 ml/m LA Biplane Vol: 60.7 ml 31.08 ml/m  AORTIC VALVE AV Area (Vmax):    2.81 cm AV Area (Vmean):   2.73 cm  AV Area (VTI):     3.27 cm AV Vmax:           108.00 cm/s AV Vmean:          74.350 cm/s AV VTI:            0.143 m AV Peak Grad:      4.7 mmHg AV Mean Grad:      3.0 mmHg LVOT Vmax:         79.80 cm/s LVOT Vmean:        53.400 cm/s LVOT VTI:          0.123 m LVOT/AV VTI ratio: 0.86  AORTA Ao Root diam: 3.66 cm MITRAL VALVE               TRICUSPID VALVE MV Area (PHT): 3.63 cm    TR Peak grad:   32.9 mmHg MV Decel Time: 209 msec    TR Vmax:        287.00 cm/s MV E velocity: 86.50 cm/s                            SHUNTS                            Systemic VTI:  0.12 m                            Systemic Diam: 2.20 cm Yvonne Kendall MD Electronically signed by Yvonne Kendall MD Signature Date/Time: 09/29/2022/2:31:41 PM    Final    US RENAL  Result Date: 09/29/2022 CLINICAL DATA:  Acute renal failure. EXAM: RENAL / URINARY TRACT ULTRASOUND COMPLETE COMPARISON:  September 26, 2022. FINDINGS: Right Kidney: Renal measurements: 9.6 x 5.1 x 4.7 cm = volume: 122 mL. At least 2 parapelvic cysts are noted, the largest measuring 2.3 cm. Increased echogenicity of renal parenchyma is noted. No mass or hydronephrosis visualized. Left Kidney: Renal measurements: 10.1 x 5.9 x 5.5 cm = volume: 170 mL. Multi septated cyst measuring 2.3 x 2.3 x 1.9 cm is noted. 2.5 cm simple cyst is noted. Increased echogenicity of renal parenchyma is noted. No hydronephrosis visualized. Bladder: Appears normal for degree of bladder distention. Other: None. IMPRESSION: 2.3 cm multi-septated complex cyst seen in left kidney. Follow-up ultrasound in 6-12 months is recommended to ensure stability. Increased echogenicity of renal parenchyma is noted bilaterally suggesting medical renal disease. No hydronephrosis or renal obstruction is noted. Electronically Signed   By: Lupita Raider M.D.   On: 09/29/2022  13:28   DG Chest Port 1 View  Result Date: 09/28/2022 CLINICAL DATA:  Shortness of breath. EXAM: PORTABLE CHEST 1 VIEW COMPARISON:  September 26, 2022 FINDINGS: Calcific atherosclerotic disease of the aorta. Cardiomediastinal silhouette is enlarged. Mediastinal contours appear intact. Bilateral pleural effusions, right greater than left. No focal airspace consolidation. Osseous structures are without acute abnormality. Soft tissues are grossly normal. IMPRESSION: 1. Bilateral pleural effusions, right greater than left. 2. Enlarged cardiomediastinal silhouette. Electronically Signed   By: Ted Mcalpine M.D.   On: 09/28/2022 15:37   US Abdomen Limited RUQ (LIVER/GB)  Result Date: 09/26/2022 CLINICAL DATA:  Abdominal pain. History of metastatic pancreatic cancer. EXAM: ULTRASOUND ABDOMEN LIMITED RIGHT UPPER QUADRANT COMPARISON:  CT abdomen pelvis from same day. FINDINGS: Gallbladder: No gallstones. Sludge with mild gallbladder wall thickening and small volume pericholecystic fluid. Positive sonographic  Murphy sign noted by sonographer. Common bile duct: Diameter: 4 mm, normal.  Common bile duct stent noted. Liver: Liver lesions seen on CT are not well identified by ultrasound. Within normal limits in parenchymal echogenicity. Portal vein is patent on color Doppler imaging with normal direction of blood flow towards the liver. Other: None. IMPRESSION: 1. Gallbladder sludge with mild gallbladder wall thickening and small volume pericholecystic fluid. Positive sonographic Murphy sign. Findings are concerning for acute acalculous cholecystitis. 2. Liver metastases seen on CT are not well identified by ultrasound. Electronically Signed   By: Obie Dredge M.D.   On: 09/26/2022 14:56   CT ABDOMEN PELVIS W CONTRAST  Result Date: 09/26/2022 CLINICAL DATA:  Central chest pain. Abdominal pain. History of pancreatic cancer. EXAM: CT ABDOMEN AND PELVIS WITH CONTRAST TECHNIQUE: Multidetector CT imaging of the abdomen  and pelvis was performed using the standard protocol following bolus administration of intravenous contrast. RADIATION DOSE REDUCTION: This exam was performed according to the departmental dose-optimization program which includes automated exposure control, adjustment of the mA and/or kV according to patient size and/or use of iterative reconstruction technique. CONTRAST:  80mL OMNIPAQUE IOHEXOL 300 MG/ML  SOLN COMPARISON:  Chest radiograph 09/26/2022. Outside CTs from [facility] dated 09/21/2022 and 09/22/2022. Outside images are unavailable but reports are available. FINDINGS: Lower chest: Multiple small pulmonary nodules in the visualized lungs. Index pulmonary nodule is in the right lower lobe along the right major fissure on image 33/4 measuring up to 1.3 cm. There also may be a larger nodule in the left lower lobe measuring 1.4 cm image 15/4. Volume loss and compressive atelectasis in the left lower lobe. Compressive atelectasis in the right lower lobe. Bilateral small pleural effusions. Hepatobiliary: Multiple ill-defined hypoechoic liver lesions compatible with metastatic disease. Index lesion in the left hepatic lobe measuring 2.1 cm on image 23/2. There is gas and contrast within the gallbladder. The gallbladder is moderately distended and findings compatible with recent ERCP. Metallic biliary stent in the common bile duct. Narrowing in the biliary stent associated with the pancreatic mass. Small amount of pneumobilia. Small amount of fluid and stranding around the liver and gallbladder. Pancreas: Large poorly defined pancreatic head mass with multiple hypoechoic components. Difficult to accurately measure the mass itself but it roughly measures 4.0 x 4.9 x 5.1 cm. Dilatation of the main pancreatic duct. The mass extends cephalad and appears to be involving the caudate lobe. There is a caudate lesion measuring up to 4.1 cm. In addition, there is hypoechoic implant or fluid collection cephalad to the caudate lobe  involving the hepatic subcapsular space. This subcapsular collection or implant measures 7.8 x 4.6 x 5.6 cm. This was present on the previous exam based on the prior report. This caudate/subcapsular component causing mass effect on the adjacent IVC. Mild stranding and edema throughout the porta hepatis region. Spleen: Small splenic calcifications.  Spleen is normal for size. Adrenals/Urinary Tract: Normal adrenal glands. Bilateral cortical and parapelvic renal cysts. No suspicious renal lesions. No hydronephrosis. Bladder is decompressed. There is probably a small bladder diverticulum along the posterior aspect of the bladder. Cannot exclude mild bladder wall thickening. Asymmetric left perinephric stranding. Stomach/Bowel: Normal appearance of the stomach. No bowel dilatation. Colonic diverticula involving the sigmoid colon. No evidence for acute bowel inflammation. Vascular/Lymphatic: Atherosclerotic disease involving the abdominal aorta without aneurysm. Atherosclerotic calcifications at the origin of the SMA. Atherosclerotic calcifications at the origin of the bilateral renal arteries. IVC is patent but there is compression in the suprarenal IVC  due to the subcapsular fluid collection / mass. 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 Main portal vein is small but patent. Intrahepatic portal veins are patent. 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 Splenic vein is patent. Pathologic lymphadenopathy in the retrocaval space measuring up to 1.7 cm on image 39/2. Reproductive: Prostate contains calcifications, asymmetric towards the left. Other: Small amount of free fluid in the abdomen and pelvis. Musculoskeletal: Evidence for an old right tenth rib fracture. Bilateral inguinal hernias containing fat. Small amount of fluid in the left inguinal hernia. No suspicious osseous lesion. IMPRESSION: 1. Large  pancreatic head mass compatible with the patient's history of pancreatic cancer. The mass extends cephalad and involves the caudate lobe. There is also a subcapsular fluid collection or implant cephalad to the caudate lobe causing mass effect on the adjacent IVC. 2. Multiple liver lesions, abdominal lymphadenopathy and multiple pulmonary nodules. Findings compatible with metastatic disease. 3. Metallic biliary stent in the common bile duct and there is a small amount of pneumobilia. No significant intrahepatic biliary dilatation. In addition, there is moderate distention of the gallbladder containing iodinated contrast and small amount of gas. Findings compatible with recent ERCP. Gallbladder distension could be a source of abdominal pain. 4. Bilateral small pleural effusions with compressive atelectasis in the lower lobes. 5. Small amount of free fluid in the abdomen and pelvis. 6. Colonic diverticulosis without evidence for acute diverticulitis. 7. Aortic Atherosclerosis (ICD10-I70.0). 8. 9 mm splenic artery aneurysm. Electronically Signed   By: Richarda Overlie M.D.   On: 09/26/2022 09:50   DG Chest 2 View  Result Date: 09/26/2022 CLINICAL DATA:  77 year old male with history of central chest pain. EXAM: CHEST - 2 VIEW COMPARISON:  Chest x-ray 07/28/2014. FINDINGS: Lung volumes are low. No consolidative airspace disease. Trace bilateral pleural effusions with bibasilar opacities that are favored to reflect subsegmental atelectasis. No pneumothorax. No evidence of pulmonary edema. No definite suspicious appearing pulmonary nodules or masses are noted. Heart size is normal. Upper mediastinal contours are within normal limits. Atherosclerotic calcifications in the thoracic aorta. IMPRESSION: 1. Low lung volumes with trace bilateral pleural effusions and bibasilar opacities favored to reflect areas of subsegmental atelectasis. 2. Aortic atherosclerosis. Electronically Signed   By: Trudie Reed M.D.   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 Prognosis is poor. If patient is interested in proceeding with treatment, I will recommend palliative chemotherapy outpatient after his acute issues resolves.   # Acute acalculous cholecystitis cholecystostomy drain placement was canceled due to atrial fibrillation with RVR. Continue IV antibiotics and pain regimen.   # Atrial fibrillation with RVR. Continue anticoagulation with heparin drip Amiodarone gtt.  Cardiology on board   # AKI, possibly secondary to recent IV contrast use.  Nephrology was consulted. Check SPEP and light chain ratio. Tumor lysis associated with solid tumor is very rare, less likely  # Pleural effusion, check echocardiogram.  Status post thoracentesis.  Fluid studies pending. Thank you for allowing me to participate in the care of this patient.   Rickard Patience, MD, PhD Hematology Oncology 09/29/2022

## 2022-09-29 NOTE — Progress Notes (Signed)
Metastatic Pancreatic Cancer w worsening clinical condition now w multiorgan failure. CT concerning for worsening biliary process. Unfortunately not a surgical candidate. He is actively dying and surgery will likely accelerate his dying process and will deprive him from spending time with his family.  Unfortunately not much we can offer him. D/w ICU team in detail.

## 2022-09-29 NOTE — Progress Notes (Signed)
ANTICOAGULATION CONSULT NOTE   Pharmacy Consult for IV heparin Indication: atrial fibrillation  No Known Allergies  Patient Measurements: Height: 6' (182.9 cm) Weight: 73.9 kg (163 lb) IBW/kg (Calculated) : 77.6 Heparin Dosing Weight: 73.9 kg  Vital Signs: Temp: 98.1 F (36.7 C) (07/16 0200) Temp Source: Axillary (07/16 0200) BP: 96/71 (07/16 0230) Pulse Rate: 112 (07/16 0230)  Labs: Recent Labs    09/26/22 0655 09/27/22 1421 09/28/22 0916 09/28/22 1607 09/28/22 1834 09/29/22 0201  HGB  --  14.7  --  12.9*  --  12.8*  HCT  --  43.4  --  36.5*  --  37.3*  PLT  --  341  --  327  --  333  APTT  --   --   --  39*  --  109*  LABPROT  --  20.5* 20.0*  --   --   --   INR  --  1.7* 1.7*  --   --   --   HEPARINUNFRC  --   --   --  0.71*  --  0.71*  CREATININE 0.97  --   --  2.90*  --   --   TROPONINIHS 6 11  --   --  23*  --     Estimated Creatinine Clearance: 23 mL/min (A) (by C-G formula based on SCr of 2.9 mg/dL (H)).   Medical History: Past Medical History:  Diagnosis Date   Atrial fibrillation (HCC)    Cancer (HCC)    pancreatic   Diabetes mellitus without complication (HCC)    Hypercholesteremia    Hypertension     Medications:  Eliquis 5 mg BID (last dose 09/24/22)  Assessment: 77 year old male with PMH atrial fibrillation on Eliquis prior to admission. Plan was for cholecystectomy drain placement, but was cancelled due to acute Afib. Last dose was > 2 days ago.   7/16 0201 aPTT 109 and heparin level 0.7.   Goal of Therapy:  HL 0.3 - 0.7 once heparin level and aPTT correlate.  aPTT 66-102 seconds Monitor platelets by anticoagulation protocol: Yes   Plan:  Both heparin level and aPTT are slightly elevated. Levels are possibly correlating, but I will continue with aPTT monitoring for another 24 hours. Will decrease the heparin infusion to 950 units/hr. Recheck aPTT in 8 hours. CBC and heparin level daily while on heparin.    Paschal Dopp, PharmD,  BCPS Clinical Pharmacist  09/29/2022 2:43 AM

## 2022-09-29 NOTE — Progress Notes (Signed)
Acute Emphysematous Cholecystitis  CT abdomen/pelvis wo contrast 09/29/22: Acute emphysematous cholecystitis. Recommend emergent surgical consultation. Interval development of pneumoperitoneum. Interval increase in small volume perihepatic ascites. Gastric lumen distended with air-fluid level. Developing obstruction along the first portion of duodenum in the region of the pancreatic malignancy not excluded. Metastatic pancreatic malignancy poorly evaluated on this noncontrast study. Please see CT abdomen pelvis 09/26/2022 for further details. Partially visualized bilateral small pleural effusions with bilateral lower lobe passive atelectasis.  Aortic Atherosclerosis  - discussed with Dr. Everlene Farrier, patient not a surgical candidate due to co-morbidities - discussed with Pharmacy, antibiotics adjusted to Zosyn - updated patient and spouse bedside, updated son- per their request  Discussed overall poor prognosis due to life-threatening infection and metastatic cancer. Wife stated there is always hope. Patient had no questions at this time. Would recommend palliative and transition to comfort measures.    Cheryll Cockayne Rust-Chester, AGACNP-BC Acute Care Nurse Practitioner [facility]   [phone] / [phone] Please see Amion for details.

## 2022-09-29 NOTE — Progress Notes (Signed)
Pt overall critically ill. Remains on Pressors,amiodarone gtt, heparin gtt and has worsening labs. D/ w PCCM dr Merry Lofty take over patient care.  D/w pt's grand-dter and wife Eunice Blase in the room

## 2022-09-30 ENCOUNTER — Inpatient Hospital Stay: Payer: Medicare Other

## 2022-09-30 DIAGNOSIS — K810 Acute cholecystitis: Secondary | ICD-10-CM | POA: Diagnosis not present

## 2022-09-30 DIAGNOSIS — C259 Malignant neoplasm of pancreas, unspecified: Secondary | ICD-10-CM | POA: Diagnosis not present

## 2022-09-30 DIAGNOSIS — I4891 Unspecified atrial fibrillation: Secondary | ICD-10-CM | POA: Diagnosis not present

## 2022-09-30 LAB — CBC
HCT: 34.5 % — ABNORMAL LOW (ref 39.0–52.0)
Hemoglobin: 12.4 g/dL — ABNORMAL LOW (ref 13.0–17.0)
MCH: 27.9 pg (ref 26.0–34.0)
MCHC: 35.9 g/dL (ref 30.0–36.0)
MCV: 77.5 fL — ABNORMAL LOW (ref 80.0–100.0)
Platelets: 288 10*3/uL (ref 150–400)
RBC: 4.45 MIL/uL (ref 4.22–5.81)
RDW: 13.8 % (ref 11.5–15.5)
WBC: 17.7 10*3/uL — ABNORMAL HIGH (ref 4.0–10.5)
nRBC: 0 % (ref 0.0–0.2)

## 2022-09-30 LAB — APTT: aPTT: 114 seconds — ABNORMAL HIGH (ref 24–36)

## 2022-09-30 LAB — PROTIME-INR
INR: 2 — ABNORMAL HIGH (ref 0.8–1.2)
Prothrombin Time: 23.2 seconds — ABNORMAL HIGH (ref 11.4–15.2)

## 2022-09-30 LAB — COMPREHENSIVE METABOLIC PANEL
ALT: 174 U/L — ABNORMAL HIGH (ref 0–44)
AST: 424 U/L — ABNORMAL HIGH (ref 15–41)
Albumin: 1.9 g/dL — ABNORMAL LOW (ref 3.5–5.0)
Alkaline Phosphatase: 277 U/L — ABNORMAL HIGH (ref 38–126)
Anion gap: 13 (ref 5–15)
BUN: 89 mg/dL — ABNORMAL HIGH (ref 8–23)
CO2: 22 mmol/L (ref 22–32)
Calcium: 7.1 mg/dL — ABNORMAL LOW (ref 8.9–10.3)
Chloride: 98 mmol/L (ref 98–111)
Creatinine, Ser: 3.05 mg/dL — ABNORMAL HIGH (ref 0.61–1.24)
GFR, Estimated: 21 mL/min — ABNORMAL LOW (ref >60)
Glucose, Bld: 138 mg/dL — ABNORMAL HIGH (ref 70–99)
Potassium: 3.7 mmol/L (ref 3.5–5.1)
Sodium: 133 mmol/L — ABNORMAL LOW (ref 135–145)
Total Bilirubin: 1.6 mg/dL — ABNORMAL HIGH (ref 0.3–1.2)
Total Protein: 5.2 g/dL — ABNORMAL LOW (ref 6.5–8.1)

## 2022-09-30 LAB — KAPPA/LAMBDA LIGHT CHAINS
Kappa free light chain: 94.8 mg/L — ABNORMAL HIGH (ref 3.3–19.4)
Kappa, lambda light chain ratio: 1.26 (ref 0.26–1.65)
Lambda free light chains: 75 mg/L — ABNORMAL HIGH (ref 5.7–26.3)

## 2022-09-30 LAB — GLUCOSE, CAPILLARY
Glucose-Capillary: 129 mg/dL — ABNORMAL HIGH (ref 70–99)
Glucose-Capillary: 138 mg/dL — ABNORMAL HIGH (ref 70–99)
Glucose-Capillary: 118 mg/dL — ABNORMAL HIGH (ref 70–99)
Glucose-Capillary: 135 mg/dL — ABNORMAL HIGH (ref 70–99)
Glucose-Capillary: 109 mg/dL — ABNORMAL HIGH (ref 70–99)
Glucose-Capillary: 109 mg/dL — ABNORMAL HIGH (ref 70–99)

## 2022-09-30 LAB — HEPARIN LEVEL (UNFRACTIONATED): Heparin Unfractionated: 0.38 IU/mL (ref 0.30–0.70)

## 2022-09-30 LAB — MAGNESIUM: Magnesium: 2.5 mg/dL — ABNORMAL HIGH (ref 1.7–2.4)

## 2022-09-30 LAB — PHOSPHORUS: Phosphorus: 7.7 mg/dL — ABNORMAL HIGH (ref 2.5–4.6)

## 2022-09-30 LAB — ANTITHROMBIN PANEL
AT III AG PPP IMM-ACNC: 88 % (ref 72–124)
Antithrombin Activity: 85 % (ref 75–135)

## 2022-09-30 LAB — CANCER ANTIGEN 19-9: CA 19-9: 816 U/mL — ABNORMAL HIGH (ref 0–35)

## 2022-09-30 LAB — HEPATITIS B SURFACE ANTIGEN: Hepatitis B Surface Ag: NONREACTIVE

## 2022-09-30 MED ORDER — MIDAZOLAM HCL 2 MG/2ML IJ SOLN
INTRAMUSCULAR | Status: AC
  Filled 2022-09-30: qty 2

## 2022-09-30 MED ORDER — HEPARIN (PORCINE) 25000 UT/250ML-% IV SOLN
1550.0000 [IU]/h | INTRAVENOUS | Status: DC
  Administered 2022-10-01: 850 [IU]/h via INTRAVENOUS
  Administered 2022-10-03: 1100 [IU]/h via INTRAVENOUS
  Administered 2022-10-05 – 2022-10-06 (×3): 1400 [IU]/h via INTRAVENOUS
  Filled 2022-09-30 (×7): qty 250

## 2022-09-30 MED ORDER — FENTANYL CITRATE (PF) 100 MCG/2ML IJ SOLN
INTRAMUSCULAR | Status: AC | PRN
  Administered 2022-09-30: 25 ug via INTRAVENOUS

## 2022-09-30 MED ORDER — MIDAZOLAM HCL 2 MG/2ML IJ SOLN
INTRAMUSCULAR | Status: AC | PRN
  Administered 2022-09-30: .5 mg via INTRAVENOUS

## 2022-09-30 MED ORDER — SODIUM CHLORIDE 0.9% FLUSH
5.0000 mL | Freq: Three times a day (TID) | INTRAVENOUS | Status: DC
  Administered 2022-09-30 – 2022-10-13 (×34): 5 mL

## 2022-09-30 MED ORDER — FENTANYL CITRATE (PF) 100 MCG/2ML IJ SOLN
INTRAMUSCULAR | Status: AC
  Filled 2022-09-30: qty 2

## 2022-09-30 NOTE — Plan of Care (Signed)
   Problem: Education: Goal: Ability to describe self-care measures that may prevent or decrease complications (Diabetes Survival Skills Education) will improve Outcome: Not Progressing   Problem: Coping: Goal: Ability to adjust to condition or change in health will improve Outcome: Not Progressing   Problem: Fluid Volume: Goal: Ability to maintain a balanced intake and output will improve Outcome: Not Progressing   Problem: Health Behavior/Discharge Planning: Goal: Ability to identify and utilize available resources and services will improve Outcome: Not Progressing Goal: Ability to manage health-related needs will improve Outcome: Not Progressing   Problem: Metabolic: Goal: Ability to maintain appropriate glucose levels will improve Outcome: Not Progressing   Problem: Nutritional: Goal: Maintenance of adequate nutrition will improve Outcome: Not Progressing Goal: Progress toward achieving an optimal weight will improve Outcome: Not Progressing   Problem: Skin Integrity: Goal: Risk for impaired skin integrity will decrease Outcome: Not Progressing   Problem: Tissue Perfusion: Goal: Adequacy of tissue perfusion will improve Outcome: Not Progressing   Problem: Education: Goal: Knowledge of General Education information will improve Description: Including pain rating scale, medication(s)/side effects and non-pharmacologic comfort measures Outcome: Not Progressing   Problem: Health Behavior/Discharge Planning: Goal: Ability to manage health-related needs will improve Outcome: Not Progressing   Problem: Clinical Measurements: Goal: Ability to maintain clinical measurements within normal limits will improve Outcome: Not Progressing Goal: Will remain free from infection Outcome: Not Progressing Goal: Diagnostic test results will improve Outcome: Not Progressing Goal: Respiratory complications will improve Outcome: Not Progressing Goal: Cardiovascular complication will  be avoided Outcome: Not Progressing   Problem: Activity: Goal: Risk for activity intolerance will decrease Outcome: Not Progressing   Problem: Nutrition: Goal: Adequate nutrition will be maintained Outcome: Not Progressing   Problem: Coping: Goal: Level of anxiety will decrease Outcome: Not Progressing   Problem: Elimination: Goal: Will not experience complications related to bowel motility Outcome: Not Progressing Goal: Will not experience complications related to urinary retention Outcome: Not Progressing   Problem: Pain Managment: Goal: General experience of comfort will improve Outcome: Not Progressing   Problem: Safety: Goal: Ability to remain free from injury will improve Outcome: Not Progressing   Problem: Skin Integrity: Goal: Risk for impaired skin integrity will decrease Outcome: Not Progressing

## 2022-09-30 NOTE — Progress Notes (Signed)
Responded to page per care team. Pt in and out of sleeping, did not want to bother. Spoke with wife at bedside, she shared her husband new medical information and where pt felt he was at with it all, she shared he asked God for forgiveness and wanted to make sure wife ne ppt wishes. Wife shared she was sad with new news but processing and wanting to simply honor Cameron Gutierrez wishes through this all. She would like a chaplain to come back later today to discuss more in depth and speak with pt as we did not want to wake him up. Chaplain services will follow up. Please call us sooner if needs arise

## 2022-09-30 NOTE — Progress Notes (Signed)
NAME:  Cameron Gutierrez, MRN:  [id number], DOB:  1945/07/08, LOS: 3 ADMISSION DATE:  09/26/2022, CONSULTATION DATE: 09/28/2022 REFERRING MD: Dr. Allena Katz, CHIEF COMPLAINT: Chest Pain   History of Present Illness:  This is a 77 yo male who presented to [facility] ER on 07/13 with acute onset of severe abdominal/chest pain.  He was previously admitted at [facility] from 09/21/22-09/25/22 for abdominal pain along with unintentional weight loss.  During hospitalization at [facility] workup concerning for primary metastatic pancreatic adenocarcinoma.  He underwent ERCP/US with biopsy and stent placed on 09/23/22.  His pain was controlled post procedure, and pt discharged home on 09/25/22 (prescribed oxycodone and eliquis resumed).  ED Course Upon arrival to [facility] ER significant lab results were: Na+ 133/K+ 3.4/glucose 148/calcium 7.4/albumin 2.2/wbc 15.7.  CT Abd Pelvis concerning for moderate distension of the gallbladder but no findings of acute cholecystitis, however pt continued to c/o persistent abdominal pain.  EDP consulted with [facility] Oncologist Dr. Everardo All, and following review of imaging did not feel pt required transfer to [facility].  Pt subsequently admitted to [facility] medsurg unit per hospitalist team for additional workup and treatment due to continued abdominal pain.  See detailed hospital course below under significant events.  CT Abd/Pelvis W Contrast:  Large pancreatic head mass compatible with the patient's history of pancreatic cancer. The mass extends cephalad and involves the caudate lobe. There is also a subcapsular fluid collection or implant cephalad to the caudate lobe causing mass effect on the adjacent IVC. Multiple liver lesions, abdominal lymphadenopathy and multiple pulmonary nodules. Findings compatible with metastatic disease. Metallic biliary stent in the common bile duct and there is a small amount of pneumobilia. No significant intrahepatic biliary dilatation. In addition, there is moderate distention of  the gallbladder containing iodinated contrast and small amount of gas. Findings compatible with recent ERCP. Gallbladder distension could be a source of abdominal pain. Bilateral small pleural effusions with compressive atelectasis in the lower lobes. Small amount of free fluid in the abdomen and pelvis. Colonic diverticulosis without evidence for acute diverticulitis. Aortic Atherosclerosis (ICD10-I70.0). 9 mm splenic artery aneurysm.  CXR:  Low lung volumes with trace bilateral pleural effusions and bibasilar opacities favored to reflect areas of subsegmental atelectasis. Aortic atherosclerosis.  Pertinent  Medical History  Atrial Fibrillation  Primary Metastatic Pancreatic Adenocarcinoma  Type I Diabetes Mellitus  Hypercholesteremia  HTN   Significant Hospital Events: Including procedures, antibiotic start and stop dates in addition to other pertinent events   07/13: Pt admitted to the [facility] unit with diffuse abdominal pain following recent ERCP with stent placement and newly diagnosed primary pancreatic adenocarcinoma with metastasis 07/13: [place] Abd Limited RUQ revealed Gallbladder sludge with mild gallbladder           wall thickening and small volume pericholecystic fluid. Positive sonographic        Murphy sign. Findings are concerning for acute acalculous cholecystitis. Liver        metastases seen on CT are not well identified by ultrasound. 07/14: General surgery consulted for acute acalculous cholecystitis and recommended decompression of the gallbladder via percutaneous cholecystostomy per IR and continue abx therapy.  07/15: Pt arrived to special recovery for cholecystostomy drain placement and pt noted to be in atrial fibrillation with rvr heart rate 140 to 170's.  Cardiology notified pt received 5 mg of iv cardizem x1 dose/250 ml NS bolus/20 mg of iv lasix.  Procedure canceled and pt transferred to the stepdown unit for cardizem gtt.  PCCM team consulted  to assist with management.    09/29/22- patient in no distress, met with family today.  Awaiting percutaneous cholecystostomy and thoracentesis.  Testing for TLS today. 09/30/22- patient with emphysematous cholecystitis and pneumoperitoneum.  S/p throacentesis with partial clinical improvement.  Plan for IR consultation and cholecystostomy tube.    Anti-infectives (From admission, onward)    Start     Dose/Rate Route Frequency Ordered Stop   09/30/22 0000  piperacillin-tazobactam (ZOSYN) IVPB 3.375 g        3.375 g 12.5 mL/hr over 240 Minutes Intravenous Every 8 hours 09/29/22 2307     09/29/22 1800  Ampicillin-Sulbactam (UNASYN) 3 g in sodium chloride 0.9 % 100 mL IVPB  Status:  Discontinued        3 g 200 mL/hr over 30 Minutes Intravenous Every 8 hours 09/29/22 1425 09/29/22 2307   09/27/22 1000  Ampicillin-Sulbactam (UNASYN) 3 g in sodium chloride 0.9 % 100 mL IVPB  Status:  Discontinued        3 g 200 mL/hr over 30 Minutes Intravenous Every 6 hours 09/27/22 0844 09/29/22 1425       Interim History / Subjective:  Pt currently requiring neo-synephrine gtt @ 20 mcg/min to maintain map >65. Cardizem gtt not infusing at this time pt remains in atrial fibrillation heart rate 114 to 120's   Objective   Blood pressure 99/80, pulse (!) 112, temperature 98.1 F (36.7 C), temperature source Oral, resp. rate (!) 25, height 6' (1.829 m), weight 73.9 kg, SpO2 94%.        Intake/Output Summary (Last 24 hours) at 09/30/2022 6962 Last data filed at 09/30/2022 9528 Gross per 24 hour  Intake 2244.21 ml  Output 785 ml  Net 1459.21 ml   Filed Weights   09/26/22 0519  Weight: 73.9 kg    Examination: General: Acute on chronically-ill appearing male, NAD on 6L HFNC  HENT: Supple, no JVD  Lungs: Diminished throughout, even, non labored  Cardiovascular: Irregular irregular, no r/g, 2+ radial/1+ distal pulses, no edema   Abdomen: Hypoactive BS x4, mild abdominal tenderness with distention, taut  Extremities: Normal bulk and  tone, moves all extremities  Neuro: Alert and oriented, following commands, PERRLA GU: Indwelling foley catheter in place   Resolved Hospital Problem list     Assessment & Plan:   #Acute hypoxic respiratory failure secondary secondary to bilateral pleural effusions and atelectasis  - Supplemental O2 for dyspnea and/or hypoxia  - Maintain O2 sats 92% or higher  - Prn bronchodilator therapy  - Aggressive pulmonary hygiene - Intermittent CXR and ABG's  - Consider thoracentesis  -s/p throacentesis- 900cc removed  -atelelctasis noted - IS and metaneb ordered , discussed with RT today   Acute Kidney Injury -KDIGO 3   - multifactorial including ischemic/metabolic as well as possible Tumor lysis syndrome due to presence of AKI, hyperphosphatemia, arrythmias, chest discomfort.  Will obtain uric acid, LDH, G6PD in consideration for treatment with rasburicase.  -consider nephrology consultation  -IVF   #Atrial fibrillation with rvr  #Hypotension secondary to antiarrhythmic medication  - Continuous telemetry monitoring - Will check troponin and BNP  - Cardiology consulted appreciate input: recommending if renal function normal can consider digoxin, however if pt has evidence of renal failure recommend amiodarone gtt due to cardizem gtt causing hypotension  - Echo pending  - Continue heparin gtt: dosing per pharmacy  - Prn neo-synephrine gtt to maintain map >65 - Hold outpatient antihypertensives   #Acalculous emphysematous cholecystitis  - Trend WBC and monitor  fever curve  - Continue  abx therapy as outlined above  - General surgery consulted: recommended decompression of the gallbladder via percutaneous cholecystostomy per IR and continue abx therapy.  Procedure canceled 07/15 due to pt developing atrial fibrillation with rvr  - Prn oxycodone/fentanyl/morphine for pain management  -zosyn-    #Primary metastatic adenocarcinoma of the pancreas  - Oncology consulted appreciate input    #Type I diabetes mellitus  - CBG's ac/hs - SSI   Best Practice (right click and "Reselect all SmartList Selections" daily)   Diet/type: clear liquids DVT prophylaxis: systemic heparin GI prophylaxis: N/A Lines: N/A Foley:  Yes, and it is still needed Code Status:  full code Last date of multidisciplinary goals of care discussion [N/A]  Labs   CBC: Recent Labs  Lab 09/26/22 0538 09/27/22 1421 09/28/22 1607 09/29/22 0201 09/30/22 0334  WBC 15.7* 21.3* 18.7* 20.7* 17.7*  HGB 13.4 14.7 12.9* 12.8* 12.4*  HCT 41.3 43.4 36.5* 37.3* 34.5*  MCV 86.0 82.0 79.5* 81.3 77.5*  PLT 337 341 327 333 288    Basic Metabolic Panel: Recent Labs  Lab 09/26/22 0655 09/28/22 1607 09/29/22 0949 09/30/22 0334  NA 133* 134* 134* 133*  K 3.4* 4.4 3.7 3.7  CL 98 96* 97* 98  CO2 25 23 20* 22  GLUCOSE 148* 157* 176* 138*  BUN 15 70* 85* 89*  CREATININE 0.97 2.90* 3.17* 3.05*  CALCIUM 7.4* 7.3* 7.0* 7.1*  MG  --   --  2.4 2.5*  PHOS  --   --  8.8* 7.7*   GFR: Estimated Creatinine Clearance: 21.9 mL/min (A) (by C-G formula based on SCr of 3.05 mg/dL (H)). Recent Labs  Lab 09/27/22 1421 09/28/22 1607 09/29/22 0201 09/30/22 0334  WBC 21.3* 18.7* 20.7* 17.7*    Liver Function Tests: Recent Labs  Lab 09/26/22 0655 09/29/22 0949 09/30/22 0334  AST 32 222* 424*  ALT 24 106* 174*  ALKPHOS 113 192* 277*  BILITOT 1.1 1.6* 1.6*  PROT 5.4* 5.6* 5.2*  ALBUMIN 2.2* 2.0* 1.9*   Recent Labs  Lab 09/26/22 0655  LIPASE 19   No results for input(s): "AMMONIA" in the last 168 hours.  ABG    Component Value Date/Time   HCO3 27.9 03/26/2022 2105   O2SAT 22.9 03/26/2022 2105     Coagulation Profile: Recent Labs  Lab 09/27/22 1421 09/28/22 0916 09/29/22 0949 09/30/22 0334  INR 1.7* 1.7* 2.0* 2.0*    Cardiac Enzymes: No results for input(s): "CKTOTAL", "CKMB", "CKMBINDEX", "TROPONINI" in the last 168 hours.  HbA1C: Hgb A1c MFr Bld  Date/Time Value Ref Range Status   09/26/2022 12:54 PM 7.0 (H) 4.8 - 5.6 % Final    Comment:    (NOTE)         Prediabetes: 5.7 - 6.4         Diabetes: >6.4         Glycemic control for adults with diabetes: <7.0   03/27/2022 05:49 AM 11.8 (H) 4.8 - 5.6 % Final    Comment:    (NOTE) Pre diabetes:          5.7%-6.4%  Diabetes:              >6.4%  Glycemic control for   <7.0% adults with diabetes     CBG: Recent Labs  Lab 09/29/22 1803 09/29/22 1911 09/29/22 2352 09/30/22 0343 09/30/22 0744  GLUCAP 153* 155* 145* 129* 138*    Review of Systems:   Gen: Denies fever, chills,  weight change, fatigue, night sweats HEENT: Denies blurred vision, double vision, hearing loss, tinnitus, sinus congestion, rhinorrhea, sore throat, neck stiffness, dysphagia PULM: Denies shortness of breath, cough, sputum production, hemoptysis, wheezing CV: Denies chest pain, edema, orthopnea, paroxysmal nocturnal dyspnea, palpitations GI: abdominal pain, nausea, vomiting, diarrhea, hematochezia, melena, constipation, change in bowel habits GU: Denies dysuria, hematuria, polyuria, oliguria, urethral discharge Endocrine: Denies hot or cold intolerance, polyuria, polyphagia or appetite change Derm: Denies rash, dry skin, scaling or peeling skin change Heme: Denies easy bruising, bleeding, bleeding gums Neuro: Denies headache, numbness, weakness, slurred speech, loss of memory or consciousness   Past Medical History:  He,  has a past medical history of Atrial fibrillation (HCC), Cancer (HCC), Diabetes mellitus without complication (HCC), Hypercholesteremia, and Hypertension.   Surgical History:   Past Surgical History:  Procedure Laterality Date   KNEE SURGERY     REPLACEMENT TOTAL KNEE Right    SHOULDER SURGERY       Social History:   reports that he has never smoked. He has never used smokeless tobacco. He reports that he does not currently use alcohol. He reports that he does not use drugs.   Family History:  His family  history is not on file.   Allergies No Known Allergies   Home Medications  Prior to Admission medications   Medication Sig Start Date End Date Taking? Authorizing Provider  amLODipine (NORVASC) 10 MG tablet Take 10 mg by mouth daily. 03/20/21  Yes [provider]  atorvastatin (LIPITOR) 40 MG tablet Take 40 mg by mouth every morning. 03/20/21  Yes [provider]  ELIQUIS 5 MG TABS tablet Take 5 mg by mouth 2 (two) times daily. 03/20/21  Yes [provider]  insulin glargine (LANTUS) 100 UNIT/ML injection Inject 18 Units into the skin daily.   Yes [provider]  lisinopril (ZESTRIL) 5 MG tablet Take 5 mg by mouth every morning. 03/02/21  Yes [provider]  oxyCODONE (OXY IR/ROXICODONE) 5 MG immediate release tablet Take 5 mg by mouth every 6 (six) hours as needed for severe pain.   Yes [provider]  blood glucose meter kit and supplies KIT Dispense based on patient and insurance preference. Use up to four times daily as directed. 03/27/22   Sherryll Burger, Pratik D, DO  Insulin Pen Needle (PEN NEEDLES 3/16") 31G X 5 MM MISC Take as directed with insulin pen. 03/27/22   Maurilio Lovely D, DO     Critical care provider statement:   Total critical care time: 33 minutes   Performed by: Karna Christmas MD   Critical care time was exclusive of separately billable procedures and treating other patients.   Critical care was necessary to treat or prevent imminent or life-threatening deterioration.   Critical care was time spent personally by me on the following activities: development of treatment plan with patient and/or surrogate as well as nursing, discussions with consultants, evaluation of patient's response to treatment, examination of patient, obtaining history from patient or surrogate, ordering and performing treatments and interventions, ordering and review of laboratory studies, ordering and review of radiographic studies, pulse oximetry and  re-evaluation of patient's condition.    Vida Rigger, M.D.  Pulmonary & Critical Care Medicine

## 2022-09-30 NOTE — Procedures (Signed)
Interventional Radiology Procedure Note  Procedure:  1) CT guided percutaneous cholecystostomy tube placement 2) CT guided perihepatic fluid collection drain placement  Findings: Please refer to procedural dictation for full description. 10 Fr perc chole yielding approximately 100 mL sanguino-bilio-purulent drainage, to bulb suction.  Sample sent for culture.  10 Fr perc perihepatic fluid collection drain placed yielding approximately 500 mL of the same fluid as from within the gallbladder, to bulb suction.  Complications: None immediate  Estimated Blood Loss: < 5 mL  Recommendations: Keep to bulb suction for now, IR will follow. Follow up fluid culture.   Marliss Coots, MD

## 2022-09-30 NOTE — Progress Notes (Signed)
[facility]  ROUNDING NOTE   Subjective:   Wife and son at bedside.  Plan for gallbladder drain.   Creatinine 3.05 (3.17)  UOP .   Objective:  Vital signs in last 24 hours:  Temp:  [97.3 F (36.3 C)-98.7 F (37.1 C)] 97.8 F (36.6 C) (07/17 0815) Pulse Rate:  [93-122] 103 (07/17 1030) Resp:  [12-30] 12 (07/17 1030) BP: (84-112)/(60-86) 90/65 (07/17 1030) SpO2:  [72 %-100 %] 96 % (07/17 1052)  Weight change:  Filed Weights   09/26/22 0519  Weight: 73.9 kg    Intake/Output: I/O last 3 completed shifts: In: 2675.8 [I.V.:2226; IV Piggyback:449.8] Out: 940 [Urine:940]   Intake/Output this shift:  Total I/O In: 332.4 [P.O.:50; I.V.:232.5; IV Piggyback:49.9] Out: 135 [Urine:135]  Physical Exam: General: NAD, cachectic, laying in bed  Head: Normocephalic, atraumatic. Moist oral mucosal membranes  Eyes: Anicteric, PERRL  Neck: Supple, trachea midline  Lungs:  Clear to auscultation  Heart: Regular rate and rhythm  Abdomen:  Soft, +epigastric tenderness  Extremities:  no peripheral edema.  Neurologic: Nonfocal, moving all four extremities  Skin: No lesions  Access: none    Basic Metabolic Panel: Recent Labs  Lab 09/26/22 0655 09/28/22 1607 09/29/22 0949 09/30/22 0334  NA 133* 134* 134* 133*  K 3.4* 4.4 3.7 3.7  CL 98 96* 97* 98  CO2 25 23 20* 22  GLUCOSE 148* 157* 176* 138*  BUN 15 70* 85* 89*  CREATININE 0.97 2.90* 3.17* 3.05*  CALCIUM 7.4* 7.3* 7.0* 7.1*  MG  --   --  2.4 2.5*  PHOS  --   --  8.8* 7.7*    Liver Function Tests: Recent Labs  Lab 09/26/22 0655 09/29/22 0949 09/30/22 0334  AST 32 222* 424*  ALT 24 106* 174*  ALKPHOS 113 192* 277*  BILITOT 1.1 1.6* 1.6*  PROT 5.4* 5.6* 5.2*  ALBUMIN 2.2* 2.0* 1.9*   Recent Labs  Lab 09/26/22 0655  LIPASE 19   No results for input(s): "AMMONIA" in the last 168 hours.  CBC: Recent Labs  Lab 09/26/22 0538 09/27/22 1421 09/28/22 1607 09/29/22 0201 09/30/22 0334  WBC  15.7* 21.3* 18.7* 20.7* 17.7*  HGB 13.4 14.7 12.9* 12.8* 12.4*  HCT 41.3 43.4 36.5* 37.3* 34.5*  MCV 86.0 82.0 79.5* 81.3 77.5*  PLT 337 341 327 333 288    Cardiac Enzymes: No results for input(s): "CKTOTAL", "CKMB", "CKMBINDEX", "TROPONINI" in the last 168 hours.  BNP: Invalid input(s): "POCBNP"  CBG: Recent Labs  Lab 09/29/22 1803 09/29/22 1911 09/29/22 2352 09/30/22 0343 09/30/22 0744  GLUCAP 153* 155* 145* 129* 138*    Microbiology: Results for orders placed or performed during the hospital encounter of 09/26/22  MRSA Next Gen by PCR, Nasal     Status: None   Collection Time: 09/28/22  4:12 PM   Specimen: Nasal Mucosa; Nasal Swab  Result Value Ref Range Status   MRSA by PCR Next Gen NOT DETECTED NOT DETECTED Final    Comment: (NOTE) The GeneXpert MRSA Assay (FDA approved for NASAL specimens only), is one component of a comprehensive MRSA colonization surveillance program. It is not intended to diagnose MRSA infection nor to guide or monitor treatment for MRSA infections. Test performance is not FDA approved in patients less than 79 years old. Performed at [facility], [address] [place], [place] [address]     Coagulation Studies: Recent Labs    09/27/22 1421 09/28/22 0916 09/29/22 0949 09/30/22 0334  LABPROT 20.5* 20.0* 22.5*  23.2*  INR 1.7* 1.7* 2.0* 2.0*    Urinalysis: Recent Labs    09/29/22 1205  COLORURINE YELLOW*  LABSPEC 1.019  PHURINE 5.0  GLUCOSEU NEGATIVE  HGBUR SMALL*  BILIRUBINUR NEGATIVE  KETONESUR NEGATIVE  PROTEINUR 30*  NITRITE NEGATIVE  LEUKOCYTESUR NEGATIVE      Imaging: CT ABDOMEN PELVIS WO CONTRAST  Addendum Date: 09/29/2022   ADDENDUM REPORT: 09/29/2022 23:18 ADDENDUM: These results were called by telephone at the time of interpretation on 09/29/2022 at 11:14 pm to provider Dr. Everlene Farrier, who verbally acknowledged these results. Electronically Signed   By: Tish Frederickson M.D.   On: 09/29/2022 23:18    Result Date: 09/29/2022 CLINICAL DATA:  Bowel obstruction suspected EXAM: CT ABDOMEN AND PELVIS WITHOUT CONTRAST TECHNIQUE: Multidetector CT imaging of the abdomen and pelvis was performed following the standard protocol without IV contrast. RADIATION DOSE REDUCTION: This exam was performed according to the departmental dose-optimization program which includes automated exposure control, adjustment of the mA and/or kV according to patient size and/or use of iterative reconstruction technique. COMPARISON:  CT abdomen pelvis 09/26/2022 FINDINGS: Lower chest: Partially visualized bilateral small pleural effusions with bilateral lower lobe passive atelectasis. Hepatobiliary: Redemonstration of a couple of hepatic lesions consistent with known metastases. Air-fluid level within the gastric lumen. Air noted within the wall of the gallbladder. Common bile duct stent in appropriate position with associated pneumobilia. Pancreas: Proximal pancreatic mass again noted in difficult to delineate on this study. Finding measures approximately 12.6 x 4.7 cm extending along the liver and duodenum. No surrounding inflammatory changes. No main pancreatic ductal dilatation. Spleen: Normal in size without focal abnormality. Adrenals/Urinary Tract: No adrenal nodule bilaterally. No nephrolithiasis and no hydronephrosis. Several fluid density lesions likely represent simple renal cysts. Simple renal cysts, in the absence of clinically indicated signs/symptoms, require no independent follow-up. No ureterolithiasis or hydroureter. The urinary bladder is decompressed with Foley catheter tip and balloon terminating within the lumen. Stomach/Bowel: Stomach distended with air-fluid level. Bowel thickening of the first portion of the duodenum suggestive of possible metastatic invasion. No evidence of large bowel wall thickening or dilatation. Colonic diverticulosis. Appendix appears normal. Vascular/Lymphatic: No abdominal aorta or iliac  aneurysm. Moderate atherosclerotic plaque of the aorta and its branches. No abdominal, pelvic, or inguinal lymphadenopathy. Reproductive: Prostate is unremarkable. Other: Interval increase in trace to small volume simple free fluid within the right upper quadrant. Associated free gas. No intraperitoneal free gas. No organized fluid collection. Musculoskeletal: Bilateral inguinal hernias containing fat and fluid. Subcutaneus soft tissue edema. No suspicious lytic or blastic osseous lesions. No acute displaced fracture. Multilevel degenerative changes of the spine. IMPRESSION: 1. Acute emphysematous cholecystitis. Recommend emergent surgical consultation. 2. Interval development of pneumoperitoneum. 3. Interval increase in small volume perihepatic ascites. 4. Gastric lumen distended with air-fluid level. Developing obstruction along the first portion of duodenum in the region of the pancreatic malignancy not excluded. 5. Metastatic pancreatic malignancy poorly evaluated on this noncontrast study. Please see CT abdomen pelvis 09/26/2022 for further details. 6. Partially visualized bilateral small pleural effusions with bilateral lower lobe passive atelectasis. 7.  Aortic Atherosclerosis (ICD10-I70.0). These results were called by telephone at the time of interpretation on 09/29/2022 at 10:37 pm to provider Dr. Karna Christmas, who verbally acknowledged these results. Electronically Signed: By: Tish Frederickson M.D. On: 09/29/2022 22:44   US THORACENTESIS ASP PLEURAL SPACE W/IMG GUIDE  Result Date: 09/29/2022 INDICATION: Right pleural effusion.  Pancreatic cancer. EXAM: ULTRASOUND GUIDED RIGHT THORACENTESIS MEDICATIONS: None. COMPLICATIONS: None immediate. PROCEDURE: An ultrasound  guided thoracentesis was thoroughly discussed with the patient and questions answered. The benefits, risks, alternatives and complications were also discussed. The patient understands and wishes to proceed with the procedure. Written consent was  obtained. Ultrasound was performed to localize and mark an adequate pocket of fluid in the right chest. The area was then prepped and draped in the normal sterile fashion. 1% Lidocaine was used for local anesthesia. Under ultrasound guidance a 6 Fr Safe-T-Centesis catheter was introduced. Thoracentesis was performed. The catheter was removed and a dressing applied. FINDINGS: A total of approximately 800 mL of dark yellow fluid was removed. Samples were sent to the laboratory as requested by the clinical team. Images were also obtained of the right upper abdomen to evaluate the gallbladder. Loculated perihepatic ascites is identified. Previously identified gallbladder is poorly characterized because there appears to be air within the gallbladder. The amount of fluid around the liver has significantly increased since the right upper quadrant ultrasound 06/27/2022. IMPRESSION: 1. Successful ultrasound guided right thoracentesis yielding 800 mL of pleural fluid. 2. Images of the right upper quadrant demonstrate markedly increased perihepatic ascites. Ascites appears to be complex. In addition, the gallbladder now appears to contain a large amount of gas which is likely related to the biliary stent and pneumobilia. Electronically Signed   By: Richarda Overlie M.D.   On: 09/29/2022 15:52   DG Chest Port 1 View  Result Date: 09/29/2022 CLINICAL DATA:  Pleural effusion.  Status post thoracentesis. EXAM: PORTABLE CHEST 1 VIEW COMPARISON:  Chest radiographs 09/28/2022 and 09/26/2022. Abdominal CT 09/26/2022. FINDINGS: 1417 hours. Interval decreased volume of right pleural effusion. A left pleural effusion and left basilar airspace disease appear unchanged. No pneumothorax. The heart size and mediastinal contours are stable. Moderate gaseous distension of the stomach noted. Curvilinear lucency in the right upper quadrant corresponds with air in the gallbladder lumen on recent CT. Patient has a biliary stent. IMPRESSION: 1.  Decreased right pleural effusion following thoracentesis. No pneumothorax. 2. No significant change in left pleural effusion and left basilar airspace disease. Electronically Signed   By: Carey Bullocks M.D.   On: 09/29/2022 15:46   ECHOCARDIOGRAM COMPLETE  Result Date: 09/29/2022    ECHOCARDIOGRAM REPORT   Patient Name:   Cameron Gutierrez Date of Exam: 09/29/2022 Medical Rec #:  [id number]       Height:       72.0 in Accession #:    [id number]      Weight:       163.0 lb Date of Birth:  03/21/1945       BSA:          1.953 m Patient Age:    76 years        BP:           112/74 mmHg Patient Gender: M               HR:           115 bpm. Exam Location:  [facility] Procedure: 2D Echo, Cardiac Doppler and Color Doppler Indications:     Atrial Fibrillation I48.91  History:         Patient has no prior history of Echocardiogram examinations.                  Arrythmias:Atrial Fibrillation; Risk Factors:Diabetes and                  Hypertension.  Sonographer:     Cristela Blue Referring Phys:  [id number] SONA PATEL Diagnosing Phys: Cristal Deer End MD IMPRESSIONS  1. Left ventricular ejection fraction, by estimation, is 60 to 65%. The left ventricle has normal function. The left ventricle has no regional wall motion abnormalities. There is mild asymmetric left ventricular hypertrophy of the basal-septal segment. Left ventricular diastolic function could not be evaluated.  2. Pulmonary artery pressure is mildly elevated (RVSP 30-35 mmHg plus central venous/right atrial pressure). Right ventricular systolic function is normal. The right ventricular size is normal.  3. Moderate pleural effusion in the right lateral region.  4. The mitral valve is normal in structure. Mild to moderate mitral valve regurgitation.  5. Tricuspid valve regurgitation is mild to moderate.  6. The aortic valve is tricuspid. Aortic valve regurgitation is mild. No aortic stenosis is present. FINDINGS  Left Ventricle: Left ventricular ejection fraction, by  estimation, is 60 to 65%. The left ventricle has normal function. The left ventricle has no regional wall motion abnormalities. The left ventricular internal cavity size was normal in size. There is  mild asymmetric left ventricular hypertrophy of the basal-septal segment. Left ventricular diastolic function could not be evaluated due to atrial fibrillation. Left ventricular diastolic function could not be evaluated. Right Ventricle: Pulmonary artery pressure is mildly elevated (RVSP 30-35 mmHg plus central venous/right atrial pressure). The right ventricular size is normal. No increase in right ventricular wall thickness. Right ventricular systolic function is normal. Left Atrium: Left atrial size was normal in size. Right Atrium: Right atrial size was normal in size. Pericardium: The pericardium was not well visualized. Mitral Valve: The mitral valve is normal in structure. Mild to moderate mitral valve regurgitation. Tricuspid Valve: The tricuspid valve is normal in structure. Tricuspid valve regurgitation is mild to moderate. Aortic Valve: The aortic valve is tricuspid. Aortic valve regurgitation is mild. No aortic stenosis is present. Aortic valve mean gradient measures 3.0 mmHg. Aortic valve peak gradient measures 4.7 mmHg. Aortic valve area, by VTI measures 3.27 cm. Pulmonic Valve: The pulmonic valve was grossly normal. Pulmonic valve regurgitation is trivial. No evidence of pulmonic stenosis. Aorta: The aortic root is normal in size and structure. Pulmonary Artery: The pulmonary artery is not well seen. Venous: The inferior vena cava was not well visualized. IAS/Shunts: The interatrial septum was not well visualized. Additional Comments: There is a moderate pleural effusion in the right lateral region.  LEFT VENTRICLE PLAX 2D LVIDd:         3.90 cm LVIDs:         2.60 cm LV PW:         0.90 cm LV IVS:        1.20 cm LVOT diam:     2.20 cm LV SV:         47 LV SV Index:   24 LVOT Area:     3.80 cm  RIGHT  VENTRICLE RV Basal diam:  3.50 cm RV Mid diam:    2.80 cm LEFT ATRIUM             Index        RIGHT ATRIUM           Index LA diam:        4.30 cm 2.20 cm/m   RA Area:     16.20 cm LA Vol (A2C):   70.1 ml 35.89 ml/m  RA Volume:   34.00 ml  17.41 ml/m LA Vol (A4C):   47.7 ml 24.42 ml/m LA Biplane Vol: 60.7 ml 31.08 ml/m  AORTIC VALVE  AV Area (Vmax):    2.81 cm AV Area (Vmean):   2.73 cm AV Area (VTI):     3.27 cm AV Vmax:           108.00 cm/s AV Vmean:          74.350 cm/s AV VTI:            0.143 m AV Peak Grad:      4.7 mmHg AV Mean Grad:      3.0 mmHg LVOT Vmax:         79.80 cm/s LVOT Vmean:        53.400 cm/s LVOT VTI:          0.123 m LVOT/AV VTI ratio: 0.86  AORTA Ao Root diam: 3.66 cm MITRAL VALVE               TRICUSPID VALVE MV Area (PHT): 3.63 cm    TR Peak grad:   32.9 mmHg MV Decel Time: 209 msec    TR Vmax:        287.00 cm/s MV E velocity: 86.50 cm/s                            SHUNTS                            Systemic VTI:  0.12 m                            Systemic Diam: 2.20 cm Yvonne Kendall MD Electronically signed by Yvonne Kendall MD Signature Date/Time: 09/29/2022/2:31:41 PM    Final    US RENAL  Result Date: 09/29/2022 CLINICAL DATA:  Acute renal failure. EXAM: RENAL / URINARY TRACT ULTRASOUND COMPLETE COMPARISON:  September 26, 2022. FINDINGS: Right Kidney: Renal measurements: 9.6 x 5.1 x 4.7 cm = volume: 122 mL. At least 2 parapelvic cysts are noted, the largest measuring 2.3 cm. Increased echogenicity of renal parenchyma is noted. No mass or hydronephrosis visualized. Left Kidney: Renal measurements: 10.1 x 5.9 x 5.5 cm = volume: 170 mL. Multi septated cyst measuring 2.3 x 2.3 x 1.9 cm is noted. 2.5 cm simple cyst is noted. Increased echogenicity of renal parenchyma is noted. No hydronephrosis visualized. Bladder: Appears normal for degree of bladder distention. Other: None. IMPRESSION: 2.3 cm multi-septated complex cyst seen in left kidney. Follow-up ultrasound in 6-12 months  is recommended to ensure stability. Increased echogenicity of renal parenchyma is noted bilaterally suggesting medical renal disease. No hydronephrosis or renal obstruction is noted. Electronically Signed   By: Lupita Raider M.D.   On: 09/29/2022 13:28   DG Chest Port 1 View  Result Date: 09/28/2022 CLINICAL DATA:  Shortness of breath. EXAM: PORTABLE CHEST 1 VIEW COMPARISON:  September 26, 2022 FINDINGS: Calcific atherosclerotic disease of the aorta. Cardiomediastinal silhouette is enlarged. Mediastinal contours appear intact. Bilateral pleural effusions, right greater than left. No focal airspace consolidation. Osseous structures are without acute abnormality. Soft tissues are grossly normal. IMPRESSION: 1. Bilateral pleural effusions, right greater than left. 2. Enlarged cardiomediastinal silhouette. Electronically Signed   By: Ted Mcalpine M.D.   On: 09/28/2022 15:37     Medications:    sodium chloride Stopped (09/29/22 1140)   sodium chloride 50 mL/hr at 09/30/22 1033   amiodarone 30 mg/hr (09/30/22 1033)   heparin Stopped (09/30/22 0910)   phenylephrine (  NEO-SYNEPHRINE) Adult infusion 30 mcg/min (09/30/22 1033)   piperacillin-tazobactam (ZOSYN)  IV Stopped (09/30/22 1013)   promethazine (PHENERGAN) injection (IM or IVPB)      atorvastatin  40 mg Oral q morning   calcium acetate  1,334 mg Oral TID WC   Chlorhexidine Gluconate Cloth  6 each Topical QHS   docusate sodium  100 mg Oral BID   feeding supplement  237 mL Oral TID BM   insulin aspart  0-9 Units Subcutaneous Q4H   multivitamin with minerals  1 tablet Oral Daily   potassium chloride  40 mEq Oral Once   fentaNYL (SUBLIMAZE) injection, iohexol, levalbuterol, metoprolol tartrate, ondansetron **OR** ondansetron (ZOFRAN) IV, oxyCODONE, polyethylene glycol, promethazine (PHENERGAN) injection (IM or IVPB), traMADol, traZODone  Assessment/ Plan:  Mr. KIPPY GOHMAN is a 77 y.o.  male  with atrial fibrillation, pancreatic cancer,  diabetes mellitus type II, hyperlipidemia, hypertension, who was admitted to [facility] on 09/26/2022 for Generalized abdominal pain [R10.84] Adenocarcinoma of pancreas (HCC) [C25.9] Primary pancreatic cancer with metastasis to other site [facility]) [C25.9]  Nephrology consulted for acute kidney injury.    Acute Kidney Injury: baseline creatinine of 0.97 with GFR >60 on admission, 7/13. VI contrast exposure on 7/13. Suspect IV contrast induced nephropathy plus prerenal azotemia. - holding home lisinopril - Continue IV fluids: start NS at 68mL/hr   Hypotension: placed on phenylephrine gtt.  - holding home regimen of amlodipine and lisinopril.    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 history of glycosuria.  Hemoglobin A1c of 7% on admission.  - continue glucose control.    LOS: 3 [place] Lust 7/17/202411:10 AM

## 2022-09-30 NOTE — Plan of Care (Signed)
  Problem: Coping: Goal: Ability to adjust to condition or change in health will improve Outcome: Progressing   Problem: Metabolic: Goal: Ability to maintain appropriate glucose levels will improve Outcome: Progressing   Problem: Skin Integrity: Goal: Risk for impaired skin integrity will decrease Outcome: Progressing   Problem: Tissue Perfusion: Goal: Adequacy of tissue perfusion will improve Outcome: Progressing   

## 2022-09-30 NOTE — Progress Notes (Signed)
2000 - Pt resting in bed in NAD with family at bedside. Continues on Heparin, Amiodarone, Phenylephrine, and NS gtts per MAR. Pt denies pain at this time. Pt reports severely dry mouth. Oral care provided and moisturizer applied. Drain dressing clean, dry, and intact and thin brown drainage noted in bulb suction. Call bell within reach. Pt voiced no further needs at this time.   2130 - Pt offered CHG bath. Pt declined at this time. Pt continues to rest comfortably. Pt's wife remains at bedside. Call bell within reach. No further needs at this time.

## 2022-09-30 NOTE — IPAL (Signed)
  Interdisciplinary Goals of Care Family Meeting   Date carried out: 09/30/2022  Location of the meeting: Bedside  Member's involved: Nurse Practitioner, Bedside Registered Nurse, and Family Member or next of kin  Durable Power of Attorney or acting medical decision maker: Patient and spouse, Cameron Gutierrez    Discussion: We discussed goals of care for [name]. Patient and spouse called provider bedside after thinking over CT imaging results and previous conversation. The patient is in multiorgan failure with metastatic pancreatic adenocarcinoma and newly discovered acute emphysematous cholecystitis.As the patient is not a surgical candidate, his prognosis is grave. Patient would like to change CODE STATUS to DNR/DNI. They would like to discuss options for comfort and hospice later today.  Code status:   Code Status: DNR   Disposition: Continue current acute care  Time spent for the meeting: 20 minutes    Cecelia Byars Rust-Chester, NP  09/30/2022, 3:30 AM  Cheryll Cockayne Rust-Chester, AGACNP-BC Acute Care Nurse Practitioner [facility]   [phone] / [phone] Please see Amion for details.

## 2022-09-30 NOTE — H&P (Signed)
Chief Complaint: Patient was seen in consultation today for acute cholecystitis  Referring Physician(s): Dr. Tim Lair  History of Present Illness: Cameron Gutierrez is a 77 y.o. male with recently diagnosed advanced stage pancreatic cancer complicated by common bile duct stenosis status post ERCP with covered common bile duct stent placement on 09/23/22 at [facility].  He presented to the [facility] ED 2 days later with abdominal pain and dyspnea.  Imaging has shown progressive cholecystitis, now with possible emphysematous changes and perforation with perihepatic fluid.  With this, he has progressed in septic shock and multiorgan failure.  He underwent thoracentesis yesterday.  His main complaint currently is that he can't swallow and feels distended in his abdomen.  Multiple goals of care conversations are ongoing.  Past Medical History:  Diagnosis Date   Atrial fibrillation (HCC)    Cancer (HCC)    pancreatic   Diabetes mellitus without complication (HCC)    Hypercholesteremia    Hypertension     Past Surgical History:  Procedure Laterality Date   KNEE SURGERY     REPLACEMENT TOTAL KNEE Right    SHOULDER SURGERY      Allergies: Patient has no known allergies.  Medications: Prior to Admission medications   Medication Sig Start Date End Date Taking? Authorizing Provider  amLODipine (NORVASC) 10 MG tablet Take 10 mg by mouth daily. 03/20/21  Yes [provider]  atorvastatin (LIPITOR) 40 MG tablet Take 40 mg by mouth every morning. 03/20/21  Yes [provider]  ELIQUIS 5 MG TABS tablet Take 5 mg by mouth 2 (two) times daily. 03/20/21  Yes [provider]  insulin glargine (LANTUS) 100 UNIT/ML injection Inject 18 Units into the skin daily.   Yes [provider]  lisinopril (ZESTRIL) 5 MG tablet Take 5 mg by mouth every morning. 03/02/21  Yes [provider]  oxyCODONE (OXY IR/ROXICODONE) 5 MG immediate release tablet Take 5 mg by mouth every 6 (six)  hours as needed for severe pain.   Yes [provider]  blood glucose meter kit and supplies KIT Dispense based on patient and insurance preference. Use up to four times daily as directed. 03/27/22   Sherryll Burger, Pratik D, DO  Insulin Pen Needle (PEN NEEDLES 3/16") 31G X 5 MM MISC Take as directed with insulin pen. 03/27/22   Maurilio Lovely D, DO     History reviewed. No pertinent family history.  Social History   Socioeconomic History   Marital status: Married    Spouse name: Not on file   Number of children: Not on file   Years of education: Not on file   Highest education level: Not on file  Occupational History   Not on file  Tobacco Use   Smoking status: Never   Smokeless tobacco: Never  Substance and Sexual Activity   Alcohol use: Not Currently   Drug use: Never   Sexual activity: Not on file  Other Topics Concern   Not on file  Social History Narrative   ** Merged History Encounter **       Social Determinants of Health   Financial Resource Strain: Low Risk  (09/21/2022)   Received from [facility]   Overall Financial Resource Strain (CARDIA)    Difficulty of Paying Living Expenses: Not very hard  Food Insecurity: No Food Insecurity (09/26/2022)   Hunger Vital Sign    Worried About Running Out of Food in the Last Year: Never true    Ran Out of Food in  the Last Year: Never true  Transportation Needs: No Transportation Needs (09/26/2022)   PRAPARE - Administrator, Civil Service (Medical): No    Lack of Transportation (Non-Medical): No  Physical Activity: Not on file  Stress: No Stress Concern Present (09/21/2022)   Received from [facility] - Occupational Stress Questionnaire    Feeling of Stress : Not at all  Social Connections: Not on file    Review of Systems: A 12 point ROS discussed and pertinent positives are indicated in the HPI above.  All other systems are negative.  Vital Signs: BP 91/68    Pulse (!) 106   Temp 98.1 F (36.7 C) (Oral)   Resp (!) 21   Ht 6' (1.829 m)   Wt 73.9 kg   SpO2 99%   BMI 22.11 kg/m   Physical Exam Constitutional:      Appearance: He is ill-appearing.  HENT:     Head: Normocephalic.     Mouth/Throat:     Mouth: Mucous membranes are dry.  Eyes:     General: No scleral icterus. Cardiovascular:     Rate and Rhythm: Tachycardia present.  Pulmonary:     Effort: Respiratory distress present.  Abdominal:     General: There is distension.     Tenderness: There is abdominal tenderness.  Musculoskeletal:     Right lower leg: No edema.     Left lower leg: No edema.  Skin:    General: Skin is warm and dry.  Neurological:     Mental Status: He is oriented to person, place, and time.     Imaging: ERCP 09/23/22    CT AP 09/29/22   Labs:  CBC: Recent Labs    09/27/22 1421 09/28/22 1607 09/29/22 0201 09/30/22 0334  WBC 21.3* 18.7* 20.7* 17.7*  HGB 14.7 12.9* 12.8* 12.4*  HCT 43.4 36.5* 37.3* 34.5*  PLT 341 327 333 288    COAGS: Recent Labs    09/27/22 1421 09/28/22 0916 09/28/22 1607 09/29/22 0201 09/29/22 0949 09/29/22 2246 09/30/22 0334  INR 1.7* 1.7*  --   --  2.0*  --  2.0*  APTT  --   --  39* 109* 100* 128*  --     BMP: Recent Labs    09/26/22 0655 09/28/22 1607 09/29/22 0949 09/30/22 0334  NA 133* 134* 134* 133*  K 3.4* 4.4 3.7 3.7  CL 98 96* 97* 98  CO2 25 23 20* 22  GLUCOSE 148* 157* 176* 138*  BUN 15 70* 85* 89*  CALCIUM 7.4* 7.3* 7.0* 7.1*  CREATININE 0.97 2.90* 3.17* 3.05*  GFRNONAA >60 22* 20* 21*    LIVER FUNCTION TESTS: Recent Labs    03/27/22 0549 09/26/22 0655 09/29/22 0949 09/30/22 0334  BILITOT 0.8 1.1 1.6* 1.6*  AST 19 32 222* 424*  ALT 20 24 106* 174*  ALKPHOS 67 113 192* 277*  PROT 6.0* 5.4* 5.6* 5.2*  ALBUMIN 3.3* 2.2* 2.0* 1.9*    TUMOR MARKERS: No results for input(s): "AFPTM", "CEA", "CA199", "CHROMGRNA" in the last 8760 hours.  Assessment and Plan: 77 year old  male with recently diagnosed advanced stage pancreatic cancer with biliary obstruction status post common bile duct stent placement at [facility] via endoscopic approach on 09/23/22.  Since this time, he has developed progressively worsened acute cholecystitis, now with possible emphysematous features and rupture.  I suspect this is secondary to cystic duct coverage by covered biliary stent.  Extensive conversation at bedside with patient, family, and Dr. Karna Christmas was had discussing overall prognosis and immediate situation regarding cholecystitis.  Mutual decision was made to proceed with CT guided percutaneous cholecystostomy and periphepatic drain placements with moderate sedation.  Risks and benefits discussed with the patient including bleeding, infection, damage to adjacent structures, bowel perforation/fistula connection, and sepsis.  Specifically, we discussed that this procedure is not low risk given elevated INR.  All of the patient's questions were answered, patient is agreeable to proceed.  Consent signed and in chart.  -ICU to place NG tube -hold heparin now, can resume after procedure if no signs of bleeding    Electronically Signed: Bennie Dallas, MD 09/30/2022, 9:14 AM   I spent a total of 40 Minutes  in face to face in clinical consultation, greater than 50% of which was counseling/coordinating care for acute cholecystitis.

## 2022-09-30 NOTE — Progress Notes (Signed)
Progress Note  Patient Name: Cameron Gutierrez Date of Encounter: 09/30/2022  Primary Cardiologist: New - consult by Kirke Corin  Subjective   He underwent right-sided thoracentesis yesterday with 800 mL.  No chest pain, dyspnea, dizziness, presyncope, syncope, or palpitations.  Remains in A-fib with overall improvement in ventricular rates in the 90s to low 100s bpm on amiodarone drip.  Remains on Neo-Synephrine.  Family at bedside.  Inpatient Medications    Scheduled Meds:  atorvastatin  40 mg Oral q morning   calcium acetate  1,334 mg Oral TID WC   Chlorhexidine Gluconate Cloth  6 each Topical QHS   docusate sodium  100 mg Oral BID   feeding supplement  237 mL Oral TID BM   insulin aspart  0-9 Units Subcutaneous Q4H   multivitamin with minerals  1 tablet Oral Daily   potassium chloride  40 mEq Oral Once   Continuous Infusions:  sodium chloride Stopped (09/29/22 1140)   sodium chloride Stopped (09/30/22 1610)   amiodarone 30 mg/hr (09/30/22 0922)   heparin Stopped (09/30/22 0910)   phenylephrine (NEO-SYNEPHRINE) Adult infusion 30 mcg/min (09/30/22 0922)   piperacillin-tazobactam (ZOSYN)  IV 12.5 mL/hr at 09/30/22 0922   promethazine (PHENERGAN) injection (IM or IVPB)     PRN Meds: fentaNYL (SUBLIMAZE) injection, iohexol, levalbuterol, metoprolol tartrate, ondansetron **OR** ondansetron (ZOFRAN) IV, oxyCODONE, polyethylene glycol, promethazine (PHENERGAN) injection (IM or IVPB), traMADol, traZODone   Vital Signs    Vitals:   09/30/22 0845 09/30/22 0900 09/30/22 0915 09/30/22 0930  BP: 96/68 91/68 99/80  97/64  Pulse: (!) 106 (!) 106 (!) 112 (!) 108  Resp: (!) 24 (!) 21 (!) 25 15  Temp:      TempSrc:      SpO2: 98% 99% 94% 97%  Weight:      Height:        Intake/Output Summary (Last 24 hours) at 09/30/2022 0949 Last data filed at 09/30/2022 9604 Gross per 24 hour  Intake 2244.21 ml  Output 785 ml  Net 1459.21 ml   Filed Weights   09/26/22 0519  Weight: 73.9 kg     Telemetry    A-fib with ventricular rates in the 90s to low 100s bpm - Personally Reviewed  ECG    No new tracings - Personally Reviewed  Physical Exam   GEN: No acute distress.  Chronically ill-appearing. Neck: No JVD. Cardiac: IRIR, no murmurs, rubs, or gallops.  Respiratory: Mildly diminished throughout.  GI: Soft, nontender, non-distended.   MS: No edema; No deformity. Neuro:  Alert and oriented x 3; Nonfocal.  Psych: Normal affect.  Labs    Chemistry Recent Labs  Lab 09/26/22 0655 09/28/22 1607 09/29/22 0949 09/30/22 0334  NA 133* 134* 134* 133*  K 3.4* 4.4 3.7 3.7  CL 98 96* 97* 98  CO2 25 23 20* 22  GLUCOSE 148* 157* 176* 138*  BUN 15 70* 85* 89*  CREATININE 0.97 2.90* 3.17* 3.05*  CALCIUM 7.4* 7.3* 7.0* 7.1*  PROT 5.4*  --  5.6* 5.2*  ALBUMIN 2.2*  --  2.0* 1.9*  AST 32  --  222* 424*  ALT 24  --  106* 174*  ALKPHOS 113  --  192* 277*  BILITOT 1.1  --  1.6* 1.6*  GFRNONAA >60 22* 20* 21*  ANIONGAP 10 15 17* 13     Hematology Recent Labs  Lab 09/28/22 1607 09/29/22 0201 09/30/22 0334  WBC 18.7* 20.7* 17.7*  RBC 4.59 4.59 4.45  HGB 12.9* 12.8* 12.4*  HCT 36.5* 37.3* 34.5*  MCV 79.5* 81.3 77.5*  MCH 28.1 27.9 27.9  MCHC 35.3 34.3 35.9  RDW 13.9 13.7 13.8  PLT 327 333 288    Cardiac EnzymesNo results for input(s): "TROPONINI" in the last 168 hours. No results for input(s): "TROPIPOC" in the last 168 hours.   BNP Recent Labs  Lab 09/28/22 1827  BNP 222.2*     DDimer  Recent Labs  Lab 09/29/22 1139  DDIMER 7.06*     Radiology    DG Chest Port 1 View  Result Date: 09/28/2022 IMPRESSION: 1. Bilateral pleural effusions, right greater than left. 2. Enlarged cardiomediastinal silhouette. Electronically Signed   By: Ted Mcalpine M.D.   On: 09/28/2022 15:37    Cardiac Studies   2D echo pending  Patient Profile     77 y.o. male with history of adult onset type 1 diabetes, hypertension, CKD stage II, hyperlipidemia,  atrial fibrillation of uncertain chronicity on chronic anticoagulation with apixaban, recently diagnosed metastatic pancreatic adenocarcinoma, who is being seen 09/29/2022 for the evaluation of atrial fibrillation RVR at the request of Dr. Allena Katz.   Assessment & Plan    Atrial fibrillation RVR of uncertain chronicity -Presented to the [facility] emergency department 7/13 with EKG revealing rate controlled atrial fibrillation -During procedure on 7/15, he went into A-fib RVR with rates of 116 bpm, procedure had to be canceled -Likely multifactorial from sepsis, cholecystitis, metastatic adenocarcinoma of the pancreas, hypotension, and multiorgan failure -No emergent indication for DCCV, would pursue rate control strategy -Echo with preserved LV systolic function and normal wall motion -Heparin infusion in place of apixaban, with plans to be transitioned back to [facility] prior to discharge for CHA2DS2-VASc score of at least 4 for stroke prophylaxis -Relative hypotension requiring vasopressor support and AKI has limited rate controlling options -While not ideal given interruption in anticoagulation, he has been maintained on amiodarone infusion for rate control, no other options at this time -Continue with cardiac monitoring -Recommend repletion of potassium to goal 4.0 per critical care protocol -Overall difficult situation with multiorgan failure and active dying      For questions or updates, please contact [facility] Please consult [email or web address] for contact info under Cardiology/STEMI.    Signed, Eula Listen, PA-C [facility] Pager: [phone] 09/30/2022, 9:49 AM

## 2022-09-30 NOTE — Progress Notes (Signed)
   09/30/22 1700  Spiritual Encounters  Type of Visit Initial  Care provided to: Family  Reason for visit Urgent spiritual support  OnCall Visit Yes  Spiritual Framework  Presenting Themes Other (comment) (Living Will)  Patient Stress Factors None identified  Family Stress Factors Lack of knowledge;Major life changes  Interventions  Spiritual Care Interventions Made Established relationship of care and support;Compassionate presence;Reflective listening  Spiritual Care Plan  Spiritual Care Issues Still Outstanding No further spiritual care needs at this time (see row info)   Family was looking for a Power of Attorney or a Will dealing with possession and property. Advise them that we are unable to do those type of power work. Advise them on what we are able to do and that we are here to support them. Family has a concern if they are able to get everything in order for Cameron Gutierrez. Told the family that Chaplain services are here 24/7

## 2022-09-30 NOTE — Progress Notes (Addendum)
[facility] SURGICAL PROGRESS NOTE (cpt [phone])  Hospital Day(s): 3.   Interval History:  Patient seen and examined.  Noted events and work up last night CT Abdomen/Pelvis concerning for emphysematous cholecystitis, pneumobilia, pneumoperitoneum Labs reviewed; concerning for MSOF Percutaneous cholecystostomy tube placement on hold for now Remains in ICU He is on vasopressor support; phenylephrine 30 mcg/min Remains tachycardic to 115 bpm this morning; currently on Amiodarone.  He denied any abdominal complaints or pain this morning He is resting with son and wife at bedside Breathing still appears labored Reviewed discussion overnight regarding disease process, recent work up, and overall prognosis   Review of Systems:  Constitutional: denies fever, chills  HEENT: denies cough or congestion  Respiratory: + SOB; denied cough  Cardiovascular: denies chest pain or palpitations  Gastrointestinal: denied abdominal pain; denied N/V Genitourinary: denies burning with urination or urinary frequency Musculoskeletal: denies pain, decreased motor or sensation  Vital signs in last 24 hours: [min-max] current  Temp:  [97.3 F (36.3 C)-98.7 F (37.1 C)] 98.1 F (36.7 C) (07/17 0000) Pulse Rate:  [93-132] 93 (07/17 0745) Resp:  [13-30] 18 (07/17 0800) BP: (84-123)/(60-86) 100/72 (07/17 0800) SpO2:  [72 %-99 %] 96 % (07/17 0745)     Height: 6' (182.9 cm) Weight: 73.9 kg BMI (Calculated): 22.1   Intake/Output last 2 shifts:  07/16 0701 - 07/17 0700 In: 2034.6 [I.V.:1784.9; IV Piggyback:249.7] Out: 725 [Urine:725]   Physical Exam:  Constitutional: alert, cooperative and no distress, family at bedside HENT: normocephalic without obvious abnormality  Eyes: PERRL, EOM's grossly intact and symmetric  Respiratory: Conversationally dyspneic, on HFNC Cardiovascular: tachycardic; irregularly irregular  Gastrointestinal: soft, I do not appreciate tenderness this AM, and  non-distended, no rebound/guarding Musculoskeletal: no edema or wounds, motor and sensation grossly intact, NT    Labs:     Latest Ref Rng & Units 09/30/2022    3:34 AM 09/29/2022    2:01 AM 09/28/2022    4:07 PM  CBC  WBC 4.0 - 10.5 K/uL 17.7  20.7  18.7   Hemoglobin 13.0 - 17.0 g/dL 21.3  08.6  57.8   Hematocrit 39.0 - 52.0 % 34.5  37.3  36.5   Platelets 150 - 400 K/uL 288  333  327       Latest Ref Rng & Units 09/30/2022    3:34 AM 09/29/2022    9:49 AM 09/28/2022    4:07 PM  CMP  Glucose 70 - 99 mg/dL 469  629  528   BUN 8 - 23 mg/dL 89  85  70   Creatinine 0.61 - 1.24 mg/dL 4.13  2.44  0.10   Sodium 135 - 145 mmol/L 133  134  134   Potassium 3.5 - 5.1 mmol/L 3.7  3.7  4.4   Chloride 98 - 111 mmol/L 98  97  96   CO2 22 - 32 mmol/L 22  20  23    Calcium 8.9 - 10.3 mg/dL 7.1  7.0  7.3   Total Protein 6.5 - 8.1 g/dL 5.2  5.6    Total Bilirubin 0.3 - 1.2 mg/dL 1.6  1.6    Alkaline Phos 38 - 126 U/L 277  192    AST 15 - 41 U/L 424  222    ALT 0 - 44 U/L 174  106       Imaging studies:   CT Abdomen/Pelvis (09/29/2022) personally reviewed with noted emphysematous changes of the gallbladder with pneumobilia, and radiologist report reviewed below: IMPRESSION: 1.  Acute emphysematous cholecystitis. Recommend emergent surgical consultation. 2. Interval development of pneumoperitoneum. 3. Interval increase in small volume perihepatic ascites. 4. Gastric lumen distended with air-fluid level. Developing obstruction along the first portion of duodenum in the region of the pancreatic malignancy not excluded. 5. Metastatic pancreatic malignancy poorly evaluated on this noncontrast study. Please see CT abdomen pelvis 09/26/2022 for further details. 6. Partially visualized bilateral small pleural effusions with bilateral lower lobe passive atelectasis. 7.  Aortic Atherosclerosis (ICD10-I70.0).   Assessment/Plan: (ICD-10's: K81.0) 77 y.o. male with emphysematous cholecystitis with  pneumobilia, complicated by pertinent comorbidities including metastatic pancreatic cancer, now in atrial fibrillation with RVR and MSOF   - This is a rather unfortunate situation. Patient with advanced metastatic pancreatic cancer now with cardiopulmonary deterioration and appears to be developing MSOF requiring vasopressor support. Found to have emphysematous cholecystitis last night on imaging. Unfortunately, he is NOT a surgical candidate in this setting as this would carry an extremely high mortality in this setting with already overall grave prognosis. Not sure iof there is role for percutaneous cholecystostomy tube at this time; defer to IR. This would be permanent in nature. I do believe he is in the dying process. I had lengthy discussion with patient, his wife, and son at bedside this morning regarding the above. They seem to be understanding of the situation. Patient vocalizes wishes to be comfortable and "not be a burden to family." I do think at this time I would recommend palliative care for consideration of hospice/comfort measures. Continue IV Abx until formal decision made. Discussed with ICU as well. I will remain available to aid in discussion as needed. Will otherwise follow peripherally for now.   All of the above findings and recommendations were discussed with the patient, patient's family at bedside, and the medical team, and all of patient's and family's questions were answered to their expressed satisfaction.  Face-to-face time spent with the patient and care providers was >45 minutes, with more than 50% of the time spent counseling, educating, and coordinating care of the patient  -- Lynden Oxford, PA-C [facility] 09/30/2022, 8:43 AM M-F: 7am - 4pm

## 2022-09-30 NOTE — Progress Notes (Addendum)
   09/30/22 1020  Spiritual Encounters  Type of Visit Initial  Care provided to: Patient  Conversation partners present during encounter Nurse  Referral source Chaplain team  Reason for visit Routine spiritual support  OnCall Visit No   Attempted to visit with the patient, Cameron Gutierrez however he had gone for a procedure. I checked on family who I encountered in the hallway, they were emotional but otherwise ok. I invited them to sit and talk if they wanted someone to listen but they declined.   Valerie Roys [facility] [phone]

## 2022-10-01 DIAGNOSIS — Z515 Encounter for palliative care: Secondary | ICD-10-CM | POA: Diagnosis not present

## 2022-10-01 DIAGNOSIS — C259 Malignant neoplasm of pancreas, unspecified: Secondary | ICD-10-CM | POA: Diagnosis not present

## 2022-10-01 DIAGNOSIS — I4891 Unspecified atrial fibrillation: Secondary | ICD-10-CM | POA: Diagnosis not present

## 2022-10-01 LAB — PROTEIN ELECTROPHORESIS, SERUM
A/G Ratio: 0.6 — ABNORMAL LOW (ref 0.7–1.7)
Albumin ELP: 1.7 g/dL — ABNORMAL LOW (ref 2.9–4.4)
Alpha-1-Globulin: 0.5 g/dL — ABNORMAL HIGH (ref 0.0–0.4)
Alpha-2-Globulin: 0.9 g/dL (ref 0.4–1.0)
Beta Globulin: 0.7 g/dL (ref 0.7–1.3)
Gamma Globulin: 0.8 g/dL (ref 0.4–1.8)
Globulin, Total: 2.9 g/dL (ref 2.2–3.9)
Total Protein ELP: 4.6 g/dL — ABNORMAL LOW (ref 6.0–8.5)

## 2022-10-01 LAB — POTASSIUM: Potassium: 3.3 mmol/L — ABNORMAL LOW (ref 3.5–5.1)

## 2022-10-01 LAB — GLUCOSE, CAPILLARY
Glucose-Capillary: 105 mg/dL — ABNORMAL HIGH (ref 70–99)
Glucose-Capillary: 113 mg/dL — ABNORMAL HIGH (ref 70–99)
Glucose-Capillary: 117 mg/dL — ABNORMAL HIGH (ref 70–99)
Glucose-Capillary: 122 mg/dL — ABNORMAL HIGH (ref 70–99)
Glucose-Capillary: 132 mg/dL — ABNORMAL HIGH (ref 70–99)
Glucose-Capillary: 138 mg/dL — ABNORMAL HIGH (ref 70–99)

## 2022-10-01 LAB — CBC
HCT: 33.3 % — ABNORMAL LOW (ref 39.0–52.0)
Hemoglobin: 12.1 g/dL — ABNORMAL LOW (ref 13.0–17.0)
MCH: 27.6 pg (ref 26.0–34.0)
MCHC: 36.3 g/dL — ABNORMAL HIGH (ref 30.0–36.0)
MCV: 75.9 fL — ABNORMAL LOW (ref 80.0–100.0)
Platelets: 273 10*3/uL (ref 150–400)
RBC: 4.39 MIL/uL (ref 4.22–5.81)
RDW: 13.8 % (ref 11.5–15.5)
WBC: 14.1 10*3/uL — ABNORMAL HIGH (ref 4.0–10.5)
nRBC: 0 % (ref 0.0–0.2)

## 2022-10-01 LAB — CORTISOL: Cortisol, Plasma: 35.3 ug/dL

## 2022-10-01 LAB — CALCIUM, IONIZED: Calcium, Ionized, Serum: 3.7 mg/dL — ABNORMAL LOW (ref 4.5–5.6)

## 2022-10-01 LAB — RENAL FUNCTION PANEL
Albumin: 1.7 g/dL — ABNORMAL LOW (ref 3.5–5.0)
Anion gap: 13 (ref 5–15)
BUN: 86 mg/dL — ABNORMAL HIGH (ref 8–23)
CO2: 22 mmol/L (ref 22–32)
Calcium: 6.9 mg/dL — ABNORMAL LOW (ref 8.9–10.3)
Chloride: 98 mmol/L (ref 98–111)
Creatinine, Ser: 2.96 mg/dL — ABNORMAL HIGH (ref 0.61–1.24)
GFR, Estimated: 21 mL/min — ABNORMAL LOW (ref >60)
Glucose, Bld: 132 mg/dL — ABNORMAL HIGH (ref 70–99)
Phosphorus: 6.7 mg/dL — ABNORMAL HIGH (ref 2.5–4.6)
Potassium: 3 mmol/L — ABNORMAL LOW (ref 3.5–5.1)
Sodium: 133 mmol/L — ABNORMAL LOW (ref 135–145)

## 2022-10-01 LAB — APTT: aPTT: 128 seconds — ABNORMAL HIGH (ref 24–36)

## 2022-10-01 LAB — MAGNESIUM: Magnesium: 2.5 mg/dL — ABNORMAL HIGH (ref 1.7–2.4)

## 2022-10-01 LAB — HEPARIN LEVEL (UNFRACTIONATED)
Heparin Unfractionated: 0.38 IU/mL (ref 0.30–0.70)
Heparin Unfractionated: 0.33 IU/mL (ref 0.30–0.70)

## 2022-10-01 LAB — PROCALCITONIN: Procalcitonin: 9.04 ng/mL

## 2022-10-01 MED ORDER — LACTATED RINGERS IV SOLN: INTRAVENOUS | Status: DC

## 2022-10-01 MED ORDER — ALBUMIN HUMAN 25 % IV SOLN
25.0000 g | Freq: Once | INTRAVENOUS | Status: AC
  Administered 2022-10-01: 25 g via INTRAVENOUS
  Filled 2022-10-01 (×2): qty 100

## 2022-10-01 MED ORDER — LACTATED RINGERS IV BOLUS
1000.0000 mL | Freq: Once | INTRAVENOUS | Status: AC
  Administered 2022-10-01: 1000 mL via INTRAVENOUS

## 2022-10-01 MED ORDER — POTASSIUM CHLORIDE 10 MEQ/100ML IV SOLN
10.0000 meq | INTRAVENOUS | Status: AC
  Administered 2022-10-01 – 2022-10-02 (×3): 10 meq via INTRAVENOUS
  Filled 2022-10-01 (×3): qty 100

## 2022-10-01 MED ORDER — POTASSIUM CHLORIDE 10 MEQ/100ML IV SOLN
10.0000 meq | INTRAVENOUS | Status: AC
  Administered 2022-10-01 (×4): 10 meq via INTRAVENOUS
  Filled 2022-10-01 (×4): qty 100

## 2022-10-01 MED ORDER — MIDODRINE HCL 5 MG PO TABS
5.0000 mg | ORAL_TABLET | Freq: Three times a day (TID) | ORAL | Status: DC
  Administered 2022-10-01 – 2022-10-02 (×2): 5 mg via ORAL
  Filled 2022-10-01 (×3): qty 1

## 2022-10-01 MED ORDER — ALBUMIN HUMAN 25 % IV SOLN
25.0000 g | Freq: Every day | INTRAVENOUS | Status: AC
  Administered 2022-10-01 – 2022-10-04 (×4): 25 g via INTRAVENOUS
  Filled 2022-10-01 (×3): qty 100

## 2022-10-01 NOTE — Progress Notes (Signed)
NAME:  Cameron Gutierrez, MRN:  [id number], DOB:  1945/10/17, LOS: 4 ADMISSION DATE:  09/26/2022, CONSULTATION DATE: 09/28/2022 REFERRING MD: Dr. Allena Katz, CHIEF COMPLAINT: Chest Pain   History of Present Illness:  This is a 77 yo male who presented to [facility] ER on 07/13 with acute onset of severe abdominal/chest pain.  He was previously admitted at [facility] from 09/21/22-09/25/22 for abdominal pain along with unintentional weight loss.  During hospitalization at [facility] workup concerning for primary metastatic pancreatic adenocarcinoma.  He underwent ERCP/US with biopsy and stent placed on 09/23/22.  His pain was controlled post procedure, and pt discharged home on 09/25/22 (prescribed oxycodone and eliquis resumed).  ED Course Upon arrival to [facility] ER significant lab results were: Na+ 133/K+ 3.4/glucose 148/calcium 7.4/albumin 2.2/wbc 15.7.  CT Abd Pelvis concerning for moderate distension of the gallbladder but no findings of acute cholecystitis, however pt continued to c/o persistent abdominal pain.  EDP consulted with [facility] Oncologist Dr. Everardo All, and following review of imaging did not feel pt required transfer to [facility].  Pt subsequently admitted to [facility] medsurg unit per hospitalist team for additional workup and treatment due to continued abdominal pain.  See detailed hospital course below under significant events.  CT Abd/Pelvis W Contrast:  Large pancreatic head mass compatible with the patient's history of pancreatic cancer. The mass extends cephalad and involves the caudate lobe. There is also a subcapsular fluid collection or implant cephalad to the caudate lobe causing mass effect on the adjacent IVC. Multiple liver lesions, abdominal lymphadenopathy and multiple pulmonary nodules. Findings compatible with metastatic disease. Metallic biliary stent in the common bile duct and there is a small amount of pneumobilia. No significant intrahepatic biliary dilatation. In addition, there is moderate distention of  the gallbladder containing iodinated contrast and small amount of gas. Findings compatible with recent ERCP. Gallbladder distension could be a source of abdominal pain. Bilateral small pleural effusions with compressive atelectasis in the lower lobes. Small amount of free fluid in the abdomen and pelvis. Colonic diverticulosis without evidence for acute diverticulitis. Aortic Atherosclerosis (ICD10-I70.0). 9 mm splenic artery aneurysm.  CXR:  Low lung volumes with trace bilateral pleural effusions and bibasilar opacities favored to reflect areas of subsegmental atelectasis. Aortic atherosclerosis.  Pertinent  Medical History  Atrial Fibrillation  Primary Metastatic Pancreatic Adenocarcinoma  Type I Diabetes Mellitus  Hypercholesteremia  HTN   Significant Hospital Events: Including procedures, antibiotic start and stop dates in addition to other pertinent events   07/13: Pt admitted to the [facility] unit with diffuse abdominal pain following recent ERCP with stent placement and newly diagnosed primary pancreatic adenocarcinoma with metastasis 07/13: [place] Abd Limited RUQ revealed Gallbladder sludge with mild gallbladder           wall thickening and small volume pericholecystic fluid. Positive sonographic        Murphy sign. Findings are concerning for acute acalculous cholecystitis. Liver        metastases seen on CT are not well identified by ultrasound. 07/14: General surgery consulted for acute acalculous cholecystitis and recommended decompression of the gallbladder via percutaneous cholecystostomy per IR and continue abx therapy.  07/15: Pt arrived to special recovery for cholecystostomy drain placement and pt noted to be in atrial fibrillation with rvr heart rate 140 to 170's.  Cardiology notified pt received 5 mg of iv cardizem x1 dose/250 ml NS bolus/20 mg of iv lasix.  Procedure canceled and pt transferred to the stepdown unit for cardizem gtt.  PCCM team consulted  to assist with management.    09/29/22- patient in no distress, met with family today.  Awaiting percutaneous cholecystostomy and thoracentesis.  Testing for TLS today. 09/30/22- patient with emphysematous cholecystitis and pneumoperitoneum.  S/p throacentesis with partial clinical improvement.  Plan for IR consultation and cholecystostomy tube.   10/01/22-patient remains weak and chronically ill appearing.  He is s/p GB drain and plan today to remove NGT and start PO nourishment.  He is being seen by palliative cancer specialist.  Anti-infectives (From admission, onward)    Start     Dose/Rate Route Frequency Ordered Stop   09/30/22 0000  piperacillin-tazobactam (ZOSYN) IVPB 3.375 g        3.375 g 12.5 mL/hr over 240 Minutes Intravenous Every 8 hours 09/29/22 2307     09/29/22 1800  Ampicillin-Sulbactam (UNASYN) 3 g in sodium chloride 0.9 % 100 mL IVPB  Status:  Discontinued        3 g 200 mL/hr over 30 Minutes Intravenous Every 8 hours 09/29/22 1425 09/29/22 2307   09/27/22 1000  Ampicillin-Sulbactam (UNASYN) 3 g in sodium chloride 0.9 % 100 mL IVPB  Status:  Discontinued        3 g 200 mL/hr over 30 Minutes Intravenous Every 6 hours 09/27/22 0844 09/29/22 1425       Interim History / Subjective:  Pt currently requiring neo-synephrine gtt @ 20 mcg/min to maintain map >65. Cardizem gtt not infusing at this time pt remains in atrial fibrillation heart rate 114 to 120's   Objective   Blood pressure (!) 88/57, pulse (!) 104, temperature 98.5 F (36.9 C), temperature source Axillary, resp. rate 19, height 6' (1.829 m), weight 79.5 kg, SpO2 97%.        Intake/Output Summary (Last 24 hours) at 10/01/2022 0920 Last data filed at 10/01/2022 0856 Gross per 24 hour  Intake 3878.29 ml  Output 4120 ml  Net -241.71 ml   Filed Weights   09/26/22 0519 10/01/22 0500  Weight: 73.9 kg 79.5 kg    Examination: General: Acute on chronically-ill appearing male, NAD on 6L HFNC  HENT: Supple, no JVD  Lungs: Diminished  throughout, even, non labored  Cardiovascular: Irregular irregular, no r/g, 2+ radial/1+ distal pulses, no edema   Abdomen: Hypoactive BS x4, mild abdominal tenderness with distention, taut  Extremities: Normal bulk and tone, moves all extremities  Neuro: Alert and oriented, following commands, PERRLA GU: Indwelling foley catheter in place   Resolved Hospital Problem list     Assessment & Plan:   #Acute hypoxic respiratory failure secondary secondary to bilateral pleural effusions and atelectasis  - Supplemental O2 for dyspnea and/or hypoxia  - Maintain O2 sats 92% or higher  - Prn bronchodilator therapy  - Aggressive pulmonary hygiene - Intermittent CXR and ABG's  - Consider thoracentesis  -s/p throacentesis- 900cc removed  -atelelctasis noted - IS and metaneb ordered , discussed with RT today   Acute Kidney Injury -KDIGO 3   - multifactorial including ischemic/metabolic as well as possible Tumor lysis syndrome due to presence of AKI, hyperphosphatemia, arrythmias, chest discomfort.  Will obtain uric acid, LDH, G6PD in consideration for treatment with rasburicase.  -consider nephrology consultation  -IVF   #Atrial fibrillation with rvr  #Hypotension secondary to antiarrhythmic medication  - Continuous telemetry monitoring - Will check troponin and BNP  - Cardiology consulted appreciate input: recommending if renal function normal can consider digoxin, however if pt has evidence of renal failure recommend amiodarone gtt due to cardizem gtt causing hypotension  -  Echo pending  - Continue heparin gtt: dosing per pharmacy  - Prn neo-synephrine gtt to maintain map >65 - Hold outpatient antihypertensives   #Acalculous emphysematous cholecystitis  - Trend WBC and monitor fever curve  - Continue  abx therapy as outlined above  - General surgery consulted: recommended decompression of the gallbladder via percutaneous cholecystostomy per IR and continue abx therapy.  Procedure canceled  07/15 due to pt developing atrial fibrillation with rvr  - Prn oxycodone/fentanyl/morphine for pain management  -zosyn-    #Primary metastatic adenocarcinoma of the pancreas  - Oncology consulted appreciate input   #Type I diabetes mellitus  - CBG's ac/hs - SSI   Best Practice (right click and "Reselect all SmartList Selections" daily)   Diet/type: clear liquids DVT prophylaxis: systemic heparin GI prophylaxis: N/A Lines: N/A Foley:  Yes, and it is still needed Code Status:  full code Last date of multidisciplinary goals of care discussion [N/A]  Labs   CBC: Recent Labs  Lab 09/27/22 1421 09/28/22 1607 09/29/22 0201 09/30/22 0334 10/01/22 0448  WBC 21.3* 18.7* 20.7* 17.7* 14.1*  HGB 14.7 12.9* 12.8* 12.4* 12.1*  HCT 43.4 36.5* 37.3* 34.5* 33.3*  MCV 82.0 79.5* 81.3 77.5* 75.9*  PLT 341 327 333 288 273    Basic Metabolic Panel: Recent Labs  Lab 09/26/22 0655 09/28/22 1607 09/29/22 0949 09/30/22 0334 10/01/22 0448  NA 133* 134* 134* 133* 133*  K 3.4* 4.4 3.7 3.7 3.0*  CL 98 96* 97* 98 98  CO2 25 23 20* 22 22  GLUCOSE 148* 157* 176* 138* 132*  BUN 15 70* 85* 89* 86*  CREATININE 0.97 2.90* 3.17* 3.05* 2.96*  CALCIUM 7.4* 7.3* 7.0* 7.1* 6.9*  MG  --   --  2.4 2.5* 2.5*  PHOS  --   --  8.8* 7.7* 6.7*   GFR: Estimated Creatinine Clearance: 23.7 mL/min (A) (by C-G formula based on SCr of 2.96 mg/dL (H)). Recent Labs  Lab 09/28/22 1607 09/29/22 0201 09/30/22 0334 10/01/22 0448  PROCALCITON  --   --   --  9.04  WBC 18.7* 20.7* 17.7* 14.1*    Liver Function Tests: Recent Labs  Lab 09/26/22 0655 09/29/22 0949 09/30/22 0334 10/01/22 0448  AST 32 222* 424*  --   ALT 24 106* 174*  --   ALKPHOS 113 192* 277*  --   BILITOT 1.1 1.6* 1.6*  --   PROT 5.4* 5.6* 5.2*  --   ALBUMIN 2.2* 2.0* 1.9* 1.7*   Recent Labs  Lab 09/26/22 0655  LIPASE 19   No results for input(s): "AMMONIA" in the last 168 hours.  ABG    Component Value Date/Time   HCO3 27.9  03/26/2022 2105   O2SAT 22.9 03/26/2022 2105     Coagulation Profile: Recent Labs  Lab 09/27/22 1421 09/28/22 0916 09/29/22 0949 09/30/22 0334  INR 1.7* 1.7* 2.0* 2.0*    Cardiac Enzymes: No results for input(s): "CKTOTAL", "CKMB", "CKMBINDEX", "TROPONINI" in the last 168 hours.  HbA1C: Hgb A1c MFr Bld  Date/Time Value Ref Range Status  09/26/2022 12:54 PM 7.0 (H) 4.8 - 5.6 % Final    Comment:    (NOTE)         Prediabetes: 5.7 - 6.4         Diabetes: >6.4         Glycemic control for adults with diabetes: <7.0   03/27/2022 05:49 AM 11.8 (H) 4.8 - 5.6 % Final    Comment:    (NOTE)  Pre diabetes:          5.7%-6.4%  Diabetes:              >6.4%  Glycemic control for   <7.0% adults with diabetes     CBG: Recent Labs  Lab 09/30/22 1633 09/30/22 1917 09/30/22 2325 10/01/22 0331 10/01/22 0758  GLUCAP 135* 109* 109* 138* 105*    Review of Systems:   Gen: Denies fever, chills, weight change, fatigue, night sweats HEENT: Denies blurred vision, double vision, hearing loss, tinnitus, sinus congestion, rhinorrhea, sore throat, neck stiffness, dysphagia PULM: Denies shortness of breath, cough, sputum production, hemoptysis, wheezing CV: Denies chest pain, edema, orthopnea, paroxysmal nocturnal dyspnea, palpitations GI: abdominal pain, nausea, vomiting, diarrhea, hematochezia, melena, constipation, change in bowel habits GU: Denies dysuria, hematuria, polyuria, oliguria, urethral discharge Endocrine: Denies hot or cold intolerance, polyuria, polyphagia or appetite change Derm: Denies rash, dry skin, scaling or peeling skin change Heme: Denies easy bruising, bleeding, bleeding gums Neuro: Denies headache, numbness, weakness, slurred speech, loss of memory or consciousness   Past Medical History:  He,  has a past medical history of Atrial fibrillation (HCC), Cancer (HCC), Diabetes mellitus without complication (HCC), Hypercholesteremia, and Hypertension.   Surgical  History:   Past Surgical History:  Procedure Laterality Date   KNEE SURGERY     REPLACEMENT TOTAL KNEE Right    SHOULDER SURGERY       Social History:   reports that he has never smoked. He has never used smokeless tobacco. He reports that he does not currently use alcohol. He reports that he does not use drugs.   Family History:  His family history is not on file.   Allergies No Known Allergies   Home Medications  Prior to Admission medications   Medication Sig Start Date End Date Taking? Authorizing Provider  amLODipine (NORVASC) 10 MG tablet Take 10 mg by mouth daily. 03/20/21  Yes [provider]  atorvastatin (LIPITOR) 40 MG tablet Take 40 mg by mouth every morning. 03/20/21  Yes [provider]  ELIQUIS 5 MG TABS tablet Take 5 mg by mouth 2 (two) times daily. 03/20/21  Yes [provider]  insulin glargine (LANTUS) 100 UNIT/ML injection Inject 18 Units into the skin daily.   Yes [provider]  lisinopril (ZESTRIL) 5 MG tablet Take 5 mg by mouth every morning. 03/02/21  Yes [provider]  oxyCODONE (OXY IR/ROXICODONE) 5 MG immediate release tablet Take 5 mg by mouth every 6 (six) hours as needed for severe pain.   Yes [provider]  blood glucose meter kit and supplies KIT Dispense based on patient and insurance preference. Use up to four times daily as directed. 03/27/22   Sherryll Burger, Pratik D, DO  Insulin Pen Needle (PEN NEEDLES 3/16") 31G X 5 MM MISC Take as directed with insulin pen. 03/27/22   Maurilio Lovely D, DO     Critical care provider statement:   Total critical care time: 33 minutes   Performed by: Karna Christmas MD   Critical care time was exclusive of separately billable procedures and treating other patients.   Critical care was necessary to treat or prevent imminent or life-threatening deterioration.   Critical care was time spent personally by me on the following activities: development of treatment plan with  patient and/or surrogate as well as nursing, discussions with consultants, evaluation of patient's response to treatment, examination of patient, obtaining history from patient or surrogate, ordering and performing treatments and interventions, ordering and review  of laboratory studies, ordering and review of radiographic studies, pulse oximetry and re-evaluation of patient's condition.    Vida Rigger, M.D.  Pulmonary & Critical Care Medicine

## 2022-10-01 NOTE — Progress Notes (Signed)
ANTICOAGULATION CONSULT NOTE   Pharmacy Consult for IV heparin Indication: atrial fibrillation  No Known Allergies  Patient Measurements: Height: 6' (182.9 cm) Weight: 73.9 kg (163 lb) IBW/kg (Calculated) : 77.6 Heparin Dosing Weight: 73.9 kg  Vital Signs: Temp: 97.9 F (36.6 C) (07/18 0010) Temp Source: Oral (07/18 0010) BP: 91/63 (07/18 0115) Pulse Rate: 100 (07/18 0115)  Labs: Recent Labs    09/28/22 0916 09/28/22 1607 09/28/22 1607 09/28/22 1834 09/29/22 0201 09/29/22 0949 09/29/22 2246 09/30/22 0334 09/30/22 0851 10/01/22 0013  HGB  --  12.9*   < >  --  12.8*  --   --  12.4*  --   --   HCT  --  36.5*  --   --  37.3*  --   --  34.5*  --   --   PLT  --  327  --   --  333  --   --  288  --   --   APTT  --  39*   < >  --  109* 100* 128*  --  114* 128*  LABPROT 20.0*  --   --   --   --  22.5*  --  23.2*  --   --   INR 1.7*  --   --   --   --  2.0*  --  2.0*  --   --   HEPARINUNFRC  --  0.71*   < >  --  0.71*  --   --   --  0.38 0.38  CREATININE  --  2.90*  --   --   --  3.17*  --  3.05*  --   --   TROPONINIHS  --   --   --  23*  --   --   --   --   --   --    < > = values in this interval not displayed.    Estimated Creatinine Clearance: 21.9 mL/min (A) (by C-G formula based on SCr of 3.05 mg/dL (H)).   Medical History: Past Medical History:  Diagnosis Date   Atrial fibrillation (HCC)    Cancer (HCC)    pancreatic   Diabetes mellitus without complication (HCC)    Hypercholesteremia    Hypertension     Medications:  Eliquis 5 mg BID (last dose 09/24/22)  Assessment: 77 year old male with PMH atrial fibrillation on Eliquis prior to admission. Plan was for cholecystectomy drain placement, but was cancelled due to acute Afib. Last dose was > 2 days ago.   7/16 0201 aPTT 109 and heparin level 0.7.  7/16 0949 aPTT 100, therapeutic x 1 7/16 2246 aPTT 128  7/17 0851 aPTT HL 0.38 - no note written. Heparin was stopped around 0910. Restarted 1543 7/18 0013  aPTT 128 HL 0.38.   Goal of Therapy:  HL 0.3 - 0.7  Monitor platelets by anticoagulation protocol: Yes   Plan:  aPTT is supratherapeutic, but heparin level is therapeutic. Will switch to heparin level monitoring. Continue heparin infusion at 850 units/hr. Recheck heparin level in 8 hours for confirmation. CBC daily while on heparin.   Ronnald Ramp, PharmD Clinical Pharmacist 10/01/2022 1:31 AM

## 2022-10-01 NOTE — Progress Notes (Signed)
ANTICOAGULATION CONSULT NOTE   Pharmacy Consult for IV heparin Indication: atrial fibrillation  No Known Allergies  Patient Measurements: Height: 6' (182.9 cm) Weight: 79.5 kg (175 lb 4.3 oz) IBW/kg (Calculated) : 77.6 Heparin Dosing Weight: 73.9 kg  Vital Signs: Temp: 98.5 F (36.9 C) (07/18 0330) Temp Source: Axillary (07/18 0330) BP: 83/59 (07/18 1215) Pulse Rate: 104 (07/18 1215)  Labs: Recent Labs    09/28/22 1834 09/29/22 0201 09/29/22 0949 09/29/22 2246 09/30/22 0334 09/30/22 0851 10/01/22 0013 10/01/22 0448 10/01/22 0839  HGB  --  12.8*  --   --  12.4*  --   --  12.1*  --   HCT  --  37.3*  --   --  34.5*  --   --  33.3*  --   PLT  --  333  --   --  288  --   --  273  --   APTT  --  109* 100* 128*  --  114* 128*  --   --   LABPROT  --   --  22.5*  --  23.2*  --   --   --   --   INR  --   --  2.0*  --  2.0*  --   --   --   --   HEPARINUNFRC  --  0.71*  --   --   --  0.38 0.38  --  0.33  CREATININE  --   --  3.17*  --  3.05*  --   --  2.96*  --   TROPONINIHS 23*  --   --   --   --   --   --   --   --     Estimated Creatinine Clearance: 23.7 mL/min (A) (by C-G formula based on SCr of 2.96 mg/dL (H)).   Medical History: Past Medical History:  Diagnosis Date   Atrial fibrillation (HCC)    Cancer (HCC)    pancreatic   Diabetes mellitus without complication (HCC)    Hypercholesteremia    Hypertension     Medications:  Eliquis 5 mg BID (last dose 09/24/22)  Assessment: 77 year old male with PMH atrial fibrillation on Eliquis prior to admission. Plan was for cholecystectomy drain placement, but was cancelled due to acute Afib. Last dose was > 2 days ago.   7/16 0201 aPTT 109 and heparin level 0.7.  7/16 0949 aPTT 100, therapeutic x 1 7/16 2246 aPTT 128  7/17 0851 aPTT HL 0.38 - no note written. Heparin was stopped around 0910. Restarted 1543 7/18 0013 aPTT 128 HL 0.38.  7/18 0839 HL 0.33  Goal of Therapy:  HL 0.3 - 0.7  Monitor platelets by  anticoagulation protocol: Yes   Plan:  Therapeutic x 2 Continue heparin infusion at 850 units/hr Recheck heparin level with AM labs  CBC daily while on heparin.   Bettey Costa, PharmD Clinical Pharmacist 10/01/2022 1:08 PM

## 2022-10-01 NOTE — Progress Notes (Signed)
Referring Physician(s): Dr. Claudine Mouton  Supervising Physician: Marliss Coots  Patient Status:  [facility] - In-pt  Reason for visit: Emphysematous/ruptured acute cholecystitis with associated perihepatic fluid collection.  S/p Successful 20F cholecystostomy drain placed 7/17 with 100 mL of bilious/purulent fluid aspirated. S/p Successful 20F RUQ perihepatic fluid collection drain with aspiration of 500 mL of bilious/purulent fluid 7/17.  Subjective: Patient states his abdominal pain is better since the drain placement.  Allergies: Patient has no known allergies.  Medications: Prior to Admission medications   Medication Sig Start Date End Date Taking? Authorizing Provider  amLODipine (NORVASC) 10 MG tablet Take 10 mg by mouth daily. 03/20/21  Yes [provider]  atorvastatin (LIPITOR) 40 MG tablet Take 40 mg by mouth every morning. 03/20/21  Yes [provider]  ELIQUIS 5 MG TABS tablet Take 5 mg by mouth 2 (two) times daily. 03/20/21  Yes [provider]  insulin glargine (LANTUS) 100 UNIT/ML injection Inject 18 Units into the skin daily.   Yes [provider]  lisinopril (ZESTRIL) 5 MG tablet Take 5 mg by mouth every morning. 03/02/21  Yes [provider]  oxyCODONE (OXY IR/ROXICODONE) 5 MG immediate release tablet Take 5 mg by mouth every 6 (six) hours as needed for severe pain.   Yes [provider]  blood glucose meter kit and supplies KIT Dispense based on patient and insurance preference. Use up to four times daily as directed. 03/27/22   Sherryll Burger, Pratik D, DO  Insulin Pen Needle (PEN NEEDLES 3/16") 31G X 5 MM MISC Take as directed with insulin pen. 03/27/22   Sherryll Burger, Pratik D, DO   Vital Signs: BP (!) 83/59   Pulse (!) 104   Temp 98.5 F (36.9 C) (Axillary)   Resp (!) 26   Ht 6' (1.829 m)   Wt 175 lb 4.3 oz (79.5 kg)   SpO2 97%   BMI 23.77 kg/m   Physical Exam General: Ill-appearing Abd: RUQ drain and RLQ drain intact.  Minimal bilious output in both, RN just flushed and emptied per patient.  Output by Drain (mL) 09/29/22 0701 - 09/29/22 1900 09/29/22 1901 - 09/30/22 0700 09/30/22 0701 - 09/30/22 1900 09/30/22 1901 - 10/01/22 0700 10/01/22 0701 - 10/01/22 1300  Closed System Drain 2 Right;Anterior Abdomen 10 Fr.   110 20 15  Closed System Drain 1 Right RLQ 10 Fr.   705 98 65   Imaging: DG Abd 1 View  Result Date: 09/30/2022 CLINICAL DATA:  NG tube placement EXAM: ABDOMEN - 1 VIEW COMPARISON:  CT abdomen/pelvis 1 day prior FINDINGS: The enteric catheter tip and sidehole are in the stomach. The stomach remains significantly distended. Air in the gallbladder lumen and wall is noted consistent with the findings of emphysematous cholecystitis seen on CT from 1 day prior. The free intraperitoneal air seen on that study is not definitely seen on this study. There is gaseous distention of the bowel in the imaged abdomen. A biliary stent is noted. IMPRESSION: 1. Enteric catheter tip and sidehole in the stomach which remains distended. 2. Air in the gallbladder lumen and wall consistent with the findings of emphysematous cholecystitis on the CT study from 1 day prior. The pneumoperitoneum seen on that study is not appreciated on the current study. Electronically Signed   By: Lesia Hausen M.D.   On: 09/30/2022 13:28   CT PERC CHOLECYSTOSTOMY  Result Date: 09/30/2022 INDICATION: 77 year old male with history recently diagnosed advanced stage pancreatic cancer with biliary obstruction status post  covered common bile duct stent placement at outside facility approximally 1 week ago, now with emphysematous/ruptured acute cholecystitis with associated perihepatic fluid collection. EXAM: CT PERC DRAIN PERITONEAL ABCESS; CT PERCUTANEOUS CHOLECYSTOSTOMY COMPARISON:  None Available. MEDICATIONS: The patient is currently admitted to the hospital and receiving intravenous antibiotics. The antibiotics were administered within an appropriate  time frame prior to the initiation of the procedure. ANESTHESIA/SEDATION: Moderate (conscious) sedation was employed during this procedure. A total of Versed 0.5 mg and Fentanyl 25 mcg was administered intravenously. Moderate Sedation Time: 16 minutes. The patient's level of consciousness and vital signs were monitored continuously by radiology nursing throughout the procedure under my direct supervision. CONTRAST:  None COMPLICATIONS: None immediate. PROCEDURE: RADIATION DOSE REDUCTION: This exam was performed according to the departmental dose-optimization program which includes automated exposure control, adjustment of the mA and/or kV according to patient size and/or use of iterative reconstruction technique. Informed written consent was obtained from the patient after a discussion of the risks, benefits and alternatives to treatment. The patient was placed supine on the CT gantry and a pre procedural CT was performed re-demonstrating the known abscess/fluid collection within the gallbladder and perihepatic region. The procedure was planned. A timeout was performed prior to the initiation of the procedure. The right upper quadrant was prepped and draped in the usual sterile fashion. The overlying soft tissues were anesthetized with 1% lidocaine with epinephrine. Appropriate trajectories were planned with the use of a 22 gauge spinal needle. Two separate 18 gauge trocar needles were advanced into the gallbladder as well as into the perihepatic fluid collection and short Amplatz super stiff wires were coiled within the gallbladder and perihepatic fluid collection. Appropriate positioning was confirmed with a limited CT scan. The tracks were serially dilated allowing placement of 2, 10 French all-purpose drainage catheters. Appropriate positioning was confirmed with a limited postprocedural CT scan. Approximately 100 ml of bilious and purulent fluid was aspirated from the gallbladder in a proximally 500 mL of  similar appearing, bilious and purulent fluid was aspirated from the perihepatic fluid collection. The drains were connected to a bulb suction and sutured in place. Dressings were placed. The patient tolerated the procedure well without immediate post procedural complication. IMPRESSION: 1. Successful CT guided placement of a 10 French percutaneous cholecystostomy drain with aspiration of 100 mL of bilious and purulent fluid. Samples were sent to the laboratory as requested by the ordering clinical team. 2. Successful CT guided placement of a 10 French right upper quadrant perihepatic fluid collection drain with aspiration of 500 mL of bilious and purulent fluid. Marliss Coots, MD [facility] Radiology Electronically Signed   By: Marliss Coots M.D.   On: 09/30/2022 13:19   CT GUIDED PERITONEAL/RETROPERITONEAL FLUID DRAIN BY PERC CATH  Result Date: 09/30/2022 INDICATION: 77 year old male with history recently diagnosed advanced stage pancreatic cancer with biliary obstruction status post covered common bile duct stent placement at outside facility approximally 1 week ago, now with emphysematous/ruptured acute cholecystitis with associated perihepatic fluid collection. EXAM: CT PERC DRAIN PERITONEAL ABCESS; CT PERCUTANEOUS CHOLECYSTOSTOMY COMPARISON:  None Available. MEDICATIONS: The patient is currently admitted to the hospital and receiving intravenous antibiotics. The antibiotics were administered within an appropriate time frame prior to the initiation of the procedure. ANESTHESIA/SEDATION: Moderate (conscious) sedation was employed during this procedure. A total of Versed 0.5 mg and Fentanyl 25 mcg was administered intravenously. Moderate Sedation Time: 16 minutes. The patient's level of consciousness and vital signs were monitored continuously by radiology  nursing throughout the procedure under my direct supervision. CONTRAST:  None COMPLICATIONS: None  immediate. PROCEDURE: RADIATION DOSE REDUCTION: This exam was performed according to the departmental dose-optimization program which includes automated exposure control, adjustment of the mA and/or kV according to patient size and/or use of iterative reconstruction technique. Informed written consent was obtained from the patient after a discussion of the risks, benefits and alternatives to treatment. The patient was placed supine on the CT gantry and a pre procedural CT was performed re-demonstrating the known abscess/fluid collection within the gallbladder and perihepatic region. The procedure was planned. A timeout was performed prior to the initiation of the procedure. The right upper quadrant was prepped and draped in the usual sterile fashion. The overlying soft tissues were anesthetized with 1% lidocaine with epinephrine. Appropriate trajectories were planned with the use of a 22 gauge spinal needle. Two separate 18 gauge trocar needles were advanced into the gallbladder as well as into the perihepatic fluid collection and short Amplatz super stiff wires were coiled within the gallbladder and perihepatic fluid collection. Appropriate positioning was confirmed with a limited CT scan. The tracks were serially dilated allowing placement of 2, 10 French all-purpose drainage catheters. Appropriate positioning was confirmed with a limited postprocedural CT scan. Approximately 100 ml of bilious and purulent fluid was aspirated from the gallbladder in a proximally 500 mL of similar appearing, bilious and purulent fluid was aspirated from the perihepatic fluid collection. The drains were connected to a bulb suction and sutured in place. Dressings were placed. The patient tolerated the procedure well without immediate post procedural complication. IMPRESSION: 1. Successful CT guided placement of a 10 French percutaneous cholecystostomy drain with aspiration of 100 mL of bilious and purulent fluid. Samples were sent to  the laboratory as requested by the ordering clinical team. 2. Successful CT guided placement of a 10 French right upper quadrant perihepatic fluid collection drain with aspiration of 500 mL of bilious and purulent fluid. Marliss Coots, MD [facility] Electronically Signed   By: Marliss Coots M.D.   On: 09/30/2022 13:19   CT ABDOMEN PELVIS WO CONTRAST  Addendum Date: 09/29/2022   ADDENDUM REPORT: 09/29/2022 23:18 ADDENDUM: These results were called by telephone at the time of interpretation on 09/29/2022 at 11:14 pm to provider Dr. Everlene Farrier, who verbally acknowledged these results. Electronically Signed   By: Tish Frederickson M.D.   On: 09/29/2022 23:18   Result Date: 09/29/2022 CLINICAL DATA:  Bowel obstruction suspected EXAM: CT ABDOMEN AND PELVIS WITHOUT CONTRAST TECHNIQUE: Multidetector CT imaging of the abdomen and pelvis was performed following the standard protocol without IV contrast. RADIATION DOSE REDUCTION: This exam was performed according to the departmental dose-optimization program which includes automated exposure control, adjustment of the mA and/or kV according to patient size and/or use of iterative reconstruction technique. COMPARISON:  CT abdomen pelvis 09/26/2022 FINDINGS: Lower chest: Partially visualized bilateral small pleural effusions with bilateral lower lobe passive atelectasis. Hepatobiliary: Redemonstration of a couple of hepatic lesions consistent with known metastases. Air-fluid level within the gastric lumen. Air noted within the wall of the gallbladder. Common bile duct stent in appropriate position with associated pneumobilia. Pancreas: Proximal pancreatic mass again noted in difficult to delineate on this study. Finding measures approximately 12.6 x 4.7 cm extending along the liver and duodenum. No surrounding inflammatory changes. No main pancreatic ductal dilatation. Spleen: Normal in size without focal abnormality.  Adrenals/Urinary Tract: No adrenal nodule bilaterally. No nephrolithiasis and no hydronephrosis. Several fluid  density lesions likely represent simple renal cysts. Simple renal cysts, in the absence of clinically indicated signs/symptoms, require no independent follow-up. No ureterolithiasis or hydroureter. The urinary bladder is decompressed with Foley catheter tip and balloon terminating within the lumen. Stomach/Bowel: Stomach distended with air-fluid level. Bowel thickening of the first portion of the duodenum suggestive of possible metastatic invasion. No evidence of large bowel wall thickening or dilatation. Colonic diverticulosis. Appendix appears normal. Vascular/Lymphatic: No abdominal aorta or iliac aneurysm. Moderate atherosclerotic plaque of the aorta and its branches. No abdominal, pelvic, or inguinal lymphadenopathy. Reproductive: Prostate is unremarkable. Other: Interval increase in trace to small volume simple free fluid within the right upper quadrant. Associated free gas. No intraperitoneal free gas. No organized fluid collection. Musculoskeletal: Bilateral inguinal hernias containing fat and fluid. Subcutaneus soft tissue edema. No suspicious lytic or blastic osseous lesions. No acute displaced fracture. Multilevel degenerative changes of the spine. IMPRESSION: 1. Acute emphysematous cholecystitis. Recommend emergent surgical consultation. 2. Interval development of pneumoperitoneum. 3. Interval increase in small volume perihepatic ascites. 4. Gastric lumen distended with air-fluid level. Developing obstruction along the first portion of duodenum in the region of the pancreatic malignancy not excluded. 5. Metastatic pancreatic malignancy poorly evaluated on this noncontrast study. Please see CT abdomen pelvis 09/26/2022 for further details. 6. Partially visualized bilateral small pleural effusions with bilateral lower lobe passive atelectasis. 7.  Aortic Atherosclerosis (ICD10-I70.0). These  results were called by telephone at the time of interpretation on 09/29/2022 at 10:37 pm to provider Dr. Karna Christmas, who verbally acknowledged these results. Electronically Signed: By: Tish Frederickson M.D. On: 09/29/2022 22:44   US THORACENTESIS ASP PLEURAL SPACE W/IMG GUIDE  Result Date: 09/29/2022 INDICATION: Right pleural effusion.  Pancreatic cancer. EXAM: ULTRASOUND GUIDED RIGHT THORACENTESIS MEDICATIONS: None. COMPLICATIONS: None immediate. PROCEDURE: An ultrasound guided thoracentesis was thoroughly discussed with the patient and questions answered. The benefits, risks, alternatives and complications were also discussed. The patient understands and wishes to proceed with the procedure. Written consent was obtained. Ultrasound was performed to localize and mark an adequate pocket of fluid in the right chest. The area was then prepped and draped in the normal sterile fashion. 1% Lidocaine was used for local anesthesia. Under ultrasound guidance a 6 Fr Safe-T-Centesis catheter was introduced. Thoracentesis was performed. The catheter was removed and a dressing applied. FINDINGS: A total of approximately 800 mL of dark yellow fluid was removed. Samples were sent to the laboratory as requested by the clinical team. Images were also obtained of the right upper abdomen to evaluate the gallbladder. Loculated perihepatic ascites is identified. Previously identified gallbladder is poorly characterized because there appears to be air within the gallbladder. The amount of fluid around the liver has significantly increased since the right upper quadrant ultrasound 06/27/2022. IMPRESSION: 1. Successful ultrasound guided right thoracentesis yielding 800 mL of pleural fluid. 2. Images of the right upper quadrant demonstrate markedly increased perihepatic ascites. Ascites appears to be complex. In addition, the gallbladder now appears to contain a large amount of gas which is likely related to the biliary stent and  pneumobilia. Electronically Signed   By: Richarda Overlie M.D.   On: 09/29/2022 15:52   DG Chest Port 1 View  Result Date: 09/29/2022 CLINICAL DATA:  Pleural effusion.  Status post thoracentesis. EXAM: PORTABLE CHEST 1 VIEW COMPARISON:  Chest radiographs 09/28/2022 and 09/26/2022. Abdominal CT 09/26/2022. FINDINGS: 1417 hours. Interval decreased volume of right pleural effusion. A left pleural effusion and left basilar airspace disease appear unchanged. No pneumothorax. The  heart size and mediastinal contours are stable. Moderate gaseous distension of the stomach noted. Curvilinear lucency in the right upper quadrant corresponds with air in the gallbladder lumen on recent CT. Patient has a biliary stent. IMPRESSION: 1. Decreased right pleural effusion following thoracentesis. No pneumothorax. 2. No significant change in left pleural effusion and left basilar airspace disease. Electronically Signed   By: Carey Bullocks M.D.   On: 09/29/2022 15:46   ECHOCARDIOGRAM COMPLETE  Result Date: 09/29/2022    ECHOCARDIOGRAM REPORT   Patient Name:   ADONAI SELSOR Date of Exam: 09/29/2022 Medical Rec #:  [id number]       Height:       72.0 in Accession #:    [id number]      Weight:       163.0 lb Date of Birth:  Nov 09, 1945       BSA:          1.953 m Patient Age:    76 years        BP:           112/74 mmHg Patient Gender: M               HR:           115 bpm. Exam Location:  [facility] Procedure: 2D Echo, Cardiac Doppler and Color Doppler Indications:     Atrial Fibrillation I48.91  History:         Patient has no prior history of Echocardiogram examinations.                  Arrythmias:Atrial Fibrillation; Risk Factors:Diabetes and                  Hypertension.  Sonographer:     Cristela Blue Referring Phys:  [id number] SONA PATEL Diagnosing Phys: Yvonne Kendall MD IMPRESSIONS  1. Left ventricular ejection fraction, by estimation, is 60 to 65%. The left ventricle has normal function. The left ventricle has no regional wall motion  abnormalities. There is mild asymmetric left ventricular hypertrophy of the basal-septal segment. Left ventricular diastolic function could not be evaluated.  2. Pulmonary artery pressure is mildly elevated (RVSP 30-35 mmHg plus central venous/right atrial pressure). Right ventricular systolic function is normal. The right ventricular size is normal.  3. Moderate pleural effusion in the right lateral region.  4. The mitral valve is normal in structure. Mild to moderate mitral valve regurgitation.  5. Tricuspid valve regurgitation is mild to moderate.  6. The aortic valve is tricuspid. Aortic valve regurgitation is mild. No aortic stenosis is present. FINDINGS  Left Ventricle: Left ventricular ejection fraction, by estimation, is 60 to 65%. The left ventricle has normal function. The left ventricle has no regional wall motion abnormalities. The left ventricular internal cavity size was normal in size. There is  mild asymmetric left ventricular hypertrophy of the basal-septal segment. Left ventricular diastolic function could not be evaluated due to atrial fibrillation. Left ventricular diastolic function could not be evaluated. Right Ventricle: Pulmonary artery pressure is mildly elevated (RVSP 30-35 mmHg plus central venous/right atrial pressure). The right ventricular size is normal. No increase in right ventricular wall thickness. Right ventricular systolic function is normal. Left Atrium: Left atrial size was normal in size. Right Atrium: Right atrial size was normal in size. Pericardium: The pericardium was not well visualized. Mitral Valve: The mitral valve is normal in structure. Mild to moderate mitral valve regurgitation. Tricuspid Valve: The tricuspid valve is normal in structure. Tricuspid valve  regurgitation is mild to moderate. Aortic Valve: The aortic valve is tricuspid. Aortic valve regurgitation is mild. No aortic stenosis is present. Aortic valve mean gradient measures 3.0 mmHg. Aortic valve peak  gradient measures 4.7 mmHg. Aortic valve area, by VTI measures 3.27 cm. Pulmonic Valve: The pulmonic valve was grossly normal. Pulmonic valve regurgitation is trivial. No evidence of pulmonic stenosis. Aorta: The aortic root is normal in size and structure. Pulmonary Artery: The pulmonary artery is not well seen. Venous: The inferior vena cava was not well visualized. IAS/Shunts: The interatrial septum was not well visualized. Additional Comments: There is a moderate pleural effusion in the right lateral region.  LEFT VENTRICLE PLAX 2D LVIDd:         3.90 cm LVIDs:         2.60 cm LV PW:         0.90 cm LV IVS:        1.20 cm LVOT diam:     2.20 cm LV SV:         47 LV SV Index:   24 LVOT Area:     3.80 cm  RIGHT VENTRICLE RV Basal diam:  3.50 cm RV Mid diam:    2.80 cm LEFT ATRIUM             Index        RIGHT ATRIUM           Index LA diam:        4.30 cm 2.20 cm/m   RA Area:     16.20 cm LA Vol (A2C):   70.1 ml 35.89 ml/m  RA Volume:   34.00 ml  17.41 ml/m LA Vol (A4C):   47.7 ml 24.42 ml/m LA Biplane Vol: 60.7 ml 31.08 ml/m  AORTIC VALVE AV Area (Vmax):    2.81 cm AV Area (Vmean):   2.73 cm AV Area (VTI):     3.27 cm AV Vmax:           108.00 cm/s AV Vmean:          74.350 cm/s AV VTI:            0.143 m AV Peak Grad:      4.7 mmHg AV Mean Grad:      3.0 mmHg LVOT Vmax:         79.80 cm/s LVOT Vmean:        53.400 cm/s LVOT VTI:          0.123 m LVOT/AV VTI ratio: 0.86  AORTA Ao Root diam: 3.66 cm MITRAL VALVE               TRICUSPID VALVE MV Area (PHT): 3.63 cm    TR Peak grad:   32.9 mmHg MV Decel Time: 209 msec    TR Vmax:        287.00 cm/s MV E velocity: 86.50 cm/s                            SHUNTS                            Systemic VTI:  0.12 m                            Systemic Diam: 2.20 cm Yvonne Kendall MD Electronically signed by Yvonne Kendall MD Signature Date/Time:  09/29/2022/2:31:41 PM    Final    US RENAL  Result Date: 09/29/2022 CLINICAL DATA:  Acute renal failure. EXAM:  RENAL / URINARY TRACT ULTRASOUND COMPLETE COMPARISON:  September 26, 2022. FINDINGS: Right Kidney: Renal measurements: 9.6 x 5.1 x 4.7 cm = volume: 122 mL. At least 2 parapelvic cysts are noted, the largest measuring 2.3 cm. Increased echogenicity of renal parenchyma is noted. No mass or hydronephrosis visualized. Left Kidney: Renal measurements: 10.1 x 5.9 x 5.5 cm = volume: 170 mL. Multi septated cyst measuring 2.3 x 2.3 x 1.9 cm is noted. 2.5 cm simple cyst is noted. Increased echogenicity of renal parenchyma is noted. No hydronephrosis visualized. Bladder: Appears normal for degree of bladder distention. Other: None. IMPRESSION: 2.3 cm multi-septated complex cyst seen in left kidney. Follow-up ultrasound in 6-12 months is recommended to ensure stability. Increased echogenicity of renal parenchyma is noted bilaterally suggesting medical renal disease. No hydronephrosis or renal obstruction is noted. Electronically Signed   By: Lupita Raider M.D.   On: 09/29/2022 13:28   DG Chest Port 1 View  Result Date: 09/28/2022 CLINICAL DATA:  Shortness of breath. EXAM: PORTABLE CHEST 1 VIEW COMPARISON:  September 26, 2022 FINDINGS: Calcific atherosclerotic disease of the aorta. Cardiomediastinal silhouette is enlarged. Mediastinal contours appear intact. Bilateral pleural effusions, right greater than left. No focal airspace consolidation. Osseous structures are without acute abnormality. Soft tissues are grossly normal. IMPRESSION: 1. Bilateral pleural effusions, right greater than left. 2. Enlarged cardiomediastinal silhouette. Electronically Signed   By: Ted Mcalpine M.D.   On: 09/28/2022 15:37    Labs:  CBC: Recent Labs    09/28/22 1607 09/29/22 0201 09/30/22 0334 10/01/22 0448  WBC 18.7* 20.7* 17.7* 14.1*  HGB 12.9* 12.8* 12.4* 12.1*  HCT 36.5* 37.3* 34.5* 33.3*  PLT 327 333 288 273    COAGS: Recent Labs    09/27/22 1421 09/28/22 0916 09/28/22 1607 09/29/22 0949 09/29/22 2246 09/30/22 0334  09/30/22 0851 10/01/22 0013  INR 1.7* 1.7*  --  2.0*  --  2.0*  --   --   APTT  --   --    < > 100* 128*  --  114* 128*   < > = values in this interval not displayed.    BMP: Recent Labs    09/28/22 1607 09/29/22 0949 09/30/22 0334 10/01/22 0448  NA 134* 134* 133* 133*  K 4.4 3.7 3.7 3.0*  CL 96* 97* 98 98  CO2 23 20* 22 22  GLUCOSE 157* 176* 138* 132*  BUN 70* 85* 89* 86*  CALCIUM 7.3* 7.0* 7.1* 6.9*  CREATININE 2.90* 3.17* 3.05* 2.96*  GFRNONAA 22* 20* 21* 21*    LIVER FUNCTION TESTS: Recent Labs    03/27/22 0549 09/26/22 0655 09/29/22 0949 09/30/22 0334 10/01/22 0448  BILITOT 0.8 1.1 1.6* 1.6*  --   AST 19 32 222* 424*  --   ALT 20 24 106* 174*  --   ALKPHOS 67 113 192* 277*  --   PROT 6.0* 5.4* 5.6* 5.2*  --   ALBUMIN 3.3* 2.2* 2.0* 1.9* 1.7*    Assessment and Plan: 77 year old male with history of recently diagnosed advanced stage pancreatic cancer with biliary obstruction status post covered common bile duct stent placement at outside facility approximally 1 week ago, now with emphysematous/ruptured acute cholecystitis with associated perihepatic fluid collection.   S/p Successful 3F cholecystostomy drain placed 7/17 with 100 mL of bilious/purulent fluid aspirated. S/p Successful 3F RUQ perihepatic fluid  collection drain with aspiration of 500 mL of bilious/purulent fluid 7/17.  Wbc trend down 14.1 (17.7), however still requiring vasopressor support, good drain output, Cx gram variable rod, gram positive cocci- pending  Follow WBC, vitals and fever trend closely. IR will follow along.   Electronically Signed: Berneta Levins, PA-C 10/01/2022, 12:55 PM   I spent a total of 15 Minutes at the the patient's bedside AND on the patient's hospital floor or unit, greater than 50% of which was counseling/coordinating care for ruptured acute cholecystitis.

## 2022-10-01 NOTE — Progress Notes (Signed)
[facility]  ROUNDING NOTE   Subjective:   Gallbladder drain placed.   Creatinine 2.96 (3.05) (3.17)  UOP .   Son at bedside.   Objective:  Vital signs in last 24 hours:  Temp:  [97.4 F (36.3 C)-98.5 F (36.9 C)] 98.5 F (36.9 C) (07/18 0330) Pulse Rate:  [82-142] 104 (07/18 0745) Resp:  [12-31] 19 (07/18 0745) BP: (77-112)/(51-79) 88/57 (07/18 0745) SpO2:  [82 %-100 %] 97 % (07/18 0745) Weight:  [79.5 kg] 79.5 kg (07/18 0500)  Weight change:  Filed Weights   09/26/22 0519 10/01/22 0500  Weight: 73.9 kg 79.5 kg    Intake/Output: I/O last 3 completed shifts: In: 4364.5 [P.O.:460; I.V.:3453.7; NG/GT:200; IV Piggyback:250.8] Out: 4355 [Urine:1772; Emesis/NG output:1650; Drains:933]   Intake/Output this shift:  Total I/O In: 481.3 [I.V.:428.2; IV Piggyback:53] Out: 400 [Urine:230; Emesis/NG output:150; Drains:20]  Physical Exam: General: NAD, cachectic, laying in bed  Head: Normocephalic, atraumatic. Moist oral mucosal membranes  Eyes: Anicteric, PERRL  Neck: Supple, trachea midline  Lungs:  Clear to auscultation  Heart: Regular rate and rhythm  Abdomen:  Soft, +epigastric tenderness  Extremities:  no peripheral edema.  Neurologic: Nonfocal, moving all four extremities  Skin: No lesions  Access: none    Basic Metabolic Panel: Recent Labs  Lab 09/26/22 0655 09/28/22 1607 09/29/22 0949 09/30/22 0334 10/01/22 0448  NA 133* 134* 134* 133* 133*  K 3.4* 4.4 3.7 3.7 3.0*  CL 98 96* 97* 98 98  CO2 25 23 20* 22 22  GLUCOSE 148* 157* 176* 138* 132*  BUN 15 70* 85* 89* 86*  CREATININE 0.97 2.90* 3.17* 3.05* 2.96*  CALCIUM 7.4* 7.3* 7.0* 7.1* 6.9*  MG  --   --  2.4 2.5* 2.5*  PHOS  --   --  8.8* 7.7* 6.7*    Liver Function Tests: Recent Labs  Lab 09/26/22 0655 09/29/22 0949 09/30/22 0334 10/01/22 0448  AST 32 222* 424*  --   ALT 24 106* 174*  --   ALKPHOS 113 192* 277*  --   BILITOT 1.1 1.6* 1.6*  --   PROT 5.4* 5.6* 5.2*  --    ALBUMIN 2.2* 2.0* 1.9* 1.7*   Recent Labs  Lab 09/26/22 0655  LIPASE 19   No results for input(s): "AMMONIA" in the last 168 hours.  CBC: Recent Labs  Lab 09/27/22 1421 09/28/22 1607 09/29/22 0201 09/30/22 0334 10/01/22 0448  WBC 21.3* 18.7* 20.7* 17.7* 14.1*  HGB 14.7 12.9* 12.8* 12.4* 12.1*  HCT 43.4 36.5* 37.3* 34.5* 33.3*  MCV 82.0 79.5* 81.3 77.5* 75.9*  PLT 341 327 333 288 273    Cardiac Enzymes: No results for input(s): "CKTOTAL", "CKMB", "CKMBINDEX", "TROPONINI" in the last 168 hours.  BNP: Invalid input(s): "POCBNP"  CBG: Recent Labs  Lab 09/30/22 1633 09/30/22 1917 09/30/22 2325 10/01/22 0331 10/01/22 0758  GLUCAP 135* 109* 109* 138* 105*    Microbiology: Results for orders placed or performed during the hospital encounter of 09/26/22  MRSA Next Gen by PCR, Nasal     Status: None   Collection Time: 09/28/22  4:12 PM   Specimen: Nasal Mucosa; Nasal Swab  Result Value Ref Range Status   MRSA by PCR Next Gen NOT DETECTED NOT DETECTED Final    Comment: (NOTE) The GeneXpert MRSA Assay (FDA approved for NASAL specimens only), is one component of a comprehensive MRSA colonization surveillance program. It is not intended to diagnose MRSA infection nor to guide or monitor treatment for MRSA infections. Test  performance is not FDA approved in patients less than 77 years old. Performed at [facility], [address] [place], [place] [address]   Aerobic/Anaerobic Culture w Gram Stain (surgical/deep wound)     Status: None (Preliminary result)   Collection Time: 09/30/22 12:51 PM   Specimen: Gallbladder; Abscess  Result Value Ref Range Status   Specimen Description   Final    GALL BLADDER Performed at [facility], [address], [place], [place] [address]    Special Requests   Final    NONE Performed at [facility], [address] [place], [place] [address]    Gram Stain   Final    NO WBC SEEN FEW GRAM VARIABLE  ROD RARE GRAM POSITIVE COCCI IN PAIRS    Culture   Final    CULTURE REINCUBATED FOR BETTER GROWTH Performed at [facility], [address] [place], [place] [address]    Report Status PENDING  Incomplete    Coagulation Studies: Recent Labs    09/29/22 0949 09/30/22 0334  LABPROT 22.5* 23.2*  INR 2.0* 2.0*    Urinalysis: Recent Labs    09/29/22 1205  COLORURINE YELLOW*  LABSPEC 1.019  PHURINE 5.0  GLUCOSEU NEGATIVE  HGBUR SMALL*  BILIRUBINUR NEGATIVE  KETONESUR NEGATIVE  PROTEINUR 30*  NITRITE NEGATIVE  LEUKOCYTESUR NEGATIVE      Imaging: DG Abd 1 View  Result Date: 09/30/2022 CLINICAL DATA:  NG tube placement EXAM: ABDOMEN - 1 VIEW COMPARISON:  CT abdomen/pelvis 1 day prior FINDINGS: The enteric catheter tip and sidehole are in the stomach. The stomach remains significantly distended. Air in the gallbladder lumen and wall is noted consistent with the findings of emphysematous cholecystitis seen on CT from 1 day prior. The free intraperitoneal air seen on that study is not definitely seen on this study. There is gaseous distention of the bowel in the imaged abdomen. A biliary stent is noted. IMPRESSION: 1. Enteric catheter tip and sidehole in the stomach which remains distended. 2. Air in the gallbladder lumen and wall consistent with the findings of emphysematous cholecystitis on the CT study from 1 day prior. The pneumoperitoneum seen on that study is not appreciated on the current study. Electronically Signed   By: Lesia Hausen M.D.   On: 09/30/2022 13:28   CT PERC CHOLECYSTOSTOMY  Result Date: 09/30/2022 INDICATION: 77 year old male with history recently diagnosed advanced stage pancreatic cancer with biliary obstruction status post covered common bile duct stent placement at outside facility approximally 1 week ago, now with emphysematous/ruptured acute cholecystitis with associated perihepatic fluid collection. EXAM: CT PERC DRAIN PERITONEAL ABCESS; CT  PERCUTANEOUS CHOLECYSTOSTOMY COMPARISON:  None Available. MEDICATIONS: The patient is currently admitted to the hospital and receiving intravenous antibiotics. The antibiotics were administered within an appropriate time frame prior to the initiation of the procedure. ANESTHESIA/SEDATION: Moderate (conscious) sedation was employed during this procedure. A total of Versed 0.5 mg and Fentanyl 25 mcg was administered intravenously. Moderate Sedation Time: 16 minutes. The patient's level of consciousness and vital signs were monitored continuously by radiology nursing throughout the procedure under my direct supervision. CONTRAST:  None COMPLICATIONS: None immediate. PROCEDURE: RADIATION DOSE REDUCTION: This exam was performed according to the departmental dose-optimization program which includes automated exposure control, adjustment of the mA and/or kV according to patient size and/or use of iterative reconstruction technique. Informed written consent was obtained from the patient after a discussion of the risks, benefits and alternatives to treatment. The patient was placed supine  on the CT gantry and a pre procedural CT was performed re-demonstrating the known abscess/fluid collection within the gallbladder and perihepatic region. The procedure was planned. A timeout was performed prior to the initiation of the procedure. The right upper quadrant was prepped and draped in the usual sterile fashion. The overlying soft tissues were anesthetized with 1% lidocaine with epinephrine. Appropriate trajectories were planned with the use of a 22 gauge spinal needle. Two separate 18 gauge trocar needles were advanced into the gallbladder as well as into the perihepatic fluid collection and short Amplatz super stiff wires were coiled within the gallbladder and perihepatic fluid collection. Appropriate positioning was confirmed with a limited CT scan. The tracks were serially dilated allowing placement of 2, 10 French  all-purpose drainage catheters. Appropriate positioning was confirmed with a limited postprocedural CT scan. Approximately 100 ml of bilious and purulent fluid was aspirated from the gallbladder in a proximally 500 mL of similar appearing, bilious and purulent fluid was aspirated from the perihepatic fluid collection. The drains were connected to a bulb suction and sutured in place. Dressings were placed. The patient tolerated the procedure well without immediate post procedural complication. IMPRESSION: 1. Successful CT guided placement of a 10 French percutaneous cholecystostomy drain with aspiration of 100 mL of bilious and purulent fluid. Samples were sent to the laboratory as requested by the ordering clinical team. 2. Successful CT guided placement of a 10 French right upper quadrant perihepatic fluid collection drain with aspiration of 500 mL of bilious and purulent fluid. Marliss Coots, MD [facility] Radiology Electronically Signed   By: Marliss Coots M.D.   On: 09/30/2022 13:19   CT GUIDED PERITONEAL/RETROPERITONEAL FLUID DRAIN BY PERC CATH  Result Date: 09/30/2022 INDICATION: 77 year old male with history recently diagnosed advanced stage pancreatic cancer with biliary obstruction status post covered common bile duct stent placement at outside facility approximally 1 week ago, now with emphysematous/ruptured acute cholecystitis with associated perihepatic fluid collection. EXAM: CT PERC DRAIN PERITONEAL ABCESS; CT PERCUTANEOUS CHOLECYSTOSTOMY COMPARISON:  None Available. MEDICATIONS: The patient is currently admitted to the hospital and receiving intravenous antibiotics. The antibiotics were administered within an appropriate time frame prior to the initiation of the procedure. ANESTHESIA/SEDATION: Moderate (conscious) sedation was employed during this procedure. A total of Versed 0.5 mg and Fentanyl 25 mcg was administered intravenously. Moderate  Sedation Time: 16 minutes. The patient's level of consciousness and vital signs were monitored continuously by radiology nursing throughout the procedure under my direct supervision. CONTRAST:  None COMPLICATIONS: None immediate. PROCEDURE: RADIATION DOSE REDUCTION: This exam was performed according to the departmental dose-optimization program which includes automated exposure control, adjustment of the mA and/or kV according to patient size and/or use of iterative reconstruction technique. Informed written consent was obtained from the patient after a discussion of the risks, benefits and alternatives to treatment. The patient was placed supine on the CT gantry and a pre procedural CT was performed re-demonstrating the known abscess/fluid collection within the gallbladder and perihepatic region. The procedure was planned. A timeout was performed prior to the initiation of the procedure. The right upper quadrant was prepped and draped in the usual sterile fashion. The overlying soft tissues were anesthetized with 1% lidocaine with epinephrine. Appropriate trajectories were planned with the use of a 22 gauge spinal needle. Two separate 18 gauge trocar needles were advanced into the gallbladder as well as into the perihepatic fluid collection and short Amplatz super stiff wires were coiled within the gallbladder and  perihepatic fluid collection. Appropriate positioning was confirmed with a limited CT scan. The tracks were serially dilated allowing placement of 2, 10 French all-purpose drainage catheters. Appropriate positioning was confirmed with a limited postprocedural CT scan. Approximately 100 ml of bilious and purulent fluid was aspirated from the gallbladder in a proximally 500 mL of similar appearing, bilious and purulent fluid was aspirated from the perihepatic fluid collection. The drains were connected to a bulb suction and sutured in place. Dressings were placed. The patient tolerated the procedure well  without immediate post procedural complication. IMPRESSION: 1. Successful CT guided placement of a 10 French percutaneous cholecystostomy drain with aspiration of 100 mL of bilious and purulent fluid. Samples were sent to the laboratory as requested by the ordering clinical team. 2. Successful CT guided placement of a 10 French right upper quadrant perihepatic fluid collection drain with aspiration of 500 mL of bilious and purulent fluid. Marliss Coots, MD [facility] Radiology Electronically Signed   By: Marliss Coots M.D.   On: 09/30/2022 13:19   CT ABDOMEN PELVIS WO CONTRAST  Addendum Date: 09/29/2022   ADDENDUM REPORT: 09/29/2022 23:18 ADDENDUM: These results were called by telephone at the time of interpretation on 09/29/2022 at 11:14 pm to provider Dr. Everlene Farrier, who verbally acknowledged these results. Electronically Signed   By: Tish Frederickson M.D.   On: 09/29/2022 23:18   Result Date: 09/29/2022 CLINICAL DATA:  Bowel obstruction suspected EXAM: CT ABDOMEN AND PELVIS WITHOUT CONTRAST TECHNIQUE: Multidetector CT imaging of the abdomen and pelvis was performed following the standard protocol without IV contrast. RADIATION DOSE REDUCTION: This exam was performed according to the departmental dose-optimization program which includes automated exposure control, adjustment of the mA and/or kV according to patient size and/or use of iterative reconstruction technique. COMPARISON:  CT abdomen pelvis 09/26/2022 FINDINGS: Lower chest: Partially visualized bilateral small pleural effusions with bilateral lower lobe passive atelectasis. Hepatobiliary: Redemonstration of a couple of hepatic lesions consistent with known metastases. Air-fluid level within the gastric lumen. Air noted within the wall of the gallbladder. Common bile duct stent in appropriate position with associated pneumobilia. Pancreas: Proximal pancreatic mass again noted in difficult to delineate on this  study. Finding measures approximately 12.6 x 4.7 cm extending along the liver and duodenum. No surrounding inflammatory changes. No main pancreatic ductal dilatation. Spleen: Normal in size without focal abnormality. Adrenals/Urinary Tract: No adrenal nodule bilaterally. No nephrolithiasis and no hydronephrosis. Several fluid density lesions likely represent simple renal cysts. Simple renal cysts, in the absence of clinically indicated signs/symptoms, require no independent follow-up. No ureterolithiasis or hydroureter. The urinary bladder is decompressed with Foley catheter tip and balloon terminating within the lumen. Stomach/Bowel: Stomach distended with air-fluid level. Bowel thickening of the first portion of the duodenum suggestive of possible metastatic invasion. No evidence of large bowel wall thickening or dilatation. Colonic diverticulosis. Appendix appears normal. Vascular/Lymphatic: No abdominal aorta or iliac aneurysm. Moderate atherosclerotic plaque of the aorta and its branches. No abdominal, pelvic, or inguinal lymphadenopathy. Reproductive: Prostate is unremarkable. Other: Interval increase in trace to small volume simple free fluid within the right upper quadrant. Associated free gas. No intraperitoneal free gas. No organized fluid collection. Musculoskeletal: Bilateral inguinal hernias containing fat and fluid. Subcutaneus soft tissue edema. No suspicious lytic or blastic osseous lesions. No acute displaced fracture. Multilevel degenerative changes of the spine. IMPRESSION: 1. Acute emphysematous cholecystitis. Recommend emergent surgical consultation. 2. Interval development of pneumoperitoneum. 3. Interval increase in small volume perihepatic ascites. 4.  Gastric lumen distended with air-fluid level. Developing obstruction along the first portion of duodenum in the region of the pancreatic malignancy not excluded. 5. Metastatic pancreatic malignancy poorly evaluated on this noncontrast study.  Please see CT abdomen pelvis 09/26/2022 for further details. 6. Partially visualized bilateral small pleural effusions with bilateral lower lobe passive atelectasis. 7.  Aortic Atherosclerosis (ICD10-I70.0). These results were called by telephone at the time of interpretation on 09/29/2022 at 10:37 pm to provider Dr. Karna Christmas, who verbally acknowledged these results. Electronically Signed: By: Tish Frederickson M.D. On: 09/29/2022 22:44   US THORACENTESIS ASP PLEURAL SPACE W/IMG GUIDE  Result Date: 09/29/2022 INDICATION: Right pleural effusion.  Pancreatic cancer. EXAM: ULTRASOUND GUIDED RIGHT THORACENTESIS MEDICATIONS: None. COMPLICATIONS: None immediate. PROCEDURE: An ultrasound guided thoracentesis was thoroughly discussed with the patient and questions answered. The benefits, risks, alternatives and complications were also discussed. The patient understands and wishes to proceed with the procedure. Written consent was obtained. Ultrasound was performed to localize and mark an adequate pocket of fluid in the right chest. The area was then prepped and draped in the normal sterile fashion. 1% Lidocaine was used for local anesthesia. Under ultrasound guidance a 6 Fr Safe-T-Centesis catheter was introduced. Thoracentesis was performed. The catheter was removed and a dressing applied. FINDINGS: A total of approximately 800 mL of dark yellow fluid was removed. Samples were sent to the laboratory as requested by the clinical team. Images were also obtained of the right upper abdomen to evaluate the gallbladder. Loculated perihepatic ascites is identified. Previously identified gallbladder is poorly characterized because there appears to be air within the gallbladder. The amount of fluid around the liver has significantly increased since the right upper quadrant ultrasound 06/27/2022. IMPRESSION: 1. Successful ultrasound guided right thoracentesis yielding 800 mL of pleural fluid. 2. Images of the right upper quadrant  demonstrate markedly increased perihepatic ascites. Ascites appears to be complex. In addition, the gallbladder now appears to contain a large amount of gas which is likely related to the biliary stent and pneumobilia. Electronically Signed   By: Richarda Overlie M.D.   On: 09/29/2022 15:52   DG Chest Port 1 View  Result Date: 09/29/2022 CLINICAL DATA:  Pleural effusion.  Status post thoracentesis. EXAM: PORTABLE CHEST 1 VIEW COMPARISON:  Chest radiographs 09/28/2022 and 09/26/2022. Abdominal CT 09/26/2022. FINDINGS: 1417 hours. Interval decreased volume of right pleural effusion. A left pleural effusion and left basilar airspace disease appear unchanged. No pneumothorax. The heart size and mediastinal contours are stable. Moderate gaseous distension of the stomach noted. Curvilinear lucency in the right upper quadrant corresponds with air in the gallbladder lumen on recent CT. Patient has a biliary stent. IMPRESSION: 1. Decreased right pleural effusion following thoracentesis. No pneumothorax. 2. No significant change in left pleural effusion and left basilar airspace disease. Electronically Signed   By: Carey Bullocks M.D.   On: 09/29/2022 15:46   ECHOCARDIOGRAM COMPLETE  Result Date: 09/29/2022    ECHOCARDIOGRAM REPORT   Patient Name:   CHRISTIFER CHAPDELAINE Date of Exam: 09/29/2022 Medical Rec #:  [id number]       Height:       72.0 in Accession #:    [id number]      Weight:       163.0 lb Date of Birth:  1946/02/13       BSA:          1.953 m Patient Age:    76 years        BP:  112/74 mmHg Patient Gender: M               HR:           115 bpm. Exam Location:  [facility] Procedure: 2D Echo, Cardiac Doppler and Color Doppler Indications:     Atrial Fibrillation I48.91  History:         Patient has no prior history of Echocardiogram examinations.                  Arrythmias:Atrial Fibrillation; Risk Factors:Diabetes and                  Hypertension.  Sonographer:     Cristela Blue Referring Phys:  [id number] SONA PATEL  Diagnosing Phys: Yvonne Kendall MD IMPRESSIONS  1. Left ventricular ejection fraction, by estimation, is 60 to 65%. The left ventricle has normal function. The left ventricle has no regional wall motion abnormalities. There is mild asymmetric left ventricular hypertrophy of the basal-septal segment. Left ventricular diastolic function could not be evaluated.  2. Pulmonary artery pressure is mildly elevated (RVSP 30-35 mmHg plus central venous/right atrial pressure). Right ventricular systolic function is normal. The right ventricular size is normal.  3. Moderate pleural effusion in the right lateral region.  4. The mitral valve is normal in structure. Mild to moderate mitral valve regurgitation.  5. Tricuspid valve regurgitation is mild to moderate.  6. The aortic valve is tricuspid. Aortic valve regurgitation is mild. No aortic stenosis is present. FINDINGS  Left Ventricle: Left ventricular ejection fraction, by estimation, is 60 to 65%. The left ventricle has normal function. The left ventricle has no regional wall motion abnormalities. The left ventricular internal cavity size was normal in size. There is  mild asymmetric left ventricular hypertrophy of the basal-septal segment. Left ventricular diastolic function could not be evaluated due to atrial fibrillation. Left ventricular diastolic function could not be evaluated. Right Ventricle: Pulmonary artery pressure is mildly elevated (RVSP 30-35 mmHg plus central venous/right atrial pressure). The right ventricular size is normal. No increase in right ventricular wall thickness. Right ventricular systolic function is normal. Left Atrium: Left atrial size was normal in size. Right Atrium: Right atrial size was normal in size. Pericardium: The pericardium was not well visualized. Mitral Valve: The mitral valve is normal in structure. Mild to moderate mitral valve regurgitation. Tricuspid Valve: The tricuspid valve is normal in structure. Tricuspid valve  regurgitation is mild to moderate. Aortic Valve: The aortic valve is tricuspid. Aortic valve regurgitation is mild. No aortic stenosis is present. Aortic valve mean gradient measures 3.0 mmHg. Aortic valve peak gradient measures 4.7 mmHg. Aortic valve area, by VTI measures 3.27 cm. Pulmonic Valve: The pulmonic valve was grossly normal. Pulmonic valve regurgitation is trivial. No evidence of pulmonic stenosis. Aorta: The aortic root is normal in size and structure. Pulmonary Artery: The pulmonary artery is not well seen. Venous: The inferior vena cava was not well visualized. IAS/Shunts: The interatrial septum was not well visualized. Additional Comments: There is a moderate pleural effusion in the right lateral region.  LEFT VENTRICLE PLAX 2D LVIDd:         3.90 cm LVIDs:         2.60 cm LV PW:         0.90 cm LV IVS:        1.20 cm LVOT diam:     2.20 cm LV SV:         47 LV SV Index:   24  LVOT Area:     3.80 cm  RIGHT VENTRICLE RV Basal diam:  3.50 cm RV Mid diam:    2.80 cm LEFT ATRIUM             Index        RIGHT ATRIUM           Index LA diam:        4.30 cm 2.20 cm/m   RA Area:     16.20 cm LA Vol (A2C):   70.1 ml 35.89 ml/m  RA Volume:   34.00 ml  17.41 ml/m LA Vol (A4C):   47.7 ml 24.42 ml/m LA Biplane Vol: 60.7 ml 31.08 ml/m  AORTIC VALVE AV Area (Vmax):    2.81 cm AV Area (Vmean):   2.73 cm AV Area (VTI):     3.27 cm AV Vmax:           108.00 cm/s AV Vmean:          74.350 cm/s AV VTI:            0.143 m AV Peak Grad:      4.7 mmHg AV Mean Grad:      3.0 mmHg LVOT Vmax:         79.80 cm/s LVOT Vmean:        53.400 cm/s LVOT VTI:          0.123 m LVOT/AV VTI ratio: 0.86  AORTA Ao Root diam: 3.66 cm MITRAL VALVE               TRICUSPID VALVE MV Area (PHT): 3.63 cm    TR Peak grad:   32.9 mmHg MV Decel Time: 209 msec    TR Vmax:        287.00 cm/s MV E velocity: 86.50 cm/s                            SHUNTS                            Systemic VTI:  0.12 m                            Systemic Diam:  2.20 cm Yvonne Kendall MD Electronically signed by Yvonne Kendall MD Signature Date/Time: 09/29/2022/2:31:41 PM    Final    US RENAL  Result Date: 09/29/2022 CLINICAL DATA:  Acute renal failure. EXAM: RENAL / URINARY TRACT ULTRASOUND COMPLETE COMPARISON:  September 26, 2022. FINDINGS: Right Kidney: Renal measurements: 9.6 x 5.1 x 4.7 cm = volume: 122 mL. At least 2 parapelvic cysts are noted, the largest measuring 2.3 cm. Increased echogenicity of renal parenchyma is noted. No mass or hydronephrosis visualized. Left Kidney: Renal measurements: 10.1 x 5.9 x 5.5 cm = volume: 170 mL. Multi septated cyst measuring 2.3 x 2.3 x 1.9 cm is noted. 2.5 cm simple cyst is noted. Increased echogenicity of renal parenchyma is noted. No hydronephrosis visualized. Bladder: Appears normal for degree of bladder distention. Other: None. IMPRESSION: 2.3 cm multi-septated complex cyst seen in left kidney. Follow-up ultrasound in 6-12 months is recommended to ensure stability. Increased echogenicity of renal parenchyma is noted bilaterally suggesting medical renal disease. No hydronephrosis or renal obstruction is noted. Electronically Signed   By: Lupita Raider M.D.   On: 09/29/2022 13:28     Medications:  sodium chloride Stopped (09/29/22 1140)   albumin human     amiodarone 30 mg/hr (10/01/22 0856)   heparin 850 Units/hr (10/01/22 0956)   lactated ringers     Followed by   lactated ringers     phenylephrine (NEO-SYNEPHRINE) Adult infusion 80 mcg/min (10/01/22 0920)   piperacillin-tazobactam (ZOSYN)  IV Stopped (10/01/22 0846)   potassium chloride 10 mEq (10/01/22 0954)   promethazine (PHENERGAN) injection (IM or IVPB)      atorvastatin  40 mg Oral q morning   calcium acetate  1,334 mg Oral TID WC   Chlorhexidine Gluconate Cloth  6 each Topical QHS   docusate sodium  100 mg Oral BID   feeding supplement  237 mL Oral TID BM   insulin aspart  0-9 Units Subcutaneous Q4H   multivitamin with minerals  1 tablet  Oral Daily   sodium chloride flush  5 mL Intracatheter Q8H   fentaNYL (SUBLIMAZE) injection, iohexol, levalbuterol, metoprolol tartrate, ondansetron **OR** ondansetron (ZOFRAN) IV, oxyCODONE, polyethylene glycol, promethazine (PHENERGAN) injection (IM or IVPB), traMADol, traZODone  Assessment/ Plan:  Mr. HASAAN RADDE is a 77 y.o.  male  with atrial fibrillation, pancreatic cancer, diabetes mellitus type II, hyperlipidemia, hypertension, who was admitted to [facility] on 09/26/2022 for Generalized abdominal pain [R10.84] Adenocarcinoma of pancreas (HCC) [C25.9] Primary pancreatic cancer with metastasis to other site [facility]) [C25.9]  Nephrology consulted for acute kidney injury.    Acute Kidney Injury: baseline creatinine of 0.97 with GFR >60 on admission, 7/13. VI contrast exposure on 7/13. Suspect IV contrast induced nephropathy plus prerenal azotemia. - creatinine slowly improving. Nonoliguric urine output.  - holding home lisinopril - Continue IV fluids: start NS at 33mL/hr   Hypotension: placed on phenylephrine gtt.  - holding home regimen of amlodipine and lisinopril.    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 history of glycosuria.  Hemoglobin A1c of 7% on admission.  - continue glucose control.    LOS: 4 Teejay Meader 7/18/202410:29 AM

## 2022-10-01 NOTE — Consult Note (Signed)
PHARMACY CONSULT NOTE - FOLLOW UP  Pharmacy Consult for Electrolyte Monitoring and Replacement   Recent Labs: Potassium (mmol/L)  Date Value  10/01/2022 3.0 (L)  09/24/2013 3.8   Magnesium (mg/dL)  Date Value  40/98/1191 2.5 (H)   Calcium (mg/dL)  Date Value  47/82/9562 6.9 (L)   Calcium, Total (mg/dL)  Date Value  13/10/6576 8.8   Albumin (g/dL)  Date Value  46/96/2952 1.7 (L)   Phosphorus (mg/dL)  Date Value  84/13/2440 6.7 (H)   Sodium (mmol/L)  Date Value  10/01/2022 133 (L)  04/09/2021 141  09/24/2013 141   Corrected Calcium: 8.74  Assessment: 77 y.o. male with recently diagnosed advanced stage pancreatic cancer complicated by common bile duct stenosis status post ERCP with covered common bile duct stent placement on 09/23/22 at [facility].  He presented to the [facility] ED 2 days later with abdominal pain and dyspnea.  Imaging has shown progressive cholecystitis, now with possible emphysematous changes and perforation with perihepatic fluid.  With this, he has progressed in septic shock and multiorgan failure. Pharmacy has been consulted to monitor and replace electrolytes while under PCCM care.  Goal of Therapy:  Electrolytes WNL  Plan:  K 3.0: Kcl x 4 doses Phos 6.7: due to AKI. Treatment to be decided by Nephrology/CCM team Will recheck electrolytes with AM labs   Bettey Costa ,PharmD Clinical Pharmacist 10/01/2022 8:02 AM

## 2022-10-01 NOTE — Progress Notes (Signed)
Palliative Medicine [facility] at [facility] [phone] [phone]   Name: Cameron Gutierrez Date: 10/01/2022 MRN: [id number]  DOB: 03-10-1946  Patient Care Team: Dondra Spry, MD as PCP - General (Internal Medicine) Clinic, General Medical (Family Medicine)    REASON FOR CONSULTATION: Cameron Gutierrez is a 77 y.o. male with multiple medical problems including diabetes, hypertension, CKD stage II, A-fib on Eliquis, who was recently hospitalized at [facility] 09/21/2022 to 09/25/2022 for abdominal pain and weight loss with workup consistent with metastatic adenocarcinoma of the pancreas. Patient readmitted here on 09/26/2022 with intractable abdominal pain. Was found to have acalculus cholecystitis. Hospitalization has been complicated by A-fib. Palliative care was consulted to address goals and manage ongoing symptoms. .    CODE STATUS: DNR  PAST MEDICAL HISTORY: Past Medical History:  Diagnosis Date   Atrial fibrillation (HCC)    Cancer (HCC)    pancreatic   Diabetes mellitus without complication (HCC)    Hypercholesteremia    Hypertension     PAST SURGICAL HISTORY:  Past Surgical History:  Procedure Laterality Date   KNEE SURGERY     REPLACEMENT TOTAL KNEE Right    SHOULDER SURGERY      HEMATOLOGY/ONCOLOGY HISTORY:  Oncology History   No history exists.    ALLERGIES:  has No Known Allergies.  MEDICATIONS:  Current Facility-Administered Medications  Medication Dose Route Frequency Provider Last Rate Last Admin   0.9 %  sodium chloride infusion  250 mL Intravenous Continuous Ezequiel Essex, NP   Held at 09/29/22 1140   albumin human 25 % solution 25 g  25 g Intravenous Daily Vida Rigger, MD       amiodarone (NEXTERONE PREMIX) 360-4.14 MG/200ML-% (1.8 mg/mL) IV infusion  30 mg/hr Intravenous Continuous Enedina Finner, MD 16.67 mL/hr at 10/01/22 0856 30 mg/hr at 10/01/22 0856   atorvastatin (LIPITOR) tablet 40 mg  40 mg Oral q  morning Enedina Finner, MD   40 mg at 09/30/22 0831   calcium acetate (PHOSLO) capsule 1,334 mg  1,334 mg Oral TID WC Vida Rigger, MD   1,334 mg at 09/29/22 1755   Chlorhexidine Gluconate Cloth 2 % PADS 6 each  6 each Topical QHS Enedina Finner, MD   6 each at 09/30/22 2135   docusate sodium (COLACE) capsule 100 mg  100 mg Oral BID Enedina Finner, MD   100 mg at 09/26/22 2243   feeding supplement (ENSURE ENLIVE / ENSURE PLUS) liquid 237 mL  237 mL Oral TID BM Enedina Finner, MD   237 mL at 09/26/22 2247   fentaNYL (SUBLIMAZE) injection 50 mcg  50 mcg Intravenous Q4H PRN Enedina Finner, MD   50 mcg at 09/30/22 1639   heparin ADULT infusion 100 units/mL (25000 units/232mL)  850 Units/hr Intravenous Continuous Nazari, Walid A, RPH 8.5 mL/hr at 10/01/22 0956 850 Units/hr at 10/01/22 0956   insulin aspart (novoLOG) injection 0-9 Units  0-9 Units Subcutaneous Q4H Rust-Chester, Britton L, NP   1 Units at 10/01/22 0334   iohexol (OMNIPAQUE) 9 MG/ML oral solution 500 mL  500 mL Oral Once PRN Ezequiel Essex, NP   500 mL at 09/29/22 1616   lactated ringers bolus 1,000 mL  1,000 mL Intravenous Once Vida Rigger, MD       Followed by   lactated ringers bolus 1,000 mL  1,000 mL Intravenous Once Vida Rigger, MD       levalbuterol Pauline Aus) nebulizer solution 0.63 mg  0.63 mg  Nebulization Q6H PRN Ezequiel Essex, NP       metoprolol tartrate (LOPRESSOR) injection 5 mg  5 mg Intravenous Q4H PRN Enedina Finner, MD   5 mg at 09/28/22 1456   multivitamin with minerals tablet 1 tablet  1 tablet Oral Daily Enedina Finner, MD   1 tablet at 09/30/22 0831   ondansetron (ZOFRAN) tablet 4 mg  4 mg Oral Q6H PRN Enedina Finner, MD       Or   ondansetron [facility]) injection 4 mg  4 mg Intravenous Q6H PRN Enedina Finner, MD   4 mg at 09/28/22 1638   oxyCODONE (Oxy IR/ROXICODONE) immediate release tablet 10 mg  10 mg Oral Q4H PRN Enedina Finner, MD   10 mg at 09/29/22 2215   phenylephrine (NEO-SYNEPHRINE) 20mg /NS premix infusion  25-200  mcg/min Intravenous Titrated Ezequiel Essex, NP 60 mL/hr at 10/01/22 0920 80 mcg/min at 10/01/22 0920   piperacillin-tazobactam (ZOSYN) IVPB 3.375 g  3.375 g Intravenous Q8H Rust-Chester, Cecelia Byars, NP   Stopped at 10/01/22 0846   polyethylene glycol (MIRALAX / GLYCOLAX) packet 17 g  17 g Oral Daily PRN Enedina Finner, MD       potassium chloride 10 mEq in 100 mL IVPB  10 mEq Intravenous Q1 Hr x 4 Nazari, Walid A, RPH 100 mL/hr at 10/01/22 0954 10 mEq at 10/01/22 0954   promethazine (PHENERGAN) 12.5 mg in sodium chloride 0.9 % 50 mL IVPB  12.5 mg Intravenous Q6H PRN Enedina Finner, MD       sodium chloride flush (NS) 0.9 % injection 5 mL  5 mL Intracatheter Q8H Suttle, Thressa Sheller, MD   5 mL at 10/01/22 0506   traMADol (ULTRAM) tablet 50 mg  50 mg Oral Q6H PRN Enedina Finner, MD   50 mg at 09/27/22 2252   traZODone (DESYREL) tablet 25 mg  25 mg Oral QHS PRN Enedina Finner, MD   25 mg at 09/27/22 2253    VITAL SIGNS: BP (!) 88/57   Pulse (!) 104   Temp 98.5 F (36.9 C) (Axillary)   Resp 19   Ht 6' (1.829 m)   Wt 175 lb 4.3 oz (79.5 kg)   SpO2 97%   BMI 23.77 kg/m  Filed Weights   09/26/22 0519 10/01/22 0500  Weight: 163 lb (73.9 kg) 175 lb 4.3 oz (79.5 kg)    Estimated body mass index is 23.77 kg/m as calculated from the following:   Height as of this encounter: 6' (1.829 m).   Weight as of this encounter: 175 lb 4.3 oz (79.5 kg).  LABS: CBC:    Component Value Date/Time   WBC 14.1 (H) 10/01/2022 0448   HGB 12.1 (L) 10/01/2022 0448   HGB 14.6 09/24/2013 0122   HCT 33.3 (L) 10/01/2022 0448   HCT 43.7 09/24/2013 0122   PLT 273 10/01/2022 0448   PLT 170 09/24/2013 0122   MCV 75.9 (L) 10/01/2022 0448   MCV 87 09/24/2013 0122   NEUTROABS 5.5 09/24/2013 0122   LYMPHSABS 1.4 09/24/2013 0122   MONOABS 0.7 09/24/2013 0122   EOSABS 0.3 09/24/2013 0122   BASOSABS 0.0 09/24/2013 0122   Comprehensive Metabolic Panel:    Component Value Date/Time   NA 133 (L) 10/01/2022 0448   NA 141 04/09/2021  1427   NA 141 09/24/2013 0122   K 3.0 (L) 10/01/2022 0448   K 3.8 09/24/2013 0122   CL 98 10/01/2022 0448   CL 105 09/24/2013 0122   CO2 22  10/01/2022 0448   CO2 28 09/24/2013 0122   BUN 86 (H) 10/01/2022 0448   BUN 24 04/09/2021 1427   BUN 29 (H) 09/24/2013 0122   CREATININE 2.96 (H) 10/01/2022 0448   CREATININE 1.50 (H) 09/24/2013 0122   GLUCOSE 132 (H) 10/01/2022 0448   GLUCOSE 127 (H) 09/24/2013 0122   CALCIUM 6.9 (L) 10/01/2022 0448   CALCIUM 8.8 09/24/2013 0122   AST 424 (H) 09/30/2022 0334   ALT 174 (H) 09/30/2022 0334   ALKPHOS 277 (H) 09/30/2022 0334   BILITOT 1.6 (H) 09/30/2022 0334   PROT 5.2 (L) 09/30/2022 0334   ALBUMIN 1.7 (L) 10/01/2022 0448    RADIOGRAPHIC STUDIES: DG Abd 1 View  Result Date: 09/30/2022 CLINICAL DATA:  NG tube placement EXAM: ABDOMEN - 1 VIEW COMPARISON:  CT abdomen/pelvis 1 day prior FINDINGS: The enteric catheter tip and sidehole are in the stomach. The stomach remains significantly distended. Air in the gallbladder lumen and wall is noted consistent with the findings of emphysematous cholecystitis seen on CT from 1 day prior. The free intraperitoneal air seen on that study is not definitely seen on this study. There is gaseous distention of the bowel in the imaged abdomen. A biliary stent is noted. IMPRESSION: 1. Enteric catheter tip and sidehole in the stomach which remains distended. 2. Air in the gallbladder lumen and wall consistent with the findings of emphysematous cholecystitis on the CT study from 1 day prior. The pneumoperitoneum seen on that study is not appreciated on the current study. Electronically Signed   By: Lesia Hausen M.D.   On: 09/30/2022 13:28   CT PERC CHOLECYSTOSTOMY  Result Date: 09/30/2022 INDICATION: 77 year old male with history recently diagnosed advanced stage pancreatic cancer with biliary obstruction status post covered common bile duct stent placement at outside facility approximally 1 week ago, now with  emphysematous/ruptured acute cholecystitis with associated perihepatic fluid collection. EXAM: CT PERC DRAIN PERITONEAL ABCESS; CT PERCUTANEOUS CHOLECYSTOSTOMY COMPARISON:  None Available. MEDICATIONS: The patient is currently admitted to the hospital and receiving intravenous antibiotics. The antibiotics were administered within an appropriate time frame prior to the initiation of the procedure. ANESTHESIA/SEDATION: Moderate (conscious) sedation was employed during this procedure. A total of Versed 0.5 mg and Fentanyl 25 mcg was administered intravenously. Moderate Sedation Time: 16 minutes. The patient's level of consciousness and vital signs were monitored continuously by radiology nursing throughout the procedure under my direct supervision. CONTRAST:  None COMPLICATIONS: None immediate. PROCEDURE: RADIATION DOSE REDUCTION: This exam was performed according to the departmental dose-optimization program which includes automated exposure control, adjustment of the mA and/or kV according to patient size and/or use of iterative reconstruction technique. Informed written consent was obtained from the patient after a discussion of the risks, benefits and alternatives to treatment. The patient was placed supine on the CT gantry and a pre procedural CT was performed re-demonstrating the known abscess/fluid collection within the gallbladder and perihepatic region. The procedure was planned. A timeout was performed prior to the initiation of the procedure. The right upper quadrant was prepped and draped in the usual sterile fashion. The overlying soft tissues were anesthetized with 1% lidocaine with epinephrine. Appropriate trajectories were planned with the use of a 22 gauge spinal needle. Two separate 18 gauge trocar needles were advanced into the gallbladder as well as into the perihepatic fluid collection and short Amplatz super stiff wires were coiled within the gallbladder and perihepatic fluid collection.  Appropriate positioning was confirmed with a limited CT scan. The tracks were  serially dilated allowing placement of 2, 10 French all-purpose drainage catheters. Appropriate positioning was confirmed with a limited postprocedural CT scan. Approximately 100 ml of bilious and purulent fluid was aspirated from the gallbladder in a proximally 500 mL of similar appearing, bilious and purulent fluid was aspirated from the perihepatic fluid collection. The drains were connected to a bulb suction and sutured in place. Dressings were placed. The patient tolerated the procedure well without immediate post procedural complication. IMPRESSION: 1. Successful CT guided placement of a 10 French percutaneous cholecystostomy drain with aspiration of 100 mL of bilious and purulent fluid. Samples were sent to the laboratory as requested by the ordering clinical team. 2. Successful CT guided placement of a 10 French right upper quadrant perihepatic fluid collection drain with aspiration of 500 mL of bilious and purulent fluid. Marliss Coots, MD [facility] Electronically Signed   By: Marliss Coots M.D.   On: 09/30/2022 13:19   CT GUIDED PERITONEAL/RETROPERITONEAL FLUID DRAIN BY PERC CATH  Result Date: 09/30/2022 INDICATION: 77 year old male with history recently diagnosed advanced stage pancreatic cancer with biliary obstruction status post covered common bile duct stent placement at outside facility approximally 1 week ago, now with emphysematous/ruptured acute cholecystitis with associated perihepatic fluid collection. EXAM: CT PERC DRAIN PERITONEAL ABCESS; CT PERCUTANEOUS CHOLECYSTOSTOMY COMPARISON:  None Available. MEDICATIONS: The patient is currently admitted to the hospital and receiving intravenous antibiotics. The antibiotics were administered within an appropriate time frame prior to the initiation of the procedure. ANESTHESIA/SEDATION: Moderate (conscious)  sedation was employed during this procedure. A total of Versed 0.5 mg and Fentanyl 25 mcg was administered intravenously. Moderate Sedation Time: 16 minutes. The patient's level of consciousness and vital signs were monitored continuously by radiology nursing throughout the procedure under my direct supervision. CONTRAST:  None COMPLICATIONS: None immediate. PROCEDURE: RADIATION DOSE REDUCTION: This exam was performed according to the departmental dose-optimization program which includes automated exposure control, adjustment of the mA and/or kV according to patient size and/or use of iterative reconstruction technique. Informed written consent was obtained from the patient after a discussion of the risks, benefits and alternatives to treatment. The patient was placed supine on the CT gantry and a pre procedural CT was performed re-demonstrating the known abscess/fluid collection within the gallbladder and perihepatic region. The procedure was planned. A timeout was performed prior to the initiation of the procedure. The right upper quadrant was prepped and draped in the usual sterile fashion. The overlying soft tissues were anesthetized with 1% lidocaine with epinephrine. Appropriate trajectories were planned with the use of a 22 gauge spinal needle. Two separate 18 gauge trocar needles were advanced into the gallbladder as well as into the perihepatic fluid collection and short Amplatz super stiff wires were coiled within the gallbladder and perihepatic fluid collection. Appropriate positioning was confirmed with a limited CT scan. The tracks were serially dilated allowing placement of 2, 10 French all-purpose drainage catheters. Appropriate positioning was confirmed with a limited postprocedural CT scan. Approximately 100 ml of bilious and purulent fluid was aspirated from the gallbladder in a proximally 500 mL of similar appearing, bilious and purulent fluid was aspirated from the perihepatic fluid collection.  The drains were connected to a bulb suction and sutured in place. Dressings were placed. The patient tolerated the procedure well without immediate post procedural complication. IMPRESSION: 1. Successful CT guided placement of a 10 French percutaneous cholecystostomy drain with aspiration of 100 mL of bilious and purulent fluid. Samples were sent  to the laboratory as requested by the ordering clinical team. 2. Successful CT guided placement of a 10 French right upper quadrant perihepatic fluid collection drain with aspiration of 500 mL of bilious and purulent fluid. Marliss Coots, MD [facility] Electronically Signed   By: Marliss Coots M.D.   On: 09/30/2022 13:19   CT ABDOMEN PELVIS WO CONTRAST  Addendum Date: 09/29/2022   ADDENDUM REPORT: 09/29/2022 23:18 ADDENDUM: These results were called by telephone at the time of interpretation on 09/29/2022 at 11:14 pm to provider Dr. Everlene Farrier, who verbally acknowledged these results. Electronically Signed   By: Tish Frederickson M.D.   On: 09/29/2022 23:18   Result Date: 09/29/2022 CLINICAL DATA:  Bowel obstruction suspected EXAM: CT ABDOMEN AND PELVIS WITHOUT CONTRAST TECHNIQUE: Multidetector CT imaging of the abdomen and pelvis was performed following the standard protocol without IV contrast. RADIATION DOSE REDUCTION: This exam was performed according to the departmental dose-optimization program which includes automated exposure control, adjustment of the mA and/or kV according to patient size and/or use of iterative reconstruction technique. COMPARISON:  CT abdomen pelvis 09/26/2022 FINDINGS: Lower chest: Partially visualized bilateral small pleural effusions with bilateral lower lobe passive atelectasis. Hepatobiliary: Redemonstration of a couple of hepatic lesions consistent with known metastases. Air-fluid level within the gastric lumen. Air noted within the wall of the gallbladder. Common bile duct stent in  appropriate position with associated pneumobilia. Pancreas: Proximal pancreatic mass again noted in difficult to delineate on this study. Finding measures approximately 12.6 x 4.7 cm extending along the liver and duodenum. No surrounding inflammatory changes. No main pancreatic ductal dilatation. Spleen: Normal in size without focal abnormality. Adrenals/Urinary Tract: No adrenal nodule bilaterally. No nephrolithiasis and no hydronephrosis. Several fluid density lesions likely represent simple renal cysts. Simple renal cysts, in the absence of clinically indicated signs/symptoms, require no independent follow-up. No ureterolithiasis or hydroureter. The urinary bladder is decompressed with Foley catheter tip and balloon terminating within the lumen. Stomach/Bowel: Stomach distended with air-fluid level. Bowel thickening of the first portion of the duodenum suggestive of possible metastatic invasion. No evidence of large bowel wall thickening or dilatation. Colonic diverticulosis. Appendix appears normal. Vascular/Lymphatic: No abdominal aorta or iliac aneurysm. Moderate atherosclerotic plaque of the aorta and its branches. No abdominal, pelvic, or inguinal lymphadenopathy. Reproductive: Prostate is unremarkable. Other: Interval increase in trace to small volume simple free fluid within the right upper quadrant. Associated free gas. No intraperitoneal free gas. No organized fluid collection. Musculoskeletal: Bilateral inguinal hernias containing fat and fluid. Subcutaneus soft tissue edema. No suspicious lytic or blastic osseous lesions. No acute displaced fracture. Multilevel degenerative changes of the spine. IMPRESSION: 1. Acute emphysematous cholecystitis. Recommend emergent surgical consultation. 2. Interval development of pneumoperitoneum. 3. Interval increase in small volume perihepatic ascites. 4. Gastric lumen distended with air-fluid level. Developing obstruction along the first portion of duodenum in the  region of the pancreatic malignancy not excluded. 5. Metastatic pancreatic malignancy poorly evaluated on this noncontrast study. Please see CT abdomen pelvis 09/26/2022 for further details. 6. Partially visualized bilateral small pleural effusions with bilateral lower lobe passive atelectasis. 7.  Aortic Atherosclerosis (ICD10-I70.0). These results were called by telephone at the time of interpretation on 09/29/2022 at 10:37 pm to provider Dr. Karna Christmas, who verbally acknowledged these results. Electronically Signed: By: Tish Frederickson M.D. On: 09/29/2022 22:44   US THORACENTESIS ASP PLEURAL SPACE W/IMG GUIDE  Result Date: 09/29/2022 INDICATION: Right pleural effusion.  Pancreatic cancer. EXAM: ULTRASOUND GUIDED RIGHT THORACENTESIS  MEDICATIONS: None. COMPLICATIONS: None immediate. PROCEDURE: An ultrasound guided thoracentesis was thoroughly discussed with the patient and questions answered. The benefits, risks, alternatives and complications were also discussed. The patient understands and wishes to proceed with the procedure. Written consent was obtained. Ultrasound was performed to localize and mark an adequate pocket of fluid in the right chest. The area was then prepped and draped in the normal sterile fashion. 1% Lidocaine was used for local anesthesia. Under ultrasound guidance a 6 Fr Safe-T-Centesis catheter was introduced. Thoracentesis was performed. The catheter was removed and a dressing applied. FINDINGS: A total of approximately 800 mL of dark yellow fluid was removed. Samples were sent to the laboratory as requested by the clinical team. Images were also obtained of the right upper abdomen to evaluate the gallbladder. Loculated perihepatic ascites is identified. Previously identified gallbladder is poorly characterized because there appears to be air within the gallbladder. The amount of fluid around the liver has significantly increased since the right upper quadrant ultrasound 06/27/2022.  IMPRESSION: 1. Successful ultrasound guided right thoracentesis yielding 800 mL of pleural fluid. 2. Images of the right upper quadrant demonstrate markedly increased perihepatic ascites. Ascites appears to be complex. In addition, the gallbladder now appears to contain a large amount of gas which is likely related to the biliary stent and pneumobilia. Electronically Signed   By: Richarda Overlie M.D.   On: 09/29/2022 15:52   DG Chest Port 1 View  Result Date: 09/29/2022 CLINICAL DATA:  Pleural effusion.  Status post thoracentesis. EXAM: PORTABLE CHEST 1 VIEW COMPARISON:  Chest radiographs 09/28/2022 and 09/26/2022. Abdominal CT 09/26/2022. FINDINGS: 1417 hours. Interval decreased volume of right pleural effusion. A left pleural effusion and left basilar airspace disease appear unchanged. No pneumothorax. The heart size and mediastinal contours are stable. Moderate gaseous distension of the stomach noted. Curvilinear lucency in the right upper quadrant corresponds with air in the gallbladder lumen on recent CT. Patient has a biliary stent. IMPRESSION: 1. Decreased right pleural effusion following thoracentesis. No pneumothorax. 2. No significant change in left pleural effusion and left basilar airspace disease. Electronically Signed   By: Carey Bullocks M.D.   On: 09/29/2022 15:46   ECHOCARDIOGRAM COMPLETE  Result Date: 09/29/2022    ECHOCARDIOGRAM REPORT   Patient Name:   BRYCESON GRAPE Date of Exam: 09/29/2022 Medical Rec #:  [id number]       Height:       72.0 in Accession #:    [id number]      Weight:       163.0 lb Date of Birth:  Nov 03, 1945       BSA:          1.953 m Patient Age:    76 years        BP:           112/74 mmHg Patient Gender: M               HR:           115 bpm. Exam Location:  [facility] Procedure: 2D Echo, Cardiac Doppler and Color Doppler Indications:     Atrial Fibrillation I48.91  History:         Patient has no prior history of Echocardiogram examinations.                  Arrythmias:Atrial  Fibrillation; Risk Factors:Diabetes and                  Hypertension.  Sonographer:  Cristela Blue Referring Phys:  [id number] SONA PATEL Diagnosing Phys: Cristal Deer End MD IMPRESSIONS  1. Left ventricular ejection fraction, by estimation, is 60 to 65%. The left ventricle has normal function. The left ventricle has no regional wall motion abnormalities. There is mild asymmetric left ventricular hypertrophy of the basal-septal segment. Left ventricular diastolic function could not be evaluated.  2. Pulmonary artery pressure is mildly elevated (RVSP 30-35 mmHg plus central venous/right atrial pressure). Right ventricular systolic function is normal. The right ventricular size is normal.  3. Moderate pleural effusion in the right lateral region.  4. The mitral valve is normal in structure. Mild to moderate mitral valve regurgitation.  5. Tricuspid valve regurgitation is mild to moderate.  6. The aortic valve is tricuspid. Aortic valve regurgitation is mild. No aortic stenosis is present. FINDINGS  Left Ventricle: Left ventricular ejection fraction, by estimation, is 60 to 65%. The left ventricle has normal function. The left ventricle has no regional wall motion abnormalities. The left ventricular internal cavity size was normal in size. There is  mild asymmetric left ventricular hypertrophy of the basal-septal segment. Left ventricular diastolic function could not be evaluated due to atrial fibrillation. Left ventricular diastolic function could not be evaluated. Right Ventricle: Pulmonary artery pressure is mildly elevated (RVSP 30-35 mmHg plus central venous/right atrial pressure). The right ventricular size is normal. No increase in right ventricular wall thickness. Right ventricular systolic function is normal. Left Atrium: Left atrial size was normal in size. Right Atrium: Right atrial size was normal in size. Pericardium: The pericardium was not well visualized. Mitral Valve: The mitral valve is normal in structure.  Mild to moderate mitral valve regurgitation. Tricuspid Valve: The tricuspid valve is normal in structure. Tricuspid valve regurgitation is mild to moderate. Aortic Valve: The aortic valve is tricuspid. Aortic valve regurgitation is mild. No aortic stenosis is present. Aortic valve mean gradient measures 3.0 mmHg. Aortic valve peak gradient measures 4.7 mmHg. Aortic valve area, by VTI measures 3.27 cm. Pulmonic Valve: The pulmonic valve was grossly normal. Pulmonic valve regurgitation is trivial. No evidence of pulmonic stenosis. Aorta: The aortic root is normal in size and structure. Pulmonary Artery: The pulmonary artery is not well seen. Venous: The inferior vena cava was not well visualized. IAS/Shunts: The interatrial septum was not well visualized. Additional Comments: There is a moderate pleural effusion in the right lateral region.  LEFT VENTRICLE PLAX 2D LVIDd:         3.90 cm LVIDs:         2.60 cm LV PW:         0.90 cm LV IVS:        1.20 cm LVOT diam:     2.20 cm LV SV:         47 LV SV Index:   24 LVOT Area:     3.80 cm  RIGHT VENTRICLE RV Basal diam:  3.50 cm RV Mid diam:    2.80 cm LEFT ATRIUM             Index        RIGHT ATRIUM           Index LA diam:        4.30 cm 2.20 cm/m   RA Area:     16.20 cm LA Vol (A2C):   70.1 ml 35.89 ml/m  RA Volume:   34.00 ml  17.41 ml/m LA Vol (A4C):   47.7 ml 24.42 ml/m LA Biplane Vol: 60.7 ml 31.08  ml/m  AORTIC VALVE AV Area (Vmax):    2.81 cm AV Area (Vmean):   2.73 cm AV Area (VTI):     3.27 cm AV Vmax:           108.00 cm/s AV Vmean:          74.350 cm/s AV VTI:            0.143 m AV Peak Grad:      4.7 mmHg AV Mean Grad:      3.0 mmHg LVOT Vmax:         79.80 cm/s LVOT Vmean:        53.400 cm/s LVOT VTI:          0.123 m LVOT/AV VTI ratio: 0.86  AORTA Ao Root diam: 3.66 cm MITRAL VALVE               TRICUSPID VALVE MV Area (PHT): 3.63 cm    TR Peak grad:   32.9 mmHg MV Decel Time: 209 msec    TR Vmax:        287.00 cm/s MV E velocity: 86.50 cm/s                             SHUNTS                            Systemic VTI:  0.12 m                            Systemic Diam: 2.20 cm Yvonne Kendall MD Electronically signed by Yvonne Kendall MD Signature Date/Time: 09/29/2022/2:31:41 PM    Final    US RENAL  Result Date: 09/29/2022 CLINICAL DATA:  Acute renal failure. EXAM: RENAL / URINARY TRACT ULTRASOUND COMPLETE COMPARISON:  September 26, 2022. FINDINGS: Right Kidney: Renal measurements: 9.6 x 5.1 x 4.7 cm = volume: 122 mL. At least 2 parapelvic cysts are noted, the largest measuring 2.3 cm. Increased echogenicity of renal parenchyma is noted. No mass or hydronephrosis visualized. Left Kidney: Renal measurements: 10.1 x 5.9 x 5.5 cm = volume: 170 mL. Multi septated cyst measuring 2.3 x 2.3 x 1.9 cm is noted. 2.5 cm simple cyst is noted. Increased echogenicity of renal parenchyma is noted. No hydronephrosis visualized. Bladder: Appears normal for degree of bladder distention. Other: None. IMPRESSION: 2.3 cm multi-septated complex cyst seen in left kidney. Follow-up ultrasound in 6-12 months is recommended to ensure stability. Increased echogenicity of renal parenchyma is noted bilaterally suggesting medical renal disease. No hydronephrosis or renal obstruction is noted. Electronically Signed   By: Lupita Raider M.D.   On: 09/29/2022 13:28   DG Chest Port 1 View  Result Date: 09/28/2022 CLINICAL DATA:  Shortness of breath. EXAM: PORTABLE CHEST 1 VIEW COMPARISON:  September 26, 2022 FINDINGS: Calcific atherosclerotic disease of the aorta. Cardiomediastinal silhouette is enlarged. Mediastinal contours appear intact. Bilateral pleural effusions, right greater than left. No focal airspace consolidation. Osseous structures are without acute abnormality. Soft tissues are grossly normal. IMPRESSION: 1. Bilateral pleural effusions, right greater than left. 2. Enlarged cardiomediastinal silhouette. Electronically Signed   By: Ted Mcalpine M.D.   On: 09/28/2022 15:37    US Abdomen Limited RUQ (LIVER/GB)  Result Date: 09/26/2022 CLINICAL DATA:  Abdominal pain. History of metastatic pancreatic cancer. EXAM: ULTRASOUND ABDOMEN LIMITED RIGHT UPPER QUADRANT COMPARISON:  CT abdomen pelvis  from same day. FINDINGS: Gallbladder: No gallstones. Sludge with mild gallbladder wall thickening and small volume pericholecystic fluid. Positive sonographic Murphy sign noted by sonographer. Common bile duct: Diameter: 4 mm, normal.  Common bile duct stent noted. Liver: Liver lesions seen on CT are not well identified by ultrasound. Within normal limits in parenchymal echogenicity. Portal vein is patent on color Doppler imaging with normal direction of blood flow towards the liver. Other: None. IMPRESSION: 1. Gallbladder sludge with mild gallbladder wall thickening and small volume pericholecystic fluid. Positive sonographic Murphy sign. Findings are concerning for acute acalculous cholecystitis. 2. Liver metastases seen on CT are not well identified by ultrasound. Electronically Signed   By: Obie Dredge M.D.   On: 09/26/2022 14:56   CT ABDOMEN PELVIS W CONTRAST  Result Date: 09/26/2022 CLINICAL DATA:  Central chest pain. Abdominal pain. History of pancreatic cancer. EXAM: CT ABDOMEN AND PELVIS WITH CONTRAST TECHNIQUE: Multidetector CT imaging of the abdomen and pelvis was performed using the standard protocol following bolus administration of intravenous contrast. RADIATION DOSE REDUCTION: This exam was performed according to the departmental dose-optimization program which includes automated exposure control, adjustment of the mA and/or kV according to patient size and/or use of iterative reconstruction technique. CONTRAST:  80mL OMNIPAQUE IOHEXOL 300 MG/ML  SOLN COMPARISON:  Chest radiograph 09/26/2022. Outside CTs from [facility] dated 09/21/2022 and 09/22/2022. Outside images are unavailable but reports are available. FINDINGS: Lower chest: Multiple small pulmonary nodules in the  visualized lungs. Index pulmonary nodule is in the right lower lobe along the right major fissure on image 33/4 measuring up to 1.3 cm. There also may be a larger nodule in the left lower lobe measuring 1.4 cm image 15/4. Volume loss and compressive atelectasis in the left lower lobe. Compressive atelectasis in the right lower lobe. Bilateral small pleural effusions. Hepatobiliary: Multiple ill-defined hypoechoic liver lesions compatible with metastatic disease. Index lesion in the left hepatic lobe measuring 2.1 cm on image 23/2. There is gas and contrast within the gallbladder. The gallbladder is moderately distended and findings compatible with recent ERCP. Metallic biliary stent in the common bile duct. Narrowing in the biliary stent associated with the pancreatic mass. Small amount of pneumobilia. Small amount of fluid and stranding around the liver and gallbladder. Pancreas: Large poorly defined pancreatic head mass with multiple hypoechoic components. Difficult to accurately measure the mass itself but it roughly measures 4.0 x 4.9 x 5.1 cm. Dilatation of the main pancreatic duct. The mass extends cephalad and appears to be involving the caudate lobe. There is a caudate lesion measuring up to 4.1 cm. In addition, there is hypoechoic implant or fluid collection cephalad to the caudate lobe involving the hepatic subcapsular space. This subcapsular collection or implant measures 7.8 x 4.6 x 5.6 cm. This was present on the previous exam based on the prior report. This caudate/subcapsular component causing mass effect on the adjacent IVC. Mild stranding and edema throughout the porta hepatis region. Spleen: Small splenic calcifications.  Spleen is normal for size. Adrenals/Urinary Tract: Normal adrenal glands. Bilateral cortical and parapelvic renal cysts. No suspicious renal lesions. No hydronephrosis. Bladder is decompressed. There is probably a small bladder diverticulum along the posterior aspect of the  bladder. Cannot exclude mild bladder wall thickening. Asymmetric left perinephric stranding. Stomach/Bowel: Normal appearance of the stomach. No bowel dilatation. Colonic diverticula involving the sigmoid colon. No evidence for acute bowel inflammation. Vascular/Lymphatic: Atherosclerotic disease involving the abdominal aorta without aneurysm. Atherosclerotic calcifications at the origin of the SMA. Atherosclerotic  calcifications at the origin of the bilateral renal arteries. IVC is patent but there is compression in the suprarenal IVC due to the subcapsular fluid collection / mass. 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 Main portal vein is small but patent. Intrahepatic portal veins are patent. 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 Splenic vein is patent. Pathologic lymphadenopathy in the retrocaval space measuring up to 1.7 cm on image 39/2. Reproductive: Prostate contains calcifications, asymmetric towards the left. Other: Small amount of free fluid in the abdomen and pelvis. Musculoskeletal: Evidence for an old right tenth rib fracture. Bilateral inguinal hernias containing fat. Small amount of fluid in the left inguinal hernia. No suspicious osseous lesion. IMPRESSION: 1. Large pancreatic head mass compatible with the patient's history of pancreatic cancer. The mass extends cephalad and involves the caudate lobe. There is also a subcapsular fluid collection or implant cephalad to the caudate lobe causing mass effect on the adjacent IVC. 2. Multiple liver lesions, abdominal lymphadenopathy and multiple pulmonary nodules. Findings compatible with metastatic disease. 3. Metallic biliary stent in the common bile duct and there is a small amount of pneumobilia. No significant intrahepatic biliary dilatation. In addition, there is moderate distention of the gallbladder containing iodinated contrast  and small amount of gas. Findings compatible with recent ERCP. Gallbladder distension could be a source of abdominal pain. 4. Bilateral small pleural effusions with compressive atelectasis in the lower lobes. 5. Small amount of free fluid in the abdomen and pelvis. 6. Colonic diverticulosis without evidence for acute diverticulitis. 7. Aortic Atherosclerosis (ICD10-I70.0). 8. 9 mm splenic artery aneurysm. Electronically Signed   By: Richarda Overlie M.D.   On: 09/26/2022 09:50   DG Chest 2 View  Result Date: 09/26/2022 CLINICAL DATA:  77 year old male with history of central chest pain. EXAM: CHEST - 2 VIEW COMPARISON:  Chest x-ray 07/28/2014. FINDINGS: Lung volumes are low. No consolidative airspace disease. Trace bilateral pleural effusions with bibasilar opacities that are favored to reflect subsegmental atelectasis. No pneumothorax. No evidence of pulmonary edema. No definite suspicious appearing pulmonary nodules or masses are noted. Heart size is normal. Upper mediastinal contours are within normal limits. Atherosclerotic calcifications in the thoracic aorta. IMPRESSION: 1. Low lung volumes with trace bilateral pleural effusions and bibasilar opacities favored to reflect areas of subsegmental atelectasis. 2. Aortic atherosclerosis. Electronically Signed   By: Trudie Reed M.D.   On: 09/26/2022 06:19    PERFORMANCE STATUS (ECOG) : 4 - Bedbound  Review of Systems Unless otherwise noted, a complete review of systems is negative.  Physical Exam General: Frail HEENT: NGT Pulmonary: Unlabored Extremities: no edema, no joint deformities Skin: no rashes Neurological: Weakness but otherwise nonfocal  IMPRESSION: Patient with emphysematous cholecystitis with pneumoperitoneum.  He is status post thoracentesis and percutaneous cholecystostomy tube and perihepatic drain placement.  Follow-up visit.  Patient remains in ICU.  Requiring higher doses of pressors overnight.    Spoke with patient today  regarding goals.  He says that he agreed to procedures knowing that without doing so it would have likely resulted in death.  He says he remains in agreement with current scope of treatment and would like to continue care to see if he improves.  He seems to understand that his overall prognosis is poor and that he could decline even with ongoing care.  He says that he still hopes that he will be able to  leave the hospital and pursue cancer treatment.  PLAN: -Continue current scope of treatment -Agree with DNR -Will follow   Time Total: 20 minutes  Visit consisted of counseling and education dealing with the complex and emotionally intense issues of symptom management and palliative care in the setting of serious and potentially life-threatening illness.Greater than 50%  of this time was spent counseling and coordinating care related to the above assessment and plan.  Signed by: Laurette Schimke, PhD, NP-C

## 2022-10-01 NOTE — Progress Notes (Addendum)
Nutrition Follow-up  DOCUMENTATION CODES:   Severe malnutrition in context of chronic illness  INTERVENTION:   Once appropriate for enteral nutrition, recommend temporary nutrition support via NGT until patient's appetite and oral intake improves.   Recommend:  Vital 1.5@60ml /hr- Initiate at 22ml/hr, once tolerating, increase by 34ml/hr q 8 hours until goal rate is reached.   ProSource TF 20- Give 60ml daily via tube, each supplement provides 80kcal and 20g of protein.   Free water flushes 30ml q4 hours to maintain tube patency   Regimen provides 2240kcal/day, 117g/day protein and 1287ml/day.   Pt at high refeed risk; recommend monitor potassium, magnesium and phosphorus labs daily until stable  Daily weights   NUTRITION DIAGNOSIS:   Severe Malnutrition related to chronic illness (newly diagnosed pancreatic cancer) as evidenced by mild fat depletion, moderate fat depletion, moderate muscle depletion, severe muscle depletion, percent weight loss, energy intake < or equal to 75% for > or equal to 1 month. 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  -Pt s/p thoracentesis 7/16 with output  -Pt s/p NGT placement 7/17; tip noted gastric   Pt continues to have poor appetite and oral intake in hospital and has now remained NPO for 5 days. NGT placed yesterday for decompression and is noted to have out overnight and <280ml out this morning. Surgery concerned for possible  duodenal obstruction. Pt met with palliative care today to establish GOC. Pt would like to continue current scope of treatment to see if he improves. Once pt is appropriate for enteral nutrition, would recommend providing temporary nutrition support via existing NGT until patient's appetite and oral intake improves; this was discussed with medical team. Nutrition support would only be recommended to help pt through this acute illness and would not provide a long term solution to pt's malnutrition. G-tube would not be recommended in setting of pt's advanced illness and poor prognosis. Pt is at high refeed risk.   Per chart, pt is up ~12lbs since admission.   Medications reviewed and include: phoslo, colace, insulin, MVI, albumin, heparin, neo-synephrine, zosyn   Labs reviewed: Na 133(L), K 3.0 (L), BUN 86(H), creat 2.96(H), P 6.7(H), Mg 2.5(H) Wbc- 14.1(H) Cbgs- 105, 138 x 24 hrs  AIC 7.0(H)- 7/13  Drains-   Diet Order:   Diet Order             Diet NPO time specified Except for: Sips with Meds  Diet effective now                  EDUCATION NEEDS:   Education needs have been addressed  Skin:  Skin Assessment: Reviewed RN Assessment  Last BM:  7/18- type 6  Height:   Ht Readings from Last 1 Encounters:  09/26/22 6' (1.829 m)    Weight:   Wt Readings from Last 1 Encounters:  10/01/22 79.5 kg    Ideal Body Weight:  80.9 kg  BMI:  Body mass index is 23.77 kg/m.  Estimated Nutritional  Needs:   Kcal:  2200-2500kcal/day  Protein:  110-125 grams  Fluid:  2.0-2.3L/day  Cameron Holiday MS, RD, LDN Please refer to [facility] for RD and/or RD on-call/weekend/after hours pager

## 2022-10-01 NOTE — TOC Progression Note (Signed)
Transition of Care [facility]) - Progression Note    Patient Details  Name: Cameron Gutierrez MRN: [id number] Date of Birth: 03/19/1945  Transition [facility]) CM/SW Contact  Kreg Shropshire, RN Phone Number: 10/01/2022, 8:36 AM  Clinical Narrative:     Cm continue to follow for TOC needs.       Expected Discharge Plan and Services                                               Social Determinants of Health (SDOH) Interventions SDOH Screenings   Food Insecurity: No Food Insecurity (09/26/2022)  Housing: Low Risk  (09/26/2022)  Transportation Needs: No Transportation Needs (09/26/2022)  Utilities: Not At Risk (09/26/2022)  Financial Resource Strain: Low Risk  (09/21/2022)   Received from [facility]  Stress: No Stress Concern Present (09/21/2022)   Received from [facility]  Tobacco Use: Low Risk  (09/26/2022)  Health Literacy: Medium Risk (09/21/2022)   Received from [facility]    Readmission Risk Interventions     No data to display

## 2022-10-01 NOTE — Progress Notes (Signed)
Rounding Note    Patient Name: Cameron Gutierrez Date of Encounter: 10/01/2022  [facility] Cardiologist: None   Subjective   Patient is in Afib with rates 90-110. K3.0. Scr 2.96, BUN 86. Patient is still hypotensive on pressor support.   Inpatient Medications    Scheduled Meds:  atorvastatin  40 mg Oral q morning   calcium acetate  1,334 mg Oral TID WC   Chlorhexidine Gluconate Cloth  6 each Topical QHS   docusate sodium  100 mg Oral BID   feeding supplement  237 mL Oral TID BM   insulin aspart  0-9 Units Subcutaneous Q4H   multivitamin with minerals  1 tablet Oral Daily   sodium chloride flush  5 mL Intracatheter Q8H   Continuous Infusions:  sodium chloride Stopped (09/29/22 1140)   albumin human 60 mL/hr at 10/01/22 1218   amiodarone 30 mg/hr (10/01/22 1218)   heparin 850 Units/hr (10/01/22 1218)   phenylephrine (NEO-SYNEPHRINE) Adult infusion 70 mcg/min (10/01/22 1218)   piperacillin-tazobactam (ZOSYN)  IV 3.375 g (10/01/22 1337)   promethazine (PHENERGAN) injection (IM or IVPB)     PRN Meds: fentaNYL (SUBLIMAZE) injection, iohexol, levalbuterol, metoprolol tartrate, ondansetron **OR** ondansetron (ZOFRAN) IV, oxyCODONE, polyethylene glycol, promethazine (PHENERGAN) injection (IM or IVPB), traMADol, traZODone   Vital Signs    Vitals:   10/01/22 1215 10/01/22 1230 10/01/22 1245 10/01/22 1300  BP: (!) 83/59 (!) 89/59 (!) 80/55 98/65  Pulse: (!) 104 98 (!) 103 89  Resp: (!) 26 14 19 14   Temp:      TempSrc:      SpO2: 97% 97% 97% 97%  Weight:      Height:        Intake/Output Summary (Last 24 hours) at 10/01/2022 1347 Last data filed at 10/01/2022 1218 Gross per 24 hour  Intake 4777.96 ml  Output 3695 ml  Net 1082.96 ml      10/01/2022    5:00 AM 09/26/2022    5:19 AM 03/26/2022    8:09 PM  Last 3 Weights  Weight (lbs) 175 lb 4.3 oz 163 lb 200 lb  Weight (kg) 79.5 kg 73.936 kg 90.719 kg      Telemetry    Afib HR 90-110 - Personally  Reviewed  ECG    No new - Personally Reviewed  Physical Exam   GEN: No acute distress.   Neck: No JVD Cardiac: RRR, no murmurs, rubs, or gallops.  Respiratory: diffusely decreased lung sounds GI: Soft, nontender, non-distended  MS: No edema; No deformity. Neuro:  Nonfocal  Psych: Normal affect   Labs    High Sensitivity Troponin:   Recent Labs  Lab 09/26/22 0655 09/27/22 1421 09/28/22 1834  TROPONINIHS 6 11 23*     Chemistry Recent Labs  Lab 09/26/22 0655 09/28/22 1607 09/29/22 0949 09/30/22 0334 10/01/22 0448  NA 133*   < > 134* 133* 133*  K 3.4*   < > 3.7 3.7 3.0*  CL 98   < > 97* 98 98  CO2 25   < > 20* 22 22  GLUCOSE 148*   < > 176* 138* 132*  BUN 15   < > 85* 89* 86*  CREATININE 0.97   < > 3.17* 3.05* 2.96*  CALCIUM 7.4*   < > 7.0* 7.1* 6.9*  MG  --   --  2.4 2.5* 2.5*  PROT 5.4*  --  5.6* 5.2*  --   ALBUMIN 2.2*  --  2.0* 1.9* 1.7*  AST  32  --  222* 424*  --   ALT 24  --  106* 174*  --   ALKPHOS 113  --  192* 277*  --   BILITOT 1.1  --  1.6* 1.6*  --   GFRNONAA >60   < > 20* 21* 21*  ANIONGAP 10   < > 17* 13 13   < > = values in this interval not displayed.    Lipids No results for input(s): "CHOL", "TRIG", "HDL", "LABVLDL", "LDLCALC", "CHOLHDL" in the last 168 hours.  Hematology Recent Labs  Lab 09/29/22 0201 09/30/22 0334 10/01/22 0448  WBC 20.7* 17.7* 14.1*  RBC 4.59 4.45 4.39  HGB 12.8* 12.4* 12.1*  HCT 37.3* 34.5* 33.3*  MCV 81.3 77.5* 75.9*  MCH 27.9 27.9 27.6  MCHC 34.3 35.9 36.3*  RDW 13.7 13.8 13.8  PLT 333 288 273   Thyroid No results for input(s): "TSH", "FREET4" in the last 168 hours.  BNP Recent Labs  Lab 09/28/22 1827  BNP 222.2*    DDimer  Recent Labs  Lab 09/29/22 1139  DDIMER 7.06*     Radiology    DG Abd 1 View  Result Date: 09/30/2022 CLINICAL DATA:  NG tube placement EXAM: ABDOMEN - 1 VIEW COMPARISON:  CT abdomen/pelvis 1 day prior FINDINGS: The enteric catheter tip and sidehole are in the stomach. The  stomach remains significantly distended. Air in the gallbladder lumen and wall is noted consistent with the findings of emphysematous cholecystitis seen on CT from 1 day prior. The free intraperitoneal air seen on that study is not definitely seen on this study. There is gaseous distention of the bowel in the imaged abdomen. A biliary stent is noted. IMPRESSION: 1. Enteric catheter tip and sidehole in the stomach which remains distended. 2. Air in the gallbladder lumen and wall consistent with the findings of emphysematous cholecystitis on the CT study from 1 day prior. The pneumoperitoneum seen on that study is not appreciated on the current study. Electronically Signed   By: Lesia Hausen M.D.   On: 09/30/2022 13:28   CT PERC CHOLECYSTOSTOMY  Result Date: 09/30/2022 INDICATION: 77 year old male with history recently diagnosed advanced stage pancreatic cancer with biliary obstruction status post covered common bile duct stent placement at outside facility approximally 1 week ago, now with emphysematous/ruptured acute cholecystitis with associated perihepatic fluid collection. EXAM: CT PERC DRAIN PERITONEAL ABCESS; CT PERCUTANEOUS CHOLECYSTOSTOMY COMPARISON:  None Available. MEDICATIONS: The patient is currently admitted to the hospital and receiving intravenous antibiotics. The antibiotics were administered within an appropriate time frame prior to the initiation of the procedure. ANESTHESIA/SEDATION: Moderate (conscious) sedation was employed during this procedure. A total of Versed 0.5 mg and Fentanyl 25 mcg was administered intravenously. Moderate Sedation Time: 16 minutes. The patient's level of consciousness and vital signs were monitored continuously by radiology nursing throughout the procedure under my direct supervision. CONTRAST:  None COMPLICATIONS: None immediate. PROCEDURE: RADIATION DOSE REDUCTION: This exam was performed according to the departmental dose-optimization program which includes  automated exposure control, adjustment of the mA and/or kV according to patient size and/or use of iterative reconstruction technique. Informed written consent was obtained from the patient after a discussion of the risks, benefits and alternatives to treatment. The patient was placed supine on the CT gantry and a pre procedural CT was performed re-demonstrating the known abscess/fluid collection within the gallbladder and perihepatic region. The procedure was planned. A timeout was performed prior to the initiation of the procedure. The right upper  quadrant was prepped and draped in the usual sterile fashion. The overlying soft tissues were anesthetized with 1% lidocaine with epinephrine. Appropriate trajectories were planned with the use of a 22 gauge spinal needle. Two separate 18 gauge trocar needles were advanced into the gallbladder as well as into the perihepatic fluid collection and short Amplatz super stiff wires were coiled within the gallbladder and perihepatic fluid collection. Appropriate positioning was confirmed with a limited CT scan. The tracks were serially dilated allowing placement of 2, 10 French all-purpose drainage catheters. Appropriate positioning was confirmed with a limited postprocedural CT scan. Approximately 100 ml of bilious and purulent fluid was aspirated from the gallbladder in a proximally 500 mL of similar appearing, bilious and purulent fluid was aspirated from the perihepatic fluid collection. The drains were connected to a bulb suction and sutured in place. Dressings were placed. The patient tolerated the procedure well without immediate post procedural complication. IMPRESSION: 1. Successful CT guided placement of a 10 French percutaneous cholecystostomy drain with aspiration of 100 mL of bilious and purulent fluid. Samples were sent to the laboratory as requested by the ordering clinical team. 2. Successful CT guided placement of a 10 French right upper quadrant perihepatic  fluid collection drain with aspiration of 500 mL of bilious and purulent fluid. Marliss Coots, MD [facility] Electronically Signed   By: Marliss Coots M.D.   On: 09/30/2022 13:19   CT GUIDED PERITONEAL/RETROPERITONEAL FLUID DRAIN BY PERC CATH  Result Date: 09/30/2022 INDICATION: 77 year old male with history recently diagnosed advanced stage pancreatic cancer with biliary obstruction status post covered common bile duct stent placement at outside facility approximally 1 week ago, now with emphysematous/ruptured acute cholecystitis with associated perihepatic fluid collection. EXAM: CT PERC DRAIN PERITONEAL ABCESS; CT PERCUTANEOUS CHOLECYSTOSTOMY COMPARISON:  None Available. MEDICATIONS: The patient is currently admitted to the hospital and receiving intravenous antibiotics. The antibiotics were administered within an appropriate time frame prior to the initiation of the procedure. ANESTHESIA/SEDATION: Moderate (conscious) sedation was employed during this procedure. A total of Versed 0.5 mg and Fentanyl 25 mcg was administered intravenously. Moderate Sedation Time: 16 minutes. The patient's level of consciousness and vital signs were monitored continuously by radiology nursing throughout the procedure under my direct supervision. CONTRAST:  None COMPLICATIONS: None immediate. PROCEDURE: RADIATION DOSE REDUCTION: This exam was performed according to the departmental dose-optimization program which includes automated exposure control, adjustment of the mA and/or kV according to patient size and/or use of iterative reconstruction technique. Informed written consent was obtained from the patient after a discussion of the risks, benefits and alternatives to treatment. The patient was placed supine on the CT gantry and a pre procedural CT was performed re-demonstrating the known abscess/fluid collection within the gallbladder and perihepatic region. The  procedure was planned. A timeout was performed prior to the initiation of the procedure. The right upper quadrant was prepped and draped in the usual sterile fashion. The overlying soft tissues were anesthetized with 1% lidocaine with epinephrine. Appropriate trajectories were planned with the use of a 22 gauge spinal needle. Two separate 18 gauge trocar needles were advanced into the gallbladder as well as into the perihepatic fluid collection and short Amplatz super stiff wires were coiled within the gallbladder and perihepatic fluid collection. Appropriate positioning was confirmed with a limited CT scan. The tracks were serially dilated allowing placement of 2, 10 French all-purpose drainage catheters. Appropriate positioning was confirmed with a limited postprocedural CT scan. Approximately 100 ml of  bilious and purulent fluid was aspirated from the gallbladder in a proximally 500 mL of similar appearing, bilious and purulent fluid was aspirated from the perihepatic fluid collection. The drains were connected to a bulb suction and sutured in place. Dressings were placed. The patient tolerated the procedure well without immediate post procedural complication. IMPRESSION: 1. Successful CT guided placement of a 10 French percutaneous cholecystostomy drain with aspiration of 100 mL of bilious and purulent fluid. Samples were sent to the laboratory as requested by the ordering clinical team. 2. Successful CT guided placement of a 10 French right upper quadrant perihepatic fluid collection drain with aspiration of 500 mL of bilious and purulent fluid. Marliss Coots, MD [facility] Electronically Signed   By: Marliss Coots M.D.   On: 09/30/2022 13:19   CT ABDOMEN PELVIS WO CONTRAST  Addendum Date: 09/29/2022   ADDENDUM REPORT: 09/29/2022 23:18 ADDENDUM: These results were called by telephone at the time of interpretation on 09/29/2022 at 11:14 pm to provider  Dr. Everlene Farrier, who verbally acknowledged these results. Electronically Signed   By: Tish Frederickson M.D.   On: 09/29/2022 23:18   Result Date: 09/29/2022 CLINICAL DATA:  Bowel obstruction suspected EXAM: CT ABDOMEN AND PELVIS WITHOUT CONTRAST TECHNIQUE: Multidetector CT imaging of the abdomen and pelvis was performed following the standard protocol without IV contrast. RADIATION DOSE REDUCTION: This exam was performed according to the departmental dose-optimization program which includes automated exposure control, adjustment of the mA and/or kV according to patient size and/or use of iterative reconstruction technique. COMPARISON:  CT abdomen pelvis 09/26/2022 FINDINGS: Lower chest: Partially visualized bilateral small pleural effusions with bilateral lower lobe passive atelectasis. Hepatobiliary: Redemonstration of a couple of hepatic lesions consistent with known metastases. Air-fluid level within the gastric lumen. Air noted within the wall of the gallbladder. Common bile duct stent in appropriate position with associated pneumobilia. Pancreas: Proximal pancreatic mass again noted in difficult to delineate on this study. Finding measures approximately 12.6 x 4.7 cm extending along the liver and duodenum. No surrounding inflammatory changes. No main pancreatic ductal dilatation. Spleen: Normal in size without focal abnormality. Adrenals/Urinary Tract: No adrenal nodule bilaterally. No nephrolithiasis and no hydronephrosis. Several fluid density lesions likely represent simple renal cysts. Simple renal cysts, in the absence of clinically indicated signs/symptoms, require no independent follow-up. No ureterolithiasis or hydroureter. The urinary bladder is decompressed with Foley catheter tip and balloon terminating within the lumen. Stomach/Bowel: Stomach distended with air-fluid level. Bowel thickening of the first portion of the duodenum suggestive of possible metastatic invasion. No evidence of large bowel wall  thickening or dilatation. Colonic diverticulosis. Appendix appears normal. Vascular/Lymphatic: No abdominal aorta or iliac aneurysm. Moderate atherosclerotic plaque of the aorta and its branches. No abdominal, pelvic, or inguinal lymphadenopathy. Reproductive: Prostate is unremarkable. Other: Interval increase in trace to small volume simple free fluid within the right upper quadrant. Associated free gas. No intraperitoneal free gas. No organized fluid collection. Musculoskeletal: Bilateral inguinal hernias containing fat and fluid. Subcutaneus soft tissue edema. No suspicious lytic or blastic osseous lesions. No acute displaced fracture. Multilevel degenerative changes of the spine. IMPRESSION: 1. Acute emphysematous cholecystitis. Recommend emergent surgical consultation. 2. Interval development of pneumoperitoneum. 3. Interval increase in small volume perihepatic ascites. 4. Gastric lumen distended with air-fluid level. Developing obstruction along the first portion of duodenum in the region of the pancreatic malignancy not excluded. 5. Metastatic pancreatic malignancy poorly evaluated on this noncontrast study. Please see CT abdomen pelvis 09/26/2022 for  further details. 6. Partially visualized bilateral small pleural effusions with bilateral lower lobe passive atelectasis. 7.  Aortic Atherosclerosis (ICD10-I70.0). These results were called by telephone at the time of interpretation on 09/29/2022 at 10:37 pm to provider Dr. Karna Christmas, who verbally acknowledged these results. Electronically Signed: By: Tish Frederickson M.D. On: 09/29/2022 22:44   US THORACENTESIS ASP PLEURAL SPACE W/IMG GUIDE  Result Date: 09/29/2022 INDICATION: Right pleural effusion.  Pancreatic cancer. EXAM: ULTRASOUND GUIDED RIGHT THORACENTESIS MEDICATIONS: None. COMPLICATIONS: None immediate. PROCEDURE: An ultrasound guided thoracentesis was thoroughly discussed with the patient and questions answered. The benefits, risks, alternatives and  complications were also discussed. The patient understands and wishes to proceed with the procedure. Written consent was obtained. Ultrasound was performed to localize and mark an adequate pocket of fluid in the right chest. The area was then prepped and draped in the normal sterile fashion. 1% Lidocaine was used for local anesthesia. Under ultrasound guidance a 6 Fr Safe-T-Centesis catheter was introduced. Thoracentesis was performed. The catheter was removed and a dressing applied. FINDINGS: A total of approximately 800 mL of dark yellow fluid was removed. Samples were sent to the laboratory as requested by the clinical team. Images were also obtained of the right upper abdomen to evaluate the gallbladder. Loculated perihepatic ascites is identified. Previously identified gallbladder is poorly characterized because there appears to be air within the gallbladder. The amount of fluid around the liver has significantly increased since the right upper quadrant ultrasound 06/27/2022. IMPRESSION: 1. Successful ultrasound guided right thoracentesis yielding 800 mL of pleural fluid. 2. Images of the right upper quadrant demonstrate markedly increased perihepatic ascites. Ascites appears to be complex. In addition, the gallbladder now appears to contain a large amount of gas which is likely related to the biliary stent and pneumobilia. Electronically Signed   By: Richarda Overlie M.D.   On: 09/29/2022 15:52   DG Chest Port 1 View  Result Date: 09/29/2022 CLINICAL DATA:  Pleural effusion.  Status post thoracentesis. EXAM: PORTABLE CHEST 1 VIEW COMPARISON:  Chest radiographs 09/28/2022 and 09/26/2022. Abdominal CT 09/26/2022. FINDINGS: 1417 hours. Interval decreased volume of right pleural effusion. A left pleural effusion and left basilar airspace disease appear unchanged. No pneumothorax. The heart size and mediastinal contours are stable. Moderate gaseous distension of the stomach noted. Curvilinear lucency in the right  upper quadrant corresponds with air in the gallbladder lumen on recent CT. Patient has a biliary stent. IMPRESSION: 1. Decreased right pleural effusion following thoracentesis. No pneumothorax. 2. No significant change in left pleural effusion and left basilar airspace disease. Electronically Signed   By: Carey Bullocks M.D.   On: 09/29/2022 15:46    Cardiac Studies   Echo 09/29/22 1. Left ventricular ejection fraction, by estimation, is 60 to 65%. The  left ventricle has normal function. The left ventricle has no regional  wall motion abnormalities. There is mild asymmetric left ventricular  hypertrophy of the basal-septal segment.  Left ventricular diastolic function could not be evaluated.   2. Pulmonary artery pressure is mildly elevated (RVSP 30-35 mmHg plus  central venous/right atrial pressure). Right ventricular systolic function  is normal. The right ventricular size is normal.   3. Moderate pleural effusion in the right lateral region.   4. The mitral valve is normal in structure. Mild to moderate mitral valve  regurgitation.   5. Tricuspid valve regurgitation is mild to moderate.   6. The aortic valve is tricuspid. Aortic valve regurgitation is mild. No  aortic stenosis is present.  Patient Profile     77 y.o. male with history of adult onset type 1 diabetes, hypertension, CKD stage II, hyperlipidemia, atrial fibrillation of uncertain chronicity on chronic anticoagulation with apixaban, recently diagnosed metastatic pancreatic adenocarcinoma, who is being seen 09/29/2022 for the evaluation of atrial fibrillation RVR.  Assessment & Plan   Afib RVR - uncertain chronicity of Afib - presented 7/13 with rate controlled Afib. On 7/15 during procedure, Afiib rates increased to 116bpm. Procedure was canceled - Afib RVR in the setting of Sepsis, cholecystitis, metastatic adenocarcinoma of the pancreas, hypotension, and multiorgan failure - given hypotension requiring pressors he was  placed on IV amiodarone - Echo showed preserved LVEF - IV heparin, plan to switch to [facility] at d/c - CHADSVASC at least 4 - Afib rates 90-110. Still hypotensive this AM.Continue IV amiodarone  For questions or updates, please contact [facility] Please consult [email or web address] for contact info under        Signed, Rubina Basinski David Stall, PA-C  10/01/2022, 1:47 PM

## 2022-10-01 NOTE — Plan of Care (Signed)

## 2022-10-02 DIAGNOSIS — J90 Pleural effusion, not elsewhere classified: Secondary | ICD-10-CM | POA: Diagnosis not present

## 2022-10-02 DIAGNOSIS — K810 Acute cholecystitis: Secondary | ICD-10-CM | POA: Diagnosis not present

## 2022-10-02 DIAGNOSIS — C259 Malignant neoplasm of pancreas, unspecified: Secondary | ICD-10-CM | POA: Diagnosis not present

## 2022-10-02 DIAGNOSIS — Z515 Encounter for palliative care: Secondary | ICD-10-CM | POA: Diagnosis not present

## 2022-10-02 DIAGNOSIS — E43 Unspecified severe protein-calorie malnutrition: Secondary | ICD-10-CM

## 2022-10-02 DIAGNOSIS — I4891 Unspecified atrial fibrillation: Secondary | ICD-10-CM | POA: Diagnosis not present

## 2022-10-02 LAB — RENAL FUNCTION PANEL
Albumin: 2 g/dL — ABNORMAL LOW (ref 3.5–5.0)
Anion gap: 11 (ref 5–15)
BUN: 76 mg/dL — ABNORMAL HIGH (ref 8–23)
CO2: 22 mmol/L (ref 22–32)
Calcium: 6.9 mg/dL — ABNORMAL LOW (ref 8.9–10.3)
Chloride: 100 mmol/L (ref 98–111)
Creatinine, Ser: 2.49 mg/dL — ABNORMAL HIGH (ref 0.61–1.24)
GFR, Estimated: 26 mL/min — ABNORMAL LOW (ref >60)
Glucose, Bld: 119 mg/dL — ABNORMAL HIGH (ref 70–99)
Phosphorus: 4.4 mg/dL (ref 2.5–4.6)
Potassium: 3 mmol/L — ABNORMAL LOW (ref 3.5–5.1)
Sodium: 133 mmol/L — ABNORMAL LOW (ref 135–145)

## 2022-10-02 LAB — CBC
HCT: 28.7 % — ABNORMAL LOW (ref 39.0–52.0)
Hemoglobin: 10.3 g/dL — ABNORMAL LOW (ref 13.0–17.0)
MCH: 27.7 pg (ref 26.0–34.0)
MCHC: 35.9 g/dL (ref 30.0–36.0)
MCV: 77.2 fL — ABNORMAL LOW (ref 80.0–100.0)
Platelets: 187 10*3/uL (ref 150–400)
RBC: 3.72 MIL/uL — ABNORMAL LOW (ref 4.22–5.81)
RDW: 13.7 % (ref 11.5–15.5)
WBC: 9 10*3/uL (ref 4.0–10.5)
nRBC: 0 % (ref 0.0–0.2)

## 2022-10-02 LAB — PROCALCITONIN: Procalcitonin: 5.95 ng/mL

## 2022-10-02 LAB — HEPATIC FUNCTION PANEL
ALT: 310 U/L — ABNORMAL HIGH (ref 0–44)
AST: 844 U/L — ABNORMAL HIGH (ref 15–41)
Albumin: 1.9 g/dL — ABNORMAL LOW (ref 3.5–5.0)
Alkaline Phosphatase: 685 U/L — ABNORMAL HIGH (ref 38–126)
Bilirubin, Direct: 1.4 mg/dL — ABNORMAL HIGH (ref 0.0–0.2)
Indirect Bilirubin: 1.1 mg/dL — ABNORMAL HIGH (ref 0.3–0.9)
Total Bilirubin: 2.5 mg/dL — ABNORMAL HIGH (ref 0.3–1.2)
Total Protein: 4.7 g/dL — ABNORMAL LOW (ref 6.5–8.1)

## 2022-10-02 LAB — GLUCOSE 6 PHOSPHATE DEHYDROGENASE
G6PDH: 11.4 U/g{Hb} (ref 4.8–15.7)
Hemoglobin: 13.3 g/dL (ref 13.0–17.7)

## 2022-10-02 LAB — RETIC PANEL
Immature Retic Fract: 7.2 % (ref 2.3–15.9)
RBC.: 3.75 MIL/uL — ABNORMAL LOW (ref 4.22–5.81)
Retic Count, Absolute: 27.8 10*3/uL (ref 19.0–186.0)
Retic Ct Pct: 0.7 % (ref 0.4–3.1)
Reticulocyte Hemoglobin: 29.9 pg (ref >27.9)

## 2022-10-02 LAB — GLUCOSE, CAPILLARY
Glucose-Capillary: 122 mg/dL — ABNORMAL HIGH (ref 70–99)
Glucose-Capillary: 124 mg/dL — ABNORMAL HIGH (ref 70–99)
Glucose-Capillary: 124 mg/dL — ABNORMAL HIGH (ref 70–99)
Glucose-Capillary: 148 mg/dL — ABNORMAL HIGH (ref 70–99)
Glucose-Capillary: 146 mg/dL — ABNORMAL HIGH (ref 70–99)

## 2022-10-02 LAB — HEPARIN LEVEL (UNFRACTIONATED)
Heparin Unfractionated: 0.25 IU/mL — ABNORMAL LOW (ref 0.30–0.70)
Heparin Unfractionated: 0.29 IU/mL — ABNORMAL LOW (ref 0.30–0.70)
Heparin Unfractionated: 0.38 IU/mL (ref 0.30–0.70)

## 2022-10-02 LAB — MAGNESIUM: Magnesium: 2.4 mg/dL (ref 1.7–2.4)

## 2022-10-02 MED ORDER — HEPARIN BOLUS VIA INFUSION
1100.0000 [IU] | Freq: Once | INTRAVENOUS | Status: AC
  Administered 2022-10-02: 1100 [IU] via INTRAVENOUS
  Filled 2022-10-02: qty 1100

## 2022-10-02 MED ORDER — MIDODRINE HCL 5 MG PO TABS
10.0000 mg | ORAL_TABLET | Freq: Three times a day (TID) | ORAL | Status: DC
  Administered 2022-10-02 – 2022-10-12 (×31): 10 mg via ORAL
  Filled 2022-10-02 (×29): qty 2

## 2022-10-02 MED ORDER — POTASSIUM CHLORIDE 10 MEQ/100ML IV SOLN
10.0000 meq | INTRAVENOUS | Status: AC
  Administered 2022-10-02 (×5): 10 meq via INTRAVENOUS
  Filled 2022-10-02 (×5): qty 100

## 2022-10-02 NOTE — Progress Notes (Signed)
[facility]  ROUNDING NOTE   Subjective:   Family at bedside.   Phenylephrine gtt.   Patient states he is feeling better.   UOP .   Creatinine 2.49 (2.96)    Objective:  Vital signs in last 24 hours:  Temp:  [98 F (36.7 C)-98.5 F (36.9 C)] 98.5 F (36.9 C) (07/19 0315) Pulse Rate:  [83-106] 87 (07/19 1030) Resp:  [13-26] 21 (07/19 1030) BP: (77-107)/(51-74) 90/62 (07/19 1030) SpO2:  [91 %-99 %] 98 % (07/19 1030) Weight:  [81.9 kg] 81.9 kg (07/19 0420)  Weight change: 2.4 kg Filed Weights   09/26/22 0519 10/01/22 0500 10/02/22 0420  Weight: 73.9 kg 79.5 kg 81.9 kg    Intake/Output: I/O last 3 completed shifts: In: 7413.8 [P.O.:960; I.V.:3418.6; IV Piggyback:3035.2] Out: 4138 [Urine:2707; Emesis/NG output:1150; Drains:281]   Intake/Output this shift:  Total I/O In: -  Out: 300 [Urine:300]  Physical Exam: General: NAD, cachectic, laying in bed  Head: Normocephalic, atraumatic. Moist oral mucosal membranes  Eyes: Anicteric, PERRL  Neck: Supple, trachea midline  Lungs:  irregular  Heart: Regular rate and rhythm  Abdomen:  Soft, +epigastric tenderness, RUQ drain  Extremities:  no peripheral edema.  Neurologic: Nonfocal, moving all four extremities  Skin: No lesions  Access: none    Basic Metabolic Panel: Recent Labs  Lab 09/28/22 1607 09/29/22 0949 09/30/22 0334 10/01/22 0448 10/01/22 2000 10/02/22 0423  NA 134* 134* 133* 133*  --  133*  K 4.4 3.7 3.7 3.0* 3.3* 3.0*  CL 96* 97* 98 98  --  100  CO2 23 20* 22 22  --  22  GLUCOSE 157* 176* 138* 132*  --  119*  BUN 70* 85* 89* 86*  --  76*  CREATININE 2.90* 3.17* 3.05* 2.96*  --  2.49*  CALCIUM 7.3* 7.0* 7.1* 6.9*  --  6.9*  MG  --  2.4 2.5* 2.5*  --  2.4  PHOS  --  8.8* 7.7* 6.7*  --  4.4    Liver Function Tests: Recent Labs  Lab 09/26/22 0655 09/29/22 0949 09/30/22 0334 10/01/22 0448 10/02/22 0423 10/02/22 1044  AST 32 222* 424*  --   --  844*  ALT 24 106* 174*  --    --  310*  ALKPHOS 113 192* 277*  --   --  685*  BILITOT 1.1 1.6* 1.6*  --   --  2.5*  PROT 5.4* 5.6* 5.2*  --   --  4.7*  ALBUMIN 2.2* 2.0* 1.9* 1.7* 2.0* 1.9*   Recent Labs  Lab 09/26/22 0655  LIPASE 19   No results for input(s): "AMMONIA" in the last 168 hours.  CBC: Recent Labs  Lab 09/28/22 1607 09/29/22 0201 09/30/22 0334 10/01/22 0448 10/02/22 0423  WBC 18.7* 20.7* 17.7* 14.1* 9.0  HGB 12.9* 12.8* 12.4* 12.1* 10.3*  HCT 36.5* 37.3* 34.5* 33.3* 28.7*  MCV 79.5* 81.3 77.5* 75.9* 77.2*  PLT 327 333 288 273 187    Cardiac Enzymes: No results for input(s): "CKTOTAL", "CKMB", "CKMBINDEX", "TROPONINI" in the last 168 hours.  BNP: Invalid input(s): "POCBNP"  CBG: Recent Labs  Lab 10/01/22 1929 10/01/22 2335 10/02/22 0312 10/02/22 0720 10/02/22 1129  GLUCAP 122* 117* 122* 124* 124*    Microbiology: Results for orders placed or performed during the hospital encounter of 09/26/22  MRSA Next Gen by PCR, Nasal     Status: None   Collection Time: 09/28/22  4:12 PM   Specimen: Nasal Mucosa; Nasal Swab  Result  Value Ref Range Status   MRSA by PCR Next Gen NOT DETECTED NOT DETECTED Final    Comment: (NOTE) The GeneXpert MRSA Assay (FDA approved for NASAL specimens only), is one component of a comprehensive MRSA colonization surveillance program. It is not intended to diagnose MRSA infection nor to guide or monitor treatment for MRSA infections. Test performance is not FDA approved in patients less than 33 years old. Performed at [facility], [address] [place], [place] [address]   Aerobic/Anaerobic Culture w Gram Stain (surgical/deep wound)     Status: None (Preliminary result)   Collection Time: 09/30/22 12:51 PM   Specimen: Gallbladder; Abscess  Result Value Ref Range Status   Specimen Description   Final    GALL BLADDER Performed at [facility], [address], [place], [place] [address]    Special Requests   Final     NONE Performed at [facility], [address] [place], [place] [address]    Gram Stain   Final    NO WBC SEEN FEW GRAM VARIABLE ROD RARE GRAM POSITIVE COCCI IN PAIRS Performed at [facility], [address] [place], [place] [address]    Culture   Final    RARE CITROBACTER FREUNDII RARE MORGANELLA MORGANII ABUNDANT ENTEROCOCCUS FAECALIS SUSCEPTIBILITIES TO FOLLOW NO ANAEROBES ISOLATED; CULTURE IN PROGRESS FOR 5 DAYS    Report Status PENDING  Incomplete    Coagulation Studies: Recent Labs    09/30/22 0334  LABPROT 23.2*  INR 2.0*    Urinalysis: No results for input(s): "COLORURINE", "LABSPEC", "PHURINE", "GLUCOSEU", "HGBUR", "BILIRUBINUR", "KETONESUR", "PROTEINUR", "UROBILINOGEN", "NITRITE", "LEUKOCYTESUR" in the last 72 hours.  Invalid input(s): "APPERANCEUR"     Imaging: No results found.   Medications:    sodium chloride Stopped (09/29/22 1140)   albumin human 25 g (10/02/22 1100)   amiodarone 30 mg/hr (10/02/22 0615)   heparin 950 Units/hr (10/02/22 4166)   phenylephrine (NEO-SYNEPHRINE) Adult infusion Stopped (10/02/22 1200)   piperacillin-tazobactam (ZOSYN)  IV 12.5 mL/hr at 10/02/22 0615   promethazine (PHENERGAN) injection (IM or IVPB)      atorvastatin  40 mg Oral q morning   Chlorhexidine Gluconate Cloth  6 each Topical QHS   docusate sodium  100 mg Oral BID   feeding supplement  237 mL Oral TID BM   insulin aspart  0-9 Units Subcutaneous Q4H   midodrine  10 mg Oral TID WC   multivitamin with minerals  1 tablet Oral Daily   sodium chloride flush  5 mL Intracatheter Q8H   fentaNYL (SUBLIMAZE) injection, iohexol, levalbuterol, metoprolol tartrate, ondansetron **OR** ondansetron (ZOFRAN) IV, oxyCODONE, polyethylene glycol, promethazine (PHENERGAN) injection (IM or IVPB), traMADol, traZODone  Assessment/ Plan:  [name] is a 77 y.o.  male  with atrial fibrillation, pancreatic cancer, diabetes mellitus type II,  hyperlipidemia, hypertension, who was admitted to [facility] on 09/26/2022 for Generalized abdominal pain [R10.84] Adenocarcinoma of pancreas (HCC) [C25.9] Primary pancreatic cancer with metastasis to other site [facility]) [C25.9]  Nephrology consulted for acute kidney injury.    Acute Kidney Injury: baseline creatinine of 0.97 with GFR >60 on admission, 7/13. VI contrast exposure on 7/13. Suspect IV contrast induced nephropathy plus prerenal azotemia. - creatinine slowly improving. Nonoliguric urine output.  - holding home lisinopril - Now off IV fluids   Hypotension: placed on phenylephrine gtt.  - holding home regimen of amlodipine and lisinopril.    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 history of glycosuria.  Hemoglobin A1c of 7% on admission.  - continue glucose control.    LOS: 5 Cameron Gutierrez 7/19/20242:00 PM

## 2022-10-02 NOTE — Progress Notes (Signed)
ANTICOAGULATION CONSULT NOTE   Pharmacy Consult for IV heparin Indication: atrial fibrillation  No Known Allergies  Patient Measurements: Height: 6' (182.9 cm) Weight: 81.9 kg (180 lb 8.9 oz) IBW/kg (Calculated) : 77.6 Heparin Dosing Weight: 73.9 kg  Vital Signs: Temp: 98.5 F (36.9 C) (07/19 1930) Temp Source: Oral (07/19 1930) BP: 93/67 (07/19 2100) Pulse Rate: 83 (07/19 2100)  Labs: Recent Labs     0000 09/29/22 2246 09/30/22 0334 09/30/22 0851 09/30/22 0851 10/01/22 0013 10/01/22 0448 10/01/22 0839 10/02/22 0423 10/02/22 1338  HGB   < >  --  12.4*  --   --   --  12.1*  --  10.3*  --   HCT  --   --  34.5*  --   --   --  33.3*  --  28.7*  --   PLT  --   --  288  --   --   --  273  --  187  --   APTT  --  128*  --  114*  --  128*  --   --   --   --   LABPROT  --   --  23.2*  --   --   --   --   --   --   --   INR  --   --  2.0*  --   --   --   --   --   --   --   HEPARINUNFRC  --   --   --  0.38   < > 0.38  --  0.33 0.25* 0.29*  CREATININE  --   --  3.05*  --   --   --  2.96*  --  2.49*  --    < > = values in this interval not displayed.    Estimated Creatinine Clearance: 28.1 mL/min (A) (by C-G formula based on SCr of 2.49 mg/dL (H)).   Medical History: Past Medical History:  Diagnosis Date   Atrial fibrillation (HCC)    Cancer (HCC)    pancreatic   Diabetes mellitus without complication (HCC)    Hypercholesteremia    Hypertension     Medications:  Eliquis 5 mg BID (last dose 09/24/22)  Assessment: 77 year old male with PMH atrial fibrillation on Eliquis prior to admission. Plan was for cholecystectomy drain placement, but was cancelled due to acute Afib.   Goal of Therapy:  HL 0.3 - 0.7  Monitor platelets by anticoagulation protocol: Yes   Plan:  - Heparin level is therapeutic.  - continue heparin infusion at 1100 units/hr - Recheck heparin level in 8 hours - CBC daily while on heparin.    Lowella Bandy, PharmD Clinical  Pharmacist 10/02/2022 9:32 PM

## 2022-10-02 NOTE — Plan of Care (Signed)

## 2022-10-02 NOTE — Progress Notes (Signed)
ANTICOAGULATION CONSULT NOTE   Pharmacy Consult for IV heparin Indication: atrial fibrillation  No Known Allergies  Patient Measurements: Height: 6' (182.9 cm) Weight: 81.9 kg (180 lb 8.9 oz) IBW/kg (Calculated) : 77.6 Heparin Dosing Weight: 73.9 kg  Vital Signs: Temp: 98.5 F (36.9 C) (07/19 0315) Temp Source: Oral (07/19 0315) BP: 93/59 (07/19 0530) Pulse Rate: 93 (07/19 0530)  Labs: Recent Labs     0000 09/29/22 0949 09/29/22 2246 09/30/22 0334 09/30/22 0851 09/30/22 0851 10/01/22 0013 10/01/22 0448 10/01/22 0839 10/02/22 0423  HGB   < >  --   --  12.4*  --   --   --  12.1*  --  10.3*  HCT  --   --   --  34.5*  --   --   --  33.3*  --  28.7*  PLT  --   --   --  288  --   --   --  273  --  187  APTT  --  100* 128*  --  114*  --  128*  --   --   --   LABPROT  --  22.5*  --  23.2*  --   --   --   --   --   --   INR  --  2.0*  --  2.0*  --   --   --   --   --   --   HEPARINUNFRC  --   --   --   --  0.38   < > 0.38  --  0.33 0.25*  CREATININE  --  3.17*  --  3.05*  --   --   --  2.96*  --   --    < > = values in this interval not displayed.    Estimated Creatinine Clearance: 23.7 mL/min (A) (by C-G formula based on SCr of 2.96 mg/dL (H)).   Medical History: Past Medical History:  Diagnosis Date   Atrial fibrillation (HCC)    Cancer (HCC)    pancreatic   Diabetes mellitus without complication (HCC)    Hypercholesteremia    Hypertension     Medications:  Eliquis 5 mg BID (last dose 09/24/22)  Assessment: 77 year old male with PMH atrial fibrillation on Eliquis prior to admission. Plan was for cholecystectomy drain placement, but was cancelled due to acute Afib. Last dose was > 2 days ago.   7/16 0201 aPTT 109 and heparin level 0.7.  7/16 0949 aPTT 100, therapeutic x 1 7/16 2246 aPTT 128  7/17 0851 aPTT HL 0.38 - no note written. Heparin was stopped around 0910. Restarted 1543 7/18 0013 aPTT 128 HL 0.38.  7/18 0839 HL 0.33 7/19 0423 HL 0.25   Goal of  Therapy:  HL 0.3 - 0.7  Monitor platelets by anticoagulation protocol: Yes   Plan:  Heparin level is subtherapeutic. Will give heparin bolus of 1100 units/hr and increase heparin infusion to 950 units/hr. Recheck heparin level in 8 hours. CBC daily while on heparin.    Ronnald Ramp, PharmD Clinical Pharmacist 10/02/2022 5:37 AM

## 2022-10-02 NOTE — Consult Note (Signed)
PHARMACY CONSULT NOTE - FOLLOW UP  Pharmacy Consult for Electrolyte Monitoring and Replacement   Recent Labs: Potassium (mmol/L)  Date Value  10/02/2022 3.0 (L)  09/24/2013 3.8   Magnesium (mg/dL)  Date Value  13/10/6576 2.4   Calcium (mg/dL)  Date Value  46/96/2952 6.9 (L)   Calcium, Total (mg/dL)  Date Value  84/13/2440 8.8   Albumin (g/dL)  Date Value  01/10/2535 2.0 (L)   Phosphorus (mg/dL)  Date Value  64/40/3474 4.4   Sodium (mmol/L)  Date Value  10/02/2022 133 (L)  04/09/2021 141  09/24/2013 141   Corrected Calcium: 8.5  Assessment: 77 y.o. 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  He presented to the [facility] ED 2 days later with abdominal pain and dyspnea.  Imaging has shown progressive cholecystitis, now with possible emphysematous changes and perforation with perihepatic fluid.  With this, he has progressed in septic shock and multiorgan failure. Pharmacy has been consulted to monitor and replace electrolytes while under PCCM care.  Goal of Therapy:  Electrolytes WNL  Plan:  K 3.0: Kcl x 5 doses Will recheck electrolytes with AM labs   Bettey Costa ,PharmD Clinical Pharmacist 10/02/2022 7:43 AM

## 2022-10-02 NOTE — Progress Notes (Signed)
Palliative Medicine [facility] at [facility] Telephone:([phone]) [phone] [phone]   Name: Cameron Gutierrez Date: 10/02/2022 MRN: [id number]  DOB: 08/18/1945  Patient Care Team: Dondra Spry, MD as PCP - General (Internal Medicine) Clinic, General Medical (Family Medicine)    REASON FOR CONSULTATION: Cameron Gutierrez is a 77 y.o. male with multiple medical problems including diabetes, hypertension, CKD stage II, A-fib on Eliquis, who was recently hospitalized at [facility] 09/21/2022 to 09/25/2022 for abdominal pain and weight loss with workup consistent with metastatic adenocarcinoma of the pancreas. Patient readmitted here on 09/26/2022 with intractable abdominal pain. Was found to have acalculus cholecystitis. Hospitalization has been complicated by A-fib. Palliative care was consulted to address goals and manage ongoing symptoms. .    CODE STATUS: DNR  PAST MEDICAL HISTORY: Past Medical History:  Diagnosis Date   Atrial fibrillation (HCC)    Cancer (HCC)    pancreatic   Diabetes mellitus without complication (HCC)    Hypercholesteremia    Hypertension     PAST SURGICAL HISTORY:  Past Surgical History:  Procedure Laterality Date   KNEE SURGERY     REPLACEMENT TOTAL KNEE Right    SHOULDER SURGERY      HEMATOLOGY/ONCOLOGY HISTORY:  Oncology History   No history exists.    ALLERGIES:  has No Known Allergies.  MEDICATIONS:  Current Facility-Administered Medications  Medication Dose Route Frequency Provider Last Rate Last Admin   0.9 %  sodium chloride infusion  250 mL Intravenous Continuous Ezequiel Essex, NP   Held at 09/29/22 1140   albumin human 25 % solution 25 g  25 g Intravenous Daily Vida Rigger, MD   Stopped at 10/01/22 1900   amiodarone (NEXTERONE PREMIX) 360-4.14 MG/200ML-% (1.8 mg/mL) IV infusion  30 mg/hr Intravenous Continuous Enedina Finner, MD 16.67 mL/hr at 10/02/22 0615 30 mg/hr at 10/02/22 0615   atorvastatin (LIPITOR) tablet  40 mg  40 mg Oral q morning Enedina Finner, MD   40 mg at 10/01/22 1058   Chlorhexidine Gluconate Cloth 2 % PADS 6 each  6 each Topical QHS Enedina Finner, MD   6 each at 10/01/22 2116   docusate sodium (COLACE) capsule 100 mg  100 mg Oral BID Enedina Finner, MD   100 mg at 09/26/22 2243   feeding supplement (ENSURE ENLIVE / ENSURE PLUS) liquid 237 mL  237 mL Oral TID BM Enedina Finner, MD   237 mL at 09/26/22 2247   fentaNYL (SUBLIMAZE) injection 50 mcg  50 mcg Intravenous Q4H PRN Enedina Finner, MD   50 mcg at 10/01/22 1752   heparin ADULT infusion 100 units/mL (25000 units/283mL)  950 Units/hr Intravenous Continuous Ronnald Ramp, RPH 9.5 mL/hr at 10/02/22 0623 950 Units/hr at 10/02/22 0623   insulin aspart (novoLOG) injection 0-9 Units  0-9 Units Subcutaneous Q4H Rust-Chester, Britton L, NP   1 Units at 10/02/22 0842   iohexol (OMNIPAQUE) 9 MG/ML oral solution 500 mL  500 mL Oral Once PRN Ezequiel Essex, NP   500 mL at 09/29/22 1616   levalbuterol (XOPENEX) nebulizer solution 0.63 mg  0.63 mg Nebulization Q6H PRN Ezequiel Essex, NP       metoprolol tartrate (LOPRESSOR) injection 5 mg  5 mg Intravenous Q4H PRN Enedina Finner, MD   5 mg at 09/28/22 1456   midodrine (PROAMATINE) tablet 10 mg  10 mg Oral TID WC Harlon Ditty D, NP       multivitamin with minerals tablet 1 tablet  1 tablet Oral Daily Enedina Finner, MD   1 tablet at 09/30/22 0831   ondansetron (ZOFRAN) tablet 4 mg  4 mg Oral Q6H PRN Enedina Finner, MD       Or   ondansetron [facility]) injection 4 mg  4 mg Intravenous Q6H PRN Enedina Finner, MD   4 mg at 09/28/22 1638   oxyCODONE (Oxy IR/ROXICODONE) immediate release tablet 10 mg  10 mg Oral Q4H PRN Enedina Finner, MD   10 mg at 09/29/22 2215   phenylephrine (NEO-SYNEPHRINE) 20mg /NS premix infusion  25-200 mcg/min Intravenous Titrated Ezequiel Essex, NP 7.5 mL/hr at 10/02/22 0615 10 mcg/min at 10/02/22 0615   piperacillin-tazobactam (ZOSYN) IVPB 3.375 g  3.375 g Intravenous Q8H Rust-Chester, Britton L, NP  12.5 mL/hr at 10/02/22 0615 Infusion Verify at 10/02/22 0615   polyethylene glycol (MIRALAX / GLYCOLAX) packet 17 g  17 g Oral Daily PRN Enedina Finner, MD       potassium chloride 10 mEq in 100 mL IVPB  10 mEq Intravenous Q1 Hr x 5 Nazari, Walid A, RPH 100 mL/hr at 10/02/22 0845 10 mEq at 10/02/22 0845   promethazine (PHENERGAN) 12.5 mg in sodium chloride 0.9 % 50 mL IVPB  12.5 mg Intravenous Q6H PRN Enedina Finner, MD       sodium chloride flush (NS) 0.9 % injection 5 mL  5 mL Intracatheter Q8H Suttle, Dylan J, MD   5 mL at 10/02/22 0505   traMADol (ULTRAM) tablet 50 mg  50 mg Oral Q6H PRN Enedina Finner, MD   50 mg at 09/27/22 2252   traZODone (DESYREL) tablet 25 mg  25 mg Oral QHS PRN Enedina Finner, MD   25 mg at 09/27/22 2253    VITAL SIGNS: BP (!) 89/52   Pulse 89   Temp 98.5 F (36.9 C) (Oral)   Resp 16   Ht 6' (1.829 m)   Wt 180 lb 8.9 oz (81.9 kg)   SpO2 97%   BMI 24.49 kg/m  Filed Weights   09/26/22 0519 10/01/22 0500 10/02/22 0420  Weight: 163 lb (73.9 kg) 175 lb 4.3 oz (79.5 kg) 180 lb 8.9 oz (81.9 kg)    Estimated body mass index is 24.49 kg/m as calculated from the following:   Height as of this encounter: 6' (1.829 m).   Weight as of this encounter: 180 lb 8.9 oz (81.9 kg).  LABS: CBC:    Component Value Date/Time   WBC 9.0 10/02/2022 0423   HGB 10.3 (L) 10/02/2022 0423   HGB 14.6 09/24/2013 0122   HCT 28.7 (L) 10/02/2022 0423   HCT 43.7 09/24/2013 0122   PLT 187 10/02/2022 0423   PLT 170 09/24/2013 0122   MCV 77.2 (L) 10/02/2022 0423   MCV 87 09/24/2013 0122   NEUTROABS 5.5 09/24/2013 0122   LYMPHSABS 1.4 09/24/2013 0122   MONOABS 0.7 09/24/2013 0122   EOSABS 0.3 09/24/2013 0122   BASOSABS 0.0 09/24/2013 0122   Comprehensive Metabolic Panel:    Component Value Date/Time   NA 133 (L) 10/02/2022 0423   NA 141 04/09/2021 1427   NA 141 09/24/2013 0122   K 3.0 (L) 10/02/2022 0423   K 3.8 09/24/2013 0122   CL 100 10/02/2022 0423   CL 105 09/24/2013 0122   CO2  22 10/02/2022 0423   CO2 28 09/24/2013 0122   BUN 76 (H) 10/02/2022 0423   BUN 24 04/09/2021 1427   BUN 29 (H) 09/24/2013 0122   CREATININE 2.49 (H) 10/02/2022  0423   CREATININE 1.50 (H) 09/24/2013 0122   GLUCOSE 119 (H) 10/02/2022 0423   GLUCOSE 127 (H) 09/24/2013 0122   CALCIUM 6.9 (L) 10/02/2022 0423   CALCIUM 8.8 09/24/2013 0122   AST 424 (H) 09/30/2022 0334   ALT 174 (H) 09/30/2022 0334   ALKPHOS 277 (H) 09/30/2022 0334   BILITOT 1.6 (H) 09/30/2022 0334   PROT 5.2 (L) 09/30/2022 0334   ALBUMIN 2.0 (L) 10/02/2022 0423    RADIOGRAPHIC STUDIES: DG Abd 1 View  Result Date: 09/30/2022 CLINICAL DATA:  NG tube placement EXAM: ABDOMEN - 1 VIEW COMPARISON:  CT abdomen/pelvis 1 day prior FINDINGS: The enteric catheter tip and sidehole are in the stomach. The stomach remains significantly distended. Air in the gallbladder lumen and wall is noted consistent with the findings of emphysematous cholecystitis seen on CT from 1 day prior. The free intraperitoneal air seen on that study is not definitely seen on this study. There is gaseous distention of the bowel in the imaged abdomen. A biliary stent is noted. IMPRESSION: 1. Enteric catheter tip and sidehole in the stomach which remains distended. 2. Air in the gallbladder lumen and wall consistent with the findings of emphysematous cholecystitis on the CT study from 1 day prior. The pneumoperitoneum seen on that study is not appreciated on the current study. Electronically Signed   By: Lesia Hausen M.D.   On: 09/30/2022 13:28   CT PERC CHOLECYSTOSTOMY  Result Date: 09/30/2022 INDICATION: 77 year old male with history recently diagnosed advanced stage pancreatic cancer with biliary obstruction status post covered common bile duct stent placement at outside facility approximally 1 week ago, now with emphysematous/ruptured acute cholecystitis with associated perihepatic fluid collection. EXAM: CT PERC DRAIN PERITONEAL ABCESS; CT PERCUTANEOUS  CHOLECYSTOSTOMY COMPARISON:  None Available. MEDICATIONS: The patient is currently admitted to the hospital and receiving intravenous antibiotics. The antibiotics were administered within an appropriate time frame prior to the initiation of the procedure. ANESTHESIA/SEDATION: Moderate (conscious) sedation was employed during this procedure. A total of Versed 0.5 mg and Fentanyl 25 mcg was administered intravenously. Moderate Sedation Time: 16 minutes. The patient's level of consciousness and vital signs were monitored continuously by radiology nursing throughout the procedure under my direct supervision. CONTRAST:  None COMPLICATIONS: None immediate. PROCEDURE: RADIATION DOSE REDUCTION: This exam was performed according to the departmental dose-optimization program which includes automated exposure control, adjustment of the mA and/or kV according to patient size and/or use of iterative reconstruction technique. Informed written consent was obtained from the patient after a discussion of the risks, benefits and alternatives to treatment. The patient was placed supine on the CT gantry and a pre procedural CT was performed re-demonstrating the known abscess/fluid collection within the gallbladder and perihepatic region. The procedure was planned. A timeout was performed prior to the initiation of the procedure. The right upper quadrant was prepped and draped in the usual sterile fashion. The overlying soft tissues were anesthetized with 1% lidocaine with epinephrine. Appropriate trajectories were planned with the use of a 22 gauge spinal needle. Two separate 18 gauge trocar needles were advanced into the gallbladder as well as into the perihepatic fluid collection and short Amplatz super stiff wires were coiled within the gallbladder and perihepatic fluid collection. Appropriate positioning was confirmed with a limited CT scan. The tracks were serially dilated allowing placement of 2, 10 French all-purpose drainage  catheters. Appropriate positioning was confirmed with a limited postprocedural CT scan. Approximately 100 ml of bilious and purulent fluid was aspirated from the gallbladder  in a proximally 500 mL of similar appearing, bilious and purulent fluid was aspirated from the perihepatic fluid collection. The drains were connected to a bulb suction and sutured in place. Dressings were placed. The patient tolerated the procedure well without immediate post procedural complication. IMPRESSION: 1. Successful CT guided placement of a 10 French percutaneous cholecystostomy drain with aspiration of 100 mL of bilious and purulent fluid. Samples were sent to the laboratory as requested by the ordering clinical team. 2. Successful CT guided placement of a 10 French right upper quadrant perihepatic fluid collection drain with aspiration of 500 mL of bilious and purulent fluid. Marliss Coots, MD [facility] Electronically Signed   By: Marliss Coots M.D.   On: 09/30/2022 13:19   CT GUIDED PERITONEAL/RETROPERITONEAL FLUID DRAIN BY PERC CATH  Result Date: 09/30/2022 INDICATION: 77 year old male with history recently diagnosed advanced stage pancreatic cancer with biliary obstruction status post covered common bile duct stent placement at outside facility approximally 1 week ago, now with emphysematous/ruptured acute cholecystitis with associated perihepatic fluid collection. EXAM: CT PERC DRAIN PERITONEAL ABCESS; CT PERCUTANEOUS CHOLECYSTOSTOMY COMPARISON:  None Available. MEDICATIONS: The patient is currently admitted to the hospital and receiving intravenous antibiotics. The antibiotics were administered within an appropriate time frame prior to the initiation of the procedure. ANESTHESIA/SEDATION: Moderate (conscious) sedation was employed during this procedure. A total of Versed 0.5 mg and Fentanyl 25 mcg was administered intravenously. Moderate Sedation Time: 16 minutes.  The patient's level of consciousness and vital signs were monitored continuously by radiology nursing throughout the procedure under my direct supervision. CONTRAST:  None COMPLICATIONS: None immediate. PROCEDURE: RADIATION DOSE REDUCTION: This exam was performed according to the departmental dose-optimization program which includes automated exposure control, adjustment of the mA and/or kV according to patient size and/or use of iterative reconstruction technique. Informed written consent was obtained from the patient after a discussion of the risks, benefits and alternatives to treatment. The patient was placed supine on the CT gantry and a pre procedural CT was performed re-demonstrating the known abscess/fluid collection within the gallbladder and perihepatic region. The procedure was planned. A timeout was performed prior to the initiation of the procedure. The right upper quadrant was prepped and draped in the usual sterile fashion. The overlying soft tissues were anesthetized with 1% lidocaine with epinephrine. Appropriate trajectories were planned with the use of a 22 gauge spinal needle. Two separate 18 gauge trocar needles were advanced into the gallbladder as well as into the perihepatic fluid collection and short Amplatz super stiff wires were coiled within the gallbladder and perihepatic fluid collection. Appropriate positioning was confirmed with a limited CT scan. The tracks were serially dilated allowing placement of 2, 10 French all-purpose drainage catheters. Appropriate positioning was confirmed with a limited postprocedural CT scan. Approximately 100 ml of bilious and purulent fluid was aspirated from the gallbladder in a proximally 500 mL of similar appearing, bilious and purulent fluid was aspirated from the perihepatic fluid collection. The drains were connected to a bulb suction and sutured in place. Dressings were placed. The patient tolerated the procedure well without immediate post  procedural complication. IMPRESSION: 1. Successful CT guided placement of a 10 French percutaneous cholecystostomy drain with aspiration of 100 mL of bilious and purulent fluid. Samples were sent to the laboratory as requested by the ordering clinical team. 2. Successful CT guided placement of a 10 French right upper quadrant perihepatic fluid collection drain with aspiration of 500 mL of bilious and  purulent fluid. Marliss Coots, MD [facility]iology Electronically Signed   By: Marliss Coots M.D.   On: 09/30/2022 13:19   CT ABDOMEN PELVIS WO CONTRAST  Addendum Date: 09/29/2022   ADDENDUM REPORT: 09/29/2022 23:18 ADDENDUM: These results were called by telephone at the time of interpretation on 09/29/2022 at 11:14 pm to provider Dr. Everlene Farrier, who verbally acknowledged these results. Electronically Signed   By: Tish Frederickson M.D.   On: 09/29/2022 23:18   Result Date: 09/29/2022 CLINICAL DATA:  Bowel obstruction suspected EXAM: CT ABDOMEN AND PELVIS WITHOUT CONTRAST TECHNIQUE: Multidetector CT imaging of the abdomen and pelvis was performed following the standard protocol without IV contrast. RADIATION DOSE REDUCTION: This exam was performed according to the departmental dose-optimization program which includes automated exposure control, adjustment of the mA and/or kV according to patient size and/or use of iterative reconstruction technique. COMPARISON:  CT abdomen pelvis 09/26/2022 FINDINGS: Lower chest: Partially visualized bilateral small pleural effusions with bilateral lower lobe passive atelectasis. Hepatobiliary: Redemonstration of a couple of hepatic lesions consistent with known metastases. Air-fluid level within the gastric lumen. Air noted within the wall of the gallbladder. Common bile duct stent in appropriate position with associated pneumobilia. Pancreas: Proximal pancreatic mass again noted in difficult to delineate on this study. Finding  measures approximately 12.6 x 4.7 cm extending along the liver and duodenum. No surrounding inflammatory changes. No main pancreatic ductal dilatation. Spleen: Normal in size without focal abnormality. Adrenals/Urinary Tract: No adrenal nodule bilaterally. No nephrolithiasis and no hydronephrosis. Several fluid density lesions likely represent simple renal cysts. Simple renal cysts, in the absence of clinically indicated signs/symptoms, require no independent follow-up. No ureterolithiasis or hydroureter. The urinary bladder is decompressed with Foley catheter tip and balloon terminating within the lumen. Stomach/Bowel: Stomach distended with air-fluid level. Bowel thickening of the first portion of the duodenum suggestive of possible metastatic invasion. No evidence of large bowel wall thickening or dilatation. Colonic diverticulosis. Appendix appears normal. Vascular/Lymphatic: No abdominal aorta or iliac aneurysm. Moderate atherosclerotic plaque of the aorta and its branches. No abdominal, pelvic, or inguinal lymphadenopathy. Reproductive: Prostate is unremarkable. Other: Interval increase in trace to small volume simple free fluid within the right upper quadrant. Associated free gas. No intraperitoneal free gas. No organized fluid collection. Musculoskeletal: Bilateral inguinal hernias containing fat and fluid. Subcutaneus soft tissue edema. No suspicious lytic or blastic osseous lesions. No acute displaced fracture. Multilevel degenerative changes of the spine. IMPRESSION: 1. Acute emphysematous cholecystitis. Recommend emergent surgical consultation. 2. Interval development of pneumoperitoneum. 3. Interval increase in small volume perihepatic ascites. 4. Gastric lumen distended with air-fluid level. Developing obstruction along the first portion of duodenum in the region of the pancreatic malignancy not excluded. 5. Metastatic pancreatic malignancy poorly evaluated on this noncontrast study. Please see CT  abdomen pelvis 09/26/2022 for further details. 6. Partially visualized bilateral small pleural effusions with bilateral lower lobe passive atelectasis. 7.  Aortic Atherosclerosis (ICD10-I70.0). These results were called by telephone at the time of interpretation on 09/29/2022 at 10:37 pm to provider Dr. Karna Christmas, who verbally acknowledged these results. Electronically Signed: By: Tish Frederickson M.D. On: 09/29/2022 22:44   US THORACENTESIS ASP PLEURAL SPACE W/IMG GUIDE  Result Date: 09/29/2022 INDICATION: Right pleural effusion.  Pancreatic cancer. EXAM: ULTRASOUND GUIDED RIGHT THORACENTESIS MEDICATIONS: None. COMPLICATIONS: None immediate. PROCEDURE: An ultrasound guided thoracentesis was thoroughly discussed with the patient and questions answered. The benefits, risks, alternatives and complications were also discussed. The patient understands and wishes to  proceed with the procedure. Written consent was obtained. Ultrasound was performed to localize and mark an adequate pocket of fluid in the right chest. The area was then prepped and draped in the normal sterile fashion. 1% Lidocaine was used for local anesthesia. Under ultrasound guidance a 6 Fr Safe-T-Centesis catheter was introduced. Thoracentesis was performed. The catheter was removed and a dressing applied. FINDINGS: A total of approximately 800 mL of dark yellow fluid was removed. Samples were sent to the laboratory as requested by the clinical team. Images were also obtained of the right upper abdomen to evaluate the gallbladder. Loculated perihepatic ascites is identified. Previously identified gallbladder is poorly characterized because there appears to be air within the gallbladder. The amount of fluid around the liver has significantly increased since the right upper quadrant ultrasound 06/27/2022. IMPRESSION: 1. Successful ultrasound guided right thoracentesis yielding 800 mL of pleural fluid. 2. Images of the right upper quadrant demonstrate  markedly increased perihepatic ascites. Ascites appears to be complex. In addition, the gallbladder now appears to contain a large amount of gas which is likely related to the biliary stent and pneumobilia. Electronically Signed   By: Richarda Overlie M.D.   On: 09/29/2022 15:52   DG Chest Port 1 View  Result Date: 09/29/2022 CLINICAL DATA:  Pleural effusion.  Status post thoracentesis. EXAM: PORTABLE CHEST 1 VIEW COMPARISON:  Chest radiographs 09/28/2022 and 09/26/2022. Abdominal CT 09/26/2022. FINDINGS: 1417 hours. Interval decreased volume of right pleural effusion. A left pleural effusion and left basilar airspace disease appear unchanged. No pneumothorax. The heart size and mediastinal contours are stable. Moderate gaseous distension of the stomach noted. Curvilinear lucency in the right upper quadrant corresponds with air in the gallbladder lumen on recent CT. Patient has a biliary stent. IMPRESSION: 1. Decreased right pleural effusion following thoracentesis. No pneumothorax. 2. No significant change in left pleural effusion and left basilar airspace disease. Electronically Signed   By: Carey Bullocks M.D.   On: 09/29/2022 15:46   ECHOCARDIOGRAM COMPLETE  Result Date: 09/29/2022    ECHOCARDIOGRAM REPORT   Patient Name:   Cameron Gutierrez Date of Exam: 09/29/2022 Medical Rec #:  [id number]       Height:       72.0 in Accession #:    [id number]      Weight:       163.0 lb Date of Birth:  [birth year]       BSA:          1.953 m Patient Age:    76 years        BP:           112/74 mmHg Patient Gender: M               HR:           115 bpm. Exam Location:  [facility] Procedure: 2D Echo, Cardiac Doppler and Color Doppler Indications:     Atrial Fibrillation I48.91  History:         Patient has no prior history of Echocardiogram examinations.                  Arrythmias:Atrial Fibrillation; Risk Factors:Diabetes and                  Hypertension.  Sonographer:     Cristela Blue Referring Phys:  [id number] SONA PATEL Diagnosing  Phys: Yvonne Kendall MD IMPRESSIONS  1. Left ventricular ejection fraction, by estimation, is 60 to 65%. The left ventricle has  normal function. The left ventricle has no regional wall motion abnormalities. There is mild asymmetric left ventricular hypertrophy of the basal-septal segment. Left ventricular diastolic function could not be evaluated.  2. Pulmonary artery pressure is mildly elevated (RVSP 30-35 mmHg plus central venous/right atrial pressure). Right ventricular systolic function is normal. The right ventricular size is normal.  3. Moderate pleural effusion in the right lateral region.  4. The mitral valve is normal in structure. Mild to moderate mitral valve regurgitation.  5. Tricuspid valve regurgitation is mild to moderate.  6. The aortic valve is tricuspid. Aortic valve regurgitation is mild. No aortic stenosis is present. FINDINGS  Left Ventricle: Left ventricular ejection fraction, by estimation, is 60 to 65%. The left ventricle has normal function. The left ventricle has no regional wall motion abnormalities. The left ventricular internal cavity size was normal in size. There is  mild asymmetric left ventricular hypertrophy of the basal-septal segment. Left ventricular diastolic function could not be evaluated due to atrial fibrillation. Left ventricular diastolic function could not be evaluated. Right Ventricle: Pulmonary artery pressure is mildly elevated (RVSP 30-35 mmHg plus central venous/right atrial pressure). The right ventricular size is normal. No increase in right ventricular wall thickness. Right ventricular systolic function is normal. Left Atrium: Left atrial size was normal in size. Right Atrium: Right atrial size was normal in size. Pericardium: The pericardium was not well visualized. Mitral Valve: The mitral valve is normal in structure. Mild to moderate mitral valve regurgitation. Tricuspid Valve: The tricuspid valve is normal in structure. Tricuspid valve regurgitation is mild  to moderate. Aortic Valve: The aortic valve is tricuspid. Aortic valve regurgitation is mild. No aortic stenosis is present. Aortic valve mean gradient measures 3.0 mmHg. Aortic valve peak gradient measures 4.7 mmHg. Aortic valve area, by VTI measures 3.27 cm. Pulmonic Valve: The pulmonic valve was grossly normal. Pulmonic valve regurgitation is trivial. No evidence of pulmonic stenosis. Aorta: The aortic root is normal in size and structure. Pulmonary Artery: The pulmonary artery is not well seen. Venous: The inferior vena cava was not well visualized. IAS/Shunts: The interatrial septum was not well visualized. Additional Comments: There is a moderate pleural effusion in the right lateral region.  LEFT VENTRICLE PLAX 2D LVIDd:         3.90 cm LVIDs:         2.60 cm LV PW:         0.90 cm LV IVS:        1.20 cm LVOT diam:     2.20 cm LV SV:         47 LV SV Index:   24 LVOT Area:     3.80 cm  RIGHT VENTRICLE RV Basal diam:  3.50 cm RV Mid diam:    2.80 cm LEFT ATRIUM             Index        RIGHT ATRIUM           Index LA diam:        4.30 cm 2.20 cm/m   RA Area:     16.20 cm LA Vol (A2C):   70.1 ml 35.89 ml/m  RA Volume:   34.00 ml  17.41 ml/m LA Vol (A4C):   47.7 ml 24.42 ml/m LA Biplane Vol: 60.7 ml 31.08 ml/m  AORTIC VALVE AV Area (Vmax):    2.81 cm AV Area (Vmean):   2.73 cm AV Area (VTI):     3.27 cm AV Vmax:  108.00 cm/s AV Vmean:          74.350 cm/s AV VTI:            0.143 m AV Peak Grad:      4.7 mmHg AV Mean Grad:      3.0 mmHg LVOT Vmax:         79.80 cm/s LVOT Vmean:        53.400 cm/s LVOT VTI:          0.123 m LVOT/AV VTI ratio: 0.86  AORTA Ao Root diam: 3.66 cm MITRAL VALVE               TRICUSPID VALVE MV Area (PHT): 3.63 cm    TR Peak grad:   32.9 mmHg MV Decel Time: 209 msec    TR Vmax:        287.00 cm/s MV E velocity: 86.50 cm/s                            SHUNTS                            Systemic VTI:  0.12 m                            Systemic Diam: 2.20 cm Yvonne Kendall MD Electronically signed by Yvonne Kendall MD Signature Date/Time: 09/29/2022/2:31:41 PM    Final    US RENAL  Result Date: 09/29/2022 CLINICAL DATA:  Acute renal failure. EXAM: RENAL / URINARY TRACT ULTRASOUND COMPLETE COMPARISON:  September 26, 2022. FINDINGS: Right Kidney: Renal measurements: 9.6 x 5.1 x 4.7 cm = volume: 122 mL. At least 2 parapelvic cysts are noted, the largest measuring 2.3 cm. Increased echogenicity of renal parenchyma is noted. No mass or hydronephrosis visualized. Left Kidney: Renal measurements: 10.1 x 5.9 x 5.5 cm = volume: 170 mL. Multi septated cyst measuring 2.3 x 2.3 x 1.9 cm is noted. 2.5 cm simple cyst is noted. Increased echogenicity of renal parenchyma is noted. No hydronephrosis visualized. Bladder: Appears normal for degree of bladder distention. Other: None. IMPRESSION: 2.3 cm multi-septated complex cyst seen in left kidney. Follow-up ultrasound in 6-12 months is recommended to ensure stability. Increased echogenicity of renal parenchyma is noted bilaterally suggesting medical renal disease. No hydronephrosis or renal obstruction is noted. Electronically Signed   By: Lupita Raider M.D.   On: 09/29/2022 13:28   DG Chest Port 1 View  Result Date: 09/28/2022 CLINICAL DATA:  Shortness of breath. EXAM: PORTABLE CHEST 1 VIEW COMPARISON:  September 26, 2022 FINDINGS: Calcific atherosclerotic disease of the aorta. Cardiomediastinal silhouette is enlarged. Mediastinal contours appear intact. Bilateral pleural effusions, right greater than left. No focal airspace consolidation. Osseous structures are without acute abnormality. Soft tissues are grossly normal. IMPRESSION: 1. Bilateral pleural effusions, right greater than left. 2. Enlarged cardiomediastinal silhouette. Electronically Signed   By: Ted Mcalpine M.D.   On: 09/28/2022 15:37   US Abdomen Limited RUQ (LIVER/GB)  Result Date: 09/26/2022 CLINICAL DATA:  Abdominal pain. History of metastatic pancreatic cancer. EXAM:  ULTRASOUND ABDOMEN LIMITED RIGHT UPPER QUADRANT COMPARISON:  CT abdomen pelvis from same day. FINDINGS: Gallbladder: No gallstones. Sludge with mild gallbladder wall thickening and small volume pericholecystic fluid. Positive sonographic Murphy sign noted by sonographer. Common bile duct: Diameter: 4 mm, normal.  Common bile duct stent noted. Liver: Liver  lesions seen on CT are not well identified by ultrasound. Within normal limits in parenchymal echogenicity. Portal vein is patent on color Doppler imaging with normal direction of blood flow towards the liver. Other: None. IMPRESSION: 1. Gallbladder sludge with mild gallbladder wall thickening and small volume pericholecystic fluid. Positive sonographic Murphy sign. Findings are concerning for acute acalculous cholecystitis. 2. Liver metastases seen on CT are not well identified by ultrasound. Electronically Signed   By: Obie Dredge M.D.   On: 09/26/2022 14:56   CT ABDOMEN PELVIS W CONTRAST  Result Date: 09/26/2022 CLINICAL DATA:  Central chest pain. Abdominal pain. History of pancreatic cancer. EXAM: CT ABDOMEN AND PELVIS WITH CONTRAST TECHNIQUE: Multidetector CT imaging of the abdomen and pelvis was performed using the standard protocol following bolus administration of intravenous contrast. RADIATION DOSE REDUCTION: This exam was performed according to the departmental dose-optimization program which includes automated exposure control, adjustment of the mA and/or kV according to patient size and/or use of iterative reconstruction technique. CONTRAST:  80mL OMNIPAQUE IOHEXOL 300 MG/ML  SOLN COMPARISON:  Chest radiograph 09/26/2022. Outside CTs from [facility] dated 09/21/2022 and 09/22/2022. Outside images are unavailable but reports are available. FINDINGS: Lower chest: Multiple small pulmonary nodules in the visualized lungs. Index pulmonary nodule is in the right lower lobe along the right major fissure on image 33/4 measuring up to 1.3 cm. There also may be  a larger nodule in the left lower lobe measuring 1.4 cm image 15/4. Volume loss and compressive atelectasis in the left lower lobe. Compressive atelectasis in the right lower lobe. Bilateral small pleural effusions. Hepatobiliary: Multiple ill-defined hypoechoic liver lesions compatible with metastatic disease. Index lesion in the left hepatic lobe measuring 2.1 cm on image 23/2. There is gas and contrast within the gallbladder. The gallbladder is moderately distended and findings compatible with recent ERCP. Metallic biliary stent in the common bile duct. Narrowing in the biliary stent associated with the pancreatic mass. Small amount of pneumobilia. Small amount of fluid and stranding around the liver and gallbladder. Pancreas: Large poorly defined pancreatic head mass with multiple hypoechoic components. Difficult to accurately measure the mass itself but it roughly measures 4.0 x 4.9 x 5.1 cm. Dilatation of the main pancreatic duct. The mass extends cephalad and appears to be involving the caudate lobe. There is a caudate lesion measuring up to 4.1 cm. In addition, there is hypoechoic implant or fluid collection cephalad to the caudate lobe involving the hepatic subcapsular space. This subcapsular collection or implant measures 7.8 x 4.6 x 5.6 cm. This was present on the previous exam based on the prior report. This caudate/subcapsular component causing mass effect on the adjacent IVC. Mild stranding and edema throughout the porta hepatis region. Spleen: Small splenic calcifications.  Spleen is normal for size. Adrenals/Urinary Tract: Normal adrenal glands. Bilateral cortical and parapelvic renal cysts. No suspicious renal lesions. No hydronephrosis. Bladder is decompressed. There is probably a small bladder diverticulum along the posterior aspect of the bladder. Cannot exclude mild bladder wall thickening. Asymmetric left perinephric stranding. Stomach/Bowel: Normal appearance of the stomach. No bowel  dilatation. Colonic diverticula involving the sigmoid colon. No evidence for acute bowel inflammation. Vascular/Lymphatic: Atherosclerotic disease involving the abdominal aorta without aneurysm. Atherosclerotic calcifications at the origin of the SMA. Atherosclerotic calcifications at the origin of the bilateral renal arteries. IVC is patent but there is compression in the suprarenal IVC due to the subcapsular fluid collection / mass. 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 Main portal vein is  small but patent. Intrahepatic portal veins are patent. 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 Splenic vein is patent. Pathologic lymphadenopathy in the retrocaval space measuring up to 1.7 cm on image 39/2. Reproductive: Prostate contains calcifications, asymmetric towards the left. Other: Small amount of free fluid in the abdomen and pelvis. Musculoskeletal: Evidence for an old right tenth rib fracture. Bilateral inguinal hernias containing fat. Small amount of fluid in the left inguinal hernia. No suspicious osseous lesion. IMPRESSION: 1. Large pancreatic head mass compatible with the patient's history of pancreatic cancer. The mass extends cephalad and involves the caudate lobe. There is also a subcapsular fluid collection or implant cephalad to the caudate lobe causing mass effect on the adjacent IVC. 2. Multiple liver lesions, abdominal lymphadenopathy and multiple pulmonary nodules. Findings compatible with metastatic disease. 3. Metallic biliary stent in the common bile duct and there is a small amount of pneumobilia. No significant intrahepatic biliary dilatation. In addition, there is moderate distention of the gallbladder containing iodinated contrast and small amount of gas. Findings compatible with recent ERCP. Gallbladder distension could be a source of abdominal pain. 4. Bilateral small pleural  effusions with compressive atelectasis in the lower lobes. 5. Small amount of free fluid in the abdomen and pelvis. 6. Colonic diverticulosis without evidence for acute diverticulitis. 7. Aortic Atherosclerosis (ICD10-I70.0). 8. 9 mm splenic artery aneurysm. Electronically Signed   By: Richarda Overlie M.D.   On: 09/26/2022 09:50   DG Chest 2 View  Result Date: 09/26/2022 CLINICAL DATA:  77 year old male with history of central chest pain. EXAM: CHEST - 2 VIEW COMPARISON:  Chest x-ray 07/28/2014. FINDINGS: Lung volumes are low. No consolidative airspace disease. Trace bilateral pleural effusions with bibasilar opacities that are favored to reflect subsegmental atelectasis. No pneumothorax. No evidence of pulmonary edema. No definite suspicious appearing pulmonary nodules or masses are noted. Heart size is normal. Upper mediastinal contours are within normal limits. Atherosclerotic calcifications in the thoracic aorta. IMPRESSION: 1. Low lung volumes with trace bilateral pleural effusions and bibasilar opacities favored to reflect areas of subsegmental atelectasis. 2. Aortic atherosclerosis. Electronically Signed   By: Trudie Reed M.D.   On: 09/26/2022 06:19    PERFORMANCE STATUS (ECOG) : 4 - Bedbound  Review of Systems Unless otherwise noted, a complete review of systems is negative.  Physical Exam General: Frail HEENT: NGT Pulmonary: Unlabored Extremities: no edema, no joint deformities Skin: no rashes Neurological: Weakness but otherwise nonfocal  IMPRESSION: Patient with emphysematous cholecystitis with pneumoperitoneum.  He is status post thoracentesis and percutaneous cholecystostomy tube and perihepatic drain placement.  Follow-up visit.  Patient remains in ICU.  Renal function slightly better today. RN weaning pressors.   Patient having some pain. Discussed with RN.   I spoke with patient's wife and son. Both remain committed to current scope of treatment. However, they recognize that  patient is critically ill and may do poorly. We did discuss the possible option of hospice in the event that he declines or if it becomes apparent that there is not a viable path forward for cancer treatment.   PLAN: -Continue current scope of treatment -Agree with DNR -Will follow   Time Total: 25 minutes  Visit consisted of counseling and education dealing with the complex and emotionally intense issues of symptom management and palliative care in the setting of serious and potentially life-threatening illness.Greater than 50%  of this time  was spent counseling and coordinating care related to the above assessment and plan.  Signed by: Laurette Schimke, PhD, NP-C

## 2022-10-02 NOTE — Progress Notes (Signed)
NAME:  Cameron Gutierrez, MRN:  [id number], DOB:  1945/09/24, LOS: 5 ADMISSION DATE:  09/26/2022, CONSULTATION DATE: 09/28/2022 REFERRING MD: Dr. Allena Katz, CHIEF COMPLAINT: Chest Pain   History of Present Illness:  This is a 77 yo male who presented to [facility] ER on 07/13 with acute onset of severe abdominal/chest pain.  He was previously admitted at [facility] from 09/21/22-09/25/22 for abdominal pain along with unintentional weight loss.  During hospitalization at [facility] workup concerning for primary metastatic pancreatic adenocarcinoma.  He underwent ERCP/US with biopsy and stent placed on 09/23/22.  His pain was controlled post procedure, and pt discharged home on 09/25/22 (prescribed oxycodone and eliquis resumed).  ED Course Upon arrival to [facility] ER significant lab results were: Na+ 133/K+ 3.4/glucose 148/calcium 7.4/albumin 2.2/wbc 15.7.  CT Abd Pelvis concerning for moderate distension of the gallbladder but no findings of acute cholecystitis, however pt continued to c/o persistent abdominal pain.  EDP consulted with [facility] Oncologist Dr. Everardo All, and following review of imaging did not feel pt required transfer to [facility].  Pt subsequently admitted to [facility] medsurg unit per hospitalist team for additional workup and treatment due to continued abdominal pain.  See detailed hospital course below under significant events.  CT Abd/Pelvis W Contrast:  Large pancreatic head mass compatible with the patient's history of pancreatic cancer. The mass extends cephalad and involves the caudate lobe. There is also a subcapsular fluid collection or implant cephalad to the caudate lobe causing mass effect on the adjacent IVC. Multiple liver lesions, abdominal lymphadenopathy and multiple pulmonary nodules. Findings compatible with metastatic disease. Metallic biliary stent in the common bile duct and there is a small amount of pneumobilia. No significant intrahepatic biliary dilatation. In addition, there is moderate distention of  the gallbladder containing iodinated contrast and small amount of gas. Findings compatible with recent ERCP. Gallbladder distension could be a source of abdominal pain. Bilateral small pleural effusions with compressive atelectasis in the lower lobes. Small amount of free fluid in the abdomen and pelvis. Colonic diverticulosis without evidence for acute diverticulitis. Aortic Atherosclerosis (ICD10-I70.0). 9 mm splenic artery aneurysm.  CXR:  Low lung volumes with trace bilateral pleural effusions and bibasilar opacities favored to reflect areas of subsegmental atelectasis. Aortic atherosclerosis.  Pertinent  Medical History  Atrial Fibrillation  Primary Metastatic Pancreatic Adenocarcinoma  Type I Diabetes Mellitus  Hypercholesteremia  HTN   Significant Hospital Events: Including procedures, antibiotic start and stop dates in addition to other pertinent events   07/13: Pt admitted to the [facility] unit with diffuse abdominal pain following recent ERCP with stent placement and newly diagnosed primary pancreatic adenocarcinoma with metastasis 07/13: [place] Abd Limited RUQ revealed Gallbladder sludge with mild gallbladder           wall thickening and small volume pericholecystic fluid. Positive sonographic        Murphy sign. Findings are concerning for acute acalculous cholecystitis. Liver        metastases seen on CT are not well identified by ultrasound. 07/14: General surgery consulted for acute acalculous cholecystitis and recommended decompression of the gallbladder via percutaneous cholecystostomy per IR and continue abx therapy.  07/15: Pt arrived to special recovery for cholecystostomy drain placement and pt noted to be in atrial fibrillation with rvr heart rate 140 to 170's.  Cardiology notified pt received 5 mg of iv cardizem x1 dose/250 ml NS bolus/20 mg of iv lasix.  Procedure canceled and pt transferred to the stepdown unit for cardizem gtt.  PCCM team consulted  to assist with management.    09/29/22- patient in no distress, met with family today.  Awaiting percutaneous cholecystostomy and thoracentesis.  Testing for TLS today. 09/30/22- patient with emphysematous cholecystitis and pneumoperitoneum.  S/p throacentesis with partial clinical improvement.  Plan for IR consultation and cholecystostomy tube.   10/01/22-patient remains weak and chronically ill appearing.  He is s/p GB drain and plan today to remove NGT and start PO nourishment.  He is being seen by palliative cancer specialist. 10/02/22- patient had BM overnight no longer with bowel obstruction does have NG still. Remains in AF on heparin, but vitals improved with low dose neo now. Palliative oncology following.  Patient wishes to have cancer therapy if possible. WBC count is normalized today.  Anti-infectives (From admission, onward)    Start     Dose/Rate Route Frequency Ordered Stop   09/30/22 0000  piperacillin-tazobactam (ZOSYN) IVPB 3.375 g        3.375 g 12.5 mL/hr over 240 Minutes Intravenous Every 8 hours 09/29/22 2307     09/29/22 1800  Ampicillin-Sulbactam (UNASYN) 3 g in sodium chloride 0.9 % 100 mL IVPB  Status:  Discontinued        3 g 200 mL/hr over 30 Minutes Intravenous Every 8 hours 09/29/22 1425 09/29/22 2307   09/27/22 1000  Ampicillin-Sulbactam (UNASYN) 3 g in sodium chloride 0.9 % 100 mL IVPB  Status:  Discontinued        3 g 200 mL/hr over 30 Minutes Intravenous Every 6 hours 09/27/22 0844 09/29/22 1425       Interim History / Subjective:  Pt currently requiring neo-synephrine gtt @ 20 mcg/min to maintain map >65. Cardizem gtt not infusing at this time pt remains in atrial fibrillation heart rate 114 to 120's   Objective   Blood pressure (!) 89/52, pulse 89, temperature 98.5 F (36.9 C), temperature source Oral, resp. rate 16, height 6' (1.829 m), weight 81.9 kg, SpO2 97%.        Intake/Output Summary (Last 24 hours) at 10/02/2022 1013 Last data filed at 10/02/2022 0845 Gross per 24 hour   Intake 4759.19 ml  Output 2088 ml  Net 2671.19 ml   Filed Weights   09/26/22 0519 10/01/22 0500 10/02/22 0420  Weight: 73.9 kg 79.5 kg 81.9 kg    Examination: General: Acute on chronically-ill appearing male, NAD on 6L HFNC  HENT: Supple, no JVD  Lungs: Diminished throughout, even, non labored  Cardiovascular: Irregular irregular, no r/g, 2+ radial/1+ distal pulses, no edema   Abdomen: Hypoactive BS x4, mild abdominal tenderness with distention, taut  Extremities: Normal bulk and tone, moves all extremities  Neuro: Alert and oriented, following commands, PERRLA GU: Indwelling foley catheter in place   Resolved Hospital Problem list     Assessment & Plan:   #Acute hypoxic respiratory failure secondary secondary to bilateral pleural effusions and atelectasis  - Supplemental O2 for dyspnea and/or hypoxia  - Maintain O2 sats 92% or higher  - Prn bronchodilator therapy  - Aggressive pulmonary hygiene - Intermittent CXR and ABG's  - Consider thoracentesis  -s/p throacentesis- 900cc removed  -atelelctasis noted - IS and metaneb ordered , discussed with RT today   Acute Kidney Injury -KDIGO 3   - multifactorial including ischemic/metabolic -consider nephrology consultation  -IVF   #Atrial fibrillation with rvr  #Hypotension secondary to antiarrhythmic medication  - Continuous telemetry monitoring - Will check troponin and BNP  - Cardiology consulted appreciate input: recommending if renal function normal can consider digoxin,  however if pt has evidence of renal failure recommend amiodarone gtt due to cardizem gtt causing hypotension  - Echo pending  - Continue heparin gtt: dosing per pharmacy  - Prn neo-synephrine gtt to maintain map >65 - Hold outpatient antihypertensives   #Acalculous emphysematous cholecystitis  - Trend WBC and monitor fever curve  - Continue  abx therapy as outlined above  - General surgery consulted: recommended decompression of the gallbladder via  percutaneous cholecystostomy per IR and continue abx therapy.  Procedure canceled 07/15 due to pt developing atrial fibrillation with rvr  - Prn oxycodone/fentanyl/morphine for pain management  -zosyn-    #Primary metastatic adenocarcinoma of the pancreas  - Oncology consulted appreciate input   #Type I diabetes mellitus  - CBG's ac/hs - SSI   Best Practice (right click and "Reselect all SmartList Selections" daily)   Diet/type: clear liquids DVT prophylaxis: systemic heparin GI prophylaxis: N/A Lines: N/A Foley:  Yes, and it is still needed Code Status:  full code Last date of multidisciplinary goals of care discussion [N/A]  Labs   CBC: Recent Labs  Lab 09/28/22 1607 09/29/22 0201 09/30/22 0334 10/01/22 0448 10/02/22 0423  WBC 18.7* 20.7* 17.7* 14.1* 9.0  HGB 12.9* 12.8* 12.4* 12.1* 10.3*  HCT 36.5* 37.3* 34.5* 33.3* 28.7*  MCV 79.5* 81.3 77.5* 75.9* 77.2*  PLT 327 333 288 273 187    Basic Metabolic Panel: Recent Labs  Lab 09/28/22 1607 09/29/22 0949 09/30/22 0334 10/01/22 0448 10/01/22 2000 10/02/22 0423  NA 134* 134* 133* 133*  --  133*  K 4.4 3.7 3.7 3.0* 3.3* 3.0*  CL 96* 97* 98 98  --  100  CO2 23 20* 22 22  --  22  GLUCOSE 157* 176* 138* 132*  --  119*  BUN 70* 85* 89* 86*  --  76*  CREATININE 2.90* 3.17* 3.05* 2.96*  --  2.49*  CALCIUM 7.3* 7.0* 7.1* 6.9*  --  6.9*  MG  --  2.4 2.5* 2.5*  --  2.4  PHOS  --  8.8* 7.7* 6.7*  --  4.4   GFR: Estimated Creatinine Clearance: 28.1 mL/min (A) (by C-G formula based on SCr of 2.49 mg/dL (H)). Recent Labs  Lab 09/29/22 0201 09/30/22 0334 10/01/22 0448 10/02/22 0423  PROCALCITON  --   --  9.04 5.95  WBC 20.7* 17.7* 14.1* 9.0    Liver Function Tests: Recent Labs  Lab 09/26/22 0655 09/29/22 0949 09/30/22 0334 10/01/22 0448 10/02/22 0423  AST 32 222* 424*  --   --   ALT 24 106* 174*  --   --   ALKPHOS 113 192* 277*  --   --   BILITOT 1.1 1.6* 1.6*  --   --   PROT 5.4* 5.6* 5.2*  --   --    ALBUMIN 2.2* 2.0* 1.9* 1.7* 2.0*   Recent Labs  Lab 09/26/22 0655  LIPASE 19   No results for input(s): "AMMONIA" in the last 168 hours.  ABG    Component Value Date/Time   HCO3 27.9 03/26/2022 2105   O2SAT 22.9 03/26/2022 2105     Coagulation Profile: Recent Labs  Lab 09/27/22 1421 09/28/22 0916 09/29/22 0949 09/30/22 0334  INR 1.7* 1.7* 2.0* 2.0*    Cardiac Enzymes: No results for input(s): "CKTOTAL", "CKMB", "CKMBINDEX", "TROPONINI" in the last 168 hours.  HbA1C: Hgb A1c MFr Bld  Date/Time Value Ref Range Status  09/26/2022 12:54 PM 7.0 (H) 4.8 - 5.6 % Final    Comment:    (  NOTE)         Prediabetes: 5.7 - 6.4         Diabetes: >6.4         Glycemic control for adults with diabetes: <7.0   03/27/2022 05:49 AM 11.8 (H) 4.8 - 5.6 % Final    Comment:    (NOTE) Pre diabetes:          5.7%-6.4%  Diabetes:              >6.4%  Glycemic control for   <7.0% adults with diabetes     CBG: Recent Labs  Lab 10/01/22 1633 10/01/22 1929 10/01/22 2335 10/02/22 0312 10/02/22 0720  GLUCAP 113* 122* 117* 122* 124*    Review of Systems:   Gen: Denies fever, chills, weight change, fatigue, night sweats HEENT: Denies blurred vision, double vision, hearing loss, tinnitus, sinus congestion, rhinorrhea, sore throat, neck stiffness, dysphagia PULM: Denies shortness of breath, cough, sputum production, hemoptysis, wheezing CV: Denies chest pain, edema, orthopnea, paroxysmal nocturnal dyspnea, palpitations GI: abdominal pain, nausea, vomiting, diarrhea, hematochezia, melena, constipation, change in bowel habits GU: Denies dysuria, hematuria, polyuria, oliguria, urethral discharge Endocrine: Denies hot or cold intolerance, polyuria, polyphagia or appetite change Derm: Denies rash, dry skin, scaling or peeling skin change Heme: Denies easy bruising, bleeding, bleeding gums Neuro: Denies headache, numbness, weakness, slurred speech, loss of memory or consciousness    Past Medical History:  He,  has a past medical history of Atrial fibrillation (HCC), Cancer (HCC), Diabetes mellitus without complication (HCC), Hypercholesteremia, and Hypertension.   Surgical History:   Past Surgical History:  Procedure Laterality Date   KNEE SURGERY     REPLACEMENT TOTAL KNEE Right    SHOULDER SURGERY       Social History:   reports that he has never smoked. He has never used smokeless tobacco. He reports that he does not currently use alcohol. He reports that he does not use drugs.   Family History:  His family history is not on file.   Allergies No Known Allergies   Home Medications  Prior to Admission medications   Medication Sig Start Date End Date Taking? Authorizing Provider  amLODipine (NORVASC) 10 MG tablet Take 10 mg by mouth daily. 03/20/21  Yes [provider]  atorvastatin (LIPITOR) 40 MG tablet Take 40 mg by mouth every morning. 03/20/21  Yes [provider]  ELIQUIS 5 MG TABS tablet Take 5 mg by mouth 2 (two) times daily. 03/20/21  Yes [provider]  insulin glargine (LANTUS) 100 UNIT/ML injection Inject 18 Units into the skin daily.   Yes [provider]  lisinopril (ZESTRIL) 5 MG tablet Take 5 mg by mouth every morning. 03/02/21  Yes [provider]  oxyCODONE (OXY IR/ROXICODONE) 5 MG immediate release tablet Take 5 mg by mouth every 6 (six) hours as needed for severe pain.   Yes [provider]  blood glucose meter kit and supplies KIT Dispense based on patient and insurance preference. Use up to four times daily as directed. 03/27/22   Sherryll Burger, Pratik D, DO  Insulin Pen Needle (PEN NEEDLES 3/16") 31G X 5 MM MISC Take as directed with insulin pen. 03/27/22   Maurilio Lovely D, DO     Critical care provider statement:   Total critical care time: 33 minutes   Performed by: Karna Christmas MD   Critical care time was exclusive of separately billable procedures and treating other patients.   Critical  care was necessary to treat or  prevent imminent or life-threatening deterioration.   Critical care was time spent personally by me on the following activities: development of treatment plan with patient and/or surrogate as well as nursing, discussions with consultants, evaluation of patient's response to treatment, examination of patient, obtaining history from patient or surrogate, ordering and performing treatments and interventions, ordering and review of laboratory studies, ordering and review of radiographic studies, pulse oximetry and re-evaluation of patient's condition.    Vida Rigger, M.D.  Pulmonary & Critical Care Medicine

## 2022-10-02 NOTE — Progress Notes (Signed)
ANTICOAGULATION CONSULT NOTE   Pharmacy Consult for IV heparin Indication: atrial fibrillation  No Known Allergies  Patient Measurements: Height: 6' (182.9 cm) Weight: 81.9 kg (180 lb 8.9 oz) IBW/kg (Calculated) : 77.6 Heparin Dosing Weight: 73.9 kg  Vital Signs: Temp: 98.8 F (37.1 C) (07/19 1200) Temp Source: Oral (07/19 0315) BP: 97/63 (07/19 1400) Pulse Rate: 90 (07/19 1400)  Labs: Recent Labs     0000 09/29/22 2246 09/30/22 0334 09/30/22 0851 09/30/22 0851 10/01/22 0013 10/01/22 0448 10/01/22 0839 10/02/22 0423 10/02/22 1338  HGB   < >  --  12.4*  --   --   --  12.1*  --  10.3*  --   HCT  --   --  34.5*  --   --   --  33.3*  --  28.7*  --   PLT  --   --  288  --   --   --  273  --  187  --   APTT  --  128*  --  114*  --  128*  --   --   --   --   LABPROT  --   --  23.2*  --   --   --   --   --   --   --   INR  --   --  2.0*  --   --   --   --   --   --   --   HEPARINUNFRC  --   --   --  0.38   < > 0.38  --  0.33 0.25* 0.29*  CREATININE  --   --  3.05*  --   --   --  2.96*  --  2.49*  --    < > = values in this interval not displayed.    Estimated Creatinine Clearance: 28.1 mL/min (A) (by C-G formula based on SCr of 2.49 mg/dL (H)).   Medical History: Past Medical History:  Diagnosis Date   Atrial fibrillation (HCC)    Cancer (HCC)    pancreatic   Diabetes mellitus without complication (HCC)    Hypercholesteremia    Hypertension     Medications:  Eliquis 5 mg BID (last dose 09/24/22)  Assessment: 77 year old male with PMH atrial fibrillation on Eliquis prior to admission. Plan was for cholecystectomy drain placement, but was cancelled due to acute Afib. Last dose was > 2 days ago.   7/16 0201 aPTT 109 and heparin level 0.7.  7/16 0949 aPTT 100, therapeutic x 1 7/16 2246 aPTT 128  7/17 0851 aPTT HL 0.38 - no note written. Heparin was stopped around 0910. Restarted 1543 7/18 0013 aPTT 128 HL 0.38.  7/18 0839 HL 0.33 7/19 0423 HL 0.25  7/19 1338  HL 0.29  Goal of Therapy:  HL 0.3 - 0.7  Monitor platelets by anticoagulation protocol: Yes   Plan:  - Heparin level is subtherapeutic.  - Will give heparin bolus of 1100 units/hr - Increase heparin infusion to 1100 units/hr - Recheck heparin level in 8 hours - CBC daily while on heparin.    Bettey Costa, PharmD Clinical Pharmacist 10/02/2022 2:58 PM

## 2022-10-02 NOTE — Progress Notes (Signed)
Hematology/Oncology Progress note [phone] [phone]     Patient Care Team: Dondra Spry, MD as PCP - General (Internal Medicine) Clinic, General [facility] Medicine)   Name of the patient: Cameron Gutierrez  [id number]  1946-02-22  Date of visit: 10/02/22   INTERVAL HISTORY-   Remains in ICU for atrial fibrillation with rapid ventricular response.  On amiodarone drip, heparin drip, vasopressors. 10/01/2022 S/p GB drain  Had BM overnight. Denies pain.  Wife and another family members are at bedside.   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) 09/26/2022   Permanent atrial fibrillation (HCC) 09/26/2022   Hyperglycemia 03/27/2022   Pseudohyponatremia 03/27/2022   Chronic kidney disease, stage 3b (HCC) 03/27/2022   Essential hypertension 03/27/2022   Mixed hyperlipidemia 03/27/2022   History of atrial fibrillation 03/27/2022     Past Medical History:  Diagnosis Date   Atrial fibrillation (HCC)    Cancer (HCC)    pancreatic   Diabetes mellitus without complication (HCC)    Hypercholesteremia    Hypertension      Past Surgical History:  Procedure Laterality Date   KNEE SURGERY     REPLACEMENT TOTAL KNEE Right    SHOULDER SURGERY      Social History   Socioeconomic History   Marital status: Married    Spouse name: Not on file   Number of children: Not on file   Years of education: Not on file   Highest education level: Not on file  Occupational History   Not on file  Tobacco  Use   Smoking status: Never   Smokeless tobacco: Never  Substance and Sexual Activity   Alcohol use: Not Currently   Drug use: Never   Sexual activity: Not on file  Other Topics Concern   Not on file  Social History Narrative   ** Merged History Encounter **       Social Determinants of Health   Financial Resource Strain: Low Risk  (09/21/2022)   Received from [facility]   Overall Financial Resource Strain (CARDIA)    Difficulty of Paying Living Expenses: Not very hard  Food Insecurity: No Food Insecurity (09/26/2022)   Hunger Vital Sign    Worried About Running Out of Food in the Last Year: Never true    Ran Out of Food in the Last Year: Never true  Transportation Needs: No Transportation Needs (09/26/2022)   PRAPARE - Administrator, Civil Service (Medical): No    Lack of Transportation (Non-Medical): No  Physical Activity: Not on file  Stress: No Stress Concern Present (09/21/2022)   Received from [facility] - Occupational Stress Questionnaire    Feeling of Stress : Not at all  Social Connections: Not on file  Intimate Partner Violence: Not At Risk (09/26/2022)   Humiliation, Afraid, Rape, and Kick questionnaire    Fear of Current or Ex-Partner: No    Emotionally Abused: No    Physically Abused: No    Sexually Abused: No  History reviewed. No pertinent family history.   Current Facility-Administered Medications:    0.9 %  sodium chloride infusion, 250 mL, Intravenous, Continuous, Ezequiel Essex, NP, Held at 09/29/22 1140   albumin human 25 % solution 25 g, 25 g, Intravenous, Daily, Karna Christmas, Fuad, MD, Last Rate: 60 mL/hr at 10/02/22 1100, 25 g at 10/02/22 1100   [COMPLETED] amiodarone (NEXTERONE) 1.8 mg/mL load via infusion 150 mg, 150 mg, Intravenous, Once, 150 mg at 09/28/22 1934 **FOLLOWED BY** [EXPIRED] amiodarone (NEXTERONE PREMIX) 360-4.14 MG/200ML-% (1.8 mg/mL) IV infusion, 60 mg/hr, Intravenous,  Continuous, Stopped at 09/29/22 0151 **FOLLOWED BY** amiodarone (NEXTERONE PREMIX) 360-4.14 MG/200ML-% (1.8 mg/mL) IV infusion, 30 mg/hr, Intravenous, Continuous, Enedina Finner, MD, Last Rate: 16.67 mL/hr at 10/02/22 0615, 30 mg/hr at 10/02/22 0615   atorvastatin (LIPITOR) tablet 40 mg, 40 mg, Oral, q morning, Enedina Finner, MD, 40 mg at 10/02/22 0957   Chlorhexidine Gluconate Cloth 2 % PADS 6 each, 6 each, Topical, QHS, Enedina Finner, MD, 6 each at 10/01/22 2116   docusate sodium (COLACE) capsule 100 mg, 100 mg, Oral, BID, Enedina Finner, MD, 100 mg at 10/02/22 0957   feeding supplement (ENSURE ENLIVE / ENSURE PLUS) liquid 237 mL, 237 mL, Oral, TID BM, Enedina Finner, MD, 237 mL at 10/02/22 1451   fentaNYL (SUBLIMAZE) injection 50 mcg, 50 mcg, Intravenous, Q4H PRN, Enedina Finner, MD, 50 mcg at 10/01/22 1752   heparin ADULT infusion 100 units/mL (25000 units/271mL), 1,100 Units/hr, Intravenous, Continuous, Nazari, Walid A, RPH, Last Rate: 11 mL/hr at 10/02/22 1502, 1,100 Units/hr at 10/02/22 1502   heparin bolus via infusion 1,100 Units, 1,100 Units, Intravenous, Once, Nazari, Walid A, RPH   insulin aspart (novoLOG) injection 0-9 Units, 0-9 Units, Subcutaneous, Q4H, Rust-Chester, Britton L, NP, 1 Units at 10/02/22 1215   iohexol (OMNIPAQUE) 9 MG/ML oral solution 500 mL, 500 mL, Oral, Once PRN, Ezequiel Essex, NP, 500 mL at 09/29/22 1616   levalbuterol (XOPENEX) nebulizer solution 0.63 mg, 0.63 mg, Nebulization, Q6H PRN, Ezequiel Essex, NP   metoprolol tartrate (LOPRESSOR) injection 5 mg, 5 mg, Intravenous, Q4H PRN, Enedina Finner, MD, 5 mg at 09/28/22 1456   midodrine (PROAMATINE) tablet 10 mg, 10 mg, Oral, TID WC, Harlon Ditty D, NP, 10 mg at 10/02/22 1215   multivitamin with minerals tablet 1 tablet, 1 tablet, Oral, Daily, Enedina Finner, MD, 1 tablet at 10/02/22 0958   ondansetron (ZOFRAN) tablet 4 mg, 4 mg, Oral, Q6H PRN **OR** ondansetron (ZOFRAN) injection 4 mg, 4 mg, Intravenous, Q6H PRN, Enedina Finner, MD, 4 mg  at 09/28/22 1638   oxyCODONE (Oxy IR/ROXICODONE) immediate release tablet 10 mg, 10 mg, Oral, Q4H PRN, Enedina Finner, MD, 10 mg at 09/29/22 2215   phenylephrine (NEO-SYNEPHRINE) 20mg /NS premix infusion, 25-200 mcg/min, Intravenous, Titrated, Ezequiel Essex, NP, Stopped at 10/02/22 1200   piperacillin-tazobactam (ZOSYN) IVPB 3.375 g, 3.375 g, Intravenous, Q8H, Rust-Chester, Britton L, NP, Last Rate: 12.5 mL/hr at 10/02/22 1451, 3.375 g at 10/02/22 1451   polyethylene glycol (MIRALAX / GLYCOLAX) packet 17 g, 17 g, Oral, Daily PRN, Enedina Finner, MD   promethazine (PHENERGAN) 12.5 mg in sodium chloride 0.9 % 50 mL IVPB, 12.5 mg, Intravenous, Q6H PRN, Enedina Finner, MD   sodium chloride flush (NS) 0.9 % injection 5 mL, 5 mL, Intracatheter, Q8H, Suttle, Dylan J, MD, 5 mL at 10/02/22 1451   traMADol (ULTRAM) tablet 50 mg, 50 mg, Oral, Q6H PRN, Enedina Finner, MD, 50 mg at 09/27/22 2252   traZODone (DESYREL) tablet 25  mg, 25 mg, Oral, QHS PRN, Enedina Finner, MD, 25 mg at 09/27/22 2253   Physical exam:  Vitals:   10/02/22 1030 10/02/22 1200 10/02/22 1300 10/02/22 1400  BP: 90/62 94/70 91/64  97/63  Pulse: 87 86 79 90  Resp: (!) 21 (!) 22 18 (!) 24  Temp:  98.8 F (37.1 C)    TempSrc:      SpO2: 98% 100% 100% 100%  Weight:      Height:       Physical Exam Constitutional:      General: He is not in acute distress.    Appearance: He is not diaphoretic.  HENT:     Head: Normocephalic and atraumatic.  Eyes:     General: No scleral icterus. Cardiovascular:     Rate and Rhythm: Tachycardia present. Rhythm irregular.  Pulmonary:     Effort: Pulmonary effort is normal. No respiratory distress.  Abdominal:     General: There is no distension.  Musculoskeletal:        General: Normal range of motion.  Skin:    General: Skin is warm.     Findings: No erythema.  Neurological:     Mental Status: He is alert and oriented to person, place, and time. Mental status is at baseline.     Cranial Nerves: No  cranial nerve deficit.     Motor: No abnormal muscle tone.  Psychiatric:        Mood and Affect: Mood and affect normal.       Labs    Latest Ref Rng & Units 10/02/2022    4:23 AM 10/01/2022    4:48 AM 09/30/2022    3:34 AM  CBC  WBC 4.0 - 10.5 K/uL 9.0  14.1  17.7   Hemoglobin 13.0 - 17.0 g/dL 46.9  62.9  52.8   Hematocrit 39.0 - 52.0 % 28.7  33.3  34.5   Platelets 150 - 400 K/uL 187  273  288       Latest Ref Rng & Units 10/02/2022   10:44 AM 10/02/2022    4:23 AM 10/01/2022    8:00 PM  CMP  Glucose 70 - 99 mg/dL  413    BUN 8 - 23 mg/dL  76    Creatinine 2.44 - 1.24 mg/dL  0.10    Sodium 272 - 536 mmol/L  133    Potassium 3.5 - 5.1 mmol/L  3.0  3.3   Chloride 98 - 111 mmol/L  100    CO2 22 - 32 mmol/L  22    Calcium 8.9 - 10.3 mg/dL  6.9    Total Protein 6.5 - 8.1 g/dL 4.7     Total Bilirubin 0.3 - 1.2 mg/dL 2.5     Alkaline Phos 38 - 126 U/L 685     AST 15 - 41 U/L 844     ALT 0 - 44 U/L 310        RADIOGRAPHIC STUDIES: I have personally reviewed the radiological images as listed and agreed with the findings in the report. DG Abd 1 View  Result Date: 09/30/2022 CLINICAL DATA:  NG tube placement EXAM: ABDOMEN - 1 VIEW COMPARISON:  CT abdomen/pelvis 1 day prior FINDINGS: The enteric catheter tip and sidehole are in the stomach. The stomach remains significantly distended. Air in the gallbladder lumen and wall is noted consistent with the findings of emphysematous cholecystitis seen on CT from 1 day prior. The free intraperitoneal air seen on that study is not definitely  seen on this study. There is gaseous distention of the bowel in the imaged abdomen. A biliary stent is noted. IMPRESSION: 1. Enteric catheter tip and sidehole in the stomach which remains distended. 2. Air in the gallbladder lumen and wall consistent with the findings of emphysematous cholecystitis on the CT study from 1 day prior. The pneumoperitoneum seen on that study is not appreciated on the current  study. Electronically Signed   By: Lesia Hausen M.D.   On: 09/30/2022 13:28   CT PERC CHOLECYSTOSTOMY  Result Date: 09/30/2022 INDICATION: 77 year old male with history recently diagnosed advanced stage pancreatic cancer with biliary obstruction status post covered common bile duct stent placement at outside facility approximally 1 week ago, now with emphysematous/ruptured acute cholecystitis with associated perihepatic fluid collection. EXAM: CT PERC DRAIN PERITONEAL ABCESS; CT PERCUTANEOUS CHOLECYSTOSTOMY COMPARISON:  None Available. MEDICATIONS: The patient is currently admitted to the hospital and receiving intravenous antibiotics. The antibiotics were administered within an appropriate time frame prior to the initiation of the procedure. ANESTHESIA/SEDATION: Moderate (conscious) sedation was employed during this procedure. A total of Versed 0.5 mg and Fentanyl 25 mcg was administered intravenously. Moderate Sedation Time: 16 minutes. The patient's level of consciousness and vital signs were monitored continuously by radiology nursing throughout the procedure under my direct supervision. CONTRAST:  None COMPLICATIONS: None immediate. PROCEDURE: RADIATION DOSE REDUCTION: This exam was performed according to the departmental dose-optimization program which includes automated exposure control, adjustment of the mA and/or kV according to patient size and/or use of iterative reconstruction technique. Informed written consent was obtained from the patient after a discussion of the risks, benefits and alternatives to treatment. The patient was placed supine on the CT gantry and a pre procedural CT was performed re-demonstrating the known abscess/fluid collection within the gallbladder and perihepatic region. The procedure was planned. A timeout was performed prior to the initiation of the procedure. The right upper quadrant was prepped and draped in the usual sterile fashion. The overlying soft tissues were  anesthetized with 1% lidocaine with epinephrine. Appropriate trajectories were planned with the use of a 22 gauge spinal needle. Two separate 18 gauge trocar needles were advanced into the gallbladder as well as into the perihepatic fluid collection and short Amplatz super stiff wires were coiled within the gallbladder and perihepatic fluid collection. Appropriate positioning was confirmed with a limited CT scan. The tracks were serially dilated allowing placement of 2, 10 French all-purpose drainage catheters. Appropriate positioning was confirmed with a limited postprocedural CT scan. Approximately 100 ml of bilious and purulent fluid was aspirated from the gallbladder in a proximally 500 mL of similar appearing, bilious and purulent fluid was aspirated from the perihepatic fluid collection. The drains were connected to a bulb suction and sutured in place. Dressings were placed. The patient tolerated the procedure well without immediate post procedural complication. IMPRESSION: 1. Successful CT guided placement of a 10 French percutaneous cholecystostomy drain with aspiration of 100 mL of bilious and purulent fluid. Samples were sent to the laboratory as requested by the ordering clinical team. 2. Successful CT guided placement of a 10 French right upper quadrant perihepatic fluid collection drain with aspiration of 500 mL of bilious and purulent fluid. Marliss Coots, MD [facility] Radiology Electronically Signed   By: Marliss Coots M.D.   On: 09/30/2022 13:19   CT GUIDED PERITONEAL/RETROPERITONEAL FLUID DRAIN BY PERC CATH  Result Date: 09/30/2022 INDICATION: 77 year old male with history recently diagnosed advanced stage pancreatic cancer with biliary  obstruction status post covered common bile duct stent placement at outside facility approximally 1 week ago, now with emphysematous/ruptured acute cholecystitis with associated perihepatic fluid collection. EXAM:  CT PERC DRAIN PERITONEAL ABCESS; CT PERCUTANEOUS CHOLECYSTOSTOMY COMPARISON:  None Available. MEDICATIONS: The patient is currently admitted to the hospital and receiving intravenous antibiotics. The antibiotics were administered within an appropriate time frame prior to the initiation of the procedure. ANESTHESIA/SEDATION: Moderate (conscious) sedation was employed during this procedure. A total of Versed 0.5 mg and Fentanyl 25 mcg was administered intravenously. Moderate Sedation Time: 16 minutes. The patient's level of consciousness and vital signs were monitored continuously by radiology nursing throughout the procedure under my direct supervision. CONTRAST:  None COMPLICATIONS: None immediate. PROCEDURE: RADIATION DOSE REDUCTION: This exam was performed according to the departmental dose-optimization program which includes automated exposure control, adjustment of the mA and/or kV according to patient size and/or use of iterative reconstruction technique. Informed written consent was obtained from the patient after a discussion of the risks, benefits and alternatives to treatment. The patient was placed supine on the CT gantry and a pre procedural CT was performed re-demonstrating the known abscess/fluid collection within the gallbladder and perihepatic region. The procedure was planned. A timeout was performed prior to the initiation of the procedure. The right upper quadrant was prepped and draped in the usual sterile fashion. The overlying soft tissues were anesthetized with 1% lidocaine with epinephrine. Appropriate trajectories were planned with the use of a 22 gauge spinal needle. Two separate 18 gauge trocar needles were advanced into the gallbladder as well as into the perihepatic fluid collection and short Amplatz super stiff wires were coiled within the gallbladder and perihepatic fluid collection. Appropriate positioning was confirmed with a limited CT scan. The tracks were serially dilated allowing  placement of 2, 10 French all-purpose drainage catheters. Appropriate positioning was confirmed with a limited postprocedural CT scan. Approximately 100 ml of bilious and purulent fluid was aspirated from the gallbladder in a proximally 500 mL of similar appearing, bilious and purulent fluid was aspirated from the perihepatic fluid collection. The drains were connected to a bulb suction and sutured in place. Dressings were placed. The patient tolerated the procedure well without immediate post procedural complication. IMPRESSION: 1. Successful CT guided placement of a 10 French percutaneous cholecystostomy drain with aspiration of 100 mL of bilious and purulent fluid. Samples were sent to the laboratory as requested by the ordering clinical team. 2. Successful CT guided placement of a 10 French right upper quadrant perihepatic fluid collection drain with aspiration of 500 mL of bilious and purulent fluid. Marliss Coots, MD [facility] Radiology Electronically Signed   By: Marliss Coots M.D.   On: 09/30/2022 13:19   CT ABDOMEN PELVIS WO CONTRAST  Addendum Date: 09/29/2022   ADDENDUM REPORT: 09/29/2022 23:18 ADDENDUM: These results were called by telephone at the time of interpretation on 09/29/2022 at 11:14 pm to provider Dr. Everlene Farrier, who verbally acknowledged these results. Electronically Signed   By: Tish Frederickson M.D.   On: 09/29/2022 23:18   Result Date: 09/29/2022 CLINICAL DATA:  Bowel obstruction suspected EXAM: CT ABDOMEN AND PELVIS WITHOUT CONTRAST TECHNIQUE: Multidetector CT imaging of the abdomen and pelvis was performed following the standard protocol without IV contrast. RADIATION DOSE REDUCTION: This exam was performed according to the departmental dose-optimization program which includes automated exposure control, adjustment of the mA and/or kV according to patient size and/or use of iterative reconstruction technique. COMPARISON:  CT abdomen pelvis  09/26/2022 FINDINGS: Lower chest: Partially visualized bilateral small pleural effusions with bilateral lower lobe passive atelectasis. Hepatobiliary: Redemonstration of a couple of hepatic lesions consistent with known metastases. Air-fluid level within the gastric lumen. Air noted within the wall of the gallbladder. Common bile duct stent in appropriate position with associated pneumobilia. Pancreas: Proximal pancreatic mass again noted in difficult to delineate on this study. Finding measures approximately 12.6 x 4.7 cm extending along the liver and duodenum. No surrounding inflammatory changes. No main pancreatic ductal dilatation. Spleen: Normal in size without focal abnormality. Adrenals/Urinary Tract: No adrenal nodule bilaterally. No nephrolithiasis and no hydronephrosis. Several fluid density lesions likely represent simple renal cysts. Simple renal cysts, in the absence of clinically indicated signs/symptoms, require no independent follow-up. No ureterolithiasis or hydroureter. The urinary bladder is decompressed with Foley catheter tip and balloon terminating within the lumen. Stomach/Bowel: Stomach distended with air-fluid level. Bowel thickening of the first portion of the duodenum suggestive of possible metastatic invasion. No evidence of large bowel wall thickening or dilatation. Colonic diverticulosis. Appendix appears normal. Vascular/Lymphatic: No abdominal aorta or iliac aneurysm. Moderate atherosclerotic plaque of the aorta and its branches. No abdominal, pelvic, or inguinal lymphadenopathy. Reproductive: Prostate is unremarkable. Other: Interval increase in trace to small volume simple free fluid within the right upper quadrant. Associated free gas. No intraperitoneal free gas. No organized fluid collection. Musculoskeletal: Bilateral inguinal hernias containing fat and fluid. Subcutaneus soft tissue edema. No suspicious lytic or blastic osseous lesions. No acute displaced fracture. Multilevel  degenerative changes of the spine. IMPRESSION: 1. Acute emphysematous cholecystitis. Recommend emergent surgical consultation. 2. Interval development of pneumoperitoneum. 3. Interval increase in small volume perihepatic ascites. 4. Gastric lumen distended with air-fluid level. Developing obstruction along the first portion of duodenum in the region of the pancreatic malignancy not excluded. 5. Metastatic pancreatic malignancy poorly evaluated on this noncontrast study. Please see CT abdomen pelvis 09/26/2022 for further details. 6. Partially visualized bilateral small pleural effusions with bilateral lower lobe passive atelectasis. 7.  Aortic Atherosclerosis (ICD10-I70.0). These results were called by telephone at the time of interpretation on 09/29/2022 at 10:37 pm to provider Dr. Karna Christmas, who verbally acknowledged these results. Electronically Signed: By: Tish Frederickson M.D. On: 09/29/2022 22:44   US THORACENTESIS ASP PLEURAL SPACE W/IMG GUIDE  Result Date: 09/29/2022 INDICATION: Right pleural effusion.  Pancreatic cancer. EXAM: ULTRASOUND GUIDED RIGHT THORACENTESIS MEDICATIONS: None. COMPLICATIONS: None immediate. PROCEDURE: An ultrasound guided thoracentesis was thoroughly discussed with the patient and questions answered. The benefits, risks, alternatives and complications were also discussed. The patient understands and wishes to proceed with the procedure. Written consent was obtained. Ultrasound was performed to localize and mark an adequate pocket of fluid in the right chest. The area was then prepped and draped in the normal sterile fashion. 1% Lidocaine was used for local anesthesia. Under ultrasound guidance a 6 Fr Safe-T-Centesis catheter was introduced. Thoracentesis was performed. The catheter was removed and a dressing applied. FINDINGS: A total of approximately 800 mL of dark yellow fluid was removed. Samples were sent to the laboratory as requested by the clinical team. Images were also  obtained of the right upper abdomen to evaluate the gallbladder. Loculated perihepatic ascites is identified. Previously identified gallbladder is poorly characterized because there appears to be air within the gallbladder. The amount of fluid around the liver has significantly increased since the right upper quadrant ultrasound 06/27/2022. IMPRESSION: 1. Successful ultrasound guided right thoracentesis yielding 800 mL of pleural fluid. 2. Images of the right upper  quadrant demonstrate markedly increased perihepatic ascites. Ascites appears to be complex. In addition, the gallbladder now appears to contain a large amount of gas which is likely related to the biliary stent and pneumobilia. Electronically Signed   By: Richarda Overlie M.D.   On: 09/29/2022 15:52   DG Chest Port 1 View  Result Date: 09/29/2022 CLINICAL DATA:  Pleural effusion.  Status post thoracentesis. EXAM: PORTABLE CHEST 1 VIEW COMPARISON:  Chest radiographs 09/28/2022 and 09/26/2022. Abdominal CT 09/26/2022. FINDINGS: 1417 hours. Interval decreased volume of right pleural effusion. A left pleural effusion and left basilar airspace disease appear unchanged. No pneumothorax. The heart size and mediastinal contours are stable. Moderate gaseous distension of the stomach noted. Curvilinear lucency in the right upper quadrant corresponds with air in the gallbladder lumen on recent CT. Patient has a biliary stent. IMPRESSION: 1. Decreased right pleural effusion following thoracentesis. No pneumothorax. 2. No significant change in left pleural effusion and left basilar airspace disease. Electronically Signed   By: Carey Bullocks M.D.   On: 09/29/2022 15:46   ECHOCARDIOGRAM COMPLETE  Result Date: 09/29/2022    ECHOCARDIOGRAM REPORT   Patient Name:   Cameron Gutierrez Date of Exam: 09/29/2022 Medical Rec #:  [id number]       Height:       72.0 in Accession #:    [id number]      Weight:       163.0 lb Date of Birth:  1945/08/15       BSA:          1.953 m  Patient Age:    76 years        BP:           112/74 mmHg Patient Gender: M               HR:           115 bpm. Exam Location:  [facility] Procedure: 2D Echo, Cardiac Doppler and Color Doppler Indications:     Atrial Fibrillation I48.91  History:         Patient has no prior history of Echocardiogram examinations.                  Arrythmias:Atrial Fibrillation; Risk Factors:Diabetes and                  Hypertension.  Sonographer:     Cristela Blue Referring Phys:  [id number] SONA PATEL Diagnosing Phys: Yvonne Kendall MD IMPRESSIONS  1. Left ventricular ejection fraction, by estimation, is 60 to 65%. The left ventricle has normal function. The left ventricle has no regional wall motion abnormalities. There is mild asymmetric left ventricular hypertrophy of the basal-septal segment. Left ventricular diastolic function could not be evaluated.  2. Pulmonary artery pressure is mildly elevated (RVSP 30-35 mmHg plus central venous/right atrial pressure). Right ventricular systolic function is normal. The right ventricular size is normal.  3. Moderate pleural effusion in the right lateral region.  4. The mitral valve is normal in structure. Mild to moderate mitral valve regurgitation.  5. Tricuspid valve regurgitation is mild to moderate.  6. The aortic valve is tricuspid. Aortic valve regurgitation is mild. No aortic stenosis is present. FINDINGS  Left Ventricle: Left ventricular ejection fraction, by estimation, is 60 to 65%. The left ventricle has normal function. The left ventricle has no regional wall motion abnormalities. The left ventricular internal cavity size was normal in size. There is  mild asymmetric left ventricular hypertrophy of the basal-septal  segment. Left ventricular diastolic function could not be evaluated due to atrial fibrillation. Left ventricular diastolic function could not be evaluated. Right Ventricle: Pulmonary artery pressure is mildly elevated (RVSP 30-35 mmHg plus central venous/right atrial  pressure). The right ventricular size is normal. No increase in right ventricular wall thickness. Right ventricular systolic function is normal. Left Atrium: Left atrial size was normal in size. Right Atrium: Right atrial size was normal in size. Pericardium: The pericardium was not well visualized. Mitral Valve: The mitral valve is normal in structure. Mild to moderate mitral valve regurgitation. Tricuspid Valve: The tricuspid valve is normal in structure. Tricuspid valve regurgitation is mild to moderate. Aortic Valve: The aortic valve is tricuspid. Aortic valve regurgitation is mild. No aortic stenosis is present. Aortic valve mean gradient measures 3.0 mmHg. Aortic valve peak gradient measures 4.7 mmHg. Aortic valve area, by VTI measures 3.27 cm. Pulmonic Valve: The pulmonic valve was grossly normal. Pulmonic valve regurgitation is trivial. No evidence of pulmonic stenosis. Aorta: The aortic root is normal in size and structure. Pulmonary Artery: The pulmonary artery is not well seen. Venous: The inferior vena cava was not well visualized. IAS/Shunts: The interatrial septum was not well visualized. Additional Comments: There is a moderate pleural effusion in the right lateral region.  LEFT VENTRICLE PLAX 2D LVIDd:         3.90 cm LVIDs:         2.60 cm LV PW:         0.90 cm LV IVS:        1.20 cm LVOT diam:     2.20 cm LV SV:         47 LV SV Index:   24 LVOT Area:     3.80 cm  RIGHT VENTRICLE RV Basal diam:  3.50 cm RV Mid diam:    2.80 cm LEFT ATRIUM             Index        RIGHT ATRIUM           Index LA diam:        4.30 cm 2.20 cm/m   RA Area:     16.20 cm LA Vol (A2C):   70.1 ml 35.89 ml/m  RA Volume:   34.00 ml  17.41 ml/m LA Vol (A4C):   47.7 ml 24.42 ml/m LA Biplane Vol: 60.7 ml 31.08 ml/m  AORTIC VALVE AV Area (Vmax):    2.81 cm AV Area (Vmean):   2.73 cm AV Area (VTI):     3.27 cm AV Vmax:           108.00 cm/s AV Vmean:          74.350 cm/s AV VTI:            0.143 m AV Peak Grad:       4.7 mmHg AV Mean Grad:      3.0 mmHg LVOT Vmax:         79.80 cm/s LVOT Vmean:        53.400 cm/s LVOT VTI:          0.123 m LVOT/AV VTI ratio: 0.86  AORTA Ao Root diam: 3.66 cm MITRAL VALVE               TRICUSPID VALVE MV Area (PHT): 3.63 cm    TR Peak grad:   32.9 mmHg MV Decel Time: 209 msec    TR Vmax:        287.00  cm/s MV E velocity: 86.50 cm/s                            SHUNTS                            Systemic VTI:  0.12 m                            Systemic Diam: 2.20 cm Yvonne Kendall MD Electronically signed by Yvonne Kendall MD Signature Date/Time: 09/29/2022/2:31:41 PM    Final    US RENAL  Result Date: 09/29/2022 CLINICAL DATA:  Acute renal failure. EXAM: RENAL / URINARY TRACT ULTRASOUND COMPLETE COMPARISON:  September 26, 2022. FINDINGS: Right Kidney: Renal measurements: 9.6 x 5.1 x 4.7 cm = volume: 122 mL. At least 2 parapelvic cysts are noted, the largest measuring 2.3 cm. Increased echogenicity of renal parenchyma is noted. No mass or hydronephrosis visualized. Left Kidney: Renal measurements: 10.1 x 5.9 x 5.5 cm = volume: 170 mL. Multi septated cyst measuring 2.3 x 2.3 x 1.9 cm is noted. 2.5 cm simple cyst is noted. Increased echogenicity of renal parenchyma is noted. No hydronephrosis visualized. Bladder: Appears normal for degree of bladder distention. Other: None. IMPRESSION: 2.3 cm multi-septated complex cyst seen in left kidney. Follow-up ultrasound in 6-12 months is recommended to ensure stability. Increased echogenicity of renal parenchyma is noted bilaterally suggesting medical renal disease. No hydronephrosis or renal obstruction is noted. Electronically Signed   By: Lupita Raider M.D.   On: 09/29/2022 13:28   DG Chest Port 1 View  Result Date: 09/28/2022 CLINICAL DATA:  Shortness of breath. EXAM: PORTABLE CHEST 1 VIEW COMPARISON:  September 26, 2022 FINDINGS: Calcific atherosclerotic disease of the aorta. Cardiomediastinal silhouette is enlarged. Mediastinal contours appear intact.  Bilateral pleural effusions, right greater than left. No focal airspace consolidation. Osseous structures are without acute abnormality. Soft tissues are grossly normal. IMPRESSION: 1. Bilateral pleural effusions, right greater than left. 2. Enlarged cardiomediastinal silhouette. Electronically Signed   By: Ted Mcalpine M.D.   On: 09/28/2022 15:37   US Abdomen Limited RUQ (LIVER/GB)  Result Date: 09/26/2022 CLINICAL DATA:  Abdominal pain. History of metastatic pancreatic cancer. EXAM: ULTRASOUND ABDOMEN LIMITED RIGHT UPPER QUADRANT COMPARISON:  CT abdomen pelvis from same day. FINDINGS: Gallbladder: No gallstones. Sludge with mild gallbladder wall thickening and small volume pericholecystic fluid. Positive sonographic Murphy sign noted by sonographer. Common bile duct: Diameter: 4 mm, normal.  Common bile duct stent noted. Liver: Liver lesions seen on CT are not well identified by ultrasound. Within normal limits in parenchymal echogenicity. Portal vein is patent on color Doppler imaging with normal direction of blood flow towards the liver. Other: None. IMPRESSION: 1. Gallbladder sludge with mild gallbladder wall thickening and small volume pericholecystic fluid. Positive sonographic Murphy sign. Findings are concerning for acute acalculous cholecystitis. 2. Liver metastases seen on CT are not well identified by ultrasound. Electronically Signed   By: Obie Dredge M.D.   On: 09/26/2022 14:56   CT ABDOMEN PELVIS W CONTRAST  Result Date: 09/26/2022 CLINICAL DATA:  Central chest pain. Abdominal pain. History of pancreatic cancer. EXAM: CT ABDOMEN AND PELVIS WITH CONTRAST TECHNIQUE: Multidetector CT imaging of the abdomen and pelvis was performed using the standard protocol following bolus administration of intravenous contrast. RADIATION DOSE REDUCTION: This exam was performed according to the departmental dose-optimization  program which includes automated exposure control, adjustment of the mA  and/or kV according to patient size and/or use of iterative reconstruction technique. CONTRAST:  80mL OMNIPAQUE IOHEXOL 300 MG/ML  SOLN COMPARISON:  Chest radiograph 09/26/2022. Outside CT[facility] from [facility] dated 09/21/2022 and 09/22/2022. Outside images are unavailable but reports are available. FINDINGS: Lower chest: Multiple small pulmonary nodules in the visualized lungs. Index pulmonary nodule is in the right lower lobe along the right major fissure on image 33/4 measuring up to 1.3 cm. There also may be a larger nodule in the left lower lobe measuring 1.4 cm image 15/4. Volume loss and compressive atelectasis in the left lower lobe. Compressive atelectasis in the right lower lobe. Bilateral small pleural effusions. Hepatobiliary: Multiple ill-defined hypoechoic liver lesions compatible with metastatic disease. Index lesion in the left hepatic lobe measuring 2.1 cm on image 23/2. There is gas and contrast within the gallbladder. The gallbladder is moderately distended and findings compatible with recent ERCP. Metallic biliary stent in the common bile duct. Narrowing in the biliary stent associated with the pancreatic mass. Small amount of pneumobilia. Small amount of fluid and stranding around the liver and gallbladder. Pancreas: Large poorly defined pancreatic head mass with multiple hypoechoic components. Difficult to accurately measure the mass itself but it roughly measures 4.0 x 4.9 x 5.1 cm. Dilatation of the main pancreatic duct. The mass extends cephalad and appears to be involving the caudate lobe. There is a caudate lesion measuring up to 4.1 cm. In addition, there is hypoechoic implant or fluid collection cephalad to the caudate lobe involving the hepatic subcapsular space. This subcapsular collection or implant measures 7.8 x 4.6 x 5.6 cm. This was present on the previous exam based on the prior report. This caudate/subcapsular component causing mass effect on the adjacent IVC. Mild stranding and edema  throughout the porta hepatis region. Spleen: Small splenic calcifications.  Spleen is normal for size. Adrenals/Urinary Tract: Normal adrenal glands. Bilateral cortical and parapelvic renal cysts. No suspicious renal lesions. No hydronephrosis. Bladder is decompressed. There is probably a small bladder diverticulum along the posterior aspect of the bladder. Cannot exclude mild bladder wall thickening. Asymmetric left perinephric stranding. Stomach/Bowel: Normal appearance of the stomach. No bowel dilatation. Colonic diverticula involving the sigmoid colon. No evidence for acute bowel inflammation. Vascular/Lymphatic: Atherosclerotic disease involving the abdominal aorta without aneurysm. Atherosclerotic calcifications at the origin of the SMA. Atherosclerotic calcifications at the origin of the bilateral renal arteries. IVC is patent but there is compression in the suprarenal IVC due to the subcapsular fluid collection / mass. 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 Main portal vein is small but patent. Intrahepatic portal veins are patent. 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 Splenic vein is patent. Pathologic lymphadenopathy in the retrocaval space measuring up to 1.7 cm on image 39/2. Reproductive: Prostate contains calcifications, asymmetric towards the left. Other: Small amount of free fluid in the abdomen and pelvis. Musculoskeletal: Evidence for an old right tenth rib fracture. Bilateral inguinal hernias containing fat. Small amount of fluid in the left inguinal hernia. No suspicious osseous lesion. IMPRESSION: 1. Large pancreatic head mass compatible with the patient's history of pancreatic cancer. The mass extends cephalad and involves the caudate lobe. There is also a subcapsular fluid collection or implant cephalad to the caudate lobe causing mass effect on the adjacent IVC. 2. Multiple  liver lesions, abdominal lymphadenopathy and  multiple pulmonary nodules. Findings compatible with metastatic disease. 3. Metallic biliary stent in the common bile duct and there is a small amount of pneumobilia. No significant intrahepatic biliary dilatation. In addition, there is moderate distention of the gallbladder containing iodinated contrast and small amount of gas. Findings compatible with recent ERCP. Gallbladder distension could be a source of abdominal pain. 4. Bilateral small pleural effusions with compressive atelectasis in the lower lobes. 5. Small amount of free fluid in the abdomen and pelvis. 6. Colonic diverticulosis without evidence for acute diverticulitis. 7. Aortic Atherosclerosis (ICD10-I70.0). 8. 9 mm splenic artery aneurysm. Electronically Signed   By: Richarda Overlie M.D.   On: 09/26/2022 09:50   DG Chest 2 View  Result Date: 09/26/2022 CLINICAL DATA:  77 year old male with history of central chest pain. EXAM: CHEST - 2 VIEW COMPARISON:  Chest x-ray 07/28/2014. FINDINGS: Lung volumes are low. No consolidative airspace disease. Trace bilateral pleural effusions with bibasilar opacities that are favored to reflect subsegmental atelectasis. No pneumothorax. No evidence of pulmonary edema. No definite suspicious appearing pulmonary nodules or masses are noted. Heart size is normal. Upper mediastinal contours are within normal limits. Atherosclerotic calcifications in the thoracic aorta. IMPRESSION: 1. Low lung volumes with trace bilateral pleural effusions and bibasilar opacities favored to reflect areas of subsegmental atelectasis. 2. Aortic atherosclerosis. Electronically Signed   By: Trudie Reed M.D.   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 Prognosis is poor. He is interested in future treatments, chemotherapy outpatient after his acute issues resolves.   # Acute acalculous cholecystitis S/p  cholecystostomy drain placement, leukocytosis improves  Continue IV antibiotics and pain regimen.   # Atrial fibrillation with RVR. Continue anticoagulation with heparin drip Rate is better controled.  BP is low, hold BP meds Amiodarone gtt.  Cardiology on board   # AKI, possibly secondary to recent IV contrast use.  Nephrology was consulted. Cr is improving.   # microcytic anemia. Check retic panel.   Thank you for allowing me to participate in the care of this patient.   Rickard Patience, MD, PhD Hematology Oncology 10/02/2022

## 2022-10-02 NOTE — Progress Notes (Addendum)
Good day. More alert. Midodrine increased to 10 mg tid. Weaned off of Neo Synephrine  drip slowly. Had 3 medium BM's today. Family with patient  Given Fentanyl x 1 for abdominal pain more alert and talkative. .Progressed to full liquid diet. Ate very little but happy.

## 2022-10-03 DIAGNOSIS — K810 Acute cholecystitis: Secondary | ICD-10-CM | POA: Diagnosis not present

## 2022-10-03 DIAGNOSIS — Z515 Encounter for palliative care: Secondary | ICD-10-CM | POA: Diagnosis not present

## 2022-10-03 DIAGNOSIS — C259 Malignant neoplasm of pancreas, unspecified: Secondary | ICD-10-CM | POA: Diagnosis not present

## 2022-10-03 DIAGNOSIS — I4891 Unspecified atrial fibrillation: Secondary | ICD-10-CM | POA: Diagnosis not present

## 2022-10-03 LAB — AEROBIC/ANAEROBIC CULTURE W GRAM STAIN (SURGICAL/DEEP WOUND)

## 2022-10-03 LAB — HEPARIN LEVEL (UNFRACTIONATED): Heparin Unfractionated: 0.32 IU/mL (ref 0.30–0.70)

## 2022-10-03 LAB — GLUCOSE, CAPILLARY
Glucose-Capillary: 129 mg/dL — ABNORMAL HIGH (ref 70–99)
Glucose-Capillary: 134 mg/dL — ABNORMAL HIGH (ref 70–99)
Glucose-Capillary: 140 mg/dL — ABNORMAL HIGH (ref 70–99)
Glucose-Capillary: 150 mg/dL — ABNORMAL HIGH (ref 70–99)
Glucose-Capillary: 150 mg/dL — ABNORMAL HIGH (ref 70–99)
Glucose-Capillary: 160 mg/dL — ABNORMAL HIGH (ref 70–99)

## 2022-10-03 LAB — CBC
HCT: 31.8 % — ABNORMAL LOW (ref 39.0–52.0)
Hemoglobin: 11.4 g/dL — ABNORMAL LOW (ref 13.0–17.0)
MCH: 27.1 pg (ref 26.0–34.0)
MCHC: 35.8 g/dL (ref 30.0–36.0)
MCV: 75.7 fL — ABNORMAL LOW (ref 80.0–100.0)
Platelets: 206 10*3/uL (ref 150–400)
RBC: 4.2 MIL/uL — ABNORMAL LOW (ref 4.22–5.81)
RDW: 13.4 % (ref 11.5–15.5)
WBC: 11.8 10*3/uL — ABNORMAL HIGH (ref 4.0–10.5)
nRBC: 0 % (ref 0.0–0.2)

## 2022-10-03 LAB — MAGNESIUM: Magnesium: 2.5 mg/dL — ABNORMAL HIGH (ref 1.7–2.4)

## 2022-10-03 LAB — RENAL FUNCTION PANEL
Albumin: 1.9 g/dL — ABNORMAL LOW (ref 3.5–5.0)
Anion gap: 11 (ref 5–15)
BUN: 65 mg/dL — ABNORMAL HIGH (ref 8–23)
CO2: 20 mmol/L — ABNORMAL LOW (ref 22–32)
Calcium: 7.2 mg/dL — ABNORMAL LOW (ref 8.9–10.3)
Chloride: 101 mmol/L (ref 98–111)
Creatinine, Ser: 2.16 mg/dL — ABNORMAL HIGH (ref 0.61–1.24)
GFR, Estimated: 31 mL/min — ABNORMAL LOW (ref >60)
Glucose, Bld: 144 mg/dL — ABNORMAL HIGH (ref 70–99)
Phosphorus: 3.7 mg/dL (ref 2.5–4.6)
Potassium: 2.9 mmol/L — ABNORMAL LOW (ref 3.5–5.1)
Sodium: 132 mmol/L — ABNORMAL LOW (ref 135–145)

## 2022-10-03 MED ORDER — POTASSIUM CHLORIDE 10 MEQ/100ML IV SOLN
10.0000 meq | INTRAVENOUS | Status: AC
  Administered 2022-10-03 (×6): 10 meq via INTRAVENOUS
  Filled 2022-10-03 (×6): qty 100

## 2022-10-03 NOTE — Progress Notes (Signed)
Progress Note   Patient: Cameron Gutierrez QMV:[id number] DOB: 1945/12/23 DOA: 09/26/2022     6 DOS: the patient was seen and examined on 10/03/2022   Brief hospital course:   77 yo male who presented to [facility] ER on 07/13 with acute onset of severe abdominal/chest pain.  He was previously admitted at [facility] from 09/21/22-09/25/22 for abdominal pain along with unintentional weight loss.  During hospitalization at [facility] workup concerning for primary metastatic pancreatic adenocarcinoma.  He underwent ERCP/US with biopsy and stent placed on 09/23/22.  His pain was controlled post procedure, and pt discharged home on 09/25/22 (prescribed oxycodone and eliquis resumed).   ED Course Upon arrival to [facility] ER significant lab results were: Na+ 133/K+ 3.4/glucose 148/calcium 7.4/albumin 2.2/wbc 15.7.  CT Abd Pelvis concerning for moderate distension of the gallbladder but no findings of acute cholecystitis, however pt continued to c/o persistent abdominal pain.  EDP consulted with [facility] Oncologist Dr. Everardo All, and following review of imaging did not feel pt required transfer to [facility].  Pt subsequently admitted to [facility] medsurg unit per hospitalist team for additional workup and treatment due to continued abdominal pain.   CT Abd/Pelvis W Contrast:  Large pancreatic head mass compatible with the patient's history of pancreatic cancer. The mass extends cephalad and involves the caudate lobe. There is also a subcapsular fluid collection or implant cephalad to the caudate lobe causing mass effect on the adjacent IVC. Multiple liver lesions, abdominal lymphadenopathy and multiple pulmonary nodules. Findings compatible with metastatic disease. Metallic biliary stent in the common bile duct and there is a small amount of pneumobilia. No significant intrahepatic biliary dilatation. In addition, there is moderate distention of the gallbladder containing iodinated contrast and small amount of gas. Findings compatible with recent  ERCP. Gallbladder distension could be a source of abdominal pain. Bilateral small pleural effusions with compressive atelectasis in the lower lobes. Small amount of free fluid in the abdomen and pelvis. Colonic diverticulosis without evidence for acute diverticulitis. Aortic Atherosclerosis (ICD10-I70.0). 9 mm splenic artery aneurysm.   CXR:  Low lung volumes with trace bilateral pleural effusions and bibasilar opacities favored to reflect areas of subsegmental atelectasis. Aortic atherosclerosis.  Significant Hospital events :   07/13: Pt admitted to the [facility] unit with diffuse abdominal pain following recent ERCP with stent placement and newly diagnosed primary pancreatic adenocarcinoma with metastasis  07/13: [place] Abd Limited RUQ revealed Gallbladder sludge with mild gallbladder    wall thickening and small volume pericholecystic fluid. Positive sonographic Murphy sign. Findings are concerning for acute acalculous cholecystitis. Liver metastases seen on CT are not well identified by ultrasound.  07/14: General surgery consulted for acute acalculous cholecystitis and recommended decompression of the gallbladder via percutaneous cholecystostomy per IR and continue abx therapy.   07/15: Pt arrived to special recovery for cholecystostomy drain placement and pt noted to be in atrial fibrillation with rvr heart rate 140 to 170's.  Cardiology notified pt received 5 mg of iv cardizem x1 dose/250 ml NS bolus/20 mg of iv lasix.  Procedure canceled and pt transferred to the stepdown unit for cardizem gtt.  PCCM team consulted to assist with management.    09/29/22 patient in no distress, met with family today.  Awaiting percutaneous cholecystostomy and thoracentesis.  Testing for TLS today.  09/30/22 patient with emphysematous cholecystitis and pneumoperitoneum.  S/p throacentesis with partial clinical improvement.  Plan for IR consultation and cholecystostomy tube.    10/01/22 patient remains weak and  chronically ill appearing.  He  is s/p GB drain and plan today to remove NGT and start PO nourishment.  He is being seen by palliative cancer specialist.  10/02/22 patient had BM overnight no longer with bowel obstruction does have NG still. Remains in AF on heparin, but vitals improved with low dose neo now. Palliative oncology following.  Patient wishes to have cancer therapy if possible. WBC count is normalized today.  10/03/22 : As patient was off pressors care was transitioned to hospitalist service.  No overnight events as per nursing.  Blood pressure remained stable off pressors with few soft readings.  Improvement in renal functions.  Nephrology following .patient tolerating full liquid diet.  NG can come out tomorrow  Assessment and Plan:  #Acute hypoxic respiratory failure secondary secondary to bilateral pleural effusions and atelectasis  - Supplemental O2 for dyspnea and/or hypoxia  - Maintain O2 sats 92% or higher  - Prn bronchodilator therapy  - Aggressive pulmonary hygiene - Intermittent CXR and ABG's  - Consider thoracentesis  -s/p throacentesis- 900cc removed  -atelelctasis noted - IS and metaneb ordered , discussed with RT today    Acute Kidney Injury -KDIGO 3   - multifactorial including ischemic/metabolic -consider nephrology consultation  -IVF    #Atrial fibrillation with rvr  #Hypotension secondary to antiarrhythmic medication  - Continuous telemetry monitoring - Will check troponin and BNP  - Cardiology consulted appreciate input: recommending if renal function normal can consider digoxin, however if pt has evidence of renal failure recommend amiodarone gtt due to cardizem gtt causing hypotension  - Echo pending  - Continue heparin gtt: dosing per pharmacy  - Pp rn neo-synephrine gtt to maintain ma>65 - Hold outpatient antihypertensives    #Acalculous emphysematous cholecystitis : Status post decompression now with JP drain in place. - Trend WBC and monitor fever  curve  - Continue  abx therapy as outlined above  - General surgery consulted: recommended decompression of the gallbladder via percutaneous cholecystostomy per IR and continue abx therapy.  Procedure canceled 07/15 due to pt developing atrial fibrillation with rvr  - Prn oxycodone/fentanyl/morphine for pain management  -zosyn-     #Primary metastatic adenocarcinoma of the pancreas  - Oncology consulted appreciate input    #Type I diabetes mellitus  - CBG's ac/hs - SSI        Subjective: Patient seen and examined at bedside this morning.  No overnight events as per nursing.  Vital labs and imaging reviewed.  Patient was off pressors this morning.  Improvement in renal functions.  Nephrology, oncology following.  Physical Exam: Vitals:   10/03/22 0700 10/03/22 0800 10/03/22 0844 10/03/22 0900  BP: 111/63 90/64  106/68  Pulse: 95 89  83  Resp: (!) 28 20  (!) 22  Temp:   97.9 F (36.6 C)   TempSrc:   Oral   SpO2: 95% 99%  91%  Weight:      Height:       Physical Exam Constitutional:      General: He is in acute distress.     Appearance: He is not toxic-appearing.  Eyes:     Extraocular Movements: Extraocular movements intact.     Pupils: Pupils are equal, round, and reactive to light.  Neurological:     Mental Status: He is disoriented.   Lungs : Nonlabored no accessory muscle use.  Breath sounds diminished. Cardiovascular : Irregularly irregular with no grade 3 murmurs.  No pitting edema, no JVD Abdomen : NG in place, hypoactive bowel sounds  Neuro :  Follows command, moving all extremities pupil reactive to light and accommodation Genitourinary : Indwelling Foley catheter in place.  Data Reviewed:  There are no new results to review at this time.  Family Communication: Wife updated by bedside  Disposition: Status is: Inpatient Remains inpatient appropriate because: Critical medical condition  Planned Discharge Destination:  TBD     Time spent: 34  minutes  Author: Kirstie Peri, MD 10/03/2022 11:13 AM  For on call review [email or web address].

## 2022-10-03 NOTE — Progress Notes (Signed)
[facility]  ROUNDING NOTE   Subjective:   77 yo male who presented to [facility] ER on 07/13 with acute onset of severe abdominal/chest pain.  He was previously admitted at [facility] from 09/21/22-09/25/22 for abdominal pain along with unintentional weight loss.  During hospitalization at [facility] workup concerning for primary metastatic pancreatic adenocarcinoma.  He underwent ERCP/US with biopsy and stent placed on 09/23/22.  [name] is at bedside.  He currently has an NG tube and is allowed to take and only small amount of clears. Creatinine today is down to 2.16 compared to 2.5 from yesterday. Urine output of 1680 cc.    Objective:  Vital signs in last 24 hours:  Temp:  [97.6 F (36.4 C)-98.8 F (37.1 C)] 97.7 F (36.5 C) (07/20 0400) Pulse Rate:  [79-101] 95 (07/20 0700) Resp:  [15-28] 28 (07/20 0700) BP: (79-112)/(55-80) 111/63 (07/20 0700) SpO2:  [95 %-100 %] 95 % (07/20 0700) Weight:  [83 kg] 83 kg (07/20 0500)  Weight change: 1.1 kg Filed Weights   10/01/22 0500 10/02/22 0420 10/03/22 0500  Weight: 79.5 kg 81.9 kg 83 kg    Intake/Output: I/O last 3 completed shifts: In: 3691 [P.O.:1770; I.V.:1276.9; IV Piggyback:644.2] Out: 2703 [Urine:2605; Emesis/NG output:20; Drains:78]   Intake/Output this shift:  Total I/O In: -  Out: 70 [Drains:70]  Physical Exam: General: NAD, cachectic, laying in bed  Head: Normocephalic, atraumatic. Moist oral mucosal membranes  Eyes: Anicteric,  Neck: Supple, trachea midline  Lungs:  [place] O2, normal breathing effort.  Heart: Irregular rhythm  Abdomen:  Soft, +epigastric tenderness, RUQ drain  Extremities:  no peripheral edema.  Neurologic: Alert, able to follow simple commands.  Skin: No lesions  Access: none    Basic Metabolic Panel: Recent Labs  Lab 09/29/22 0949 09/30/22 0334 10/01/22 0448 10/01/22 2000 10/02/22 0423 10/03/22 0406 10/03/22 0408  NA 134* 133* 133*  --  133*  --  132*  K 3.7 3.7 3.0* 3.3*  3.0*  --  2.9*  CL 97* 98 98  --  100  --  101  CO2 20* 22 22  --  22  --  20*  GLUCOSE 176* 138* 132*  --  119*  --  144*  BUN 85* 89* 86*  --  76*  --  65*  CREATININE 3.17* 3.05* 2.96*  --  2.49*  --  2.16*  CALCIUM 7.0* 7.1* 6.9*  --  6.9*  --  7.2*  MG 2.4 2.5* 2.5*  --  2.4 2.5*  --   PHOS 8.8* 7.7* 6.7*  --  4.4  --  3.7    Liver Function Tests: Recent Labs  Lab 09/29/22 0949 09/30/22 0334 10/01/22 0448 10/02/22 0423 10/02/22 1044 10/03/22 0408  AST 222* 424*  --   --  844*  --   ALT 106* 174*  --   --  310*  --   ALKPHOS 192* 277*  --   --  685*  --   BILITOT 1.6* 1.6*  --   --  2.5*  --   PROT 5.6* 5.2*  --   --  4.7*  --   ALBUMIN 2.0* 1.9* 1.7* 2.0* 1.9* 1.9*   No results for input(s): "LIPASE", "AMYLASE" in the last 168 hours.  No results for input(s): "AMMONIA" in the last 168 hours.  CBC: Recent Labs  Lab 09/29/22 0201 09/29/22 1139 09/30/22 0334 10/01/22 0448 10/02/22 0423 10/03/22 0408  WBC 20.7*  --  17.7* 14.1* 9.0 11.8*  HGB 12.8* 13.3 12.4* 12.1* 10.3* 11.4*  HCT 37.3*  --  34.5* 33.3* 28.7* 31.8*  MCV 81.3  --  77.5* 75.9* 77.2* 75.7*  PLT 333  --  288 273 187 206    Cardiac Enzymes: No results for input(s): "CKTOTAL", "CKMB", "CKMBINDEX", "TROPONINI" in the last 168 hours.  BNP: Invalid input(s): "POCBNP"  CBG: Recent Labs  Lab 10/02/22 1536 10/02/22 1937 10/03/22 0002 10/03/22 0436 10/03/22 0729  GLUCAP 148* 146* 150* 150* 134*    Microbiology: Results for orders placed or performed during the hospital encounter of 09/26/22  MRSA Next Gen by PCR, Nasal     Status: None   Collection Time: 09/28/22  4:12 PM   Specimen: Nasal Mucosa; Nasal Swab  Result Value Ref Range Status   MRSA by PCR Next Gen NOT DETECTED NOT DETECTED Final    Comment: (NOTE) The GeneXpert MRSA Assay (FDA approved for NASAL specimens only), is one component of a comprehensive MRSA colonization surveillance program. It is not intended to diagnose MRSA  infection nor to guide or monitor treatment for MRSA infections. Test performance is not FDA approved in patients less than 76 years old. Performed at [facility], [address] [place], [place] [address]   Aerobic/Anaerobic Culture w Gram Stain (surgical/deep wound)     Status: None (Preliminary result)   Collection Time: 09/30/22 12:51 PM   Specimen: Gallbladder; Abscess  Result Value Ref Range Status   Specimen Description   Final    GALL BLADDER Performed at [facility], [address], [place], [place] [address]    Special Requests   Final    NONE Performed at [facility], [address] [place], [place] [address]    Gram Stain   Final    NO WBC SEEN FEW GRAM VARIABLE ROD RARE GRAM POSITIVE COCCI IN PAIRS Performed at [facility], [address] [place], [place] [address]    Culture   Final    RARE CITROBACTER FREUNDII RARE MORGANELLA MORGANII ABUNDANT ENTEROCOCCUS FAECALIS SUSCEPTIBILITIES TO FOLLOW NO ANAEROBES ISOLATED; CULTURE IN PROGRESS FOR 5 DAYS    Report Status PENDING  Incomplete    Coagulation Studies: No results for input(s): "LABPROT", "INR" in the last 72 hours.   Urinalysis: No results for input(s): "COLORURINE", "LABSPEC", "PHURINE", "GLUCOSEU", "HGBUR", "BILIRUBINUR", "KETONESUR", "PROTEINUR", "UROBILINOGEN", "NITRITE", "LEUKOCYTESUR" in the last 72 hours.  Invalid input(s): "APPERANCEUR"     Imaging: No results found.   Medications:    sodium chloride Stopped (09/29/22 1140)   albumin human Stopped (10/02/22 1900)   amiodarone 30 mg/hr (10/03/22 0545)   heparin 1,100 Units/hr (10/03/22 0545)   piperacillin-tazobactam (ZOSYN)  IV 12.5 mL/hr at 10/03/22 0545   potassium chloride     promethazine (PHENERGAN) injection (IM or IVPB)      atorvastatin  40 mg Oral q morning   Chlorhexidine Gluconate Cloth  6 each Topical QHS   docusate sodium  100 mg Oral BID   feeding supplement  237 mL Oral TID  BM   insulin aspart  0-9 Units Subcutaneous Q4H   midodrine  10 mg Oral TID WC   multivitamin with minerals  1 tablet Oral Daily   sodium chloride flush  5 mL Intracatheter Q8H   fentaNYL (SUBLIMAZE) injection, iohexol, levalbuterol, metoprolol tartrate, ondansetron **OR** ondansetron (ZOFRAN) IV, oxyCODONE, polyethylene glycol, promethazine (PHENERGAN) injection (IM or IVPB), traMADol, traZODone  Assessment/ Plan:  Mr. CAYSEN WHANG is a 77 y.o.  male  with atrial fibrillation, pancreatic cancer, diabetes mellitus type II, hyperlipidemia, hypertension, who was admitted to [facility] on 09/26/2022 for Generalized abdominal pain [R10.84] Adenocarcinoma of pancreas (HCC) [C25.9] Primary pancreatic cancer with metastasis to other site [facility]) [C25.9]  Nephrology consulted for acute kidney injury.    Acute Kidney Injury: baseline creatinine of 0.97 with GFR >60 on admission, 7/13. VI contrast exposure on 7/13. Suspect IV contrast induced nephropathy plus prerenal azotemia. - creatinine slowly improving. Nonoliguric urine output.  - holding home lisinopril -Continue to monitor.   A-fib.  RVR causing hypotension and hemodynamic in the debility.  Cardiology team on board. Acalculous emphysematous cholecystitis-status post decompression.  Patient now with JP drain. 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 Diabetes type 2-hemoglobin A1c 7.0% from 09/26/2022.   LOS: 6 Julieann Drummonds 7/20/20248:22 AM

## 2022-10-03 NOTE — Progress Notes (Signed)
ANTICOAGULATION CONSULT NOTE   Pharmacy Consult for IV heparin Indication: atrial fibrillation  No Known Allergies  Patient Measurements: Height: 6' (182.9 cm) Weight: 83 kg (182 lb 15.7 oz) IBW/kg (Calculated) : 77.6 Heparin Dosing Weight: 73.9 kg  Vital Signs: Temp: 97.9 F (36.6 C) (07/20 0844) Temp Source: Oral (07/20 0844) BP: 106/68 (07/20 0900) Pulse Rate: 83 (07/20 0900)  Labs: Recent Labs     0000 10/01/22 0013 10/01/22 0448 10/01/22 0839 10/02/22 0423 10/02/22 1338 10/02/22 2302 10/03/22 0408 10/03/22 0751  HGB   < >  --  12.1*  --  10.3*  --   --  11.4*  --   HCT  --   --  33.3*  --  28.7*  --   --  31.8*  --   PLT  --   --  273  --  187  --   --  206  --   APTT  --  128*  --   --   --   --   --   --   --   HEPARINUNFRC  --  0.38  --    < > 0.25* 0.29* 0.38  --  0.32  CREATININE  --   --  2.96*  --  2.49*  --   --  2.16*  --    < > = values in this interval not displayed.    Estimated Creatinine Clearance: 32.4 mL/min (A) (by C-G formula based on SCr of 2.16 mg/dL (H)).   Medical History: Past Medical History:  Diagnosis Date   Atrial fibrillation (HCC)    Cancer (HCC)    pancreatic   Diabetes mellitus without complication (HCC)    Hypercholesteremia    Hypertension     Medications:  Eliquis 5 mg BID (last dose 09/24/22)  Assessment: 77 year old male with PMH atrial fibrillation on Eliquis prior to admission. Plan was for cholecystectomy drain placement, but was cancelled due to acute Afib.   Goal of Therapy:  HL 0.3 - 0.7  Monitor platelets by anticoagulation protocol: Yes   Plan:  - Heparin level is therapeutic x2 - continue heparin infusion at 1100 units/hr - Recheck heparin level with AM labs - CBC daily while on heparin.    Bettey Costa, PharmD Clinical Pharmacist 10/03/2022 10:33 AM

## 2022-10-03 NOTE — Consult Note (Signed)
PHARMACY CONSULT NOTE - FOLLOW UP  Pharmacy Consult for Electrolyte Monitoring and Replacement   Recent Labs: Potassium (mmol/L)  Date Value  10/03/2022 2.9 (L)  09/24/2013 3.8   Magnesium (mg/dL)  Date Value  09/60/4540 2.5 (H)   Calcium (mg/dL)  Date Value  98/01/9146 7.2 (L)   Calcium, Total (mg/dL)  Date Value  82/95/6213 8.8   Albumin (g/dL)  Date Value  08/65/7846 1.9 (L)   Phosphorus (mg/dL)  Date Value  96/29/5284 3.7   Sodium (mmol/L)  Date Value  10/03/2022 132 (L)  04/09/2021 141  09/24/2013 141   Corrected Calcium: 8.88  Assessment: 77 y.o. 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  He presented to the [facility] ED 2 days later with abdominal pain and dyspnea.  Imaging has shown progressive cholecystitis, now with possible emphysematous changes and perforation with perihepatic fluid.  With this, he has progressed in septic shock and multiorgan failure. Pharmacy has been consulted to monitor and replace electrolytes while under PCCM care.  Goal of Therapy:  Electrolytes WNL  Plan:  - K 3.0: Kcl x 6 doses ordered by CCNP - Will recheck electrolytes with AM labs   Bettey Costa ,PharmD Clinical Pharmacist 10/03/2022 7:30 AM

## 2022-10-04 DIAGNOSIS — R1084 Generalized abdominal pain: Secondary | ICD-10-CM | POA: Diagnosis not present

## 2022-10-04 DIAGNOSIS — C259 Malignant neoplasm of pancreas, unspecified: Secondary | ICD-10-CM | POA: Diagnosis not present

## 2022-10-04 DIAGNOSIS — K810 Acute cholecystitis: Secondary | ICD-10-CM | POA: Diagnosis not present

## 2022-10-04 DIAGNOSIS — I4891 Unspecified atrial fibrillation: Secondary | ICD-10-CM | POA: Diagnosis not present

## 2022-10-04 LAB — HEPATIC FUNCTION PANEL
ALT: 288 U/L — ABNORMAL HIGH (ref 0–44)
AST: 607 U/L — ABNORMAL HIGH (ref 15–41)
Albumin: 1.9 g/dL — ABNORMAL LOW (ref 3.5–5.0)
Alkaline Phosphatase: 767 U/L — ABNORMAL HIGH (ref 38–126)
Bilirubin, Direct: 0.9 mg/dL — ABNORMAL HIGH (ref 0.0–0.2)
Indirect Bilirubin: 0.7 mg/dL (ref 0.3–0.9)
Total Bilirubin: 1.6 mg/dL — ABNORMAL HIGH (ref 0.3–1.2)
Total Protein: 4.8 g/dL — ABNORMAL LOW (ref 6.5–8.1)

## 2022-10-04 LAB — BASIC METABOLIC PANEL
Anion gap: 11 (ref 5–15)
BUN: 59 mg/dL — ABNORMAL HIGH (ref 8–23)
CO2: 18 mmol/L — ABNORMAL LOW (ref 22–32)
Calcium: 7 mg/dL — ABNORMAL LOW (ref 8.9–10.3)
Chloride: 99 mmol/L (ref 98–111)
Creatinine, Ser: 1.99 mg/dL — ABNORMAL HIGH (ref 0.61–1.24)
GFR, Estimated: 34 mL/min — ABNORMAL LOW (ref >60)
Glucose, Bld: 133 mg/dL — ABNORMAL HIGH (ref 70–99)
Potassium: 3.1 mmol/L — ABNORMAL LOW (ref 3.5–5.1)
Sodium: 128 mmol/L — ABNORMAL LOW (ref 135–145)

## 2022-10-04 LAB — RENAL FUNCTION PANEL
Albumin: 1.8 g/dL — ABNORMAL LOW (ref 3.5–5.0)
Anion gap: 9 (ref 5–15)
BUN: 58 mg/dL — ABNORMAL HIGH (ref 8–23)
CO2: 20 mmol/L — ABNORMAL LOW (ref 22–32)
Calcium: 7.1 mg/dL — ABNORMAL LOW (ref 8.9–10.3)
Chloride: 100 mmol/L (ref 98–111)
Creatinine, Ser: 2.04 mg/dL — ABNORMAL HIGH (ref 0.61–1.24)
GFR, Estimated: 33 mL/min — ABNORMAL LOW (ref >60)
Glucose, Bld: 133 mg/dL — ABNORMAL HIGH (ref 70–99)
Phosphorus: 3.3 mg/dL (ref 2.5–4.6)
Potassium: 3.2 mmol/L — ABNORMAL LOW (ref 3.5–5.1)
Sodium: 129 mmol/L — ABNORMAL LOW (ref 135–145)

## 2022-10-04 LAB — CBC
HCT: 31.1 % — ABNORMAL LOW (ref 39.0–52.0)
Hemoglobin: 11.2 g/dL — ABNORMAL LOW (ref 13.0–17.0)
MCH: 27.6 pg (ref 26.0–34.0)
MCHC: 36 g/dL (ref 30.0–36.0)
MCV: 76.6 fL — ABNORMAL LOW (ref 80.0–100.0)
Platelets: 196 10*3/uL (ref 150–400)
RBC: 4.06 MIL/uL — ABNORMAL LOW (ref 4.22–5.81)
RDW: 14 % (ref 11.5–15.5)
WBC: 15.1 10*3/uL — ABNORMAL HIGH (ref 4.0–10.5)
nRBC: 0 % (ref 0.0–0.2)

## 2022-10-04 LAB — HEPARIN LEVEL (UNFRACTIONATED)
Heparin Unfractionated: 0.27 IU/mL — ABNORMAL LOW (ref 0.30–0.70)
Heparin Unfractionated: 0.28 IU/mL — ABNORMAL LOW (ref 0.30–0.70)

## 2022-10-04 LAB — GLUCOSE, CAPILLARY
Glucose-Capillary: 128 mg/dL — ABNORMAL HIGH (ref 70–99)
Glucose-Capillary: 130 mg/dL — ABNORMAL HIGH (ref 70–99)
Glucose-Capillary: 133 mg/dL — ABNORMAL HIGH (ref 70–99)
Glucose-Capillary: 138 mg/dL — ABNORMAL HIGH (ref 70–99)
Glucose-Capillary: 138 mg/dL — ABNORMAL HIGH (ref 70–99)
Glucose-Capillary: 143 mg/dL — ABNORMAL HIGH (ref 70–99)
Glucose-Capillary: 144 mg/dL — ABNORMAL HIGH (ref 70–99)

## 2022-10-04 LAB — LIPASE, BLOOD: Lipase: 29 U/L (ref 11–51)

## 2022-10-04 MED ORDER — POTASSIUM CHLORIDE 10 MEQ/100ML IV SOLN
10.0000 meq | INTRAVENOUS | Status: AC
  Administered 2022-10-04 (×4): 10 meq via INTRAVENOUS
  Filled 2022-10-04 (×4): qty 100

## 2022-10-04 MED ORDER — HEPARIN BOLUS VIA INFUSION
1200.0000 [IU] | Freq: Once | INTRAVENOUS | Status: AC
  Administered 2022-10-04: 1200 [IU] via INTRAVENOUS
  Filled 2022-10-04: qty 1200

## 2022-10-04 NOTE — Progress Notes (Addendum)
Hematology/Oncology Progress note [phone] [phone]     Patient Care Team: Dondra Spry, MD as PCP - General (Internal Medicine) Clinic, General Medical [facility] Medicine)   Name of the patient: Cameron Gutierrez  [id number]  1945/05/19  Date of visit: 10/04/22   INTERVAL HISTORY-   Remains in ICU for atrial fibrillation with rapid ventricular response.  On amiodarone drip, heparin drip 10/01/2022 S/p GB drain  Pain is better controlled. Speech swallow evaluation, he tolerates full liquid diet.  Still has NG tube in Granddaughter another family members are at bedside.   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) 09/26/2022   Permanent atrial fibrillation (HCC) 09/26/2022   Hyperglycemia 03/27/2022   Pseudohyponatremia 03/27/2022   Chronic kidney disease, stage 3b (HCC) 03/27/2022   Essential hypertension 03/27/2022   Mixed hyperlipidemia 03/27/2022   History of atrial fibrillation 03/27/2022     Past Medical History:  Diagnosis Date   Atrial fibrillation (HCC)    Cancer (HCC)    pancreatic   Diabetes mellitus without complication (HCC)    Hypercholesteremia    Hypertension      Past Surgical History:  Procedure Laterality Date   KNEE SURGERY     REPLACEMENT TOTAL KNEE Right    SHOULDER SURGERY      Social History   Socioeconomic History   Marital status: Married    Spouse name: Not on file   Number of children: Not on file   Years of education: Not on file   Highest education  level: Not on file  Occupational History   Not on file  Tobacco Use   Smoking status: Never   Smokeless tobacco: Never  Substance and Sexual Activity   Alcohol use: Not Currently   Drug use: Never   Sexual activity: Not on file  Other Topics Concern   Not on file  Social History Narrative   ** Merged History Encounter **       Social Determinants of Health   Financial Resource Strain: Low Risk  (09/21/2022)   Received from [facility]   Overall Financial Resource Strain (CARDIA)    Difficulty of Paying Living Expenses: Not very hard  Food Insecurity: No Food Insecurity (09/26/2022)   Hunger Vital Sign    Worried About Running Out of Food in the Last Year: Never true    Ran Out of Food in the Last Year: Never true  Transportation Needs: No Transportation Needs (09/26/2022)   PRAPARE - Administrator, Civil Service (Medical): No    Lack of Transportation (Non-Medical): No  Physical Activity: Not on file  Stress: No Stress Concern Present (09/21/2022)   Received from [facility] - Occupational Stress Questionnaire    Feeling of Stress : Not at all  Social Connections: Not on file  Intimate Partner Violence: Not At Risk (09/26/2022)   Humiliation, Afraid, Rape, and Kick questionnaire    Fear of Current or Ex-Partner: No    Emotionally Abused: No    Physically Abused: No  Sexually Abused: No     History reviewed. No pertinent family history.   Current Facility-Administered Medications:    0.9 %  sodium chloride infusion, 250 mL, Intravenous, Continuous, Ezequiel Essex, NP, Last Rate: 10 mL/hr at 10/04/22 1200, Infusion Verify at 10/04/22 1200   [COMPLETED] amiodarone (NEXTERONE) 1.8 mg/mL load via infusion 150 mg, 150 mg, Intravenous, Once, 150 mg at 09/28/22 1934 **FOLLOWED BY** [EXPIRED] amiodarone (NEXTERONE PREMIX) 360-4.14 MG/200ML-% (1.8 mg/mL) IV infusion, 60 mg/hr, Intravenous, Continuous, Stopped at 09/29/22  0151 **FOLLOWED BY** amiodarone (NEXTERONE PREMIX) 360-4.14 MG/200ML-% (1.8 mg/mL) IV infusion, 30 mg/hr, Intravenous, Continuous, Enedina Finner, MD, Last Rate: 16.67 mL/hr at 10/04/22 1200, 30 mg/hr at 10/04/22 1200   atorvastatin (LIPITOR) tablet 40 mg, 40 mg, Oral, q morning, Enedina Finner, MD, 40 mg at 10/04/22 7829   Chlorhexidine Gluconate Cloth 2 % PADS 6 each, 6 each, Topical, QHS, Enedina Finner, MD, 6 each at 10/03/22 2208   docusate sodium (COLACE) capsule 100 mg, 100 mg, Oral, BID, Enedina Finner, MD, 100 mg at 10/02/22 0957   feeding supplement (ENSURE ENLIVE / ENSURE PLUS) liquid 237 mL, 237 mL, Oral, TID BM, Enedina Finner, MD, 237 mL at 10/04/22 1009   fentaNYL (SUBLIMAZE) injection 50 mcg, 50 mcg, Intravenous, Q4H PRN, Enedina Finner, MD, 50 mcg at 10/03/22 1327   heparin ADULT infusion 100 units/mL (25000 units/272mL), 1,250 Units/hr, Intravenous, Continuous, Angelique Blonder, RPH, Last Rate: 12.5 mL/hr at 10/04/22 1200, 1,250 Units/hr at 10/04/22 1200   insulin aspart (novoLOG) injection 0-9 Units, 0-9 Units, Subcutaneous, Q4H, Rust-Chester, Britton L, NP, 1 Units at 10/04/22 0955   iohexol (OMNIPAQUE) 9 MG/ML oral solution 500 mL, 500 mL, Oral, Once PRN, Ezequiel Essex, NP, 500 mL at 09/29/22 1616   levalbuterol (XOPENEX) nebulizer solution 0.63 mg, 0.63 mg, Nebulization, Q6H PRN, Ezequiel Essex, NP   metoprolol tartrate (LOPRESSOR) injection 5 mg, 5 mg, Intravenous, Q4H PRN, Enedina Finner, MD, 5 mg at 09/28/22 1456   midodrine (PROAMATINE) tablet 10 mg, 10 mg, Oral, TID WC, Harlon Ditty D, NP, 10 mg at 10/04/22 0957   multivitamin with minerals tablet 1 tablet, 1 tablet, Oral, Daily, Enedina Finner, MD, 1 tablet at 10/04/22 0956   ondansetron (ZOFRAN) tablet 4 mg, 4 mg, Oral, Q6H PRN **OR** ondansetron (ZOFRAN) injection 4 mg, 4 mg, Intravenous, Q6H PRN, Enedina Finner, MD, 4 mg at 09/28/22 1638   oxyCODONE (Oxy IR/ROXICODONE) immediate release tablet 10 mg, 10 mg, Oral, Q4H PRN, Enedina Finner, MD, 10  mg at 09/29/22 2215   piperacillin-tazobactam (ZOSYN) IVPB 3.375 g, 3.375 g, Intravenous, Q8H, Rust-Chester, Cecelia Byars, NP, Paused at 10/04/22 0914   polyethylene glycol (MIRALAX / GLYCOLAX) packet 17 g, 17 g, Oral, Daily PRN, Enedina Finner, MD   potassium chloride 10 mEq in 100 mL IVPB, 10 mEq, Intravenous, Q1 Hr x 4, Nazari, Walid A, RPH   promethazine (PHENERGAN) 12.5 mg in sodium chloride 0.9 % 50 mL IVPB, 12.5 mg, Intravenous, Q6H PRN, Enedina Finner, MD   sodium chloride flush (NS) 0.9 % injection 5 mL, 5 mL, Intracatheter, Q8H, Suttle, Dylan J, MD, 5 mL at 10/04/22 0553   traMADol (ULTRAM) tablet 50 mg, 50 mg, Oral, Q6H PRN, Enedina Finner, MD, 50 mg at 09/27/22 2252   traZODone (DESYREL) tablet 25 mg, 25 mg, Oral, QHS PRN, Enedina Finner, MD, 25 mg at 10/03/22 2209   Physical exam:  Vitals:   10/04/22 0900 10/04/22 1000 10/04/22 1100 10/04/22 1200  BP: 98/67 (!) 87/66 104/69  98/73  Pulse: 92 (!) 102  99  Resp: 18 (!) 21 20 20   Temp:      TempSrc:      SpO2: 97% 98%  96%  Weight:      Height:       Physical Exam Constitutional:      General: He is not in acute distress.    Appearance: He is not diaphoretic.  HENT:     Head: Normocephalic and atraumatic.  Eyes:     General: No scleral icterus. Cardiovascular:     Rate and Rhythm: Tachycardia present. Rhythm irregular.  Pulmonary:     Effort: Pulmonary effort is normal. No respiratory distress.  Abdominal:     General: There is no distension.  Musculoskeletal:        General: Normal range of motion.  Skin:    General: Skin is warm.     Findings: No erythema.  Neurological:     Mental Status: He is alert and oriented to person, place, and time. Mental status is at baseline.     Cranial Nerves: No cranial nerve deficit.     Motor: No abnormal muscle tone.  Psychiatric:        Mood and Affect: Mood and affect normal.     + NG tube  Labs    Latest Ref Rng & Units 10/04/2022    3:57 AM 10/03/2022    4:08 AM 10/02/2022     4:23 AM  CBC  WBC 4.0 - 10.5 K/uL 15.1  11.8  9.0   Hemoglobin 13.0 - 17.0 g/dL 11.9  14.7  82.9   Hematocrit 39.0 - 52.0 % 31.1  31.8  28.7   Platelets 150 - 400 K/uL 196  206  187       Latest Ref Rng & Units 10/04/2022    3:58 AM 10/04/2022    3:57 AM 10/03/2022    4:08 AM  CMP  Glucose 70 - 99 mg/dL 562  130  865   BUN 8 - 23 mg/dL 58  59  65   Creatinine 0.61 - 1.24 mg/dL 7.84  6.96  2.95   Sodium 135 - 145 mmol/L 129  128  132   Potassium 3.5 - 5.1 mmol/L 3.2  3.1  2.9   Chloride 98 - 111 mmol/L 100  99  101   CO2 22 - 32 mmol/L 20  18  20    Calcium 8.9 - 10.3 mg/dL 7.1  7.0  7.2      RADIOGRAPHIC STUDIES: I have personally reviewed the radiological images as listed and agreed with the findings in the report. DG Abd 1 View  Result Date: 09/30/2022 CLINICAL DATA:  NG tube placement EXAM: ABDOMEN - 1 VIEW COMPARISON:  CT abdomen/pelvis 1 day prior FINDINGS: The enteric catheter tip and sidehole are in the stomach. The stomach remains significantly distended. Air in the gallbladder lumen and wall is noted consistent with the findings of emphysematous cholecystitis seen on CT from 1 day prior. The free intraperitoneal air seen on that study is not definitely seen on this study. There is gaseous distention of the bowel in the imaged abdomen. A biliary stent is noted. IMPRESSION: 1. Enteric catheter tip and sidehole in the stomach which remains distended. 2. Air in the gallbladder lumen and wall consistent with the findings of emphysematous cholecystitis on the CT study from 1 day prior. The pneumoperitoneum seen on that study is not appreciated on the current study. Electronically Signed   By:  Lesia Hausen M.D.   On: 09/30/2022 13:28   CT PERC CHOLECYSTOSTOMY  Result Date: 09/30/2022 INDICATION: 77 year old male with history recently diagnosed advanced stage pancreatic cancer with biliary obstruction status post covered common bile duct stent placement at outside facility approximally 1  week ago, now with emphysematous/ruptured acute cholecystitis with associated perihepatic fluid collection. EXAM: CT PERC DRAIN PERITONEAL ABCESS; CT PERCUTANEOUS CHOLECYSTOSTOMY COMPARISON:  None Available. MEDICATIONS: The patient is currently admitted to the hospital and receiving intravenous antibiotics. The antibiotics were administered within an appropriate time frame prior to the initiation of the procedure. ANESTHESIA/SEDATION: Moderate (conscious) sedation was employed during this procedure. A total of Versed 0.5 mg and Fentanyl 25 mcg was administered intravenously. Moderate Sedation Time: 16 minutes. The patient's level of consciousness and vital signs were monitored continuously by radiology nursing throughout the procedure under my direct supervision. CONTRAST:  None COMPLICATIONS: None immediate. PROCEDURE: RADIATION DOSE REDUCTION: This exam was performed according to the departmental dose-optimization program which includes automated exposure control, adjustment of the mA and/or kV according to patient size and/or use of iterative reconstruction technique. Informed written consent was obtained from the patient after a discussion of the risks, benefits and alternatives to treatment. The patient was placed supine on the CT gantry and a pre procedural CT was performed re-demonstrating the known abscess/fluid collection within the gallbladder and perihepatic region. The procedure was planned. A timeout was performed prior to the initiation of the procedure. The right upper quadrant was prepped and draped in the usual sterile fashion. The overlying soft tissues were anesthetized with 1% lidocaine with epinephrine. Appropriate trajectories were planned with the use of a 22 gauge spinal needle. Two separate 18 gauge trocar needles were advanced into the gallbladder as well as into the perihepatic fluid collection and short Amplatz super stiff wires were coiled within the gallbladder and perihepatic fluid  collection. Appropriate positioning was confirmed with a limited CT scan. The tracks were serially dilated allowing placement of 2, 10 French all-purpose drainage catheters. Appropriate positioning was confirmed with a limited postprocedural CT scan. Approximately 100 ml of bilious and purulent fluid was aspirated from the gallbladder in a proximally 500 mL of similar appearing, bilious and purulent fluid was aspirated from the perihepatic fluid collection. The drains were connected to a bulb suction and sutured in place. Dressings were placed. The patient tolerated the procedure well without immediate post procedural complication. IMPRESSION: 1. Successful CT guided placement of a 10 French percutaneous cholecystostomy drain with aspiration of 100 mL of bilious and purulent fluid. Samples were sent to the laboratory as requested by the ordering clinical team. 2. Successful CT guided placement of a 10 French right upper quadrant perihepatic fluid collection drain with aspiration of 500 mL of bilious and purulent fluid. Marliss Coots, MD [facility] Radiology Electronically Signed   By: Marliss Coots M.D.   On: 09/30/2022 13:19   CT GUIDED PERITONEAL/RETROPERITONEAL FLUID DRAIN BY PERC CATH  Result Date: 09/30/2022 INDICATION: 77 year old male with history recently diagnosed advanced stage pancreatic cancer with biliary obstruction status post covered common bile duct stent placement at outside facility approximally 1 week ago, now with emphysematous/ruptured acute cholecystitis with associated perihepatic fluid collection. EXAM: CT PERC DRAIN PERITONEAL ABCESS; CT PERCUTANEOUS CHOLECYSTOSTOMY COMPARISON:  None Available. MEDICATIONS: The patient is currently admitted to the hospital and receiving intravenous antibiotics. The antibiotics were administered within an appropriate time frame prior to the initiation of the procedure. ANESTHESIA/SEDATION: Moderate  (conscious) sedation was  employed during this procedure. A total of Versed 0.5 mg and Fentanyl 25 mcg was administered intravenously. Moderate Sedation Time: 16 minutes. The patient's level of consciousness and vital signs were monitored continuously by radiology nursing throughout the procedure under my direct supervision. CONTRAST:  None COMPLICATIONS: None immediate. PROCEDURE: RADIATION DOSE REDUCTION: This exam was performed according to the departmental dose-optimization program which includes automated exposure control, adjustment of the mA and/or kV according to patient size and/or use of iterative reconstruction technique. Informed written consent was obtained from the patient after a discussion of the risks, benefits and alternatives to treatment. The patient was placed supine on the CT gantry and a pre procedural CT was performed re-demonstrating the known abscess/fluid collection within the gallbladder and perihepatic region. The procedure was planned. A timeout was performed prior to the initiation of the procedure. The right upper quadrant was prepped and draped in the usual sterile fashion. The overlying soft tissues were anesthetized with 1% lidocaine with epinephrine. Appropriate trajectories were planned with the use of a 22 gauge spinal needle. Two separate 18 gauge trocar needles were advanced into the gallbladder as well as into the perihepatic fluid collection and short Amplatz super stiff wires were coiled within the gallbladder and perihepatic fluid collection. Appropriate positioning was confirmed with a limited CT scan. The tracks were serially dilated allowing placement of 2, 10 French all-purpose drainage catheters. Appropriate positioning was confirmed with a limited postprocedural CT scan. Approximately 100 ml of bilious and purulent fluid was aspirated from the gallbladder in a proximally 500 mL of similar appearing, bilious and purulent fluid was aspirated from the perihepatic fluid  collection. The drains were connected to a bulb suction and sutured in place. Dressings were placed. The patient tolerated the procedure well without immediate post procedural complication. IMPRESSION: 1. Successful CT guided placement of a 10 French percutaneous cholecystostomy drain with aspiration of 100 mL of bilious and purulent fluid. Samples were sent to the laboratory as requested by the ordering clinical team. 2. Successful CT guided placement of a 10 French right upper quadrant perihepatic fluid collection drain with aspiration of 500 mL of bilious and purulent fluid. Marliss Coots, MD [facility] Radiology Electronically Signed   By: Marliss Coots M.D.   On: 09/30/2022 13:19   CT ABDOMEN PELVIS WO CONTRAST  Addendum Date: 09/29/2022   ADDENDUM REPORT: 09/29/2022 23:18 ADDENDUM: These results were called by telephone at the time of interpretation on 09/29/2022 at 11:14 pm to provider Dr. Everlene Farrier, who verbally acknowledged these results. Electronically Signed   By: Tish Frederickson M.D.   On: 09/29/2022 23:18   Result Date: 09/29/2022 CLINICAL DATA:  Bowel obstruction suspected EXAM: CT ABDOMEN AND PELVIS WITHOUT CONTRAST TECHNIQUE: Multidetector CT imaging of the abdomen and pelvis was performed following the standard protocol without IV contrast. RADIATION DOSE REDUCTION: This exam was performed according to the departmental dose-optimization program which includes automated exposure control, adjustment of the mA and/or kV according to patient size and/or use of iterative reconstruction technique. COMPARISON:  CT abdomen pelvis 09/26/2022 FINDINGS: Lower chest: Partially visualized bilateral small pleural effusions with bilateral lower lobe passive atelectasis. Hepatobiliary: Redemonstration of a couple of hepatic lesions consistent with known metastases. Air-fluid level within the gastric lumen. Air noted within the wall of the gallbladder. Common bile  duct stent in appropriate position with associated pneumobilia. Pancreas: Proximal pancreatic mass again noted in difficult to delineate on this study. Finding measures approximately 12.6 x 4.7 cm extending  along the liver and duodenum. No surrounding inflammatory changes. No main pancreatic ductal dilatation. Spleen: Normal in size without focal abnormality. Adrenals/Urinary Tract: No adrenal nodule bilaterally. No nephrolithiasis and no hydronephrosis. Several fluid density lesions likely represent simple renal cysts. Simple renal cysts, in the absence of clinically indicated signs/symptoms, require no independent follow-up. No ureterolithiasis or hydroureter. The urinary bladder is decompressed with Foley catheter tip and balloon terminating within the lumen. Stomach/Bowel: Stomach distended with air-fluid level. Bowel thickening of the first portion of the duodenum suggestive of possible metastatic invasion. No evidence of large bowel wall thickening or dilatation. Colonic diverticulosis. Appendix appears normal. Vascular/Lymphatic: No abdominal aorta or iliac aneurysm. Moderate atherosclerotic plaque of the aorta and its branches. No abdominal, pelvic, or inguinal lymphadenopathy. Reproductive: Prostate is unremarkable. Other: Interval increase in trace to small volume simple free fluid within the right upper quadrant. Associated free gas. No intraperitoneal free gas. No organized fluid collection. Musculoskeletal: Bilateral inguinal hernias containing fat and fluid. Subcutaneus soft tissue edema. No suspicious lytic or blastic osseous lesions. No acute displaced fracture. Multilevel degenerative changes of the spine. IMPRESSION: 1. Acute emphysematous cholecystitis. Recommend emergent surgical consultation. 2. Interval development of pneumoperitoneum. 3. Interval increase in small volume perihepatic ascites. 4. Gastric lumen distended with air-fluid level. Developing obstruction along the first portion of  duodenum in the region of the pancreatic malignancy not excluded. 5. Metastatic pancreatic malignancy poorly evaluated on this noncontrast study. Please see CT abdomen pelvis 09/26/2022 for further details. 6. Partially visualized bilateral small pleural effusions with bilateral lower lobe passive atelectasis. 7.  Aortic Atherosclerosis (ICD10-I70.0). These results were called by telephone at the time of interpretation on 09/29/2022 at 10:37 pm to provider Dr. Karna Christmas, who verbally acknowledged these results. Electronically Signed: By: Tish Frederickson M.D. On: 09/29/2022 22:44   US THORACENTESIS ASP PLEURAL SPACE W/IMG GUIDE  Result Date: 09/29/2022 INDICATION: Right pleural effusion.  Pancreatic cancer. EXAM: ULTRASOUND GUIDED RIGHT THORACENTESIS MEDICATIONS: None. COMPLICATIONS: None immediate. PROCEDURE: An ultrasound guided thoracentesis was thoroughly discussed with the patient and questions answered. The benefits, risks, alternatives and complications were also discussed. The patient understands and wishes to proceed with the procedure. Written consent was obtained. Ultrasound was performed to localize and mark an adequate pocket of fluid in the right chest. The area was then prepped and draped in the normal sterile fashion. 1% Lidocaine was used for local anesthesia. Under ultrasound guidance a 6 Fr Safe-T-Centesis catheter was introduced. Thoracentesis was performed. The catheter was removed and a dressing applied. FINDINGS: A total of approximately 800 mL of dark yellow fluid was removed. Samples were sent to the laboratory as requested by the clinical team. Images were also obtained of the right upper abdomen to evaluate the gallbladder. Loculated perihepatic ascites is identified. Previously identified gallbladder is poorly characterized because there appears to be air within the gallbladder. The amount of fluid around the liver has significantly increased since the right upper quadrant ultrasound  06/27/2022. IMPRESSION: 1. Successful ultrasound guided right thoracentesis yielding 800 mL of pleural fluid. 2. Images of the right upper quadrant demonstrate markedly increased perihepatic ascites. Ascites appears to be complex. In addition, the gallbladder now appears to contain a large amount of gas which is likely related to the biliary stent and pneumobilia. Electronically Signed   By: Richarda Overlie M.D.   On: 09/29/2022 15:52   DG Chest Port 1 View  Result Date: 09/29/2022 CLINICAL DATA:  Pleural effusion.  Status post thoracentesis. EXAM: PORTABLE CHEST 1 VIEW  COMPARISON:  Chest radiographs 09/28/2022 and 09/26/2022. Abdominal CT 09/26/2022. FINDINGS: 1417 hours. Interval decreased volume of right pleural effusion. A left pleural effusion and left basilar airspace disease appear unchanged. No pneumothorax. The heart size and mediastinal contours are stable. Moderate gaseous distension of the stomach noted. Curvilinear lucency in the right upper quadrant corresponds with air in the gallbladder lumen on recent CT. Patient has a biliary stent. IMPRESSION: 1. Decreased right pleural effusion following thoracentesis. No pneumothorax. 2. No significant change in left pleural effusion and left basilar airspace disease. Electronically Signed   By: Carey Bullocks M.D.   On: 09/29/2022 15:46   ECHOCARDIOGRAM COMPLETE  Result Date: 09/29/2022    ECHOCARDIOGRAM REPORT   Patient Name:   KYRI DAI Date of Exam: 09/29/2022 Medical Rec #:  [id number]       Height:       72.0 in Accession #:    [id number]      Weight:       163.0 lb Date of Birth:  [birth year]       BSA:          1.953 m Patient Age:    76 years        BP:           112/74 mmHg Patient Gender: M               HR:           115 bpm. Exam Location:  [facility] Procedure: 2D Echo, Cardiac Doppler and Color Doppler Indications:     Atrial Fibrillation I48.91  History:         Patient has no prior history of Echocardiogram examinations.                   Arrythmias:Atrial Fibrillation; Risk Factors:Diabetes and                  Hypertension.  Sonographer:     Cristela Blue Referring Phys:  [id number] SONA PATEL Diagnosing Phys: Yvonne Kendall MD IMPRESSIONS  1. Left ventricular ejection fraction, by estimation, is 60 to 65%. The left ventricle has normal function. The left ventricle has no regional wall motion abnormalities. There is mild asymmetric left ventricular hypertrophy of the basal-septal segment. Left ventricular diastolic function could not be evaluated.  2. Pulmonary artery pressure is mildly elevated (RVSP 30-35 mmHg plus central venous/right atrial pressure). Right ventricular systolic function is normal. The right ventricular size is normal.  3. Moderate pleural effusion in the right lateral region.  4. The mitral valve is normal in structure. Mild to moderate mitral valve regurgitation.  5. Tricuspid valve regurgitation is mild to moderate.  6. The aortic valve is tricuspid. Aortic valve regurgitation is mild. No aortic stenosis is present. FINDINGS  Left Ventricle: Left ventricular ejection fraction, by estimation, is 60 to 65%. The left ventricle has normal function. The left ventricle has no regional wall motion abnormalities. The left ventricular internal cavity size was normal in size. There is  mild asymmetric left ventricular hypertrophy of the basal-septal segment. Left ventricular diastolic function could not be evaluated due to atrial fibrillation. Left ventricular diastolic function could not be evaluated. Right Ventricle: Pulmonary artery pressure is mildly elevated (RVSP 30-35 mmHg plus central venous/right atrial pressure). The right ventricular size is normal. No increase in right ventricular wall thickness. Right ventricular systolic function is normal. Left Atrium: Left atrial size was normal in size. Right Atrium: Right atrial size was normal in  size. Pericardium: The pericardium was not well visualized. Mitral Valve: The mitral valve is  normal in structure. Mild to moderate mitral valve regurgitation. Tricuspid Valve: The tricuspid valve is normal in structure. Tricuspid valve regurgitation is mild to moderate. Aortic Valve: The aortic valve is tricuspid. Aortic valve regurgitation is mild. No aortic stenosis is present. Aortic valve mean gradient measures 3.0 mmHg. Aortic valve peak gradient measures 4.7 mmHg. Aortic valve area, by VTI measures 3.27 cm. Pulmonic Valve: The pulmonic valve was grossly normal. Pulmonic valve regurgitation is trivial. No evidence of pulmonic stenosis. Aorta: The aortic root is normal in size and structure. Pulmonary Artery: The pulmonary artery is not well seen. Venous: The inferior vena cava was not well visualized. IAS/Shunts: The interatrial septum was not well visualized. Additional Comments: There is a moderate pleural effusion in the right lateral region.  LEFT VENTRICLE PLAX 2D LVIDd:         3.90 cm LVIDs:         2.60 cm LV PW:         0.90 cm LV IVS:        1.20 cm LVOT diam:     2.20 cm LV SV:         47 LV SV Index:   24 LVOT Area:     3.80 cm  RIGHT VENTRICLE RV Basal diam:  3.50 cm RV Mid diam:    2.80 cm LEFT ATRIUM             Index        RIGHT ATRIUM           Index LA diam:        4.30 cm 2.20 cm/m   RA Area:     16.20 cm LA Vol (A2C):   70.1 ml 35.89 ml/m  RA Volume:   34.00 ml  17.41 ml/m LA Vol (A4C):   47.7 ml 24.42 ml/m LA Biplane Vol: 60.7 ml 31.08 ml/m  AORTIC VALVE AV Area (Vmax):    2.81 cm AV Area (Vmean):   2.73 cm AV Area (VTI):     3.27 cm AV Vmax:           108.00 cm/s AV Vmean:          74.350 cm/s AV VTI:            0.143 m AV Peak Grad:      4.7 mmHg AV Mean Grad:      3.0 mmHg LVOT Vmax:         79.80 cm/s LVOT Vmean:        53.400 cm/s LVOT VTI:          0.123 m LVOT/AV VTI ratio: 0.86  AORTA Ao Root diam: 3.66 cm MITRAL VALVE               TRICUSPID VALVE MV Area (PHT): 3.63 cm    TR Peak grad:   32.9 mmHg MV Decel Time: 209 msec    TR Vmax:        287.00 cm/s MV E  velocity: 86.50 cm/s                            SHUNTS                            Systemic VTI:  0.12 m  Systemic Diam: 2.20 cm Yvonne Kendall MD Electronically signed by Yvonne Kendall MD Signature Date/Time: 09/29/2022/2:31:41 PM    Final    US RENAL  Result Date: 09/29/2022 CLINICAL DATA:  Acute renal failure. EXAM: RENAL / URINARY TRACT ULTRASOUND COMPLETE COMPARISON:  September 26, 2022. FINDINGS: Right Kidney: Renal measurements: 9.6 x 5.1 x 4.7 cm = volume: 122 mL. At least 2 parapelvic cysts are noted, the largest measuring 2.3 cm. Increased echogenicity of renal parenchyma is noted. No mass or hydronephrosis visualized. Left Kidney: Renal measurements: 10.1 x 5.9 x 5.5 cm = volume: 170 mL. Multi septated cyst measuring 2.3 x 2.3 x 1.9 cm is noted. 2.5 cm simple cyst is noted. Increased echogenicity of renal parenchyma is noted. No hydronephrosis visualized. Bladder: Appears normal for degree of bladder distention. Other: None. IMPRESSION: 2.3 cm multi-septated complex cyst seen in left kidney. Follow-up ultrasound in 6-12 months is recommended to ensure stability. Increased echogenicity of renal parenchyma is noted bilaterally suggesting medical renal disease. No hydronephrosis or renal obstruction is noted. Electronically Signed   By: Lupita Raider M.D.   On: 09/29/2022 13:28   DG Chest Port 1 View  Result Date: 09/28/2022 CLINICAL DATA:  Shortness of breath. EXAM: PORTABLE CHEST 1 VIEW COMPARISON:  September 26, 2022 FINDINGS: Calcific atherosclerotic disease of the aorta. Cardiomediastinal silhouette is enlarged. Mediastinal contours appear intact. Bilateral pleural effusions, right greater than left. No focal airspace consolidation. Osseous structures are without acute abnormality. Soft tissues are grossly normal. IMPRESSION: 1. Bilateral pleural effusions, right greater than left. 2. Enlarged cardiomediastinal silhouette. Electronically Signed   By: Ted Mcalpine M.D.    On: 09/28/2022 15:37   US Abdomen Limited RUQ (LIVER/GB)  Result Date: 09/26/2022 CLINICAL DATA:  Abdominal pain. History of metastatic pancreatic cancer. EXAM: ULTRASOUND ABDOMEN LIMITED RIGHT UPPER QUADRANT COMPARISON:  CT abdomen pelvis from same day. FINDINGS: Gallbladder: No gallstones. Sludge with mild gallbladder wall thickening and small volume pericholecystic fluid. Positive sonographic Murphy sign noted by sonographer. Common bile duct: Diameter: 4 mm, normal.  Common bile duct stent noted. Liver: Liver lesions seen on CT are not well identified by ultrasound. Within normal limits in parenchymal echogenicity. Portal vein is patent on color Doppler imaging with normal direction of blood flow towards the liver. Other: None. IMPRESSION: 1. Gallbladder sludge with mild gallbladder wall thickening and small volume pericholecystic fluid. Positive sonographic Murphy sign. Findings are concerning for acute acalculous cholecystitis. 2. Liver metastases seen on CT are not well identified by ultrasound. Electronically Signed   By: Obie Dredge M.D.   On: 09/26/2022 14:56   CT ABDOMEN PELVIS W CONTRAST  Result Date: 09/26/2022 CLINICAL DATA:  Central chest pain. Abdominal pain. History of pancreatic cancer. EXAM: CT ABDOMEN AND PELVIS WITH CONTRAST TECHNIQUE: Multidetector CT imaging of the abdomen and pelvis was performed using the standard protocol following bolus administration of intravenous contrast. RADIATION DOSE REDUCTION: This exam was performed according to the departmental dose-optimization program which includes automated exposure control, adjustment of the mA and/or kV according to patient size and/or use of iterative reconstruction technique. CONTRAST:  80mL OMNIPAQUE IOHEXOL 300 MG/ML  SOLN COMPARISON:  Chest radiograph 09/26/2022. Outside CT[facility] from [facility] dated 09/21/2022 and 09/22/2022. Outside images are unavailable but reports are available. FINDINGS: Lower chest: Multiple small pulmonary  nodules in the visualized lungs. Index pulmonary nodule is in the right lower lobe along the right major fissure on image 33/4 measuring up to 1.3 cm. There also may be a larger nodule  in the left lower lobe measuring 1.4 cm image 15/4. Volume loss and compressive atelectasis in the left lower lobe. Compressive atelectasis in the right lower lobe. Bilateral small pleural effusions. Hepatobiliary: Multiple ill-defined hypoechoic liver lesions compatible with metastatic disease. Index lesion in the left hepatic lobe measuring 2.1 cm on image 23/2. There is gas and contrast within the gallbladder. The gallbladder is moderately distended and findings compatible with recent ERCP. Metallic biliary stent in the common bile duct. Narrowing in the biliary stent associated with the pancreatic mass. Small amount of pneumobilia. Small amount of fluid and stranding around the liver and gallbladder. Pancreas: Large poorly defined pancreatic head mass with multiple hypoechoic components. Difficult to accurately measure the mass itself but it roughly measures 4.0 x 4.9 x 5.1 cm. Dilatation of the main pancreatic duct. The mass extends cephalad and appears to be involving the caudate lobe. There is a caudate lesion measuring up to 4.1 cm. In addition, there is hypoechoic implant or fluid collection cephalad to the caudate lobe involving the hepatic subcapsular space. This subcapsular collection or implant measures 7.8 x 4.6 x 5.6 cm. This was present on the previous exam based on the prior report. This caudate/subcapsular component causing mass effect on the adjacent IVC. Mild stranding and edema throughout the porta hepatis region. Spleen: Small splenic calcifications.  Spleen is normal for size. Adrenals/Urinary Tract: Normal adrenal glands. Bilateral cortical and parapelvic renal cysts. No suspicious renal lesions. No hydronephrosis. Bladder is decompressed. There is probably a small bladder diverticulum along the posterior  aspect of the bladder. Cannot exclude mild bladder wall thickening. Asymmetric left perinephric stranding. Stomach/Bowel: Normal appearance of the stomach. No bowel dilatation. Colonic diverticula involving the sigmoid colon. No evidence for acute bowel inflammation. Vascular/Lymphatic: Atherosclerotic disease involving the abdominal aorta without aneurysm. Atherosclerotic calcifications at the origin of the SMA. Atherosclerotic calcifications at the origin of the bilateral renal arteries. IVC is patent but there is compression in the suprarenal IVC due to the subcapsular fluid collection / mass. 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 Main portal vein is small but patent. Intrahepatic portal veins are patent. 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 Splenic vein is patent. Pathologic lymphadenopathy in the retrocaval space measuring up to 1.7 cm on image 39/2. Reproductive: Prostate contains calcifications, asymmetric towards the left. Other: Small amount of free fluid in the abdomen and pelvis. Musculoskeletal: Evidence for an old right tenth rib fracture. Bilateral inguinal hernias containing fat. Small amount of fluid in the left inguinal hernia. No suspicious osseous lesion. IMPRESSION: 1. Large pancreatic head mass compatible with the patient's history of pancreatic cancer. The mass extends cephalad and involves the caudate lobe. There is also a subcapsular fluid collection or implant cephalad to the caudate lobe causing mass effect on the adjacent IVC. 2. Multiple liver lesions, abdominal lymphadenopathy and multiple pulmonary nodules. Findings compatible with metastatic disease. 3. Metallic biliary stent in the common bile duct and there is a small amount of pneumobilia. No significant intrahepatic biliary dilatation. In addition, there is moderate distention of the gallbladder containing  iodinated contrast and small amount of gas. Findings compatible with recent ERCP. Gallbladder distension could be a source of abdominal pain. 4. Bilateral small pleural effusions with compressive atelectasis in the lower lobes. 5. Small amount of free fluid in the abdomen and pelvis. 6. Colonic diverticulosis without evidence for acute diverticulitis. 7.  Aortic Atherosclerosis (ICD10-I70.0). 8. 9 mm splenic artery aneurysm. Electronically Signed   By: Richarda Overlie M.D.   On: 09/26/2022 09:50   DG Chest 2 View  Result Date: 09/26/2022 CLINICAL DATA:  77 year old male with history of central chest pain. EXAM: CHEST - 2 VIEW COMPARISON:  Chest x-ray 07/28/2014. FINDINGS: Lung volumes are low. No consolidative airspace disease. Trace bilateral pleural effusions with bibasilar opacities that are favored to reflect subsegmental atelectasis. No pneumothorax. No evidence of pulmonary edema. No definite suspicious appearing pulmonary nodules or masses are noted. Heart size is normal. Upper mediastinal contours are within normal limits. Atherosclerotic calcifications in the thoracic aorta. IMPRESSION: 1. Low lung volumes with trace bilateral pleural effusions and bibasilar opacities favored to reflect areas of subsegmental atelectasis. 2. Aortic atherosclerosis. Electronically Signed   By: Trudie Reed M.D.   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 Prognosis is poor. He is interested in future treatments, chemotherapy outpatient after his acute issues resolves.   # Acute acalculous cholecystitis S/p cholecystostomy drain placement, leukocytosis improves  Continue IV antibiotics and pain regimen.Prn oxycodone/fentanyl/morphine    # Atrial fibrillation with RVR. Continue anticoagulation with heparin drip Rate is better controled.  BP has improved.  Maintain MAP>65  He remains on Amiodarone gtt.  Cardiology on board   # AKI,  possibly secondary to recent IV contrast use.   Creatinine has improved. # microcytic anemia. Check retic panel.  Monitor hemoglobin levels. # Protein calorie malnutrition, continue nutrition supplements. Thank you for allowing me to participate in the care of this patient.   Rickard Patience, MD, PhD Hematology Oncology 10/04/2022

## 2022-10-04 NOTE — Progress Notes (Signed)
ANTICOAGULATION CONSULT NOTE   Pharmacy Consult for IV heparin Indication: atrial fibrillation  No Known Allergies  Patient Measurements: Height: 6' (182.9 cm) Weight: 85.8 kg (189 lb 2.5 oz) IBW/kg (Calculated) : 77.6 Heparin Dosing Weight: 73.9 kg  Vital Signs: Temp: 98.3 F (36.8 C) (07/21 1235) Temp Source: Axillary (07/21 1235) BP: 93/63 (07/21 1400) Pulse Rate: 82 (07/21 1400)  Labs: Recent Labs    10/02/22 0423 10/02/22 1338 10/03/22 0408 10/03/22 0751 10/04/22 0357 10/04/22 0358 10/04/22 1341  HGB 10.3*  --  11.4*  --  11.2*  --   --   HCT 28.7*  --  31.8*  --  31.1*  --   --   PLT 187  --  206  --  196  --   --   HEPARINUNFRC 0.25*   < >  --  0.32 0.27*  --  0.28*  CREATININE 2.49*  --  2.16*  --  1.99* 2.04*  --    < > = values in this interval not displayed.    Estimated Creatinine Clearance: 34.3 mL/min (A) (by C-G formula based on SCr of 2.04 mg/dL (H)).   Medical History: Past Medical History:  Diagnosis Date   Atrial fibrillation (HCC)    Cancer (HCC)    pancreatic   Diabetes mellitus without complication (HCC)    Hypercholesteremia    Hypertension     Medications:  Eliquis 5 mg BID (last dose 09/24/22)  Assessment: 77 year old male with PMH atrial fibrillation on Eliquis prior to admission. Plan was for cholecystectomy drain placement, but was cancelled due to acute Afib.   7/21 0357  HL 0.27 Subtherapeutic 7/21 1341  HL 0.28 Subtherapeutic   Goal of Therapy:  HL 0.3 - 0.7  Monitor platelets by anticoagulation protocol: Yes   Plan:  - Heparin level is subtherapeutic - will order bolus of 1200 units and increase heparin infusion to 1400 units/hr - Recheck heparin level in 8 hrs - CBC daily while on heparin.    Bettey Costa, PharmD Clinical Pharmacist 10/04/2022 2:11 PM

## 2022-10-04 NOTE — Progress Notes (Signed)
[facility]  ROUNDING NOTE   Subjective:   77 yo male who presented to [facility] ER on 07/13 with acute onset of severe abdominal/chest pain.  He was previously admitted at [facility] from 09/21/22-09/25/22 for abdominal pain along with unintentional weight loss.  During hospitalization at [facility] workup concerning for primary metastatic pancreatic adenocarcinoma.  He underwent ERCP/US with biopsy and stent placed on 09/23/22.  [name] is at bedside.  He currently has an NG tube and is allowed to take and only small amount of clears. Creatinine today is down to 1.99 Urine output of 1300 cc.    Objective:  Vital signs in last 24 hours:  Temp:  [97.7 F (36.5 C)-98.5 F (36.9 C)] 98.1 F (36.7 C) (07/21 0100) Pulse Rate:  [76-150] 90 (07/21 0700) Resp:  [16-28] 18 (07/21 0700) BP: (86-122)/(57-83) 96/57 (07/21 0700) SpO2:  [82 %-100 %] 96 % (07/21 0700) Weight:  [85.8 kg] 85.8 kg (07/21 0500)  Weight change: 2.8 kg Filed Weights   10/02/22 0420 10/03/22 0500 10/04/22 0500  Weight: 81.9 kg 83 kg 85.8 kg    Intake/Output: I/O last 3 completed shifts: In: 3218.1 [P.O.:480; I.V.:1529.2; Other:20; NG/GT:40; IV Piggyback:1148.9] Out: 2295 [Urine:2080; Drains:215]   Intake/Output this shift:  No intake/output data recorded.  Physical Exam: General: NAD, cachectic, laying in bed  Head: Normocephalic, atraumatic. Moist oral mucosal membranes  Eyes: Anicteric,  Neck: Supple, trachea midline  Lungs:  [place] O2, normal breathing effort.  Heart: Irregular rhythm  Abdomen:  Soft, +epigastric tenderness, RUQ drain  Extremities:  no peripheral edema.  Neurologic: Alert, able to follow simple commands.  Skin: No lesions  Access: none    Basic Metabolic Panel: Recent Labs  Lab 09/29/22 0949 09/30/22 0334 10/01/22 0448 10/01/22 2000 10/02/22 0423 10/03/22 0406 10/03/22 0408 10/04/22 0357 10/04/22 0358  NA 134* 133* 133*  --  133*  --  132* 128* 129*  K 3.7 3.7  3.0* 3.3* 3.0*  --  2.9* 3.1* 3.2*  CL 97* 98 98  --  100  --  101 99 100  CO2 20* 22 22  --  22  --  20* 18* 20*  GLUCOSE 176* 138* 132*  --  119*  --  144* 133* 133*  BUN 85* 89* 86*  --  76*  --  65* 59* 58*  CREATININE 3.17* 3.05* 2.96*  --  2.49*  --  2.16* 1.99* 2.04*  CALCIUM 7.0* 7.1* 6.9*  --  6.9*  --  7.2* 7.0* 7.1*  MG 2.4 2.5* 2.5*  --  2.4 2.5*  --   --   --   PHOS 8.8* 7.7* 6.7*  --  4.4  --  3.7  --  3.3    Liver Function Tests: Recent Labs  Lab 09/29/22 0949 09/30/22 0334 10/01/22 0448 10/02/22 0423 10/02/22 1044 10/03/22 0408 10/04/22 0358  AST 222* 424*  --   --  844*  --   --   ALT 106* 174*  --   --  310*  --   --   ALKPHOS 192* 277*  --   --  685*  --   --   BILITOT 1.6* 1.6*  --   --  2.5*  --   --   PROT 5.6* 5.2*  --   --  4.7*  --   --   ALBUMIN 2.0* 1.9* 1.7* 2.0* 1.9* 1.9* 1.8*   No results for input(s): "LIPASE", "AMYLASE" in the last 168 hours.  No results for input(s): "AMMONIA" in the last 168 hours.  CBC: Recent Labs  Lab 09/30/22 0334 10/01/22 0448 10/02/22 0423 10/03/22 0408 10/04/22 0357  WBC 17.7* 14.1* 9.0 11.8* 15.1*  HGB 12.4* 12.1* 10.3* 11.4* 11.2*  HCT 34.5* 33.3* 28.7* 31.8* 31.1*  MCV 77.5* 75.9* 77.2* 75.7* 76.6*  PLT 288 273 187 206 196    Cardiac Enzymes: No results for input(s): "CKTOTAL", "CKMB", "CKMBINDEX", "TROPONINI" in the last 168 hours.  BNP: Invalid input(s): "POCBNP"  CBG: Recent Labs  Lab 10/03/22 1705 10/03/22 1935 10/04/22 0001 10/04/22 0345 10/04/22 0745  GLUCAP 160* 140* 138* 133* 143*    Microbiology: Results for orders placed or performed during the hospital encounter of 09/26/22  MRSA Next Gen by PCR, Nasal     Status: None   Collection Time: 09/28/22  4:12 PM   Specimen: Nasal Mucosa; Nasal Swab  Result Value Ref Range Status   MRSA by PCR Next Gen NOT DETECTED NOT DETECTED Final    Comment: (NOTE) The GeneXpert MRSA Assay (FDA approved for NASAL specimens only), is one  component of a comprehensive MRSA colonization surveillance program. It is not intended to diagnose MRSA infection nor to guide or monitor treatment for MRSA infections. Test performance is not FDA approved in patients less than 25 years old. Performed at [facility], [address] [place], [place] [address]   Aerobic/Anaerobic Culture w Gram Stain (surgical/deep wound)     Status: None (Preliminary result)   Collection Time: 09/30/22 12:51 PM   Specimen: Gallbladder; Abscess  Result Value Ref Range Status   Specimen Description   Final    GALL BLADDER Performed at [facility], [address] [place], [place] [address]    Special Requests   Final    NONE Performed at [facility], [address] [place], [place] [address]    Gram Stain   Final    NO WBC SEEN FEW GRAM VARIABLE ROD RARE GRAM POSITIVE COCCI IN PAIRS Performed at [facility], [address], [place], [place] [address]    Culture   Final    RARE CITROBACTER FREUNDII RARE MORGANELLA MORGANII ABUNDANT ENTEROCOCCUS FAECALIS NO ANAEROBES ISOLATED; CULTURE IN PROGRESS FOR 5 DAYS    Report Status PENDING  Incomplete   Organism ID, Bacteria CITROBACTER FREUNDII  Final   Organism ID, Bacteria MORGANELLA MORGANII  Final   Organism ID, Bacteria ENTEROCOCCUS FAECALIS  Final      Susceptibility   Citrobacter freundii - MIC*    CEFEPIME <=0.12 SENSITIVE Sensitive     CEFTAZIDIME <=1 SENSITIVE Sensitive     CEFTRIAXONE <=0.25 SENSITIVE Sensitive     CIPROFLOXACIN <=0.25 SENSITIVE Sensitive     GENTAMICIN <=1 SENSITIVE Sensitive     IMIPENEM <=0.25 SENSITIVE Sensitive     TRIMETH/SULFA <=20 SENSITIVE Sensitive     PIP/TAZO <=4 SENSITIVE Sensitive     * RARE CITROBACTER FREUNDII   Enterococcus faecalis - MIC*    AMPICILLIN <=2 SENSITIVE Sensitive     VANCOMYCIN 2 SENSITIVE Sensitive     GENTAMICIN SYNERGY SENSITIVE Sensitive     * ABUNDANT ENTEROCOCCUS FAECALIS   Morganella  morganii - MIC*    AMPICILLIN >=32 RESISTANT Resistant     CEFTAZIDIME <=1 SENSITIVE Sensitive     CIPROFLOXACIN <=0.25 SENSITIVE Sensitive     GENTAMICIN <=1 SENSITIVE Sensitive     IMIPENEM 4 SENSITIVE Sensitive     TRIMETH/SULFA <=20 SENSITIVE Sensitive     AMPICILLIN/SULBACTAM >=32  RESISTANT Resistant     PIP/TAZO <=4 SENSITIVE Sensitive     * RARE MORGANELLA MORGANII    Coagulation Studies: No results for input(s): "LABPROT", "INR" in the last 72 hours.   Urinalysis: No results for input(s): "COLORURINE", "LABSPEC", "PHURINE", "GLUCOSEU", "HGBUR", "BILIRUBINUR", "KETONESUR", "PROTEINUR", "UROBILINOGEN", "NITRITE", "LEUKOCYTESUR" in the last 72 hours.  Invalid input(s): "APPERANCEUR"     Imaging: No results found.   Medications:    sodium chloride Stopped (10/04/22 0514)   albumin human 60 mL/hr at 10/04/22 0000   amiodarone 30 mg/hr (10/04/22 0700)   heparin 1,250 Units/hr (10/04/22 0700)   piperacillin-tazobactam (ZOSYN)  IV 12.5 mL/hr at 10/04/22 0700   promethazine (PHENERGAN) injection (IM or IVPB)      atorvastatin  40 mg Oral q morning   Chlorhexidine Gluconate Cloth  6 each Topical QHS   docusate sodium  100 mg Oral BID   feeding supplement  237 mL Oral TID BM   insulin aspart  0-9 Units Subcutaneous Q4H   midodrine  10 mg Oral TID WC   multivitamin with minerals  1 tablet Oral Daily   sodium chloride flush  5 mL Intracatheter Q8H   fentaNYL (SUBLIMAZE) injection, iohexol, levalbuterol, metoprolol tartrate, ondansetron **OR** ondansetron (ZOFRAN) IV, oxyCODONE, polyethylene glycol, promethazine (PHENERGAN) injection (IM or IVPB), traMADol, traZODone  Assessment/ Plan:  [name] is a 77 y.o.  male  with atrial fibrillation, pancreatic cancer, diabetes mellitus type II, hyperlipidemia, hypertension, who was admitted to [facility] on 09/26/2022 for Generalized abdominal pain [R10.84] Adenocarcinoma of pancreas (HCC) [C25.9] Primary pancreatic cancer with  metastasis to other site [facility]) [C25.9]  Nephrology consulted for acute kidney injury.    Acute Kidney Injury: baseline creatinine of 0.97 with GFR >60 on admission, 7/13. VI contrast exposure on 7/13. Suspect IV contrast induced nephropathy plus prerenal azotemia. - creatinine slowly improving. Nonoliguric urine output.  - holding home dose of lisinopril -Continue to monitor. -Hypotension managed with midodrine   A-fib.  RVR causing hypotension and hemodynamic in the debility.  Cardiology team on board. Acalculous emphysematous cholecystitis-status post decompression.  Patient now with JP drain. 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 Diabetes type 2-hemoglobin A1c 7.0% from 09/26/2022. Hypokalemia-pharmacist assisting in replacement.  Will order magnesium level for tomorrow morning. Hyponatremia-related to poor oral intake and AKI.   LOS: 7 Kyrin Garn 7/21/20248:45 AM

## 2022-10-04 NOTE — Progress Notes (Signed)
Progress Note   Patient: Cameron Gutierrez:[id number] DOB: 09-Oct-1945 DOA: 09/26/2022     7 DOS: the patient was seen and examined on 10/04/2022   Brief hospital course:   77 yo male who presented to [facility] ER on 07/13 with acute onset of severe abdominal/chest pain.  He was previously admitted at [facility] from 09/21/22-09/25/22 for abdominal pain along with unintentional weight loss.  During hospitalization at [facility] workup concerning for primary metastatic pancreatic adenocarcinoma.  He underwent ERCP/US with biopsy and stent placed on 09/23/22.  His pain was controlled post procedure, and pt discharged home on 09/25/22 (prescribed oxycodone and eliquis resumed).   ED Course Upon arrival to [facility] ER significant lab results were: Na+ 133/K+ 3.4/glucose 148/calcium 7.4/albumin 2.2/wbc 15.7.  CT Abd Pelvis concerning for moderate distension of the gallbladder but no findings of acute cholecystitis, however pt continued to c/o persistent abdominal pain.  EDP consulted with [facility] Oncologist Dr. Everardo All, and following review of imaging did not feel pt required transfer to [facility].  Pt subsequently admitted to [facility] medsurg unit per hospitalist team for additional workup and treatment due to continued abdominal pain.   CT Abd/Pelvis W Contrast:  Large pancreatic head mass compatible with the patient's history of pancreatic cancer. The mass extends cephalad and involves the caudate lobe. There is also a subcapsular fluid collection or implant cephalad to the caudate lobe causing mass effect on the adjacent IVC. Multiple liver lesions, abdominal lymphadenopathy and multiple pulmonary nodules. Findings compatible with metastatic disease. Metallic biliary stent in the common bile duct and there is a small amount of pneumobilia. No significant intrahepatic biliary dilatation. In addition, there is moderate distention of the gallbladder containing iodinated contrast and small amount of gas. Findings compatible with recent  ERCP. Gallbladder distension could be a source of abdominal pain. Bilateral small pleural effusions with compressive atelectasis in the lower lobes. Small amount of free fluid in the abdomen and pelvis. Colonic diverticulosis without evidence for acute diverticulitis. Aortic Atherosclerosis (ICD10-I70.0). 9 mm splenic artery aneurysm.   CXR:  Low lung volumes with trace bilateral pleural effusions and bibasilar opacities favored to reflect areas of subsegmental atelectasis. Aortic atherosclerosis.  Significant Hospital events :   07/13: Pt admitted to the [facility] unit with diffuse abdominal pain following recent ERCP with stent placement and newly diagnosed primary pancreatic adenocarcinoma with metastasis  07/13: [place] Abd Limited RUQ revealed Gallbladder sludge with mild gallbladder    wall thickening and small volume pericholecystic fluid. Positive sonographic Murphy sign. Findings are concerning for acute acalculous cholecystitis. Liver metastases seen on CT are not well identified by ultrasound.  07/14: General surgery consulted for acute acalculous cholecystitis and recommended decompression of the gallbladder via percutaneous cholecystostomy per IR and continue abx therapy.   07/15: Pt arrived to special recovery for cholecystostomy drain placement and pt noted to be in atrial fibrillation with rvr heart rate 140 to 170's.  Cardiology notified pt received 5 mg of iv cardizem x1 dose/250 ml NS bolus/20 mg of iv lasix.  Procedure canceled and pt transferred to the stepdown unit for cardizem gtt.  PCCM team consulted to assist with management.    09/29/22 patient in no distress, met with family today.  Awaiting percutaneous cholecystostomy and thoracentesis.  Testing for TLS today.  09/30/22 patient with emphysematous cholecystitis and pneumoperitoneum.  S/p throacentesis with partial clinical improvement.  Plan for IR consultation and cholecystostomy tube.    10/01/22 patient remains weak and  chronically ill appearing.  He  is s/p GB drain and plan today to remove NGT and start PO nourishment.  He is being seen by palliative cancer specialist.  10/02/22 patient had BM overnight no longer with bowel obstruction does have NG still. Remains in AF on heparin, but vitals improved with low dose neo now. Palliative oncology following.  Patient wishes to have cancer therapy if possible. WBC count is normalized today.  10/03/22 : As patient was off pressors care was transitioned to hospitalist service.  No overnight events as per nursing.  Blood pressure remained stable off pressors with few soft readings.  Improvement in renal functions.  Nephrology following .patient tolerating full liquid diet.  NG can come out tomorrow.  10/04/2022: The patient was seen and examined at his bedside.  His wife was present in the room.  Passed a swallow evaluation.  Okay to do soft diet.  Will repeat LFTs and lipase level.  Will closely monitor to ensure tolerance to soft diet.  Assessment and Plan:  #Acute hypoxic respiratory failure secondary secondary to bilateral pleural effusions and atelectasis  - Supplemental O2 for dyspnea and/or hypoxia  - Maintain O2 sats 92% or higher  - Prn bronchodilator therapy  - Aggressive pulmonary hygiene - Intermittent CXR and ABG's  - Consider thoracentesis  -s/p throacentesis- 900cc removed  -atelelctasis noted - IS and metaneb ordered , discussed with RT today  Continue to closely monitor on telemetry.   Acute Kidney Injury -KDIGO 3   - multifactorial including ischemic/metabolic -consider nephrology consultation  Off IV fluid.   #Atrial fibrillation with rvr  #Hypotension secondary to antiarrhythmic medication  - Continuous telemetry monitoring Currently on amiodarone drip and heparin drip. - Cardiology consulted appreciate input: recommending if renal function normal can consider digoxin, however if pt has evidence of renal failure recommend amiodarone gtt due to  cardizem gtt causing hypotension  - Echo done on 09/29/2022 revealed LVEF 65% with no left ventricle regional abnormalities. - Continue heparin gtt: dosing per pharmacy  No longer on IV vasopressors.  Currently on p.o. vasopressor, midodrine 10 mg 3 times daily.  Maintain MAP greater than 65. - Hold outpatient antihypertensives    #Acalculous emphysematous cholecystitis : Status post decompression now with JP drain in place. - Trend WBC and monitor fever curve  - Continue  abx therapy as outlined above  - General surgery consulted: recommended decompression of the gallbladder via percutaneous cholecystostomy per IR and continue abx therapy.  Procedure canceled 07/15 due to pt developing atrial fibrillation with rvr  - Prn oxycodone/fentanyl/morphine for pain management  -zosyn-   Management per IR.  Appreciate specialists assistance.   #Primary metastatic adenocarcinoma of the pancreas  - Oncology consulted appreciate input  Obtain LFTs and lipase level.   #Type I diabetes mellitus  - CBG's ac/hs - SSI   Physical debility PT OT assessment Fall precautions.        Physical Exam: Vitals:   10/04/22 0800 10/04/22 0900 10/04/22 1000 10/04/22 1100  BP: 96/61 98/67 (!) 87/66 104/69  Pulse: 90 92 (!) 102   Resp: (!) 21 18 (!) 21 20  Temp: 97.9 F (36.6 C)     TempSrc: Oral     SpO2: 96% 97% 98%   Weight:      Height:       Physical Exam Constitutional:      General: He is in acute distress.     Appearance: He is not toxic-appearing.  Eyes:     Extraocular Movements: Extraocular movements intact.  Pupils: Pupils are equal, round, and reactive to light.  Neurological:     Mental Status: He is disoriented.   Lungs : Mild rales at bases.  No wheezing noted. Cardiovascular : Regular rate and rhythm no rubs or gallops.  Dependent edema in upper extremities. Abdomen : NG in place, hypoactive bowel sounds. Neuro : Follows command, moving all extremities pupil reactive to  light and accommodation Genitourinary : Indwelling Foley catheter in place.  Data Reviewed:  There are no new results to review at this time.  Family Communication: Wife updated by bedside  Disposition: Status is: Inpatient Remains inpatient appropriate because: Critical medical condition  Planned Discharge Destination:  TBD     Time spent: 34 minutes  Author: Darlin Drop, DO 10/04/2022 12:00 PM  For on call review [email or web address].

## 2022-10-04 NOTE — Plan of Care (Signed)
  Problem: Metabolic: Goal: Ability to maintain appropriate glucose levels will improve Outcome: Progressing   Problem: Nutritional: Goal: Maintenance of adequate nutrition will improve Outcome: Not Progressing Goal: Progress toward achieving an optimal weight will improve Outcome: Not Progressing   Problem: Skin Integrity: Goal: Risk for impaired skin integrity will decrease Outcome: Progressing   Problem: Tissue Perfusion: Goal: Adequacy of tissue perfusion will improve Outcome: Progressing   Problem: Education: Goal: Knowledge of General Education information will improve Description: Including pain rating scale, medication(s)/side effects and non-pharmacologic comfort measures Outcome: Progressing   Problem: Health Behavior/Discharge Planning: Goal: Ability to manage health-related needs will improve Outcome: Not Progressing   Problem: Clinical Measurements: Goal: Ability to maintain clinical measurements within normal limits will improve Outcome: Progressing Goal: Will remain free from infection Outcome: Progressing Goal: Diagnostic test results will improve Outcome: Progressing Goal: Respiratory complications will improve Outcome: Progressing Goal: Cardiovascular complication will be avoided Outcome: Progressing   Problem: Activity: Goal: Risk for activity intolerance will decrease Outcome: Progressing   Problem: Nutrition: Goal: Adequate nutrition will be maintained Outcome: Not Progressing   Problem: Coping: Goal: Level of anxiety will decrease Outcome: Not Progressing   Problem: Elimination: Goal: Will not experience complications related to bowel motility Outcome: Progressing Goal: Will not experience complications related to urinary retention Outcome: Progressing   Problem: Pain Managment: Goal: General experience of comfort will improve Outcome: Progressing   Problem: Safety: Goal: Ability to remain free from injury will improve Outcome:  Progressing   Problem: Skin Integrity: Goal: Risk for impaired skin integrity will decrease Outcome: Not Progressing

## 2022-10-04 NOTE — Progress Notes (Signed)
ANTICOAGULATION CONSULT NOTE   Pharmacy Consult for IV heparin Indication: atrial fibrillation  No Known Allergies  Patient Measurements: Height: 6' (182.9 cm) Weight: 83 kg (182 lb 15.7 oz) IBW/kg (Calculated) : 77.6 Heparin Dosing Weight: 73.9 kg  Vital Signs: Temp: 98.1 F (36.7 C) (07/21 0100) Temp Source: Oral (07/21 0100) BP: 96/71 (07/21 0430) Pulse Rate: 89 (07/21 0430)  Labs: Recent Labs    10/02/22 0423 10/02/22 1338 10/02/22 2302 10/03/22 0408 10/03/22 0751 10/04/22 0357 10/04/22 0358  HGB 10.3*  --   --  11.4*  --  11.2*  --   HCT 28.7*  --   --  31.8*  --  31.1*  --   PLT 187  --   --  206  --  196  --   HEPARINUNFRC 0.25*   < > 0.38  --  0.32 0.27*  --   CREATININE 2.49*  --   --  2.16*  --  1.99* 2.04*   < > = values in this interval not displayed.    Estimated Creatinine Clearance: 34.3 mL/min (A) (by C-G formula based on SCr of 2.04 mg/dL (H)).   Medical History: Past Medical History:  Diagnosis Date   Atrial fibrillation (HCC)    Cancer (HCC)    pancreatic   Diabetes mellitus without complication (HCC)    Hypercholesteremia    Hypertension     Medications:  Eliquis 5 mg BID (last dose 09/24/22)  Assessment: 77 year old male with PMH atrial fibrillation on Eliquis prior to admission. Plan was for cholecystectomy drain placement, but was cancelled due to acute Afib.   7/21 0357  HL 0.27 Subtherapeutic   Goal of Therapy:  HL 0.3 - 0.7  Monitor platelets by anticoagulation protocol: Yes   Plan:  - Heparin level is subtherapeutic - will order bolus of 1200 units and increase heparin infusion to 1250 units/hr - Recheck heparin level in 8 hrs - CBC daily while on heparin.    Angelique Blonder, PharmD Clinical Pharmacist 10/04/2022 5:11 AM

## 2022-10-04 NOTE — Evaluation (Addendum)
Clinical/Bedside Swallow Evaluation Patient Details  Name: Cameron Gutierrez MRN: [id number] Date of Birth: 13-Jan-1946  Today's Date: 10/04/2022 Time: SLP Start Time (ACUTE ONLY): 1110 SLP Stop Time (ACUTE ONLY): 1135 SLP Time Calculation (min) (ACUTE ONLY): 25 min  Past Medical History:  Past Medical History:  Diagnosis Date   Atrial fibrillation (HCC)    Cancer (HCC)    pancreatic   Diabetes mellitus without complication (HCC)    Hypercholesteremia    Hypertension    Past Surgical History:  Past Surgical History:  Procedure Laterality Date   KNEE SURGERY     REPLACEMENT TOTAL KNEE Right    SHOULDER SURGERY     HPI:  Per H&P "Cameron Gutierrez is a 77 y.o. male with medical history significant of adult onset type I diabetes follows with [facility] endocrinology, hypertension, CKD stage II, hyperlipidemia, a fib on eliquis presents to the emergency room with abdominal pain which started early hours of this morning at 4 o'clock.     Patient was recently admitted at [facility] from 09/21/22--09/25/22 when he went there for abdominal pain and unintentional weight loss and workup showed patient has primary metastatic pancreatic adenocarcinoma.     Patient underwent ERCP/US with biopsy and stent placement on July 11 at [facility]. He was discharge yesterday with oral oxycodone. Patient was tolerating some diet. Came in after he started having severe abdominal pain started around 4 o'clock. Denies any vomiting.     In the ER patient received 4 mg of IV morphine. He is tachycardic. Blood pressure otherwise stable. Wife is at bedside.     Patient has follow-up appointment with surgical oncology at [facility] on July 22. I discussed with patient and wife if they would want oncology to follow here they said they would want to keep that appointment at [facility] for now.     pt is being admitted for  pain control.  CT abdomen pelvis, 09/25/21, " 1. Large pancreatic head mass compatible with the patient's history  of pancreatic  cancer. The mass extends cephalad and involves the  caudate lobe. There is also a subcapsular fluid collection or  implant cephalad to the caudate lobe causing mass effect on the  adjacent IVC.  2. Multiple liver lesions, abdominal lymphadenopathy and multiple  pulmonary nodules. Findings compatible with metastatic disease.  3. Metallic biliary stent in the common bile duct and there is a  small amount of pneumobilia. No significant intrahepatic biliary  dilatation. In addition, there is moderate distention of the  gallbladder containing iodinated contrast and small amount of gas.  Findings compatible with recent ERCP. Gallbladder distension could  be a source of abdominal pain.  4. Bilateral small pleural effusions with compressive atelectasis in  the lower lobes.  5. Small amount of free fluid in the abdomen and pelvis.  6. Colonic diverticulosis without evidence for acute diverticulitis.  7. Aortic Atherosclerosis (ICD10-I70.0).  8. 9 mm splenic artery aneurysm."    Assessment / Plan / Recommendation  Clinical Impression  Pt seen for clinical swallowing evaluation. Pt alert, pleasant. Cachectic in appearance. Occasional hypophonia. +NGT. Oral motor examination significant for xerostomia and missing dentition / poor dental status. Pt on room air. Denies dysphagia PTA, but endorsed getting "choked" on a pill earlier this admission. Pt reports early satiety and unintentional weight loss. Pt's wife present for duration of evaluation, additional male family member present at end of session. Pt presents with a mild-moderate oral dysphagia c/b prolonged mastication of solids likely due  to dental status. Pharyngeal swallow appeared [facility] per clinical assessment. After discussion with pt/family, pt wishes to initiate a mech soft diet with thin liquids - declined Dysphagia 2 diet. Recommend safe swallowing strategies as outlined below including meds crushed with puree as able. SLP to f/u per POC for diet tolerance.  Concern for pt's abilty to maintain nutrition orally. RD following.  SLP Visit Diagnosis: Dysphagia, oral phase (R13.11)    Aspiration Risk  Mild aspiration risk;Risk for inadequate nutrition/hydration    Diet Recommendation Dysphagia 3 (Mech soft);Thin liquid (to hopefully promote PO intake)    Medication Administration: Crushed with puree Supervision: Patient able to self feed Compensations: Slow rate;Small sips/bites Postural Changes: Seated upright at 90 degrees (upright at least 60 minutes after POs)    Other  Recommendations      Recommendations for follow up therapy are one component of a multi-disciplinary discharge planning process, led by the attending physician.  Recommendations may be updated based on patient status, additional functional criteria and insurance authorization.  Follow up Recommendations No SLP follow up      Assistance Recommended at Discharge    Functional Status Assessment Patient has had a recent decline in their functional status and/or demonstrates limited ability to make significant improvements in function in a reasonable and predictable amount of time  Frequency and Duration min 1 x/week  1 week       Prognosis Prognosis for improved oropharyngeal function: Fair (dental status; overall medical prognosis)      Swallow Study   General Date of Onset: 09/26/22 (admit date) HPI: Per H&P "Cameron Gutierrez is a 77 y.o. male with medical history significant of adult onset type I diabetes follows with [facility] endocrinology, hypertension, CKD stage II, hyperlipidemia, a fib on eliquis presents to the emergency room with abdominal pain which started early hours of this morning at 4 o'clock.     Patient was recently admitted at [facility] from 09/21/22--09/25/22 when he went there for abdominal pain and unintentional weight loss and workup showed patient has primary metastatic pancreatic adenocarcinoma.     Patient underwent ERCP/US with biopsy and stent  placement on July 11 at [facility]. He was discharge yesterday with oral oxycodone. Patient was tolerating some diet. Came in after he started having severe abdominal pain started around 4 o'clock. Denies any vomiting.     In the ER patient received 4 mg of IV morphine. He is tachycardic. Blood pressure otherwise stable. Wife is at bedside.     Patient has follow-up appointment with surgical oncology at [facility] on July 22. I discussed with patient and wife if they would want oncology to follow here they said they would want to keep that appointment at [facility] for now.     pt is being admitted for  pain control.  CT abdomen pelvis, 09/25/21, " 1. Large pancreatic head mass compatible with the patient's history  of pancreatic cancer. The mass extends cephalad and involves the  caudate lobe. There is also a subcapsular fluid collection or  implant cephalad to the caudate lobe causing mass effect on the  adjacent IVC.  2. Multiple liver lesions, abdominal lymphadenopathy and multiple  pulmonary nodules. Findings compatible with metastatic disease.  3. Metallic biliary stent in the common bile duct and there is a  small amount of pneumobilia. No significant intrahepatic biliary  dilatation. In addition, there is moderate distention of the  gallbladder containing iodinated contrast and small amount of gas.  Findings compatible with recent  ERCP. Gallbladder distension could  be a source of abdominal pain.  4. Bilateral small pleural effusions with compressive atelectasis in  the lower lobes.  5. Small amount of free fluid in the abdomen and pelvis.  6. Colonic diverticulosis without evidence for acute diverticulitis.  7. Aortic Atherosclerosis (ICD10-I70.0).  8. 9 mm splenic artery aneurysm." Type of Study: Bedside Swallow Evaluation Previous Swallow Assessment: none Diet Prior to this Study: Full liquid diet Temperature Spikes Noted: No Respiratory Status: Room air History of Recent Intubation: No Behavior/Cognition:  Alert;Cooperative Oral Cavity Assessment: Dry Oral Care Completed by SLP: Yes Oral Cavity - Dentition: Poor condition;Missing dentition Vision: Functional for self-feeding Self-Feeding Abilities: Able to feed self Patient Positioning: Upright in bed Baseline Vocal Quality: Low vocal intensity Volitional Cough: Strong Volitional Swallow: Able to elicit    Oral/Motor/Sensory Function WFL  Ice Chips Ice chips: Within functional limits   Thin Liquid Thin Liquid: Within functional limits Presentation: Cup;Straw    Nectar Thick Nectar Thick Liquid: Not tested   Honey Thick Honey Thick Liquid: Not tested   Puree Puree: Within functional limits Presentation: Spoon   Solid     Solid: Impaired Presentation: Self Fed Oral Phase Impairments: Impaired mastication Oral Phase Functional Implications: Impaired mastication Pharyngeal Phase Impairments:  (WFL)     Clyde Canterbury, M.S., CCC-SLP Speech-Language Pathologist [facility] [phone] (ASCOM)  Alessandra Bevels Eryk Beavers 10/04/2022,12:58 PM

## 2022-10-04 NOTE — Evaluation (Signed)
Physical Therapy Evaluation Patient Details Name: Cameron Gutierrez MRN: [id number] DOB: 11/28/1945 Today's Date: 10/04/2022  History of Present Illness  77 yo male who presented to [facility] ER on 07/13 with acute onset of severe abdominal/chest pain.  He was previously admitted at [facility] from 09/21/22-09/25/22 for abdominal pain along with unintentional weight loss.  During hospitalization at [facility] workup concerning for primary metastatic pancreatic adenocarcinoma.  He underwent ERCP/US with biopsy and stent placed on 09/23/22.  His pain was controlled post procedure, and pt discharged home on 09/25/22  Clinical Impression  Pt reports feeling very weak having laid in bed ~1 week, eager to get up and see what he could do but knowing he is far from his baseline.  He needed assist with bed mobility, extra O2 (mid 90s on room air on arrival, low 90s/high80s sitting EOB, donned 2L during activity per discussion with RN).  Pt was ultimately able to rise to standing with minA from elevated surface and ambulated ~25 ft with slow, guarded effort relying heavily on the walker.  He was very active and independent before recent hospitalizations and is highly motivated to go home, though at this point acknowledges that he is far too weak to safely do so at this point.  Pt will benefit from continued PT to address functional limitations and work back to PLOF.       Assistance Recommended at Discharge Intermittent Supervision/Assistance  If plan is discharge home, recommend the following:  Can travel by private vehicle  A little help with walking and/or transfers;A little help with bathing/dressing/bathroom;Assistance with cooking/housework;Assist for transportation;Help with stairs or ramp for entrance   No    Equipment Recommendations  (TBD at next venue of care)  Recommendations for Other Services       Functional Status Assessment Patient has had a recent decline in their functional status and  demonstrates the ability to make significant improvements in function in a reasonable and predictable amount of time.     Precautions / Restrictions Precautions Precautions: Fall Restrictions Weight Bearing Restrictions: No      Mobility  Bed Mobility Overal bed mobility: Needs Assistance Bed Mobility: Supine to Sit     Supine to sit: Min assist, Mod assist     General bed mobility comments: Pt showed good effort in reaching for rails and trying to get LEs toward EOB, ultimately needed assist with both trunk and LEs    Transfers Overall transfer level: Needs assistance Equipment used: Rolling walker (2 wheels) Transfers: Sit to/from Stand Sit to Stand: From elevated surface, Min assist           General transfer comment: Initial attempt to stand w/o assist was close but unable to attain upright, minA and plenty of cuing for set up/encouragement on second attempt - heavy use of UEs to rise and walker once upright    Ambulation/Gait Ambulation/Gait assistance: Min assist, +2 safety/equipment Gait Distance (Feet): 25 Feet Assistive device: Rolling walker (2 wheels)         General Gait Details: Pt highly motivated but guarded and heavily UE reliant during ambulation to the hallway.  His HR did in increased to ~120 and O2 dropped to high 80s on 2L.  He endorses significant fatigue but also very happy to have gotten out of bed and done as much as he managed.  Chair follow for safety  Stairs            Wheelchair Mobility     Tilt Bed  Modified Rankin (Stroke Patients Only)       Balance Overall balance assessment: Needs assistance Sitting-balance support: Single extremity supported Sitting balance-Leahy Scale: Good     Standing balance support: Bilateral upper extremity supported Standing balance-Leahy Scale: Fair Standing balance comment: highly reliant on walker, no overt buckling but LEs clearly fatiguing with increased standing time/ambulation  distanc                             Pertinent Vitals/Pain Pain Assessment Pain Assessment: 0-10 Pain Score: 4  Pain Location: general abdominal and soreness from laying in bed pain    Home Living Family/patient expects to be discharged to:: Private residence Living Arrangements: Spouse/significant other Available Help at Discharge: Family;Available 24 hours/day (grand daughter lives close, son ~30 minutes away)   Home Access: Stairs to enter Entrance Stairs-Rails: None Entrance Stairs-Number of Steps: 1   Home Layout: One level Home Equipment: Agricultural consultant (2 wheels);Cane - single point      Prior Function Prior Level of Function : Working/employed;Independent/Modified Independent             Mobility Comments: drives, still installing fences, 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 ADLs Comments: independent     Hand Dominance        Extremity/Trunk Assessment   Upper Extremity Assessment Upper Extremity Assessment: Generalized weakness;Overall WFL for tasks assessed (much weaker than baseline but still with functional strenght given PLOF)    Lower Extremity Assessment Lower Extremity Assessment: Generalized weakness (more proximal weakness)       Communication   Communication: No difficulties  Cognition Arousal/Alertness: Awake/alert Behavior During Therapy: WFL for tasks assessed/performed Overall Cognitive Status: Within Functional Limits for tasks assessed                                          General Comments General comments (skin integrity, edema, etc.): Pt is very active at baseline, eager to get up and do something - showed great effort but is very weak and far from baseline    Exercises     Assessment/Plan    PT Assessment Patient needs continued PT services  PT Problem List Decreased strength;Decreased range of motion;Decreased activity tolerance;Decreased balance;Decreased mobility;Decreased knowledge of use of DME;Decreased  safety awareness       PT Treatment Interventions DME instruction;Gait training;Functional mobility training;Therapeutic activities;Therapeutic exercise;Balance training;Patient/family education    PT Goals (Current goals can be found in the Care Plan section)  Acute Rehab PT Goals Patient Stated Goal: very much wants to work hard, get strength back and go home, but open to rehab PT Goal Formulation: With patient Time For Goal Achievement: 10/17/22 Potential to Achieve Goals: Fair    Frequency Min 1X/week     Co-evaluation               AM-PAC PT "6 Clicks" Mobility  Outcome Measure Help needed turning from your back to your side while in a flat bed without using bedrails?: A Little Help needed moving from lying on your back to sitting on the side of a flat bed without using bedrails?: A Lot Help needed moving to and from a bed to a chair (including a wheelchair)?: A Little Help needed standing up from a chair using your arms (e.g., wheelchair or bedside chair)?: A Lot Help needed to walk in hospital room?: A Little  Help needed climbing 3-5 steps with a railing? : Total 6 Click Score: 14    End of Session Equipment Utilized During Treatment: Gait belt;Oxygen (2L) Activity Tolerance: Patient limited by fatigue Patient left: in chair;with call bell/phone within reach;with family/visitor present Nurse Communication: Mobility status PT Visit Diagnosis: Muscle weakness (generalized) (M62.81);Difficulty in walking, not elsewhere classified (R26.2)    Time: 5643-3295 PT Time Calculation (min) (ACUTE ONLY): 54 min   Charges:   PT Evaluation $PT Eval Moderate Complexity: 1 Mod PT Treatments $Gait Training: 8-22 mins $Therapeutic Activity: 8-22 mins PT General Charges $$ ACUTE PT VISIT: 1 Visit         Malachi Pro, DPT 10/04/2022, 4:23 PM

## 2022-10-05 ENCOUNTER — Inpatient Hospital Stay: Payer: Medicare Other

## 2022-10-05 DIAGNOSIS — I4891 Unspecified atrial fibrillation: Secondary | ICD-10-CM | POA: Diagnosis not present

## 2022-10-05 DIAGNOSIS — C259 Malignant neoplasm of pancreas, unspecified: Secondary | ICD-10-CM | POA: Diagnosis not present

## 2022-10-05 DIAGNOSIS — E1122 Type 2 diabetes mellitus with diabetic chronic kidney disease: Secondary | ICD-10-CM | POA: Diagnosis not present

## 2022-10-05 DIAGNOSIS — K810 Acute cholecystitis: Secondary | ICD-10-CM | POA: Diagnosis not present

## 2022-10-05 LAB — COMPREHENSIVE METABOLIC PANEL
ALT: 198 U/L — ABNORMAL HIGH (ref 0–44)
AST: 284 U/L — ABNORMAL HIGH (ref 15–41)
Albumin: 2 g/dL — ABNORMAL LOW (ref 3.5–5.0)
Alkaline Phosphatase: 631 U/L — ABNORMAL HIGH (ref 38–126)
Anion gap: 11 (ref 5–15)
BUN: 43 mg/dL — ABNORMAL HIGH (ref 8–23)
CO2: 20 mmol/L — ABNORMAL LOW (ref 22–32)
Calcium: 7.4 mg/dL — ABNORMAL LOW (ref 8.9–10.3)
Chloride: 99 mmol/L (ref 98–111)
Creatinine, Ser: 1.71 mg/dL — ABNORMAL HIGH (ref 0.61–1.24)
GFR, Estimated: 41 mL/min — ABNORMAL LOW (ref >60)
Glucose, Bld: 120 mg/dL — ABNORMAL HIGH (ref 70–99)
Potassium: 3.4 mmol/L — ABNORMAL LOW (ref 3.5–5.1)
Sodium: 130 mmol/L — ABNORMAL LOW (ref 135–145)
Total Bilirubin: 1.9 mg/dL — ABNORMAL HIGH (ref 0.3–1.2)
Total Protein: 5.2 g/dL — ABNORMAL LOW (ref 6.5–8.1)

## 2022-10-05 LAB — RENAL FUNCTION PANEL
Albumin: 1.9 g/dL — ABNORMAL LOW (ref 3.5–5.0)
Anion gap: 11 (ref 5–15)
BUN: 48 mg/dL — ABNORMAL HIGH (ref 8–23)
CO2: 19 mmol/L — ABNORMAL LOW (ref 22–32)
Calcium: 7.3 mg/dL — ABNORMAL LOW (ref 8.9–10.3)
Chloride: 99 mmol/L (ref 98–111)
Creatinine, Ser: 1.65 mg/dL — ABNORMAL HIGH (ref 0.61–1.24)
GFR, Estimated: 43 mL/min — ABNORMAL LOW (ref >60)
Glucose, Bld: 119 mg/dL — ABNORMAL HIGH (ref 70–99)
Phosphorus: 3 mg/dL (ref 2.5–4.6)
Potassium: 3.3 mmol/L — ABNORMAL LOW (ref 3.5–5.1)
Sodium: 129 mmol/L — ABNORMAL LOW (ref 135–145)

## 2022-10-05 LAB — CBC
HCT: 31.5 % — ABNORMAL LOW (ref 39.0–52.0)
Hemoglobin: 11.1 g/dL — ABNORMAL LOW (ref 13.0–17.0)
MCH: 27.7 pg (ref 26.0–34.0)
MCHC: 35.2 g/dL (ref 30.0–36.0)
MCV: 78.6 fL — ABNORMAL LOW (ref 80.0–100.0)
Platelets: 201 10*3/uL (ref 150–400)
RBC: 4.01 MIL/uL — ABNORMAL LOW (ref 4.22–5.81)
RDW: 14.3 % (ref 11.5–15.5)
WBC: 17.7 10*3/uL — ABNORMAL HIGH (ref 4.0–10.5)
nRBC: 0 % (ref 0.0–0.2)

## 2022-10-05 LAB — MAGNESIUM: Magnesium: 2.4 mg/dL (ref 1.7–2.4)

## 2022-10-05 LAB — AEROBIC/ANAEROBIC CULTURE W GRAM STAIN (SURGICAL/DEEP WOUND): Gram Stain: NONE SEEN

## 2022-10-05 LAB — GLUCOSE, CAPILLARY
Glucose-Capillary: 123 mg/dL — ABNORMAL HIGH (ref 70–99)
Glucose-Capillary: 123 mg/dL — ABNORMAL HIGH (ref 70–99)
Glucose-Capillary: 121 mg/dL — ABNORMAL HIGH (ref 70–99)
Glucose-Capillary: 145 mg/dL — ABNORMAL HIGH (ref 70–99)
Glucose-Capillary: 147 mg/dL — ABNORMAL HIGH (ref 70–99)
Glucose-Capillary: 171 mg/dL — ABNORMAL HIGH (ref 70–99)

## 2022-10-05 LAB — HEPARIN LEVEL (UNFRACTIONATED)
Heparin Unfractionated: 0.42 IU/mL (ref 0.30–0.70)
Heparin Unfractionated: 0.45 IU/mL (ref 0.30–0.70)

## 2022-10-05 LAB — CULTURE, BLOOD (ROUTINE X 2): Culture: NO GROWTH

## 2022-10-05 MED ORDER — IOHEXOL 9 MG/ML PO SOLN
500.0000 mL | ORAL | Status: AC
  Administered 2022-10-05: 500 mL via ORAL

## 2022-10-05 MED ORDER — POTASSIUM CHLORIDE CRYS ER 20 MEQ PO TBCR
40.0000 meq | EXTENDED_RELEASE_TABLET | Freq: Once | ORAL | Status: AC
  Administered 2022-10-05: 40 meq via ORAL
  Filled 2022-10-05: qty 2

## 2022-10-05 NOTE — Consult Note (Signed)
NAME: Cameron Gutierrez  DOB: 01/12/1946  MRN: [id number]  Date/Time: 10/05/2022 12:37 PM  REQUESTING PROVIDER; Dr.Hall Subjective:  REASON FOR CONSULT: leucocytosis/acute cholecystitis ? Cameron Gutierrez is a 77 y.o. with a history of LADA, HTN, CKD, AFIB, HLD, RT TKA , metastatic pancreatic ca was  admitted with abdominal pain on 09/26/22 Pt was recently in [facility] between 7/8-7/11/24 with epigastric pain and weight loss and found to have pancreatic head mass and obstruction of the bile duct. 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 He was discharged home to follow up with oncology. He came to [facility] on 7/13 with worsening abdominal pain  09/26/22 05:21  BP 160/94 (H)  Temp 97.5 F (36.4 C) !  Pulse Rate 82  Resp 22 !  SpO2 95 %    Latest Reference Range & Units 09/26/22 05:38  WBC 4.0 - 10.5 K/uL 15.7 (H)  Hemoglobin 13.0 - 17.0 g/dL 95.2  HCT 84.1 - 32.4 % 41.3  Platelets 150 - 400 K/uL 337   CT abdomen revealed perihepatic subcapsular collection , distended gal bladder, hepatic mets, and pancreatic mass encircling SMV On 7/17 he underwent catheter placement in the subcapsular collection and then a percutaneous cholecystostomy- purulent fluid was sent for culture and it had citrobacter, morganella and enterococcus Pt was initially on unasyn and now changed to zosyn I am asked to see the patient as leucocytosis which was present on admission had normalized and now starting to increase- pt is doing better though He worked with PT and walked in the corridor says his abdominal pain is better He had CT abdomen today and it showed b/l mild pleural effusion and possible atelectasis Along with other changes reported before in the pancreas and liver  Past Medical History:  Diagnosis Date   Atrial fibrillation (HCC)    Cancer (HCC)     pancreatic   Diabetes mellitus without complication (HCC)    Hypercholesteremia    Hypertension     Past Surgical History:  Procedure Laterality Date   KNEE SURGERY     REPLACEMENT TOTAL KNEE Right    SHOULDER SURGERY      Social History   Socioeconomic History   Marital status: Married    Spouse name: Not on file   Number of children: Not on file   Years of education: Not on file   Highest education level: Not on file  Occupational History   Not on file  Tobacco Use   Smoking status: Never   Smokeless tobacco: Never  Substance and Sexual Activity   Alcohol use: Not Currently   Drug use: Never   Sexual activity: Not on file  Other Topics Concern   Not on file  Social History Narrative   ** Merged History Encounter **       Social Determinants of Health   Financial Resource Strain: Low Risk  (09/21/2022)   Received from [facility]   Overall Financial Resource Strain (CARDIA)    Difficulty of Paying Living Expenses: Not very hard  Food Insecurity: No Food Insecurity (09/26/2022)   Hunger Vital Sign    Worried About Running Out of Food in the Last Year: Never true    Ran Out of Food in the Last Year: Never true  Transportation Needs: No Transportation Needs (09/26/2022)   PRAPARE - Transportation  Lack of Transportation (Medical): No    Lack of Transportation (Non-Medical): No  Physical Activity: Not on file  Stress: No Stress Concern Present (09/21/2022)   Received from [facility] of Occupational Health - Occupational Stress Questionnaire    Feeling of Stress : Not at all  Social Connections: Not on file  Intimate Partner Violence: Not At Risk (09/26/2022)   Humiliation, Afraid, Rape, and Kick questionnaire    Fear of Current or Ex-Partner: No    Emotionally Abused: No    Physically Abused: No    Sexually Abused: No    History reviewed. No pertinent family history. No Known Allergies I? Current Facility-Administered Medications   Medication Dose Route Frequency Provider Last Rate Last Admin   0.9 %  sodium chloride infusion  250 mL Intravenous Continuous Ezequiel Essex, NP 5 mL/hr at 10/05/22 1100 Infusion Verify at 10/05/22 1100   amiodarone (NEXTERONE PREMIX) 360-4.14 MG/200ML-% (1.8 mg/mL) IV infusion  30 mg/hr Intravenous Continuous Enedina Finner, MD 16.67 mL/hr at 10/05/22 1100 30 mg/hr at 10/05/22 1100   atorvastatin (LIPITOR) tablet 40 mg  40 mg Oral q morning Enedina Finner, MD   40 mg at 10/05/22 0919   Chlorhexidine Gluconate Cloth 2 % PADS 6 each  6 each Topical QHS Enedina Finner, MD   6 each at 10/05/22 [phone]   docusate sodium (COLACE) capsule 100 mg  100 mg Oral BID Enedina Finner, MD   100 mg at 10/02/22 0957   feeding supplement (ENSURE ENLIVE / ENSURE PLUS) liquid 237 mL  237 mL Oral TID BM Hall, Carole N, DO   237 mL at 10/04/22 1354   fentaNYL (SUBLIMAZE) injection 50 mcg  50 mcg Intravenous Q4H PRN Enedina Finner, MD   50 mcg at 10/05/22 0915   heparin ADULT infusion 100 units/mL (25000 units/238mL)  1,400 Units/hr Intravenous Continuous Mila Merry A, RPH 14 mL/hr at 10/05/22 1100 1,400 Units/hr at 10/05/22 1100   insulin aspart (novoLOG) injection 0-9 Units  0-9 Units Subcutaneous Q4H Rust-Chester, Britton L, NP   1 Units at 10/05/22 0331   iohexol (OMNIPAQUE) 9 MG/ML oral solution 500 mL  500 mL Oral Once PRN Ezequiel Essex, NP   500 mL at 09/29/22 1616   iohexol (OMNIPAQUE) 9 MG/ML oral solution 500 mL  500 mL Oral Q1H Arnetha Courser, MD       levalbuterol (XOPENEX) nebulizer solution 0.63 mg  0.63 mg Nebulization Q6H PRN Ezequiel Essex, NP       metoprolol tartrate (LOPRESSOR) injection 5 mg  5 mg Intravenous Q4H PRN Enedina Finner, MD   5 mg at 09/28/22 1456   midodrine (PROAMATINE) tablet 10 mg  10 mg Oral TID WC Harlon Ditty D, NP   10 mg at 10/05/22 1191   multivitamin with minerals tablet 1 tablet  1 tablet Oral Daily Enedina Finner, MD   1 tablet at 10/05/22 0919   ondansetron (ZOFRAN) tablet 4 mg  4 mg Oral  Q6H PRN Enedina Finner, MD       Or   ondansetron [facility]) injection 4 mg  4 mg Intravenous Q6H PRN Enedina Finner, MD   4 mg at 09/28/22 1638   oxyCODONE (Oxy IR/ROXICODONE) immediate release tablet 10 mg  10 mg Oral Q4H PRN Enedina Finner, MD   10 mg at 09/29/22 2215   piperacillin-tazobactam (ZOSYN) IVPB 3.375 g  3.375 g Intravenous Q8H Rust-Chester, Cecelia Byars, NP   Stopped at 10/05/22 [phone]  polyethylene glycol (MIRALAX / GLYCOLAX) packet 17 g  17 g Oral Daily PRN Enedina Finner, MD       potassium chloride SA (KLOR-CON M) CR tablet 40 mEq  40 mEq Oral Once Jaynie Bream, RPH       promethazine (PHENERGAN) 12.5 mg in sodium chloride 0.9 % 50 mL IVPB  12.5 mg Intravenous Q6H PRN Enedina Finner, MD       sodium chloride flush (NS) 0.9 % injection 5 mL  5 mL Intracatheter Q8H Suttle, Thressa Sheller, MD   5 mL at 10/05/22 0510   traMADol (ULTRAM) tablet 50 mg  50 mg Oral Q6H PRN Enedina Finner, MD   50 mg at 09/27/22 2252   traZODone (DESYREL) tablet 25 mg  25 mg Oral QHS PRN Enedina Finner, MD   25 mg at 10/04/22 2143     Abtx:  Anti-infectives (From admission, onward)    Start     Dose/Rate Route Frequency Ordered Stop   09/30/22 0000  piperacillin-tazobactam (ZOSYN) IVPB 3.375 g        3.375 g 12.5 mL/hr over 240 Minutes Intravenous Every 8 hours 09/29/22 2307     09/29/22 1800  Ampicillin-Sulbactam (UNASYN) 3 g in sodium chloride 0.9 % 100 mL IVPB  Status:  Discontinued        3 g 200 mL/hr over 30 Minutes Intravenous Every 8 hours 09/29/22 1425 09/29/22 2307   09/27/22 1000  Ampicillin-Sulbactam (UNASYN) 3 g in sodium chloride 0.9 % 100 mL IVPB  Status:  Discontinued        3 g 200 mL/hr over 30 Minutes Intravenous Every 6 hours 09/27/22 0844 09/29/22 1425       REVIEW OF SYSTEMS:  Const: negative fever, negative chills, ++ weight loss Eyes: negative diplopia or visual changes, negative eye pain ENT: negative coryza, negative sore throat Resp: negative cough, hemoptysis, dyspnea Cards: negative for  chest pain, palpitations, lower extremity edema GU: negative for frequency, dysuria and hematuria GI:  abdominal pain better, diarrhea, bleeding, constipation Skin: negative for rash and pruritus Heme: negative for easy bruising and gum/nose bleeding MS:  muscle weakness Neurolo:negative for headaches, dizziness, vertigo, memory problems  Psych: negative for feelings of anxiety, depression  Endocrine:  diabetes Allergy/Immunology- as above Objective:  VITALS:  BP 99/72   Pulse 98   Temp 98.2 F (36.8 C) (Oral)   Resp (!) 21   Ht 6' (1.829 m)   Wt 85.8 kg   SpO2 93%   BMI 25.65 kg/m  LDA 2 rt upper quadrant drains  PHYSICAL EXAM:  General: Alert, cooperative, no distress, emaciated.  Head: Normocephalic, without obvious abnormality, atraumatic. Eyes: Conjunctivae clear, anicteric sclerae. Pupils are equal ENT Nares normal. No drainage or sinus tenderness. Lips, mucosa, and tongue normal. No Thrush Neck: Supple, symmetrical, no adenopathy, thyroid: non tender no carotid bruit and no JVD. Back: No CVA tenderness. Lungs: Clear to auscultation bilaterally. No Wheezing or Rhonchi. No rales. Heart: Regular rate and rhythm, no murmur, rub or gallop. Abdomen: Soft, non-tender,not distended. Bowel sounds normal. No masses Extremities: atraumatic, no cyanosis. No edema. No clubbing Skin: No rashes or lesions. Or bruising Lymph: Cervical, supraclavicular normal. Neurologic: Grossly non-focal Pertinent Labs Lab Results CBC    Component Value Date/Time   WBC 17.7 (H) 10/05/2022 0631   RBC 4.01 (L) 10/05/2022 0631   HGB 11.1 (L) 10/05/2022 0631   HGB 13.3 09/29/2022 1139   HCT 31.5 (L) 10/05/2022 0631   HCT 43.7 09/24/2013 0122  PLT 201 10/05/2022 0631   PLT 170 09/24/2013 0122   MCV 78.6 (L) 10/05/2022 0631   MCV 87 09/24/2013 0122   MCH 27.7 10/05/2022 0631   MCHC 35.2 10/05/2022 0631   RDW 14.3 10/05/2022 0631   RDW 13.2 09/24/2013 0122   LYMPHSABS 1.4 09/24/2013 0122    MONOABS 0.7 09/24/2013 0122   EOSABS 0.3 09/24/2013 0122   BASOSABS 0.0 09/24/2013 0122       Latest Ref Rng & Units 10/05/2022    6:33 AM 10/05/2022    6:31 AM 10/04/2022    3:58 AM  CMP  Glucose 70 - 99 mg/dL 841  324  401   BUN 8 - 23 mg/dL 48  43  58   Creatinine 0.61 - 1.24 mg/dL 0.27  2.53  6.64   Sodium 135 - 145 mmol/L 129  130  129   Potassium 3.5 - 5.1 mmol/L 3.3  3.4  3.2   Chloride 98 - 111 mmol/L 99  99  100   CO2 22 - 32 mmol/L 19  20  20    Calcium 8.9 - 10.3 mg/dL 7.3  7.4  7.1   Total Protein 6.5 - 8.1 g/dL  5.2  4.8   Total Bilirubin 0.3 - 1.2 mg/dL  1.9  1.6   Alkaline Phos 38 - 126 U/L  631  767   AST 15 - 41 U/L  284  607   ALT 0 - 44 U/L  198  288       Microbiology: Recent Results (from the past 240 hour(s))  MRSA Next Gen by PCR, Nasal     Status: None   Collection Time: 09/28/22  4:12 PM   Specimen: Nasal Mucosa; Nasal Swab  Result Value Ref Range Status   MRSA by PCR Next Gen NOT DETECTED NOT DETECTED Final    Comment: (NOTE) The GeneXpert MRSA Assay (FDA approved for NASAL specimens only), is one component of a comprehensive MRSA colonization surveillance program. It is not intended to diagnose MRSA infection nor to guide or monitor treatment for MRSA infections. Test performance is not FDA approved in patients less than 6 years old. Performed at [facility], [address] [place], [place] [address]   Aerobic/Anaerobic Culture w Gram Stain (surgical/deep wound)     Status: None (Preliminary result)   Collection Time: 09/30/22 12:51 PM   Specimen: Gallbladder; Abscess  Result Value Ref Range Status   Specimen Description   Final    GALL BLADDER Performed at [facility], [address], [place], [place] [address]    Special Requests   Final    NONE Performed at [facility], [address] [place], [place] [address]    Gram Stain   Final    NO WBC SEEN FEW GRAM VARIABLE ROD RARE GRAM POSITIVE COCCI  IN PAIRS Performed at [facility], [address], [place], [place] [address]    Culture   Final    RARE CITROBACTER FREUNDII RARE MORGANELLA MORGANII ABUNDANT ENTEROCOCCUS FAECALIS NO ANAEROBES ISOLATED; CULTURE IN PROGRESS FOR 5 DAYS    Report Status PENDING  Incomplete   Organism ID, Bacteria CITROBACTER FREUNDII  Final   Organism ID, Bacteria MORGANELLA MORGANII  Final   Organism ID, Bacteria ENTEROCOCCUS FAECALIS  Final      Susceptibility   Citrobacter freundii - MIC*    CEFEPIME <=0.12 SENSITIVE Sensitive     CEFTAZIDIME <=1 SENSITIVE Sensitive     CEFTRIAXONE <=0.25  SENSITIVE Sensitive     CIPROFLOXACIN <=0.25 SENSITIVE Sensitive     GENTAMICIN <=1 SENSITIVE Sensitive     IMIPENEM <=0.25 SENSITIVE Sensitive     TRIMETH/SULFA <=20 SENSITIVE Sensitive     PIP/TAZO <=4 SENSITIVE Sensitive     * RARE CITROBACTER FREUNDII   Enterococcus faecalis - MIC*    AMPICILLIN <=2 SENSITIVE Sensitive     VANCOMYCIN 2 SENSITIVE Sensitive     GENTAMICIN SYNERGY SENSITIVE Sensitive     * ABUNDANT ENTEROCOCCUS FAECALIS   Morganella morganii - MIC*    AMPICILLIN >=32 RESISTANT Resistant     CEFTAZIDIME <=1 SENSITIVE Sensitive     CIPROFLOXACIN <=0.25 SENSITIVE Sensitive     GENTAMICIN <=1 SENSITIVE Sensitive     IMIPENEM 4 SENSITIVE Sensitive     TRIMETH/SULFA <=20 SENSITIVE Sensitive     AMPICILLIN/SULBACTAM >=32 RESISTANT Resistant     PIP/TAZO <=4 SENSITIVE Sensitive     * RARE MORGANELLA MORGANII  Culture, blood (Routine X 2) w Reflex to ID Panel     Status: None (Preliminary result)   Collection Time: 10/05/22  6:31 AM   Specimen: BLOOD LEFT HAND  Result Value Ref Range Status   Specimen Description BLOOD LEFT HAND  Final   Special Requests   Final    BOTTLES DRAWN AEROBIC AND ANAEROBIC Blood Culture results may not be optimal due to an inadequate volume of blood received in culture bottles   Culture   Final    NO GROWTH <12 HOURS Performed at [facility],  [address] [place], [place] [address]    Report Status PENDING  Incomplete  Culture, blood (Routine X 2) w Reflex to ID Panel     Status: None (Preliminary result)   Collection Time: 10/05/22  6:31 AM   Specimen: BLOOD RIGHT HAND  Result Value Ref Range Status   Specimen Description BLOOD RIGHT HAND  Final   Special Requests   Final    BOTTLES DRAWN AEROBIC AND ANAEROBIC Blood Culture adequate volume   Culture   Final    NO GROWTH <12 HOURS Performed at [facility], [address] [place], [place] [address]    Report Status PENDING  Incomplete    IMAGING RESULTS: Ct abdomen  Pancreatic mass Hepatic mass Pulmonary nodules Gall bladder distension  I have personally reviewed the films ? Impression/Recommendation Metastatic pancreatic cancer involving head of the pancrease wit bile duct stricture s/p ERCP and stent placement. Gall bladder distension secondary to obstruction of main bile duct leading to infection  Subcapsular abscess- s/p drain- multiple organisms in culture including citrobacter, morganella and enterococcus all susceptible to zosyn Leucocytosis which was normalizing is worsening again but patient is improving with more energy, minimal pain and ambulating Pt is on zoysn - may consider meropenem if leucocytosis persist Worsening leucocytosis could be from the malignancy itself especially with SMV. Portal vein compression Watch for budd chiari  Afib- ? ? LADA- type I diabetes  AKI ? ___________________________________________________ Discussed with patient, and wife Note:  This document was prepared using Dragon voice recognition software and may include unintentional dictation errors.

## 2022-10-05 NOTE — Progress Notes (Signed)
   10/05/22 1100  Spiritual Encounters  Type of Visit Initial  Care provided to: Pt and family  Referral source Chaplain assessment  Reason for visit Routine spiritual support  OnCall Visit No  Spiritual Framework  Presenting Themes Coping tools;Courage hope and growth  Community/Connection Family;Friend(s);Significant other  Patient Stress Factors Health changes  Family Stress Factors None identified  Interventions  Spiritual Care Interventions Made Compassionate presence;Reflective listening;Encouragement  Spiritual Care Plan  Spiritual Care Issues Still Outstanding Chaplain will continue to follow   Chaplain sat and talked with patients brother and wife in ICU waiting room. Brother notified the chaplain that he feels like the patient is giving up and asked me if I would go and speak with him. Chaplain went and spoke with the patient and he said he was doing okay and was looking forward to the feeding tube being removed. Patient was visiting with another brother and sister in law when I entered the room. Chaplain told patient that we are here for emotional and spiritual support. Chaplain told the patient that I will come back and follow up with  him. Patient said he would like that very much. Chaplain went back to the waiting room and spoke with the family and they shared stories of coming from a family of 10 children and [place]. Wife shared that they have been married over 40 years.

## 2022-10-05 NOTE — Progress Notes (Signed)
Nutrition Follow-up  DOCUMENTATION CODES:   Severe malnutrition in context of chronic illness  INTERVENTION:   Ensure Enlive po TID, each supplement provides 350 kcal and 20 grams of protein.  Magic cup TID with meals, each supplement provides 290 kcal and 9 grams of protein  MVI po daily   Pt at high refeed risk; recommend monitor potassium, magnesium and phosphorus labs daily until stable  Daily weights   NUTRITION DIAGNOSIS:   Severe Malnutrition related to chronic illness (newly diagnosed pancreatic cancer) as evidenced by mild fat depletion, moderate fat depletion, moderate muscle depletion, severe muscle depletion, percent weight loss, energy intake < or equal to 75% for > or equal to 1 month. 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  -Pt s/p thoracentesis 7/16 with output  -Pt s/p NGT placement 7/17; tip noted gastric (removed today)  Met with pt in room today; pt reports that he is feeling better. Pt denies any nausea or vomiting but does report some abdominal pain with eating. NGT remains in place. Pt has been on a full liquid diet over the weekend. RN reports that patient has not been eating well on the full liquid diet as he does not really like the options on the full liquid diet. Pt's appetite and oral intake has remained poor since admission. Pt was placed on a mechanical soft diet yesterday. RN reports that patient ate a few bites of pancake for  breakfast this morning. Pt has been eating applesauce and drinking his Ensure. RD spoke with pt and family at bedside and discussed the importance of adequate nutrition needed to preserve lean muscle and support healing. SLP following and is concerned about pt receiving a regular diet with the large bore NGT in place. Pt is requesting to have the NGT removed. Will plan to remove NGT today and allow patient to eat by mouth. If pt's oral intake remains poor over the next couple of days, plan will be to place a small bore NGT and provide nutrition support. Pt and family agreeable to this plan; this was also discussed with SLP, MD and RN. RD will add supplements and MVI to help pt meet his estimated needs. Pt is at high refeed risk.    Per chart, pt appears to be up ~14lbs since admission but remains down ~11lbs from his UBW. Pt +7.7L on his I & Os.   Medications reviewed and include: colace, insulin, MVI, heparin, zosyn  Labs reviewed: Na 129(L), K 3.3(L), BUN 48(H), creat 1.65(H), P 3.0 wnl, Mg 2.4 wnl Wbc- 17.7(H) Cbgs- 145, 121, 123, 123 x 24 hrs   Drains-   UOP-   Diet Order:   Diet Order             DIET DYS 3 Room service appropriate? Yes; Fluid consistency: Thin  Diet effective now                  EDUCATION NEEDS:   Education needs  have been addressed  Skin:  Skin Assessment: Reviewed RN Assessment  Last BM:  7/21- type 6  Height:   Ht Readings from Last 1 Encounters:  10/03/22 6' (1.829 m)    Weight:   Wt Readings from Last 1 Encounters:  10/04/22 85.8 kg    Ideal Body Weight:  80.9 kg  BMI:  Body mass index is 25.65 kg/m.  Estimated Nutritional Needs:   Kcal:  2200-2500kcal/day  Protein:  110-125 grams  Fluid:  2.0-2.3L/day  Betsey Holiday MS, RD, LDN Please refer to [facility] for RD and/or RD on-call/weekend/after hours pager

## 2022-10-05 NOTE — Progress Notes (Addendum)
Progress Note   Patient: Cameron Gutierrez EXB:[id number] DOB: 20-Apr-1945 DOA: 09/26/2022     8 DOS: the patient was seen and examined on 10/05/2022   Brief hospital course:  77 yo male who presented to [facility] ER on 07/13 with acute onset of severe abdominal/chest pain.  He was previously admitted at [facility] from 09/21/22-09/25/22 for abdominal pain along with unintentional weight loss.  During hospitalization at [facility] workup concerning for primary metastatic pancreatic adenocarcinoma.  He underwent ERCP/US with biopsy and stent placed on 09/23/22.  His pain was controlled post procedure, and pt discharged home on 09/25/22 (prescribed oxycodone and eliquis resumed).  Upon arrival to [facility] ER significant lab results were: Na+ 133/K+ 3.4/glucose 148/calcium 7.4/albumin 2.2/wbc 15.7.  CT Abd Pelvis concerning for moderate distension of the gallbladder but no findings of acute cholecystitis, however pt continued to c/o persistent abdominal pain.  EDP consulted with [facility] Oncologist Dr. Everardo All, and following review of imaging did not feel pt required transfer to [facility].  Pt subsequently admitted to [facility] medsurg unit per hospitalist team for additional workup and treatment due to continued abdominal pain.   CT Abd/Pelvis W Contrast:  Large pancreatic head mass compatible with the patient's history of pancreatic cancer. The mass extends cephalad and involves the caudate lobe. There is also a subcapsular fluid collection or implant cephalad to the caudate lobe causing mass effect on the adjacent IVC. Multiple liver lesions, abdominal lymphadenopathy and multiple pulmonary nodules. Findings compatible with metastatic disease. Metallic biliary stent in the common bile duct and there is a small amount of pneumobilia. No significant intrahepatic biliary dilatation. In addition, there is moderate distention of the gallbladder containing iodinated contrast and small amount of gas. Findings compatible with recent ERCP. Gallbladder  distension could be a source of abdominal pain. Bilateral small pleural effusions with compressive atelectasis in the lower lobes. Small amount of free fluid in the abdomen and pelvis. Colonic diverticulosis without evidence for acute diverticulitis. Aortic Atherosclerosis (ICD10-I70.0). 9 mm splenic artery aneurysm.   CXR:  Low lung volumes with trace bilateral pleural effusions and bibasilar opacities favored to reflect areas of subsegmental atelectasis. Aortic atherosclerosis.  Significant Hospital events :   07/13: Pt admitted to the medsurg unit with diffuse abdominal pain following recent ERCP with stent placement and newly diagnosed primary pancreatic adenocarcinoma with metastasis  [place] Abd Limited RUQ revealed Gallbladder sludge with mild gallbladder wall thickening and small volume pericholecystic fluid. Positive sonographic Murphy sign. Findings are concerning for acute acalculous cholecystitis. Liver metastases seen on CT are not well identified by ultrasound.  07/14: General surgery consulted for acute acalculous cholecystitis and recommended decompression of the gallbladder via percutaneous cholecystostomy per IR and continue abx therapy.   07/15: Pt arrived to special recovery for cholecystostomy drain placement and pt noted to be in atrial fibrillation with rvr heart rate 140 to 170's.  Cardiology notified and the pt received 5 mg of iv cardizem x1 dose/250 ml NS bolus/20 mg of iv lasix.  Procedure canceled and pt transferred to the stepdown unit for cardizem gtt.  PCCM team consulted to assist with management.    09/29/22 patient in no distress, met with family today.  Awaiting percutaneous cholecystostomy and thoracentesis.  Testing for TLS today.  09/30/22 patient with emphysematous cholecystitis and pneumoperitoneum.  S/p throacentesis with partial clinical improvement.  Plan for IR consultation and cholecystostomy tube, completed by IR.    10/01/22 patient remains weak and chronically  ill appearing.  He is s/p GB  drain placement by IR and plan today to remove NGT and start PO nourishment.  He is being seen by palliative cancer specialist.  10/02/22 patient had BM overnight no longer with bowel obstruction does have NG still. Remains in AF on heparin, but vitals improved with low dose neo now. Palliative oncology following.  Patient wishes to have cancer therapy if possible. WBC count is normalized today.  10/03/22 : As patient was off vasopressors care was transitioned to hospitalist service.  No overnight events as per nursing.  Blood pressure remained stable off pressors with few soft readings.  Improvement in renal functions.  Nephrology following.  Patient tolerating full liquid diet.  NG can come out tomorrow.  10/04/2022: The patient was seen and examined at his bedside.  His wife was present in the room.  Passed a swallow evaluation.  Okay to do soft diet.  Will repeat LFTs and lipase level.  Will closely monitor to ensure tolerance to soft diet.  10/05/22:  Tolerating a soft diet well.  NG tube removed.  CT scan ab/p wo contrast ordered due to up-trending leukocytosis.  ID consulted to assist with the management.  Assessment and Plan:  #Acute hypoxic respiratory failure secondary secondary to bilateral pleural effusions and atelectasis  - Supplemental O2 for dyspnea and/or hypoxia  - Maintain O2 sats 92% or higher  - Prn bronchodilator therapy  - Aggressive pulmonary hygiene - Intermittent CXR and ABG's  -s/p throacentesis- 900cc removed  -atelelctasis noted - IS and metaneb ordered , discussed with RT  Continue to closely monitor on telemetry.   Acute Kidney Injury -KDIGO 3 Multifactorial including ischemic/metabolic Off IV fluid. Avoid nephrotoxic agents Monitor urine output, he has a foley in place  Acute urinary retention s/p foley cath placement on 09/28/22 Initially difficult to insert follow-up with urology   Permanent atrial fibrillation with rvr   Resolved hypotension secondary to antiarrhythmic medication  - Continuous telemetry monitoring Currently on amiodarone drip and heparin drip due to atrial fibrillation. - Cardiology consulted appreciate input: recommending if renal function normal can consider digoxin, however if pt has evidence of renal failure recommend amiodarone gtt due to cardizem gtt causing hypotension  - Echo done on 09/29/2022 revealed LVEF 65% with no left ventricle regional abnormalities. - Continue heparin gtt: dosing per pharmacy  No longer on IV vasopressors.  Currently on p.o. vasopressor, midodrine 10 mg 3 times daily.  Maintain MAP greater than 65. - Hold outpatient antihypertensives    #Acalculous emphysematous cholecystitis: Status post decompression now with JP drain in place. - Trend WBC and monitor fever curve  - Continue  abx therapy as outlined above  - General surgery consulted: recommended decompression of the gallbladder via percutaneous cholecystostomy per IR and continue abx therapy.  Procedure canceled 07/15 due to pt developing atrial fibrillation with rvr  JP drain placed on 09/30/2022 by IR. - Prn oxycodone/fentanyl/morphine for pain management  -zosyn-   Uptrending leukocytosis, repeat CT abdomen and pelvis without contrast on 10/05/22. Infectious disease consulted to assist with the management.   #Primary metastatic adenocarcinoma of the pancreas  - Oncology consulted appreciate input  Trend LFTs Lipase level normal.   #Type I diabetes mellitus with hyperglycemia Hemoglobin A1c 7.0 on 09/26/2022. - CBG's ac/hs - SSI   Physical debility PT OT assessment Fall precautions.  Moderate protein calorie malnutrition BMI 25 Moderate muscle mass loss Encourage oral intake and increasing protein calorie intake. Currently tolerating a soft diet, NG tube removed on 10/05/2022 Dietitian consulted  Physical Exam: Vitals:   10/05/22 0848 10/05/22 0900 10/05/22 1000 10/05/22 1100   BP:  90/66 95/68 99/72   Pulse:  91 92 98  Resp:  19 15 (!) 21  Temp: 98.2 F (36.8 C)     TempSrc: Oral     SpO2:  (!) 80% 95% 93%  Weight:      Height:       Physical Exam Constitutional:      General: He is in acute distress.     Appearance: He is not toxic-appearing.  Eyes:     Extraocular Movements: Extraocular movements intact.     Pupils: Pupils are equal, round, and reactive to light.  Neurological:     Mental Status: He is disoriented.   Lungs : Mild rales at bases.  No wheezing noted.  Poor inspiratory effort. Cardiovascular : Irregular rate and rhythm no rubs or gallops.  D-Penamine edema in upper extremities. Abdomen : Bowel sounds present.  JP drain x 2 on the right side of abdomen. Neuro : Follows command, moving all extremities pupil reactive to light and accommodation Genitourinary : Indwelling Foley catheter in place.  Data Reviewed:  There are no new results to review at this time.  Family Communication: Patient's son was updated by bedside  Disposition: Status is: Inpatient Remains inpatient appropriate because: Critical medical condition  Planned Discharge Destination:  TBD     Time spent: 50 minutes.  Author: Darlin Drop, DO 10/05/2022 11:50 AM  For on call review [email or web address].

## 2022-10-05 NOTE — Progress Notes (Signed)
ANTICOAGULATION CONSULT NOTE   Pharmacy Consult for IV heparin Indication: atrial fibrillation  No Known Allergies  Patient Measurements: Height: 6' (182.9 cm) Weight: 85.8 kg (189 lb 2.5 oz) IBW/kg (Calculated) : 77.6 Heparin Dosing Weight: 73.9 kg  Vital Signs: Temp: 98 F (36.7 C) (07/21 2000) Temp Source: Oral (07/21 2000) BP: 99/74 (07/21 2300) Pulse Rate: 83 (07/21 2315)  Labs: Recent Labs    10/02/22 0423 10/02/22 1338 10/03/22 0408 10/03/22 0751 10/04/22 0357 10/04/22 0358 10/04/22 1341 10/04/22 2350  HGB 10.3*  --  11.4*  --  11.2*  --   --   --   HCT 28.7*  --  31.8*  --  31.1*  --   --   --   PLT 187  --  206  --  196  --   --   --   HEPARINUNFRC 0.25*   < >  --    < > 0.27*  --  0.28* 0.42  CREATININE 2.49*  --  2.16*  --  1.99* 2.04*  --   --    < > = values in this interval not displayed.    Estimated Creatinine Clearance: 34.3 mL/min (A) (by C-G formula based on SCr of 2.04 mg/dL (H)).   Medical History: Past Medical History:  Diagnosis Date   Atrial fibrillation (HCC)    Cancer (HCC)    pancreatic   Diabetes mellitus without complication (HCC)    Hypercholesteremia    Hypertension     Medications:  Eliquis 5 mg BID (last dose 09/24/22)  Assessment: 77 year old male with PMH atrial fibrillation on Eliquis prior to admission. Plan was for cholecystectomy drain placement, but was cancelled due to acute Afib.   7/21 0357  HL 0.27 Subtherapeutic 7/21 1341  HL 0.28 Subtherapeutic 7/21 2350  HL 0.42 Therapeutic x1   Goal of Therapy:  HL 0.3 - 0.7  Monitor platelets by anticoagulation protocol: Yes   Plan:  7/21 2350  HL 0.42 Therapeutic x1 - continue heparin infusion at 1400 units/hr - check confirmatory heparin level in 8 hrs - CBC daily while on heparin.    Angelique Blonder, PharmD Clinical Pharmacist 10/05/2022 12:25 AM

## 2022-10-05 NOTE — Progress Notes (Signed)
ANTICOAGULATION CONSULT NOTE   Pharmacy Consult for IV heparin Indication: atrial fibrillation  No Known Allergies  Patient Measurements: Height: 6' (182.9 cm) Weight: 85.8 kg (189 lb 2.5 oz) IBW/kg (Calculated) : 77.6 Heparin Dosing Weight: 73.9 kg  Vital Signs: Temp: 98.2 F (36.8 C) (07/22 0848) Temp Source: Oral (07/22 0848) BP: 98/70 (07/22 0800) Pulse Rate: 88 (07/22 0800)  Labs: Recent Labs    10/03/22 0408 10/03/22 0751 10/04/22 0357 10/04/22 0358 10/04/22 1341 10/04/22 2350 10/05/22 0631 10/05/22 0633 10/05/22 0838  HGB 11.4*  --  11.2*  --   --   --  11.1*  --   --   HCT 31.8*  --  31.1*  --   --   --  31.5*  --   --   PLT 206  --  196  --   --   --  201  --   --   HEPARINUNFRC  --    < > 0.27*  --  0.28* 0.42  --   --  0.45  CREATININE 2.16*  --  1.99* 2.04*  --   --  1.71* 1.65*  --    < > = values in this interval not displayed.    Estimated Creatinine Clearance: 42.5 mL/min (A) (by C-G formula based on SCr of 1.65 mg/dL (H)).   Medical History: Past Medical History:  Diagnosis Date   Atrial fibrillation (HCC)    Cancer (HCC)    pancreatic   Diabetes mellitus without complication (HCC)    Hypercholesteremia    Hypertension     Medications:  Eliquis 5 mg BID (last dose 09/24/22)  Assessment: 77 year old male with PMH atrial fibrillation on Eliquis prior to admission. Plan was for cholecystectomy drain placement, but was cancelled due to acute Afib.   7/21 0357  HL 0.27 Subtherapeutic 7/21 1341  HL 0.28 Subtherapeutic 7/21 2350  HL 0.42 Therapeutic x1 7/22 0838 HL 0.45      Therapeutic x 2   Goal of Therapy:  HL 0.3 - 0.7  Monitor platelets by anticoagulation protocol: Yes   Plan: heparin level therapeutic x 2 - continue heparin infusion at 1400 units/hr - Transition to daily HL. Next heparin level tomorrow AM. - CBC daily while on heparin.     Elliot Gurney, PharmD, BCPS Clinical Pharmacist  10/05/2022 9:24 AM

## 2022-10-05 NOTE — TOC Progression Note (Signed)
Transition of Care [facility]) - Progression Note    Patient Details  Name: Cameron Gutierrez MRN: [id number] Date of Birth: 28-Aug-1945  Transition of Care [facility]) CM/SW Contact  Kreg Shropshire, RN Phone Number: 10/05/2022, 3:25 PM  Clinical Narrative:     PT/OT recommends SNF. Wife at bedside and their first preference is [facility]. Cm will initiate bed search with [facility] as primary. Cm will update family closer to d/c on SNF       Expected Discharge Plan and Services                                               Social Determinants of Health (SDOH) Interventions SDOH Screenings   Food Insecurity: No Food Insecurity (09/26/2022)  Housing: Low Risk  (09/26/2022)  Transportation Needs: No Transportation Needs (09/26/2022)  Utilities: Not At Risk (09/26/2022)  Financial Resource Strain: Low Risk  (09/21/2022)   Received from [facility]  Stress: No Stress Concern Present (09/21/2022)   Received from [facility]  Tobacco Use: Low Risk  (09/26/2022)  Health Literacy: Medium Risk (09/21/2022)   Received from [facility]    Readmission Risk Interventions     No data to display

## 2022-10-05 NOTE — Progress Notes (Signed)
Speech Language Pathology Treatment: Dysphagia  Patient Details Name: Cameron Gutierrez MRN: [id number] DOB: Mar 08, 1946 Today's Date: 10/05/2022 Time: 1030-1100 SLP Time Calculation (min) (ACUTE ONLY): 30 min  Assessment / Plan / Recommendation Clinical Impression  Pt seen for ongoing assessment of swallowing; education re: swallowing and optimal food consistencies in setting of pt swallowing w/ an NGT present. He is alert, verbally responsive and engaged in conversation w/ SLP; Wife and Son present in room.  Pt is on [place] O2 support 5L; Afebrile. Pt is Missing many Dentition baseline.  OF NOTE: NSG reported the NGT was in place for "decompression" earlier this admit, and it hasn't been removed yet -- not being utilized for any purpose currently. Consulted MD re: the possibility of removing it for safer pharyngeal swallowing. MD agreed. Dietician to talk w/ pt and Family re: TFs using a much smaller NG while continuing the current mech soft diet. Discussed the impact of the larger NGT including on the pharyngeal swallow(epiglottic inversion).   Pt and Family explained general aspiration precautions and agreed verbally to the need for following them especially sitting upright for all oral intake -- supported behind the back/head w/ towel roll for a more upright/head forward position. Family agreed. Pt assisted w/ positioning d/t min overall weakness.  Pt consumed trials of purees/softened solids and thin liquids via straw. No overt clinical s/s of aspiration were noted w/ any consistency; respiratory status remained unlabored, vocal quality clear b/t trials, no coughing, O2 sats remained 93-94% baseline. Pt declined need for effortful swallows to clear the moistened, small cut foods. Oral phase appeared to remain functional for bolus management, mastication, and timely A-P transfer for swallowing; oral clearing achieved w/ all consistencies. NSG denied any deficits w/ swallowing this morning w/ breakfast  foods(soft) as well. Re: Pill swallowing, NSG/pt endorsed getting "choked" on a Pill ~2 days -- he had the NGT in place and he was attempting to swallow the Pill w/ water, per NSG report. This has been corrected by giving pt his Pills CRUSHED in Puree, vs Whole. Pt reports early satiety but is taking bites/sips at each meal.  Dietician is following re: use of a small NG tube w/ current solid foods diet(for nutritional support d/t illness); MD made aware and will f/u also.   Pt appears at reduced risk for aspiration when following general aspiration precautions including Small cut foods well-moistened for ease of Mastication(sec. to Missing Dentition) then swallowing. Recommend continue a more Mech soft diet for ease of soft foods(easy to chew) w/ gravies added to moisten foods; Thin liquids. This diet will support conservation of energy.  Recommend general aspiration precautions; Pills Crushed vs Whole in Puree; tray setup and positioning assistance for meals. Nutritional support; foods for Pleasure to engage interest in eating. No further skilled ST services indicated currently; will sign off at this time w/ MD to reconsult if any new needs while admitted. MD/NSG/Dietician updated. Precautions posted at bedside. Family updated and agreed.       HPI HPI: Per H&P "Cameron Gutierrez is a 77 y.o. male with medical history significant of adult onset type I diabetes follows with [facility] endocrinology, hypertension, CKD stage II, hyperlipidemia, a fib on eliquis presents to the emergency room with abdominal pain which started early hours of this morning at 4 o'clock.     Patient was recently admitted at [facility] from 09/21/22--09/25/22 when he went there for abdominal pain and unintentional weight loss and workup showed patient has primary metastatic  pancreatic adenocarcinoma.     Patient underwent ERCP/US with biopsy and stent placement on July 11 at [facility]. He was discharge yesterday with oral oxycodone. Patient  was tolerating some diet. Came in after he started having severe abdominal pain started around 4 o'clock. Denies any vomiting.     In the ER patient received 4 mg of IV morphine. He is tachycardic. Blood pressure otherwise stable. Wife is at bedside.     Patient has follow-up appointment with surgical Oncology at [facility] on July 22.  Pt is being admitted for  pain control.  CT abdomen pelvis, 09/25/21, " 1. Large pancreatic head mass compatible with the patient's history  of pancreatic cancer. The mass extends cephalad and involves the  caudate lobe. There is also a subcapsular fluid collection or  implant cephalad to the caudate lobe causing mass effect on the  adjacent IVC.  2. Multiple liver lesions, abdominal lymphadenopathy and multiple  pulmonary nodules. Findings compatible with metastatic disease.  3. Metallic biliary stent in the common bile duct and there is a  small amount of pneumobilia. No significant intrahepatic biliary  dilatation. In addition, there is moderate distention of the  gallbladder containing iodinated contrast and small amount of gas.  Findings compatible with recent ERCP. Gallbladder distension could  be a source of abdominal pain.  4. Bilateral small pleural effusions with compressive atelectasis in  the lower lobes.  5. Small amount of free fluid in the abdomen and pelvis.  6. Colonic diverticulosis without evidence for acute diverticulitis.  7. Aortic Atherosclerosis (ICD10-I70.0).  8.9 mm splenic artery aneurysm.".   Pt has had an NGT in place for decompression for several days; NSG reported it is not longer being utilized/needed.  MD consulted and requested removal of NGT for safer pharyngeal swallowing, if the NGT is not going to be used for TF support.      SLP Plan  All goals met      Recommendations for follow up therapy are one component of a multi-disciplinary discharge planning process, led by the attending physician.  Recommendations may be updated based on patient  status, additional functional criteria and insurance authorization.    Recommendations  Diet recommendations: Dysphagia 3 (mechanical soft);Thin liquid (w/ added pleasure foods cut small, moistened well -- avoid Salads) Liquids provided via: Cup;Straw Medication Administration: Whole meds with puree (vs Crushed in Puree if needed for ease of swallowing them) Supervision: Patient able to self feed;Staff to assist with self feeding;Intermittent supervision to cue for compensatory strategies Compensations: Minimize environmental distractions;Slow rate;Small sips/bites;Lingual sweep for clearance of pocketing;Follow solids with liquid Postural Changes and/or Swallow Maneuvers: Out of bed for meals;Seated upright 90 degrees;Upright 30-60 min after meal                 (Dietician f/u) Oral care BID;Oral care before and after PO;Staff/trained caregiver to provide oral care (support)   Intermittent Supervision/Assistance (overall weakness) Dysphagia, unspecified (R13.10)     All goals met       Jerilynn Som, MS, CCC-SLP Speech Language Pathologist Rehab Services; [facility] [phone] (ascom) Ebany Bowermaster  10/05/2022, 12:06 PM

## 2022-10-05 NOTE — Plan of Care (Signed)
  Problem: Education: Goal: Ability to describe self-care measures that may prevent or decrease complications (Diabetes Survival Skills Education) will improve Outcome: Not Progressing    Problem: Education: Goal: Ability to describe self-care measures that may prevent or decrease complications (Diabetes Survival Skills Education) will improve Outcome: Not Progressing   Problem: Coping: Goal: Ability to adjust to condition or change in health will improve Outcome: Not Progressing   Problem: Fluid Volume: Goal: Ability to maintain a balanced intake and output will improve Outcome: Not Progressing   Problem: Health Behavior/Discharge Planning: Goal: Ability to manage health-related needs will improve Outcome: Not Progressing   Problem: Metabolic: Goal: Ability to maintain appropriate glucose levels will improve Outcome: Not Progressing   Problem: Nutritional: Goal: Maintenance of adequate nutrition will improve Outcome: Not Progressing Goal: Progress toward achieving an optimal weight will improve Outcome: Not Progressing   Problem: Skin Integrity: Goal: Risk for impaired skin integrity will decrease Outcome: Not Progressing   Problem: Tissue Perfusion: Goal: Adequacy of tissue perfusion will improve Outcome: Not Progressing

## 2022-10-05 NOTE — Progress Notes (Signed)
Physical Therapy Treatment Patient Details Name: Cameron Gutierrez MRN: [id number] DOB: 1945-10-14 Today's Date: 10/05/2022   History of Present Illness 77 yo male who presented to [facility] ER on 07/13 with acute onset of severe abdominal/chest pain.  He was previously admitted at [facility] from 09/21/22-09/25/22 for abdominal pain along with unintentional weight loss.  During hospitalization at [facility] workup concerning for primary metastatic pancreatic adenocarcinoma.  He underwent ERCP/US with biopsy and stent placed on 09/23/22.  His pain was controlled post procedure, and pt discharged home on 09/25/22    PT Comments  Patient supine in bed upon arrival, agreeable to tx session this date. Patient was [employer] with Bed mobility, and Min to Mod A with transfers and ambulation, +2 assist required for w/c follow and lines/lead management. Patient able to complete activities without supplemental oxygen and Sp02 stable with activity, RN notified. Patient able to ambulate 10 ft x 2 trials with rest break, limited due to fatigue this date. Patient continues to demo progress with acute PT services. Will continue to follow acutely.     Assistance Recommended at Discharge Intermittent Supervision/Assistance  If plan is discharge home, recommend the following:  Can travel by private vehicle    A little help with walking and/or transfers;A little help with bathing/dressing/bathroom;Assistance with cooking/housework;Assist for transportation;Help with stairs or ramp for entrance   No  Equipment Recommendations  Other (comment) (TBD at next level of care)    Recommendations for Other Services       Precautions / Restrictions Precautions Precautions: Fall Restrictions Weight Bearing Restrictions: No     Mobility  Bed Mobility Overal bed mobility: Needs Assistance Bed Mobility: Supine to Sit, Sit to Supine     Supine to sit: Min guard Sit to supine: Min guard   General bed mobility comments:  Patient able to get to EOB and move BLE with min guard for steadying. Denies lightheadedness/SOB. Oxygen weaned with activity and Sp02 monitored.    Transfers Overall transfer level: Needs assistance Equipment used: Rolling walker (2 wheels) Transfers: Sit to/from Stand Sit to Stand: Min assist, Mod assist           General transfer comment: from EOB patient able to stand with minA and verbal cues for hand placement/technique. Second attempt completed from recliner, Mod A required to lower seat height.UE reliant on RW    Ambulation/Gait Ambulation/Gait assistance: Min assist, +2 safety/equipment (lines/leads) Gait Distance (Feet): 8 Feet (x 2) Assistive device: Rolling walker (2 wheels) Gait Pattern/deviations: Step-through pattern, Trunk flexed       General Gait Details: Increased UE reliance on RW, but Sp02 low 90's without oxygen this date. HR did in increased to mid 120's with activity. Able to ambulate 8 ft prior to seated rest break. Then completed additional trial back to bed. Chair follow, 2nd person assist for lines/lead and for safety. Pt limited in distance due to fatigue   Stairs             Wheelchair Mobility     Tilt Bed    Modified Rankin (Stroke Patients Only)       Balance Overall balance assessment: Needs assistance Sitting-balance support: Bilateral upper extremity supported Sitting balance-Leahy Scale: Good     Standing balance support: Bilateral upper extremity supported Standing balance-Leahy Scale: Fair Standing balance comment: Pt continue to demo increased reliance on RW  Cognition Arousal/Alertness: Awake/alert Behavior During Therapy: WFL for tasks assessed/performed Overall Cognitive Status: Within Functional Limits for tasks assessed                                 General Comments: pleasant throughout session and motivated        Exercises      General Comments         Pertinent Vitals/Pain Pain Assessment Pain Assessment: No/denies pain    Home Living                          Prior Function            PT Goals (current goals can now be found in the care plan section) Acute Rehab PT Goals PT Goal Formulation: With patient Time For Goal Achievement: 10/17/22 Potential to Achieve Goals: Fair Progress towards PT goals: Progressing toward goals    Frequency    Min 1X/week      PT Plan Current plan remains appropriate    Co-evaluation PT/OT/SLP Co-Evaluation/Treatment: Yes Reason for Co-Treatment: For patient/therapist safety;To address functional/ADL transfers PT goals addressed during session: Mobility/safety with mobility        AM-PAC PT "6 Clicks" Mobility   Outcome Measure  Help needed turning from your back to your side while in a flat bed without using bedrails?: A Little Help needed moving from lying on your back to sitting on the side of a flat bed without using bedrails?: A Lot Help needed moving to and from a bed to a chair (including a wheelchair)?: A Little Help needed standing up from a chair using your arms (e.g., wheelchair or bedside chair)?: A Lot Help needed to walk in hospital room?: A Little Help needed climbing 3-5 steps with a railing? : Total 6 Click Score: 14    End of Session Equipment Utilized During Treatment: Gait belt Activity Tolerance: Patient limited by fatigue Patient left: with family/visitor present;in bed;with call bell/phone within reach Nurse Communication: Other (comment);Mobility status (Sp02 readings with activity) PT Visit Diagnosis: Muscle weakness (generalized) (M62.81);Difficulty in walking, not elsewhere classified (R26.2)     Time: 0454-0981 PT Time Calculation (min) (ACUTE ONLY): 17 min  Charges:    $Gait Training: 8-22 mins PT General Charges $$ ACUTE PT VISIT: 1 Visit                     Creed Copper Fairly, PT, DPT 10/05/22 3:29 PM

## 2022-10-05 NOTE — Evaluation (Signed)
Occupational Therapy Evaluation Patient Details Name: Cameron Gutierrez MRN: [id number] DOB: 1945-11-20 Today's Date: 10/05/2022   History of Present Illness 77 yo male who presented to [facility] ER on 07/13 with acute onset of severe abdominal/chest pain.  He was previously admitted at [facility] from 09/21/22-09/25/22 for abdominal pain along with unintentional weight loss.  During hospitalization at [facility] workup concerning for primary metastatic pancreatic adenocarcinoma.  He underwent ERCP/US with biopsy and stent placed on 09/23/22.  His pain was controlled post procedure, and pt discharged home on 09/25/22   Clinical Impression   Patient presenting with decreased Ind in self care,balance, functional mobility/transfers, endurance, and safety awareness. Patient reports being Ind at baseline without use of AD and working full time.  Patient currently functioning at min - mod A and needing +2 assistance for mobility secondary to lines/leads/ and drains. Pt motivated throughout but fatigues quickly. Pt able to be left on RA with O2 saturation remaining above 90% . Patient will benefit from acute OT to increase overall independence in the areas of ADLs, functional mobility, and safety awareness in order to safely discharge.     Recommendations for follow up therapy are one component of a multi-disciplinary discharge planning process, led by the attending physician.  Recommendations may be updated based on patient status, additional functional criteria and insurance authorization.   Assistance Recommended at Discharge Intermittent Supervision/Assistance  Patient can return home with the following A lot of help with walking and/or transfers;A lot of help with bathing/dressing/bathroom;Assistance with cooking/housework;Assist for transportation;Help with stairs or ramp for entrance    Functional Status Assessment  Patient has had a recent decline in their functional status and demonstrates the ability to  make significant improvements in function in a reasonable and predictable amount of time.  Equipment Recommendations  Other (comment) (defer to next venue of care)       Precautions / Restrictions Precautions Precautions: Fall Restrictions Weight Bearing Restrictions: No      Mobility Bed Mobility Overal bed mobility: Needs Assistance Bed Mobility: Supine to Sit, Sit to Supine     Supine to sit: Min guard Sit to supine: Min guard   General bed mobility comments: Patient able to get to EOB and move BLE with min guard for steadying. Denies lightheadedness/SOB. Oxygen weaned with activity and Sp02 monitored.    Transfers Overall transfer level: Needs assistance Equipment used: Rolling walker (2 wheels) Transfers: Sit to/from Stand Sit to Stand: Min assist, Mod assist, +2 safety/equipment           General transfer comment: from EOB patient able to stand with minA and verbal cues for hand placement/technique. Second attempt completed from recliner, Mod A required to lower seat height.UE reliant on RW      Balance Overall balance assessment: Needs assistance Sitting-balance support: Bilateral upper extremity supported Sitting balance-Leahy Scale: Good     Standing balance support: Bilateral upper extremity supported Standing balance-Leahy Scale: Fair Standing balance comment: Pt continue to demo increased reliance on RW                           ADL either performed or assessed with clinical judgement   ADL Overall ADL's : Needs assistance/impaired                                       General ADL Comments: UB self  care set up A - min A with mod - max A for LB self care. +2 assistance for functional transfers and short distance mobility secondary to lines/leads/drains.     Vision Patient Visual Report: No change from baseline              Pertinent Vitals/Pain Pain Assessment Pain Assessment: No/denies pain     Hand Dominance  Right   Extremity/Trunk Assessment Upper Extremity Assessment Upper Extremity Assessment: Generalized weakness   Lower Extremity Assessment Lower Extremity Assessment: Generalized weakness       Communication Communication Communication: No difficulties   Cognition Arousal/Alertness: Awake/alert Behavior During Therapy: WFL for tasks assessed/performed Overall Cognitive Status: Within Functional Limits for tasks assessed                                 General Comments: pleasant throughout session and motivated                Home Living Family/patient expects to be discharged to:: Private residence Living Arrangements: Spouse/significant other Available Help at Discharge: Family;Available 24 hours/day Type of Home: House Home Access: Stairs to enter [employer] of Steps: 1 Entrance Stairs-Rails: None Home Layout: One level     Bathroom Shower/Tub: Tub/shower unit         Home Equipment: Agricultural consultant (2 wheels);Cane - single point          Prior Functioning/Environment Prior Level of Function : Working/employed;Independent/Modified Independent             Mobility Comments: drives, still installing fences, 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 ADLs Comments: independent with ADLs and IADLs        OT Problem List: Decreased strength;Decreased activity tolerance;Decreased safety awareness;Impaired balance (sitting and/or standing);Decreased knowledge of use of DME or AE      OT Treatment/Interventions: Self-care/ADL training;Therapeutic exercise;Therapeutic activities;Energy conservation;DME and/or AE instruction;Patient/family education;Balance training    OT Goals(Current goals can be found in the care plan section) Acute Rehab OT Goals Patient Stated Goal: to get stronger and return home OT Goal Formulation: With patient/family Time For Goal Achievement: 10/19/22 Potential to Achieve Goals: Fair ADL Goals Pt Will Perform Grooming: with  supervision;standing Pt Will Perform Lower Body Dressing: with min assist;sit to/from stand Pt Will Transfer to Toilet: with min assist;ambulating Pt Will Perform Toileting - Clothing Manipulation and hygiene: with min assist;sit to/from stand  OT Frequency: Min 1X/week    Co-evaluation PT/OT/SLP Co-Evaluation/Treatment: Yes Reason for Co-Treatment: For patient/therapist safety;To address functional/ADL transfers PT goals addressed during session: Mobility/safety with mobility OT goals addressed during session: ADL's and self-care      AM-PAC OT "6 Clicks" Daily Activity     Outcome Measure Help from another person eating meals?: None Help from another person taking care of personal grooming?: A Little Help from another person toileting, which includes using toliet, bedpan, or urinal?: A Lot Help from another person bathing (including washing, rinsing, drying)?: A Lot Help from another person to put on and taking off regular upper body clothing?: A Little Help from another person to put on and taking off regular lower body clothing?: A Lot 6 Click Score: 16   End of Session Equipment Utilized During Treatment: Rolling walker (2 wheels) Nurse Communication: Mobility status  Activity Tolerance: Patient tolerated treatment well Patient left: in bed;with call bell/phone within reach;with bed alarm set;with family/visitor present  OT Visit Diagnosis: Unsteadiness on feet (R26.81)  Time: 0254-2706 OT Time Calculation (min): 18 min Charges:  OT General Charges $OT Visit: 1 Visit OT Evaluation $OT Eval Moderate Complexity: [address], [place], OTR/L , CBIS ascom [phone]  10/05/22, 4:15 PM

## 2022-10-05 NOTE — Progress Notes (Signed)
[facility]  ROUNDING NOTE   Subjective:   76 yo male who presented to [facility] ER on 07/13 with acute onset of severe abdominal/chest pain.  He was previously admitted at [facility] from 09/21/22-09/25/22 for abdominal pain along with unintentional weight loss.  During hospitalization at [facility] workup concerning for primary metastatic pancreatic adenocarcinoma.  He underwent ERCP/US with biopsy and stent placed on 09/23/22.  [name] is at bedside.   NG tube is out. States he has been able to take in some clears.    Objective:  Vital signs in last 24 hours:  Temp:  [97.7 F (36.5 C)-98.2 F (36.8 C)] 98.2 F (36.8 C) (07/22 0848) Pulse Rate:  [32-156] 98 (07/22 1100) Resp:  [13-24] 21 (07/22 1100) BP: (90-126)/(61-86) 99/72 (07/22 1100) SpO2:  [80 %-98 %] 93 % (07/22 1100)  Weight change:  Filed Weights   10/02/22 0420 10/03/22 0500 10/04/22 0500  Weight: 81.9 kg 83 kg 85.8 kg    Intake/Output: I/O last 3 completed shifts: In: 2523.8 [P.O.:400; I.V.:1242.6; Other:50; NG/GT:100; IV Piggyback:731.3] Out: 2555 [Urine:2340; Drains:215]   Intake/Output this shift:  Total I/O In: 438.5 [P.O.:240; I.V.:131.3; NG/GT:40; IV Piggyback:27.1] Out: 225 [Urine:225]  Physical Exam: General: NAD, cachectic, laying in bed  Head: Normocephalic, atraumatic. Moist oral mucosal membranes  Eyes: Anicteric,  Neck: Supple, trachea midline  Lungs:  [place] O2, normal breathing effort.  Heart: Irregular rhythm  Abdomen:  Soft, +epigastric tenderness, RUQ drain  Extremities:  Has some dependent peripheral edema.  Neurologic: Alert, able to follow simple commands.  Skin: No lesions  Access: none    Basic Metabolic Panel: Recent Labs  Lab 09/30/22 0334 10/01/22 0448 10/01/22 2000 10/02/22 0423 10/03/22 0406 10/03/22 0408 10/04/22 0357 10/04/22 0358 10/05/22 0631 10/05/22 0633  NA 133* 133*  --  133*  --  132* 128* 129* 130* 129*  K 3.7 3.0*   < > 3.0*  --  2.9* 3.1*  3.2* 3.4* 3.3*  CL 98 98  --  100  --  101 99 100 99 99  CO2 22 22  --  22  --  20* 18* 20* 20* 19*  GLUCOSE 138* 132*  --  119*  --  144* 133* 133* 120* 119*  BUN 89* 86*  --  76*  --  65* 59* 58* 43* 48*  CREATININE 3.05* 2.96*  --  2.49*  --  2.16* 1.99* 2.04* 1.71* 1.65*  CALCIUM 7.1* 6.9*  --  6.9*  --  7.2* 7.0* 7.1* 7.4* 7.3*  MG 2.5* 2.5*  --  2.4 2.5*  --   --   --  2.4  --   PHOS 7.7* 6.7*  --  4.4  --  3.7  --  3.3  --  3.0   < > = values in this interval not displayed.    Liver Function Tests: Recent Labs  Lab 09/29/22 0949 09/30/22 0334 10/01/22 0448 10/02/22 1044 10/03/22 0408 10/04/22 0358 10/05/22 0631 10/05/22 0633  AST 222* 424*  --  844*  --  607* 284*  --   ALT 106* 174*  --  310*  --  288* 198*  --   ALKPHOS 192* 277*  --  685*  --  767* 631*  --   BILITOT 1.6* 1.6*  --  2.5*  --  1.6* 1.9*  --   PROT 5.6* 5.2*  --  4.7*  --  4.8* 5.2*  --   ALBUMIN 2.0* 1.9*   < >  1.9* 1.9* 1.9*  1.8* 2.0* 1.9*   < > = values in this interval not displayed.   Recent Labs  Lab 10/04/22 0358  LIPASE 29    No results for input(s): "AMMONIA" in the last 168 hours.  CBC: Recent Labs  Lab 10/01/22 0448 10/02/22 0423 10/03/22 0408 10/04/22 0357 10/05/22 0631  WBC 14.1* 9.0 11.8* 15.1* 17.7*  HGB 12.1* 10.3* 11.4* 11.2* 11.1*  HCT 33.3* 28.7* 31.8* 31.1* 31.5*  MCV 75.9* 77.2* 75.7* 76.6* 78.6*  PLT 273 187 206 196 201    Cardiac Enzymes: No results for input(s): "CKTOTAL", "CKMB", "CKMBINDEX", "TROPONINI" in the last 168 hours.  BNP: Invalid input(s): "POCBNP"  CBG: Recent Labs  Lab 10/05/22 0313 10/05/22 0401 10/05/22 0741 10/05/22 1130 10/05/22 1520  GLUCAP 123* 123* 121* 145* 147*    Microbiology: Results for orders placed or performed during the hospital encounter of 09/26/22  MRSA Next Gen by PCR, Nasal     Status: None   Collection Time: 09/28/22  4:12 PM   Specimen: Nasal Mucosa; Nasal Swab  Result Value Ref Range Status   MRSA by PCR  Next Gen NOT DETECTED NOT DETECTED Final    Comment: (NOTE) The GeneXpert MRSA Assay (FDA approved for NASAL specimens only), is one component of a comprehensive MRSA colonization surveillance program. It is not intended to diagnose MRSA infection nor to guide or monitor treatment for MRSA infections. Test performance is not FDA approved in patients less than 19 years old. Performed at [facility], [address] [place], [place] [address]   Aerobic/Anaerobic Culture w Gram Stain (surgical/deep wound)     Status: None   Collection Time: 09/30/22 12:51 PM   Specimen: Gallbladder; Abscess  Result Value Ref Range Status   Specimen Description   Final    GALL BLADDER Performed at [facility], [address] [place], [place] [address]    Special Requests   Final    NONE Performed at [facility], [address] [place], [place] [address]    Gram Stain   Final    NO WBC SEEN FEW GRAM VARIABLE ROD RARE GRAM POSITIVE COCCI IN PAIRS    Culture   Final    RARE CITROBACTER FREUNDII RARE MORGANELLA MORGANII ABUNDANT ENTEROCOCCUS FAECALIS NO ANAEROBES ISOLATED Performed at [facility], [address], [place], [place] [address]    Report Status 10/05/2022 FINAL  Final   Organism ID, Bacteria CITROBACTER FREUNDII  Final   Organism ID, Bacteria MORGANELLA MORGANII  Final   Organism ID, Bacteria ENTEROCOCCUS FAECALIS  Final      Susceptibility   Citrobacter freundii - MIC*    CEFEPIME <=0.12 SENSITIVE Sensitive     CEFTAZIDIME <=1 SENSITIVE Sensitive     CEFTRIAXONE <=0.25 SENSITIVE Sensitive     CIPROFLOXACIN <=0.25 SENSITIVE Sensitive     GENTAMICIN <=1 SENSITIVE Sensitive     IMIPENEM <=0.25 SENSITIVE Sensitive     TRIMETH/SULFA <=20 SENSITIVE Sensitive     PIP/TAZO <=4 SENSITIVE Sensitive     * RARE CITROBACTER FREUNDII   Enterococcus faecalis - MIC*    AMPICILLIN <=2 SENSITIVE Sensitive     VANCOMYCIN 2 SENSITIVE Sensitive      GENTAMICIN SYNERGY SENSITIVE Sensitive     * ABUNDANT ENTEROCOCCUS FAECALIS   Morganella morganii - MIC*    AMPICILLIN >=32 RESISTANT Resistant     CEFTAZIDIME <=1 SENSITIVE Sensitive     CIPROFLOXACIN <=0.25 SENSITIVE Sensitive     GENTAMICIN <=  1 SENSITIVE Sensitive     IMIPENEM 4 SENSITIVE Sensitive     TRIMETH/SULFA <=20 SENSITIVE Sensitive     AMPICILLIN/SULBACTAM >=32 RESISTANT Resistant     PIP/TAZO <=4 SENSITIVE Sensitive     * RARE MORGANELLA MORGANII  Culture, blood (Routine X 2) w Reflex to ID Panel     Status: None (Preliminary result)   Collection Time: 10/05/22  6:31 AM   Specimen: BLOOD LEFT HAND  Result Value Ref Range Status   Specimen Description BLOOD LEFT HAND  Final   Special Requests   Final    BOTTLES DRAWN AEROBIC AND ANAEROBIC Blood Culture results may not be optimal due to an inadequate volume of blood received in culture bottles   Culture   Final    NO GROWTH <12 HOURS Performed at [facility], [address], [place], [place] [address]    Report Status PENDING  Incomplete  Culture, blood (Routine X 2) w Reflex to ID Panel     Status: None (Preliminary result)   Collection Time: 10/05/22  6:31 AM   Specimen: BLOOD RIGHT HAND  Result Value Ref Range Status   Specimen Description BLOOD RIGHT HAND  Final   Special Requests   Final    BOTTLES DRAWN AEROBIC AND ANAEROBIC Blood Culture adequate volume   Culture   Final    NO GROWTH <12 HOURS Performed at [facility], [address] [place], [place] [address]    Report Status PENDING  Incomplete    Coagulation Studies: No results for input(s): "LABPROT", "INR" in the last 72 hours.   Urinalysis: No results for input(s): "COLORURINE", "LABSPEC", "PHURINE", "GLUCOSEU", "HGBUR", "BILIRUBINUR", "KETONESUR", "PROTEINUR", "UROBILINOGEN", "NITRITE", "LEUKOCYTESUR" in the last 72 hours.  Invalid input(s): "APPERANCEUR"     Imaging: No results found.   Medications:    sodium  chloride 5 mL/hr at 10/05/22 1100   amiodarone 30 mg/hr (10/05/22 1100)   heparin 1,400 Units/hr (10/05/22 1100)   piperacillin-tazobactam (ZOSYN)  IV Stopped (10/05/22 0914)   promethazine (PHENERGAN) injection (IM or IVPB)      atorvastatin  40 mg Oral q morning   Chlorhexidine Gluconate Cloth  6 each Topical QHS   docusate sodium  100 mg Oral BID   feeding supplement  237 mL Oral TID BM   insulin aspart  0-9 Units Subcutaneous Q4H   midodrine  10 mg Oral TID WC   multivitamin with minerals  1 tablet Oral Daily   potassium chloride  40 mEq Oral Once   sodium chloride flush  5 mL Intracatheter Q8H   fentaNYL (SUBLIMAZE) injection, iohexol, levalbuterol, metoprolol tartrate, ondansetron **OR** ondansetron (ZOFRAN) IV, oxyCODONE, polyethylene glycol, promethazine (PHENERGAN) injection (IM or IVPB), traMADol, traZODone  Assessment/ Plan:  [name] is a 77 y.o.  male  with atrial fibrillation, pancreatic cancer, diabetes mellitus type II, hyperlipidemia, hypertension, who was admitted to [facility] on 09/26/2022 for Generalized abdominal pain [R10.84] Adenocarcinoma of pancreas (HCC) [C25.9] Primary pancreatic cancer with metastasis to other site [facility]) [C25.9]  Nephrology consulted for acute kidney injury.    Acute Kidney Injury: baseline creatinine of 0.97 with GFR >60 on admission, 7/13. VI contrast exposure on 7/13. Suspect IV contrast induced nephropathy plus prerenal azotemia. - creatinine slowly improving. Nonoliguric urine output. 1700 cc yesterday - holding home dose of lisinopril -Continue to monitor. -Hypotension managed with midodrine   A-fib.  RVR causing hypotension and hemodynamic instability.  Cardiology team on board. Acalculous emphysematous cholecystitis-status post decompression.  Patient  now with JP drain. 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 Diabetes type 2-hemoglobin A1c 7.0% from  09/26/2022. Hypokalemia-pharmacist assisting in replacement.  . Hyponatremia-related to poor oral intake and AKI.   LOS: 8 Dalaya Suppa 7/22/20243:33 PM

## 2022-10-06 DIAGNOSIS — K810 Acute cholecystitis: Secondary | ICD-10-CM | POA: Diagnosis not present

## 2022-10-06 DIAGNOSIS — C259 Malignant neoplasm of pancreas, unspecified: Secondary | ICD-10-CM | POA: Diagnosis not present

## 2022-10-06 LAB — COMPREHENSIVE METABOLIC PANEL
ALT: 139 U/L — ABNORMAL HIGH (ref 0–44)
AST: 138 U/L — ABNORMAL HIGH (ref 15–41)
Albumin: 1.7 g/dL — ABNORMAL LOW (ref 3.5–5.0)
Alkaline Phosphatase: 512 U/L — ABNORMAL HIGH (ref 38–126)
Anion gap: 9 (ref 5–15)
BUN: 44 mg/dL — ABNORMAL HIGH (ref 8–23)
CO2: 22 mmol/L (ref 22–32)
Calcium: 7.2 mg/dL — ABNORMAL LOW (ref 8.9–10.3)
Chloride: 96 mmol/L — ABNORMAL LOW (ref 98–111)
Creatinine, Ser: 1.6 mg/dL — ABNORMAL HIGH (ref 0.61–1.24)
GFR, Estimated: 45 mL/min — ABNORMAL LOW (ref >60)
Glucose, Bld: 142 mg/dL — ABNORMAL HIGH (ref 70–99)
Potassium: 3.8 mmol/L (ref 3.5–5.1)
Sodium: 127 mmol/L — ABNORMAL LOW (ref 135–145)
Total Bilirubin: 1.7 mg/dL — ABNORMAL HIGH (ref 0.3–1.2)
Total Protein: 4.9 g/dL — ABNORMAL LOW (ref 6.5–8.1)

## 2022-10-06 LAB — CULTURE, BLOOD (ROUTINE X 2): Special Requests: ADEQUATE

## 2022-10-06 LAB — CBC
HCT: 29.2 % — ABNORMAL LOW (ref 39.0–52.0)
Hemoglobin: 10.2 g/dL — ABNORMAL LOW (ref 13.0–17.0)
MCH: 28.2 pg (ref 26.0–34.0)
MCHC: 34.9 g/dL (ref 30.0–36.0)
MCV: 80.7 fL (ref 80.0–100.0)
Platelets: 213 10*3/uL (ref 150–400)
RBC: 3.62 MIL/uL — ABNORMAL LOW (ref 4.22–5.81)
RDW: 14.7 % (ref 11.5–15.5)
WBC: 19.4 10*3/uL — ABNORMAL HIGH (ref 4.0–10.5)
nRBC: 0 % (ref 0.0–0.2)

## 2022-10-06 LAB — GLUCOSE, CAPILLARY
Glucose-Capillary: 133 mg/dL — ABNORMAL HIGH (ref 70–99)
Glucose-Capillary: 167 mg/dL — ABNORMAL HIGH (ref 70–99)
Glucose-Capillary: 160 mg/dL — ABNORMAL HIGH (ref 70–99)
Glucose-Capillary: 175 mg/dL — ABNORMAL HIGH (ref 70–99)

## 2022-10-06 LAB — MAGNESIUM: Magnesium: 2.4 mg/dL (ref 1.7–2.4)

## 2022-10-06 LAB — HEPARIN LEVEL (UNFRACTIONATED): Heparin Unfractionated: 0.3 IU/mL (ref 0.30–0.70)

## 2022-10-06 LAB — PHOSPHORUS: Phosphorus: 3.5 mg/dL (ref 2.5–4.6)

## 2022-10-06 MED ORDER — ORAL CARE MOUTH RINSE: 15.0000 mL | OROMUCOSAL | Status: DC | PRN

## 2022-10-06 MED ORDER — ORAL CARE MOUTH RINSE
15.0000 mL | OROMUCOSAL | Status: DC
  Administered 2022-10-06 – 2022-10-12 (×24): 15 mL via OROMUCOSAL

## 2022-10-06 MED ORDER — BISACODYL 10 MG RE SUPP
10.0000 mg | Freq: Once | RECTAL | Status: AC
  Administered 2022-10-06: 10 mg via RECTAL
  Filled 2022-10-06: qty 1

## 2022-10-06 MED ORDER — MELATONIN 5 MG PO TABS
5.0000 mg | ORAL_TABLET | Freq: Once | ORAL | Status: AC
  Administered 2022-10-06: 5 mg via ORAL
  Filled 2022-10-06: qty 1

## 2022-10-06 MED ORDER — SODIUM CHLORIDE 0.9 % IV SOLN
1.0000 g | Freq: Two times a day (BID) | INTRAVENOUS | Status: DC
  Administered 2022-10-06 – 2022-10-08 (×5): 1 g via INTRAVENOUS
  Filled 2022-10-06 (×5): qty 20

## 2022-10-06 NOTE — Progress Notes (Signed)
Pharmacy Antibiotic Note  Cameron Gutierrez is a 77 y.o. male admitted on 09/26/2022 with  cholecystitis .  Pharmacy has been consulted for meropenem dosing.  Leukocytosis uptrending despite Zosyn.  Plan: Meropenem 1 gram every 12 hours  Height: 6' (182.9 cm) Weight: 89.8 kg (197 lb 15.6 oz) IBW/kg (Calculated) : 77.6  Temp (24hrs), Avg:97.7 F (36.5 C), Min:97.7 F (36.5 C), Max:97.7 F (36.5 C)  Recent Labs  Lab 10/02/22 0423 10/03/22 0408 10/04/22 0357 10/04/22 0358 10/05/22 0631 10/05/22 0633 10/06/22 0619  WBC 9.0 11.8* 15.1*  --  17.7*  --  19.4*  CREATININE 2.49* 2.16* 1.99* 2.04* 1.71* 1.65* 1.60*    Estimated Creatinine Clearance: 43.8 mL/min (A) (by C-G formula based on SCr of 1.6 mg/dL (H)).    No Known Allergies  Antimicrobials this admission: Zosyn 7/17 >> 7/23 Meropenem 7/23 >>   Dose adjustments this admission: N/a  Microbiology results: 7/22 BCx: NG x 1 day 7/17 UCx: gall bladder: C. Freunii, Morganella morganii, E. faucalis   Thank you for allowing pharmacy to be a part of this patient's care.  Elliot Gurney, PharmD, BCPS Clinical Pharmacist  10/06/2022 3:45 PM

## 2022-10-06 NOTE — Progress Notes (Signed)
Progress Note   Patient: Cameron Gutierrez WUJ:[id number] DOB: 02-12-1946 DOA: 09/26/2022     9 DOS: the patient was seen and examined on 10/06/2022   Brief hospital course:  77 yo male who presented to [facility] ER on 07/13 with acute onset of severe abdominal/chest pain.  He was previously admitted at [facility] from 09/21/22-09/25/22 for abdominal pain along with unintentional weight loss.  During hospitalization at [facility] workup concerning for primary metastatic pancreatic adenocarcinoma.  He underwent ERCP/US with biopsy and stent placed on 09/23/22.  His pain was controlled post procedure, and pt discharged home on 09/25/22 (prescribed oxycodone and eliquis resumed).  Upon arrival to [facility] ER significant lab results were: Na+ 133/K+ 3.4/glucose 148/calcium 7.4/albumin 2.2/wbc 15.7.  CT Abd Pelvis concerning for moderate distension of the gallbladder but no findings of acute cholecystitis, however pt continued to c/o persistent abdominal pain.  EDP consulted with [facility] Oncologist Dr. Everardo All, and following review of imaging did not feel pt required transfer to [facility].  Pt subsequently admitted to [facility] medsurg unit per hospitalist team for additional workup and treatment due to continued abdominal pain.   CT Abd/Pelvis W Contrast:  Large pancreatic head mass compatible with the patient's history of pancreatic cancer. The mass extends cephalad and involves the caudate lobe. There is also a subcapsular fluid collection or implant cephalad to the caudate lobe causing mass effect on the adjacent IVC. Multiple liver lesions, abdominal lymphadenopathy and multiple pulmonary nodules. Findings compatible with metastatic disease. Metallic biliary stent in the common bile duct and there is a small amount of pneumobilia. No significant intrahepatic biliary dilatation. In addition, there is moderate distention of the gallbladder containing iodinated contrast and small amount of gas. Findings compatible with recent ERCP. Gallbladder  distension could be a source of abdominal pain. Bilateral small pleural effusions with compressive atelectasis in the lower lobes. Small amount of free fluid in the abdomen and pelvis. Colonic diverticulosis without evidence for acute diverticulitis. Aortic Atherosclerosis (ICD10-I70.0). 9 mm splenic artery aneurysm.   CXR:  Low lung volumes with trace bilateral pleural effusions and bibasilar opacities favored to reflect areas of subsegmental atelectasis. Aortic atherosclerosis.  Significant Hospital events :   07/13: Pt admitted to the medsurg unit with diffuse abdominal pain following recent ERCP with stent placement and newly diagnosed primary pancreatic adenocarcinoma with metastasis  [place] Abd Limited RUQ revealed Gallbladder sludge with mild gallbladder wall thickening and small volume pericholecystic fluid. Positive sonographic Murphy sign. Findings are concerning for acute acalculous cholecystitis. Liver metastases seen on CT are not well identified by ultrasound.  07/14: General surgery consulted for acute acalculous cholecystitis and recommended decompression of the gallbladder via percutaneous cholecystostomy per IR and continue abx therapy.   07/15: Pt arrived to special recovery for cholecystostomy drain placement and pt noted to be in atrial fibrillation with rvr heart rate 140 to 170's.  Cardiology notified and the pt received 5 mg of iv cardizem x1 dose/250 ml NS bolus/20 mg of iv lasix.  Procedure canceled and pt transferred to the stepdown unit for cardizem gtt.  PCCM team consulted to assist with management.    09/29/22 patient in no distress, met with family today.  Awaiting percutaneous cholecystostomy and thoracentesis.  Testing for TLS today.  09/30/22 patient with emphysematous cholecystitis and pneumoperitoneum.  S/p throacentesis with partial clinical improvement.  Plan for IR consultation and cholecystostomy tube, completed by IR.    10/01/22 patient remains weak and chronically  ill appearing.  He is s/p GB  drain placement by IR and plan today to remove NGT and start PO nourishment.  He is being seen by palliative cancer specialist.  10/02/22 patient had BM overnight no longer with bowel obstruction does have NG still. Remains in AF on heparin, but vitals improved with low dose neo now. Palliative oncology following.  Patient wishes to have cancer therapy if possible. WBC count is normalized today.  10/03/22 : As patient was off vasopressors care was transitioned to hospitalist service.  No overnight events as per nursing.  Blood pressure remained stable off pressors with few soft readings.  Improvement in renal functions.  Nephrology following.  Patient tolerating full liquid diet.  NG can come out tomorrow.  10/04/2022: The patient was seen and examined at his bedside.  His wife was present in the room.  Passed a swallow evaluation.  Okay to do soft diet.  Will repeat LFTs and lipase level.  Will closely monitor to ensure tolerance to soft diet.  10/05/22:  Tolerating a soft diet well.  NG tube removed.  CT scan ab/p wo contrast ordered due to up-trending leukocytosis.  ID consulted to assist with the management.  10/06/22: The patient was seen and examined at his bedside.  His son was present in the room.  His leukocytosis continues to uptrend.  Started Merrem and DC'd the Zosyn today.  Appreciate infectious disease assistance.  Assessment and Plan:  #Acute hypoxic respiratory failure secondary secondary to bilateral pleural effusions and atelectasis  - Supplemental O2 for dyspnea and/or hypoxia  - Maintain O2 sats 92% or higher  - Prn bronchodilator therapy  - Aggressive pulmonary hygiene - Intermittent CXR and ABG's  -s/p throacentesis- 900cc removed  -atelelctasis noted - IS and metaneb ordered , discussed with RT  Continue to closely monitor on telemetry.   Acute Kidney Injury -KDIGO 3 Multifactorial including ischemic/metabolic Off IV fluid. Avoid nephrotoxic  agents Monitor urine output, he has a foley in place  Acute urinary retention s/p foley cath placement on 09/28/22 Initially difficult to insert follow-up with urology   Permanent atrial fibrillation with rvr  Resolved hypotension secondary to antiarrhythmic medication  - Continuous telemetry monitoring Currently on amiodarone drip and heparin drip due to atrial fibrillation. - Cardiology consulted appreciate input: recommending if renal function normal can consider digoxin, however if pt has evidence of renal failure recommend amiodarone gtt due to cardizem gtt causing hypotension  - Echo done on 09/29/2022 revealed LVEF 65% with no left ventricle regional abnormalities. - Continue heparin gtt: dosing per pharmacy  No longer on IV vasopressors.  Currently on p.o. vasopressor, midodrine 10 mg 3 times daily.  Maintain MAP greater than 65. - Hold outpatient antihypertensives    #Acalculous emphysematous cholecystitis: Status post decompression now with JP drain in place. - Trend WBC and monitor fever curve    His leukocytosis continues to uptrend.  Started Merrem and DC'd the Zosyn 7/23.  Appreciate infectious disease assistance. - Continue  abx therapy as outlined above  - General surgery consulted: recommended decompression of the gallbladder via percutaneous cholecystostomy per IR and continue abx therapy.  Procedure canceled 07/15 due to pt developing atrial fibrillation with rvr  JP drain placed on 09/30/2022 by IR. - Prn oxycodone/fentanyl/morphine for pain management  -zosyn-   Uptrending leukocytosis, repeat CT abdomen and pelvis without contrast on 10/05/22. Infectious disease consulted to assist with the management.   #Primary metastatic adenocarcinoma of the pancreas  - Oncology consulted appreciate input  Trend LFTs Lipase level normal.   #  Type I diabetes mellitus with hyperglycemia Hemoglobin A1c 7.0 on 09/26/2022. - CBG's ac/hs - SSI   Physical debility Continue PT OT  with assistance and fall precautions. Fall precautions.  Moderate protein calorie malnutrition BMI 25 Moderate muscle mass loss Encourage oral intake and increasing protein calorie intake. Currently tolerating a soft diet, NG tube removed on 10/05/2022 Dietitian consulted        Physical Exam: Vitals:   10/06/22 1000 10/06/22 1100 10/06/22 1200 10/06/22 1300  BP: 102/68 102/73 97/73 94/74   Pulse: 79 87 85 85  Resp: 16 (!) 23 17 17   Temp:   97.7 F (36.5 C)   TempSrc:   Axillary   SpO2: 100% 100% 97% 100%  Weight:      Height:      Physical Exam Constitutional:      General: He is in no acute distress.    Appearance: He is not toxic-appearing.  Eyes:     Extraocular Movements: Extraocular movements intact.     Pupils: Pupils are equal, round, and reactive to light.  Neurological:     Mental Status: He is alert and oriented x 3  Lungs : Mild rales at bases.  No wheezing noted.  Poor inspiratory effort. Cardiovascular : Irregular rate and rhythm no rubs or gallops.  D-Penamine edema in upper extremities. Abdomen : Bowel sounds present.  JP drain x 2 on the right side of abdomen. Neuro : Follows command, moving all extremities pupil reactive to light and accommodation Genitourinary : Indwelling Foley catheter in place.  Data Reviewed:  There are no new results to review at this time.  Family Communication: Patient's son was updated by bedside  Disposition: Status is: Inpatient Remains inpatient appropriate because: Critical medical condition  Planned Discharge Destination:  TBD     Time spent: 35 minutes.  Author: Darlin Drop, DO 10/06/2022 2:25 PM  For on call review [email or web address].

## 2022-10-06 NOTE — Progress Notes (Signed)
ANTICOAGULATION CONSULT NOTE   Pharmacy Consult for IV heparin Indication: atrial fibrillation  No Known Allergies  Patient Measurements: Height: 6' (182.9 cm) Weight: 89.8 kg (197 lb 15.6 oz) IBW/kg (Calculated) : 77.6 Heparin Dosing Weight: 73.9 kg  Vital Signs: Temp: 97.7 F (36.5 C) (07/23 0400) Temp Source: Oral (07/23 0400) BP: 109/71 (07/23 0700) Pulse Rate: 77 (07/23 0830)  Labs: Recent Labs    10/04/22 0357 10/04/22 0358 10/04/22 2350 10/05/22 0631 10/05/22 2130 10/05/22 0838 10/06/22 0619 10/06/22 0904  HGB 11.2*  --   --  11.1*  --   --  10.2*  --   HCT 31.1*  --   --  31.5*  --   --  29.2*  --   PLT 196  --   --  201  --   --  213  --   HEPARINUNFRC 0.27*   < > 0.42  --   --  0.45  --  0.30  CREATININE 1.99*   < >  --  1.71* 1.65*  --  1.60*  --    < > = values in this interval not displayed.    Estimated Creatinine Clearance: 43.8 mL/min (A) (by C-G formula based on SCr of 1.6 mg/dL (H)).   Medical History: Past Medical History:  Diagnosis Date   Atrial fibrillation (HCC)    Cancer (HCC)    pancreatic   Diabetes mellitus without complication (HCC)    Hypercholesteremia    Hypertension     Medications:  Eliquis 5 mg BID (last dose 09/24/22)  Assessment: 77 year old male with PMH atrial fibrillation on Eliquis prior to admission. Plan was for cholecystectomy drain placement, but was cancelled due to acute Afib.   7/21 0357  HL 0.27 Subtherapeutic 7/21 1341  HL 0.28 Subtherapeutic 7/21 2350  HL 0.42 Therapeutic x1 7/22 0838 HL 0.45      Therapeutic x 2 7/23 0904 HL 0.30 Therapeutic   Goal of Therapy:  HL 0.3 - 0.7  Monitor platelets by anticoagulation protocol: Yes   Plan:  --Continue heparin infusion at 1400 units/hr --Next heparin level tomorrow AM. --CBC daily while on heparin.     Elliot Gurney, PharmD, BCPS Clinical Pharmacist  10/06/2022 9:40 AM

## 2022-10-06 NOTE — [place] FL2 (Signed)
[place] MEDICAID FL2 LEVEL OF CARE FORM     IDENTIFICATION  Patient Name: Cameron Gutierrez Birthdate: 05-29-1945 Sex: male Admission Date (Current Location): 09/26/2022  [facility] and [place] Number:  Chiropodist and Address:  [facility], [address] [place], [place] [address]      Provider Number: [id number]  Attending Physician Name and Address:  Darlin Drop, DO  Relative Name and Phone Number:  Dior Stepter [phone]    Current Level of Care: Hospital Recommended Level of Care: Skilled Nursing Facility Prior Approval Number:    Date Approved/Denied:   PASRR Number: [id number]  Discharge Plan: SNF    Current Diagnoses: Patient Active Problem List   Diagnosis Date Noted   Palliative care encounter 09/29/2022   Pleural effusion 09/29/2022   Protein-calorie malnutrition, severe 09/28/2022   Atrial fibrillation with RVR (HCC) 09/28/2022   Septic shock (HCC) 09/27/2022   Acute acalculous cholecystitis 09/27/2022   Primary pancreatic cancer with metastasis to other site [facility]) 09/26/2022   Generalized abdominal pain 09/26/2022   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) 09/26/2022   Permanent atrial fibrillation (HCC) 09/26/2022   Hyperglycemia 03/27/2022   Pseudohyponatremia 03/27/2022   Chronic kidney disease, stage 3b (HCC) 03/27/2022   Essential hypertension 03/27/2022   Mixed hyperlipidemia 03/27/2022   History of atrial fibrillation 03/27/2022    Orientation RESPIRATION BLADDER Height & Weight     Self, Time, Situation, Place  O2 Indwelling catheter Weight: 89.8 kg Height:  6' (182.9 cm)  BEHAVIORAL SYMPTOMS/MOOD NEUROLOGICAL BOWEL NUTRITION STATUS      Continent Diet (see d/c summary)  AMBULATORY STATUS COMMUNICATION OF NEEDS Skin   Limited Assist Verbally Normal                       Personal Care Assistance Level of Assistance  Bathing, Feeding, Dressing Bathing Assistance:  Limited assistance Feeding assistance: Limited assistance Dressing Assistance: Limited assistance     Functional Limitations Info             SPECIAL CARE FACTORS FREQUENCY  PT (By licensed PT), OT (By licensed OT)     PT Frequency: x5 min daily OT Frequency: x5 min daily            Contractures Contractures Info: Not present    Additional Factors Info  Code Status Code Status Info: DNR             Current Medications (10/06/2022):  This is the current hospital active medication list Current Facility-Administered Medications  Medication Dose Route Frequency Provider Last Rate Last Admin   0.9 %  sodium chloride infusion  250 mL Intravenous Continuous Ezequiel Essex, NP   Stopped at 10/05/22 1622   amiodarone (NEXTERONE PREMIX) 360-4.14 MG/200ML-% (1.8 mg/mL) IV infusion  30 mg/hr Intravenous Continuous Enedina Finner, MD 16.67 mL/hr at 10/06/22 0718 30 mg/hr at 10/06/22 0718   atorvastatin (LIPITOR) tablet 40 mg  40 mg Oral q morning Enedina Finner, MD   40 mg at 10/06/22 1005   bisacodyl (DULCOLAX) suppository 10 mg  10 mg Rectal Once Darlin Drop, DO       Chlorhexidine Gluconate Cloth 2 % PADS 6 each  6 each Topical QHS Enedina Finner, MD   6 each at 10/05/22 2150   docusate sodium (COLACE) capsule 100 mg  100 mg Oral BID Enedina Finner, MD   100 mg at 10/06/22 1005   feeding supplement (  ENSURE ENLIVE / ENSURE PLUS) liquid 237 mL  237 mL Oral TID BM Hall, Carole N, DO   237 mL at 10/06/22 1005   fentaNYL (SUBLIMAZE) injection 50 mcg  50 mcg Intravenous Q4H PRN Enedina Finner, MD   50 mcg at 10/06/22 0044   heparin ADULT infusion 100 units/mL (25000 units/252mL)  1,400 Units/hr Intravenous Continuous Mila Merry A, RPH 14 mL/hr at 10/06/22 0600 1,400 Units/hr at 10/06/22 0600   insulin aspart (novoLOG) injection 0-9 Units  0-9 Units Subcutaneous Q4H Rust-Chester, Britton L, NP   1 Units at 10/06/22 0836   levalbuterol (XOPENEX) nebulizer solution 0.63 mg  0.63 mg Nebulization Q6H  PRN Ezequiel Essex, NP   0.63 mg at 10/06/22 8295   metoprolol tartrate (LOPRESSOR) injection 5 mg  5 mg Intravenous Q4H PRN Enedina Finner, MD   5 mg at 09/28/22 1456   midodrine (PROAMATINE) tablet 10 mg  10 mg Oral TID WC Harlon Ditty D, NP   10 mg at 10/06/22 6213   multivitamin with minerals tablet 1 tablet  1 tablet Oral Daily Enedina Finner, MD   1 tablet at 10/06/22 1005   ondansetron (ZOFRAN) tablet 4 mg  4 mg Oral Q6H PRN Enedina Finner, MD       Or   ondansetron [facility]) injection 4 mg  4 mg Intravenous Q6H PRN Enedina Finner, MD   4 mg at 09/28/22 1638   oxyCODONE (Oxy IR/ROXICODONE) immediate release tablet 10 mg  10 mg Oral Q4H PRN Enedina Finner, MD   10 mg at 10/06/22 0835   piperacillin-tazobactam (ZOSYN) IVPB 3.375 g  3.375 g Intravenous Q8H Rust-Chester, Britton L, NP 12.5 mL/hr at 10/06/22 0616 3.375 g at 10/06/22 0616   polyethylene glycol (MIRALAX / GLYCOLAX) packet 17 g  17 g Oral Daily PRN Enedina Finner, MD   17 g at 10/06/22 0408   promethazine (PHENERGAN) 12.5 mg in sodium chloride 0.9 % 50 mL IVPB  12.5 mg Intravenous Q6H PRN Enedina Finner, MD       sodium chloride flush (NS) 0.9 % injection 5 mL  5 mL Intracatheter Q8H Suttle, Thressa Sheller, MD   5 mL at 10/06/22 0616   traMADol (ULTRAM) tablet 50 mg  50 mg Oral Q6H PRN Enedina Finner, MD   50 mg at 09/27/22 2252   traZODone (DESYREL) tablet 25 mg  25 mg Oral QHS PRN Enedina Finner, MD   25 mg at 10/05/22 2238     Discharge Medications: Please see discharge summary for a list of discharge medications.  Relevant Imaging Results:  Relevant Lab Results:   Additional Information SSN#[id number]  Kreg Shropshire, RN

## 2022-10-06 NOTE — Progress Notes (Signed)
Date of Admission:  09/26/2022     ID: Cameron Gutierrez is a 77 y.o. 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 with a history of LADA, HTN, CKD, AFIB, HLD, RT TKA , metastatic pancreatic ca was  admitted with abdominal pain on 09/26/22 Pt was recently in [facility] between 7/8-7/11/24 with epigastric pain and weight loss and found to have pancreatic head mass and obstruction of the bile duct. 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 He was discharged home to follow up with oncology. He came to [facility] on 7/13 with worsening abdominal pain  Subjective: None available as he is sleeping Wife says he is feeling a little better Able to eat some  Medications:   atorvastatin  40 mg Oral q morning   Chlorhexidine Gluconate Cloth  6 each Topical QHS   docusate sodium  100 mg Oral BID   feeding supplement  237 mL Oral TID BM   insulin aspart  0-9 Units Subcutaneous Q4H   midodrine  10 mg Oral TID WC   multivitamin with minerals  1 tablet Oral Daily   mouth rinse  15 mL Mouth Rinse 4 times per day   sodium chloride flush  5 mL Intracatheter Q8H    Objective: Vital signs in last 24 hours: Patient Vitals for the past 24 hrs:  BP Temp Temp src Pulse Resp SpO2 Weight  10/06/22 1300 94/74 -- -- 85 17 100 % --  10/06/22 1200 97/73 97.7 F (36.5 C) Axillary 85 17 97 % --  10/06/22 1100 102/73 -- -- 87 (!) 23 100 % --  10/06/22 1000 102/68 -- --  79 16 100 % --  10/06/22 0900 96/64 -- -- -- 14 -- --  10/06/22 0830 -- -- -- 77 12 93 % --  10/06/22 0813 -- -- -- -- -- 93 % --  10/06/22 0800 105/73 98 F (36.7 C) Oral 89 19 92 % --  10/06/22 0700 109/71 -- -- 79 18 91 % --  10/06/22 0600 96/74 -- -- 78 13 (!) 85 % --  10/06/22 0500 -- -- -- -- -- -- 89.8 kg  10/06/22 0400 95/73 97.7 F (36.5 C) Oral 84 19 93 % --  10/06/22 0200 99/68 -- -- 88 (!) 22 (!) 88 % --  10/06/22 0000 101/74 -- -- 91 17 92 % --  10/05/22 2200 101/73 -- -- 87 19 92 % --  10/05/22 2000 99/76 97.7 F (36.5 C) Oral 90 (!) 21 91 % --  10/05/22 1900 100/65 -- -- 91 -- 91 % --  10/05/22 1800 99/65 -- -- 86 15 91 % --  10/05/22 1700 105/71 -- -- 90 14 91 % --  10/05/22 1600 97/72 -- -- 93 (!) 22 91 % --  10/05/22 1500 103/72 -- -- -- 17 -- --     Wife at bed side PHYSICAL EXAM:  General: sleeping.  Lungs: b/la  ir entry. Heart:irregular. Abdomen: rt upper quadrant drain Extremities: atraumatic, no cyanosis. No edema. No clubbing Skin: No rashes or lesions. Or bruising Lymph: Cervical, supraclavicular normal. Neurologic: Grossly non-focal  Lab Results    Latest Ref Rng & Units 10/06/2022    6:19 AM 10/05/2022    6:31 AM 10/04/2022    3:57 AM  CBC  WBC 4.0 - 10.5 K/uL 19.4  17.7  15.1   Hemoglobin 13.0 - 17.0 g/dL 70.6  23.7  62.8   Hematocrit 39.0 - 52.0 % 29.2  31.5  31.1   Platelets 150 - 400 K/uL 213  201  196        Latest Ref Rng & Units 10/06/2022    6:19 AM 10/05/2022    6:33 AM 10/05/2022    6:31 AM  CMP  Glucose 70 - 99 mg/dL 315  176  160   BUN 8 - 23 mg/dL 44  48  43   Creatinine 0.61 - 1.24 mg/dL 7.37  1.06  2.69   Sodium 135 - 145 mmol/L 127  129  130   Potassium 3.5 - 5.1 mmol/L 3.8  3.3  3.4   Chloride 98 - 111 mmol/L 96  99  99   CO2 22 - 32 mmol/L 22  19  20    Calcium 8.9 - 10.3 mg/dL 7.2  7.3  7.4   Total Protein 6.5 - 8.1 g/dL 4.9   5.2   Total Bilirubin 0.3 - 1.2 mg/dL 1.7   1.9   Alkaline Phos 38 - 126 U/L 512   631    AST 15 - 41 U/L 138   284   ALT 0 - 44 U/L 139   198       Microbiology:  Studies/Results: CT ABDOMEN PELVIS WO CONTRAST  Result Date: 10/05/2022 CLINICAL DATA:  Ascites. Recent diagnosis of pancreatic cancer and biliary obstruction status post common bile duct stent placement. A facet [place]/structured acute cholecystitis. EXAM: CT ABDOMEN AND PELVIS WITHOUT CONTRAST TECHNIQUE: Multidetector CT imaging of the abdomen and pelvis was performed following the standard protocol without IV contrast. RADIATION DOSE REDUCTION: This exam was performed according to the departmental dose-optimization program which includes automated exposure control, adjustment of the mA and/or kV according to patient size and/or use of iterative reconstruction technique. COMPARISON:  CT abdomen pelvis dated 09/29/2022. FINDINGS: Evaluation of this exam is limited in the absence of intravenous contrast. Lower chest: Partially visualized small bilateral pleural effusions and consolidative changes of the majority of the visualized lower lobes with air bronchograms which may represent atelectasis or pneumonia. A 5 mm nodule in the lingula as well as a 6 mm left lower lobe nodule. Small pockets of pneumoperitoneum in the upper abdomen likely introduced via drainage catheter placement. There is a small ascites. Hepatobiliary: Hypodense mass involving the caudate lobe of the liver measure up to 7.4 cm in keeping with metastatic disease. Additional ill-defined hypodense lesions within the liver also consistent with metastatic disease. There is mild dilatation and pneumobilia. The gallbladder is poorly visualized. There is air in the gallbladder in keeping with emphysematous cholecystitis. A percutaneous cholecystostomy with pigtail tip in the body of the gallbladder noted. There is debris within the gallbladder. Pancreas: Ill-defined mass at the head of the pancreas measuring 6.5 x 7.3 cm in keeping with known malignancy. This mass  appears to encase the common bile duct. A common bowel duct stent is noted. There is air in the proximal lumen of the stent. There is  debris or infiltrative mass in the distal lumen of the stent. Spleen: Normal in size without focal abnormality. Adrenals/Urinary Tract: The adrenal glands are unremarkable. Mild bilateral hydronephrosis versus parapelvic cysts. No renal calculi noted. The visualized ureters appear unremarkable. The urinary bladder is decompressed around a Foley catheter. Stomach/Bowel: There is sigmoid diverticulosis without active inflammatory changes. There is no bowel obstruction. The appendix is suboptimally visualized but grossly unremarkable. Vascular/Lymphatic: Mild aortoiliac atherosclerotic disease. The IVC is unremarkable. No portal venous gas. Retroperitoneal adenopathy including a 2 cm retrocaval lymph node. Reproductive: The prostate and seminal vesicles are grossly unremarkable. Other: Small fat containing bilateral inguinal hernia. There is diffuse subcutaneous edema. Musculoskeletal: No acute osseous pathology. IMPRESSION: 1. 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 2. Pancreatic head mass in keeping with known malignancy. 3. Hepatic metastatic disease. 4. Retroperitoneal adenopathy. 5. Small ascites. 6. Small bilateral pleural effusions and bibasilar atelectasis or infiltrate. 7. Aortic Atherosclerosis (ICD10-I70.0) and Emphysema (ICD10-J43.9). Electronically Signed   By: Elgie Collard M.D.   On: 10/05/2022 19:23     Assessment/Plan: Metastatic pancreatic cancer involving head of the pancrease wit bile duct stricture s/p ERCP and stent placement. Gall bladder distension secondary to obstruction of main bile duct leading to infection has perc cholecystostomy   Subcapsular abscess- s/p drain- multiple organisms in culture including citrobacter, morganella and enterococcus all susceptible to zosyn Leucocytosis which was normalizing is worsening again  zosyn changed to meropenem  Worsening leucocytosis could be from the malignancy itself especially with SMV. Portal vein compression Watch for budd chiari   Afib- ? ? LADA- type I diabetes   AKI ? ____________________________ Discussed with care team

## 2022-10-06 NOTE — TOC Progression Note (Signed)
Transition of Care [facility]) - Progression Note    Patient Details  Name: Cameron Gutierrez MRN: [id number] Date of Birth: 1945-07-21  [facility] CM/SW Contact  Kreg Shropshire, RN Phone Number: 10/06/2022, 10:20 AM  Clinical Narrative:    Cm preparing for d/c. PT/OT recommended SNF for short term rehab. Bed search initated        Expected Discharge Plan and Services                                               Social Determinants of Health (SDOH) Interventions SDOH Screenings   Food Insecurity: No Food Insecurity (09/26/2022)  Housing: Low Risk  (09/26/2022)  Transportation Needs: No Transportation Needs (09/26/2022)  Utilities: Not At Risk (09/26/2022)  Financial Resource Strain: Low Risk  (09/21/2022)   Received from [facility]  Stress: No Stress Concern Present (09/21/2022)   Received from [facility]  Tobacco Use: Low Risk  (09/26/2022)  Health Literacy: Medium Risk (09/21/2022)   Received from [facility]    Readmission Risk Interventions     No data to display

## 2022-10-06 NOTE — Progress Notes (Signed)
[facility]  ROUNDING NOTE   Subjective:   77 yo male who presented to [facility] ER on 07/13 with acute onset of severe abdominal/chest pain.  He was previously admitted at [facility] from 09/21/22-09/25/22 for abdominal pain along with unintentional weight loss.  During hospitalization at [facility] workup concerning for primary metastatic pancreatic adenocarcinoma.  He underwent ERCP/US with biopsy and stent placed on 09/23/22.  Patient seen and evaluated bedside in ICU Wife at bedside Alert and oriented Remains on 3 L nasal cannula Appetite remains poor  Heparin drip  Objective:  Vital signs in last 24 hours:  Temp:  [97.7 F (36.5 C)] 97.7 F (36.5 C) (07/23 0400) Pulse Rate:  [77-98] 79 (07/23 1000) Resp:  [12-22] 16 (07/23 1000) BP: (95-109)/(64-76) 102/68 (07/23 1000) SpO2:  [85 %-100 %] 100 % (07/23 1000) Weight:  [89.8 kg] 89.8 kg (07/23 0500)  Weight change:  Filed Weights   10/03/22 0500 10/04/22 0500 10/06/22 0500  Weight: 83 kg 85.8 kg 89.8 kg    Intake/Output: I/O last 3 completed shifts: In: 1582.3 [P.O.:240; I.V.:1152.2; Other:40; NG/GT:40; IV Piggyback:110.1] Out: 2445 [Urine:2255; Drains:190]   Intake/Output this shift:  No intake/output data recorded.  Physical Exam: General: NAD, cachectic, laying in bed  Head: Normocephalic, atraumatic. Moist oral mucosal membranes  Eyes: Anicteric,  Neck: Supple, trachea midline  Lungs:  [place] O2, normal breathing effort.  Heart: Irregular rhythm  Abdomen:  Soft, +epigastric tenderness, RUQ drain  Extremities:  Has some dependent peripheral edema.  Neurologic: Alert, able to follow simple commands.  Skin: No lesions  Access: none    Basic Metabolic Panel: Recent Labs  Lab 10/01/22 0448 10/01/22 2000 10/02/22 0423 10/03/22 0406 10/03/22 0408 10/04/22 8841 10/04/22 6606 10/05/22 0631 10/05/22 0633 10/06/22 0619  NA 133*  --  133*  --  132* 128* 129* 130* 129* 127*  K 3.0*   < > 3.0*  --  2.9*  3.1* 3.2* 3.4* 3.3* 3.8  CL 98  --  100  --  101 99 100 99 99 96*  CO2 22  --  22  --  20* 18* 20* 20* 19* 22  GLUCOSE 132*  --  119*  --  144* 133* 133* 120* 119* 142*  BUN 86*  --  76*  --  65* 59* 58* 43* 48* 44*  CREATININE 2.96*  --  2.49*  --  2.16* 1.99* 2.04* 1.71* 1.65* 1.60*  CALCIUM 6.9*  --  6.9*  --  7.2* 7.0* 7.1* 7.4* 7.3* 7.2*  MG 2.5*  --  2.4 2.5*  --   --   --  2.4  --  2.4  PHOS 6.7*  --  4.4  --  3.7  --  3.3  --  3.0 3.5   < > = values in this interval not displayed.    Liver Function Tests: Recent Labs  Lab 09/30/22 0334 10/01/22 0448 10/02/22 1044 10/03/22 0408 10/04/22 0358 10/05/22 0631 10/05/22 0633 10/06/22 0619  AST 424*  --  844*  --  607* 284*  --  138*  ALT 174*  --  310*  --  288* 198*  --  139*  ALKPHOS 277*  --  685*  --  767* 631*  --  512*  BILITOT 1.6*  --  2.5*  --  1.6* 1.9*  --  1.7*  PROT 5.2*  --  4.7*  --  4.8* 5.2*  --  4.9*  ALBUMIN 1.9*   < > 1.9*  1.9* 1.9*  1.8* 2.0* 1.9* 1.7*   < > = values in this interval not displayed.   Recent Labs  Lab 10/04/22 0358  LIPASE 29    No results for input(s): "AMMONIA" in the last 168 hours.  CBC: Recent Labs  Lab 10/02/22 0423 10/03/22 0408 10/04/22 0357 10/05/22 0631 10/06/22 0619  WBC 9.0 11.8* 15.1* 17.7* 19.4*  HGB 10.3* 11.4* 11.2* 11.1* 10.2*  HCT 28.7* 31.8* 31.1* 31.5* 29.2*  MCV 77.2* 75.7* 76.6* 78.6* 80.7  PLT 187 206 196 201 213    Cardiac Enzymes: No results for input(s): "CKTOTAL", "CKMB", "CKMBINDEX", "TROPONINI" in the last 168 hours.  BNP: Invalid input(s): "POCBNP"  CBG: Recent Labs  Lab 10/05/22 0741 10/05/22 1130 10/05/22 1520 10/05/22 2310 10/06/22 0726  GLUCAP 121* 145* 147* 171* 133*    Microbiology: Results for orders placed or performed during the hospital encounter of 09/26/22  MRSA Next Gen by PCR, Nasal     Status: None   Collection Time: 09/28/22  4:12 PM   Specimen: Nasal Mucosa; Nasal Swab  Result Value Ref Range Status   MRSA  by PCR Next Gen NOT DETECTED NOT DETECTED Final    Comment: (NOTE) The GeneXpert MRSA Assay (FDA approved for NASAL specimens only), is one component of a comprehensive MRSA colonization surveillance program. It is not intended to diagnose MRSA infection nor to guide or monitor treatment for MRSA infections. Test performance is not FDA approved in patients less than 37 years old. Performed at [facility], [address] [place], [place] [address]   Aerobic/Anaerobic Culture w Gram Stain (surgical/deep wound)     Status: None   Collection Time: 09/30/22 12:51 PM   Specimen: Gallbladder; Abscess  Result Value Ref Range Status   Specimen Description   Final    GALL BLADDER Performed at [facility], [address], [place], [place] [address]    Special Requests   Final    NONE Performed at [facility], [address] [place], [place] [address]    Gram Stain   Final    NO WBC SEEN FEW GRAM VARIABLE ROD RARE GRAM POSITIVE COCCI IN PAIRS    Culture   Final    RARE CITROBACTER FREUNDII RARE MORGANELLA MORGANII ABUNDANT ENTEROCOCCUS FAECALIS NO ANAEROBES ISOLATED Performed at [facility], [address], [place], [place] [address]    Report Status 10/05/2022 FINAL  Final   Organism ID, Bacteria CITROBACTER FREUNDII  Final   Organism ID, Bacteria MORGANELLA MORGANII  Final   Organism ID, Bacteria ENTEROCOCCUS FAECALIS  Final      Susceptibility   Citrobacter freundii - MIC*    CEFEPIME <=0.12 SENSITIVE Sensitive     CEFTAZIDIME <=1 SENSITIVE Sensitive     CEFTRIAXONE <=0.25 SENSITIVE Sensitive     CIPROFLOXACIN <=0.25 SENSITIVE Sensitive     GENTAMICIN <=1 SENSITIVE Sensitive     IMIPENEM <=0.25 SENSITIVE Sensitive     TRIMETH/SULFA <=20 SENSITIVE Sensitive     PIP/TAZO <=4 SENSITIVE Sensitive     * RARE CITROBACTER FREUNDII   Enterococcus faecalis - MIC*    AMPICILLIN <=2 SENSITIVE Sensitive     VANCOMYCIN 2 SENSITIVE Sensitive      GENTAMICIN SYNERGY SENSITIVE Sensitive     * ABUNDANT ENTEROCOCCUS FAECALIS   Morganella morganii - MIC*    AMPICILLIN >=32 RESISTANT Resistant     CEFTAZIDIME <=1 SENSITIVE Sensitive     CIPROFLOXACIN <=0.25 SENSITIVE Sensitive     GENTAMICIN <=  1 SENSITIVE Sensitive     IMIPENEM 4 SENSITIVE Sensitive     TRIMETH/SULFA <=20 SENSITIVE Sensitive     AMPICILLIN/SULBACTAM >=32 RESISTANT Resistant     PIP/TAZO <=4 SENSITIVE Sensitive     * RARE MORGANELLA MORGANII  Culture, blood (Routine X 2) w Reflex to ID Panel     Status: None (Preliminary result)   Collection Time: 10/05/22  6:31 AM   Specimen: BLOOD LEFT HAND  Result Value Ref Range Status   Specimen Description BLOOD LEFT HAND  Final   Special Requests   Final    BOTTLES DRAWN AEROBIC AND ANAEROBIC Blood Culture results may not be optimal due to an inadequate volume of blood received in culture bottles   Culture   Final    NO GROWTH 1 DAY Performed at [facility], [address], [place], [place] [address]    Report Status PENDING  Incomplete  Culture, blood (Routine X 2) w Reflex to ID Panel     Status: None (Preliminary result)   Collection Time: 10/05/22  6:31 AM   Specimen: BLOOD RIGHT HAND  Result Value Ref Range Status   Specimen Description BLOOD RIGHT HAND  Final   Special Requests   Final    BOTTLES DRAWN AEROBIC AND ANAEROBIC Blood Culture adequate volume   Culture   Final    NO GROWTH 1 DAY Performed at [facility], [address] [place], [place] [address]    Report Status PENDING  Incomplete    Coagulation Studies: No results for input(s): "LABPROT", "INR" in the last 72 hours.   Urinalysis: No results for input(s): "COLORURINE", "LABSPEC", "PHURINE", "GLUCOSEU", "HGBUR", "BILIRUBINUR", "KETONESUR", "PROTEINUR", "UROBILINOGEN", "NITRITE", "LEUKOCYTESUR" in the last 72 hours.  Invalid input(s): "APPERANCEUR"     Imaging: CT ABDOMEN PELVIS WO CONTRAST  Result Date:  10/05/2022 CLINICAL DATA:  Ascites. Recent diagnosis of pancreatic cancer and biliary obstruction status post common bile duct stent placement. A facet [place]/structured acute cholecystitis. EXAM: CT ABDOMEN AND PELVIS WITHOUT CONTRAST TECHNIQUE: Multidetector CT imaging of the abdomen and pelvis was performed following the standard protocol without IV contrast. RADIATION DOSE REDUCTION: This exam was performed according to the departmental dose-optimization program which includes automated exposure control, adjustment of the mA and/or kV according to patient size and/or use of iterative reconstruction technique. COMPARISON:  CT abdomen pelvis dated 09/29/2022. FINDINGS: Evaluation of this exam is limited in the absence of intravenous contrast. Lower chest: Partially visualized small bilateral pleural effusions and consolidative changes of the majority of the visualized lower lobes with air bronchograms which may represent atelectasis or pneumonia. A 5 mm nodule in the lingula as well as a 6 mm left lower lobe nodule. Small pockets of pneumoperitoneum in the upper abdomen likely introduced via drainage catheter placement. There is a small ascites. Hepatobiliary: Hypodense mass involving the caudate lobe of the liver measure up to 7.4 cm in keeping with metastatic disease. Additional ill-defined hypodense lesions within the liver also consistent with metastatic disease. There is mild dilatation and pneumobilia. The gallbladder is poorly visualized. There is air in the gallbladder in keeping with emphysematous cholecystitis. A percutaneous cholecystostomy with pigtail tip in the body of the gallbladder noted. There is debris within the gallbladder. Pancreas: Ill-defined mass at the head of the pancreas measuring 6.5 x 7.3 cm in keeping with known malignancy. This mass appears to encase the common bile duct. A common bowel duct stent is noted. There is air in the proximal lumen of the stent.  There is debris or  infiltrative mass in the distal lumen of the stent. Spleen: Normal in size without focal abnormality. Adrenals/Urinary Tract: The adrenal glands are unremarkable. Mild bilateral hydronephrosis versus parapelvic cysts. No renal calculi noted. The visualized ureters appear unremarkable. The urinary bladder is decompressed around a Foley catheter. Stomach/Bowel: There is sigmoid diverticulosis without active inflammatory changes. There is no bowel obstruction. The appendix is suboptimally visualized but grossly unremarkable. Vascular/Lymphatic: Mild aortoiliac atherosclerotic disease. The IVC is unremarkable. No portal venous gas. Retroperitoneal adenopathy including a 2 cm retrocaval lymph node. Reproductive: The prostate and seminal vesicles are grossly unremarkable. Other: Small fat containing bilateral inguinal hernia. There is diffuse subcutaneous edema. Musculoskeletal: No acute osseous pathology. IMPRESSION: 1. 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 2. Pancreatic head mass in keeping with known malignancy. 3. Hepatic metastatic disease. 4. Retroperitoneal adenopathy. 5. Small ascites. 6. Small bilateral pleural effusions and bibasilar atelectasis or infiltrate. 7. Aortic Atherosclerosis (ICD10-I70.0) and Emphysema (ICD10-J43.9). Electronically Signed   By: Elgie Collard M.D.   On: 10/05/2022 19:23     Medications:    sodium chloride Stopped (10/05/22 1622)   amiodarone 30 mg/hr (10/06/22 0718)   heparin 1,400 Units/hr (10/06/22 0600)   meropenem (MERREM) IV     promethazine (PHENERGAN) injection (IM or IVPB)      atorvastatin  40 mg Oral q morning   Chlorhexidine Gluconate Cloth  6 each Topical QHS   docusate sodium  100 mg Oral BID   feeding supplement  237 mL Oral TID BM   insulin aspart  0-9 Units Subcutaneous Q4H   midodrine  10 mg Oral TID WC   multivitamin with minerals  1 tablet Oral Daily   sodium chloride flush  5 mL Intracatheter Q8H   fentaNYL  (SUBLIMAZE) injection, levalbuterol, metoprolol tartrate, ondansetron **OR** ondansetron (ZOFRAN) IV, oxyCODONE, polyethylene glycol, promethazine (PHENERGAN) injection (IM or IVPB), traMADol, traZODone  Assessment/ Plan:  Cameron Gutierrez is a 77 y.o.  male  with atrial fibrillation, pancreatic cancer, diabetes mellitus type II, hyperlipidemia, hypertension, who was admitted to [facility] on 09/26/2022 for Generalized abdominal pain [R10.84] Adenocarcinoma of pancreas (HCC) [C25.9] Primary pancreatic cancer with metastasis to other site [facility]) [C25.9]  Nephrology consulted for acute kidney injury.    Acute Kidney Injury: baseline creatinine of 0.97 with GFR >60 on admission, 7/13. VI contrast exposure on 7/13. Suspect IV contrast induced nephropathy plus prerenal azotemia. - creatinine stable today with adequate urine output, 1.28 L. - holding home dose of lisinopril -Hypotension well managed with midodrine   A-fib.  RVR causing hypotension and hemodynamic instability.  Cardiology team on board.  Heparin drip in place Acalculous emphysematous cholecystitis-status post decompression.  JP drain with minimal output. 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 Diabetes type 2-hemoglobin A1c 7.0% from 09/26/2022. Hypokalemia-pharmacist assisting in replacement.  Corrected to 3.8.Marland Kitchen Hyponatremia-related to poor oral intake and AKI.  Sodium 127 today.   LOS: 9 Cameron Gutierrez 7/23/202410:41 AM

## 2022-10-07 ENCOUNTER — Inpatient Hospital Stay: Payer: Medicare Other

## 2022-10-07 DIAGNOSIS — C259 Malignant neoplasm of pancreas, unspecified: Secondary | ICD-10-CM | POA: Diagnosis not present

## 2022-10-07 DIAGNOSIS — R1084 Generalized abdominal pain: Secondary | ICD-10-CM | POA: Diagnosis not present

## 2022-10-07 DIAGNOSIS — J90 Pleural effusion, not elsewhere classified: Secondary | ICD-10-CM | POA: Diagnosis not present

## 2022-10-07 DIAGNOSIS — I4821 Permanent atrial fibrillation: Secondary | ICD-10-CM | POA: Diagnosis not present

## 2022-10-07 LAB — CBC
HCT: 28 % — ABNORMAL LOW (ref 39.0–52.0)
Hemoglobin: 10.1 g/dL — ABNORMAL LOW (ref 13.0–17.0)
MCH: 28.5 pg (ref 26.0–34.0)
MCHC: 36.1 g/dL — ABNORMAL HIGH (ref 30.0–36.0)
MCV: 78.9 fL — ABNORMAL LOW (ref 80.0–100.0)
Platelets: 232 10*3/uL (ref 150–400)
RBC: 3.55 MIL/uL — ABNORMAL LOW (ref 4.22–5.81)
RDW: 14.9 % (ref 11.5–15.5)
WBC: 18.8 10*3/uL — ABNORMAL HIGH (ref 4.0–10.5)
nRBC: 0 % (ref 0.0–0.2)

## 2022-10-07 LAB — BASIC METABOLIC PANEL
Anion gap: 6 (ref 5–15)
BUN: 35 mg/dL — ABNORMAL HIGH (ref 8–23)
CO2: 23 mmol/L (ref 22–32)
Calcium: 7.4 mg/dL — ABNORMAL LOW (ref 8.9–10.3)
Chloride: 97 mmol/L — ABNORMAL LOW (ref 98–111)
Creatinine, Ser: 1.47 mg/dL — ABNORMAL HIGH (ref 0.61–1.24)
GFR, Estimated: 49 mL/min — ABNORMAL LOW (ref >60)
Glucose, Bld: 172 mg/dL — ABNORMAL HIGH (ref 70–99)
Potassium: 3.5 mmol/L (ref 3.5–5.1)
Sodium: 126 mmol/L — ABNORMAL LOW (ref 135–145)

## 2022-10-07 LAB — MAGNESIUM: Magnesium: 2.2 mg/dL (ref 1.7–2.4)

## 2022-10-07 LAB — GLUCOSE, CAPILLARY
Glucose-Capillary: 135 mg/dL — ABNORMAL HIGH (ref 70–99)
Glucose-Capillary: 149 mg/dL — ABNORMAL HIGH (ref 70–99)
Glucose-Capillary: 157 mg/dL — ABNORMAL HIGH (ref 70–99)
Glucose-Capillary: 158 mg/dL — ABNORMAL HIGH (ref 70–99)
Glucose-Capillary: 168 mg/dL — ABNORMAL HIGH (ref 70–99)
Glucose-Capillary: 181 mg/dL — ABNORMAL HIGH (ref 70–99)

## 2022-10-07 LAB — CULTURE, BLOOD (ROUTINE X 2)

## 2022-10-07 LAB — HEPARIN LEVEL (UNFRACTIONATED): Heparin Unfractionated: 0.27 IU/mL — ABNORMAL LOW (ref 0.30–0.70)

## 2022-10-07 LAB — PHOSPHORUS: Phosphorus: 2.7 mg/dL (ref 2.5–4.6)

## 2022-10-07 MED ORDER — AMIODARONE HCL 200 MG PO TABS
200.0000 mg | ORAL_TABLET | Freq: Two times a day (BID) | ORAL | Status: DC
  Administered 2022-10-07 – 2022-10-12 (×12): 200 mg via ORAL
  Filled 2022-10-07 (×12): qty 1

## 2022-10-07 MED ORDER — ALUM & MAG HYDROXIDE-SIMETH 200-200-20 MG/5ML PO SUSP
30.0000 mL | ORAL | Status: DC | PRN
  Administered 2022-10-07: 30 mL via ORAL
  Filled 2022-10-07 (×2): qty 30

## 2022-10-07 MED ORDER — PROSOURCE TF20 ENFIT COMPATIBL EN LIQD
60.0000 mL | Freq: Every day | ENTERAL | Status: DC
  Administered 2022-10-08 – 2022-10-11 (×4): 60 mL
  Filled 2022-10-07 (×6): qty 60

## 2022-10-07 MED ORDER — MIRTAZAPINE 15 MG PO TABS
15.0000 mg | ORAL_TABLET | Freq: Every day | ORAL | Status: DC
  Administered 2022-10-07 – 2022-10-12 (×6): 15 mg via ORAL
  Filled 2022-10-07 (×6): qty 1

## 2022-10-07 MED ORDER — POTASSIUM CHLORIDE CRYS ER 20 MEQ PO TBCR
40.0000 meq | EXTENDED_RELEASE_TABLET | Freq: Once | ORAL | Status: AC
  Administered 2022-10-07: 40 meq via ORAL
  Filled 2022-10-07: qty 2

## 2022-10-07 MED ORDER — HEPARIN BOLUS VIA INFUSION
1100.0000 [IU] | Freq: Once | INTRAVENOUS | Status: AC
  Administered 2022-10-07: 1100 [IU] via INTRAVENOUS
  Filled 2022-10-07: qty 1100

## 2022-10-07 MED ORDER — APIXABAN 5 MG PO TABS
5.0000 mg | ORAL_TABLET | Freq: Two times a day (BID) | ORAL | Status: DC
  Administered 2022-10-07 – 2022-10-12 (×12): 5 mg via ORAL
  Filled 2022-10-07 (×12): qty 1

## 2022-10-07 MED ORDER — FREE WATER
30.0000 mL | Status: DC
  Administered 2022-10-07 – 2022-10-13 (×29): 30 mL

## 2022-10-07 MED ORDER — VITAL 1.5 CAL PO LIQD
1000.0000 mL | ORAL | Status: DC
  Administered 2022-10-07 – 2022-10-09 (×2): 1000 mL

## 2022-10-07 NOTE — Progress Notes (Signed)
Occupational Therapy Treatment Patient Details Name: Cameron Gutierrez MRN: [id number] DOB: Jun 21, 1945 Today's Date: 10/07/2022   History of present illness 77 yo male who presented to [facility] ER on 07/13 with acute onset of severe abdominal/chest pain.  He was previously admitted at [facility] from 09/21/22-09/25/22 for abdominal pain along with unintentional weight loss.  During hospitalization at [facility] workup concerning for primary metastatic pancreatic adenocarcinoma.  He underwent ERCP/US with biopsy and stent placed on 09/23/22.  His pain was controlled post procedure, and pt discharged home on 09/25/22   OT comments  Upon entering the room, pt supine in bed and agreeable to OT intervention. Pt seated in recliner chair with family present in room. Pt does report feeling comfortable, wanting to stay in chair, and feeling "wore out". Pt declines standing attempts or assistance to return to bed. Pt's R UE appears to be weeping, RN notified, and pt reports UE's feeling very heavy. AROM for B UEs reps of 10 with RR increasing to 30's with activity. Pt also performing ankle pumps and glute squeezes during session. Incentive spirometer having been placed on tray table with OT educating pt on how to utilize and he performs x 5 reps with assistance to hold at mouth secondary to fatigue. Call bell and all needed items within reach upon exiting the room.    Recommendations for follow up therapy are one component of a multi-disciplinary discharge planning process, led by the attending physician.  Recommendations may be updated based on patient status, additional functional criteria and insurance authorization.    Assistance Recommended at Discharge Intermittent Supervision/Assistance  Patient can return home with the following  A lot of help with walking and/or transfers;A lot of help with bathing/dressing/bathroom;Assistance with cooking/housework;Assist for transportation;Help with stairs or ramp for entrance    Equipment Recommendations  Other (comment) (defer to next venue of care)       Precautions / Restrictions Precautions Precautions: Fall Restrictions Weight Bearing Restrictions: No       Mobility Bed Mobility               General bed mobility comments: Pt seated in recliner chair    Transfers                   General transfer comment: Pt refuses attempts         ADL either performed or assessed with clinical judgement   ADL Overall ADL's : Needs assistance/impaired                                            Extremity/Trunk Assessment Upper Extremity Assessment Upper Extremity Assessment: Generalized weakness   Lower Extremity Assessment Lower Extremity Assessment: Generalized weakness        Vision Patient Visual Report: No change from baseline            Cognition Arousal/Alertness: Awake/alert Behavior During Therapy: Flat affect, WFL for tasks assessed/performed Overall Cognitive Status: Within Functional Limits for tasks assessed                                 General Comments: Pt is pleasant throughout session and follows commands with increased time to process                   Pertinent Vitals/ Pain  Pain Assessment Pain Assessment: No/denies pain         Frequency  Min 1X/week        Progress Toward Goals  OT Goals(current goals can now be found in the care plan section)  Progress towards OT goals: Progressing toward goals     Plan Discharge plan remains appropriate;Frequency remains appropriate    Co-evaluation        PT goals addressed during session: Mobility/safety with mobility;Balance;Proper use of DME;Strengthening/ROM        AM-PAC OT "6 Clicks" Daily Activity     Outcome Measure   Help from another person eating meals?: None Help from another person taking care of personal grooming?: A Little Help from another person toileting, which includes using  toliet, bedpan, or urinal?: A Lot Help from another person bathing (including washing, rinsing, drying)?: A Lot Help from another person to put on and taking off regular upper body clothing?: A Little Help from another person to put on and taking off regular lower body clothing?: A Lot 6 Click Score: 16    End of Session Equipment Utilized During Treatment: Oxygen  OT Visit Diagnosis: Unsteadiness on feet (R26.81);Muscle weakness (generalized) (M62.81)   Activity Tolerance Patient limited by fatigue   Patient Left in bed;with call bell/phone within reach;with bed alarm set;with family/visitor present   Nurse Communication Mobility status        Time: 2536-6440 OT Time Calculation (min): 16 min  Charges: OT General Charges $OT Visit: 1 Visit OT Treatments $Therapeutic Exercise: 8-22 mins  Jackquline Denmark, MS, OTR/L , CBIS ascom [phone]  10/07/22, 1:45 PM

## 2022-10-07 NOTE — Progress Notes (Signed)
Progress Note  Patient Name: Cameron Gutierrez Date of Encounter: 10/07/2022  Primary Cardiologist: New - consult by Kirke Corin   Subjective   Asked to re-evaluate. Remains on amiodarone and heparin gtts. In Afib in the 80s bpm. No chest pain, progressive dyspnea or palpitations. BP soft, though stable in the low 90s to low 100s mmHg systolic. Receiving midodrine 10 mg tid.   Inpatient Medications    Scheduled Meds:  atorvastatin  40 mg Oral q morning   Chlorhexidine Gluconate Cloth  6 each Topical QHS   docusate sodium  100 mg Oral BID   feeding supplement  237 mL Oral TID BM   insulin aspart  0-9 Units Subcutaneous Q4H   midodrine  10 mg Oral TID WC   mirtazapine  15 mg Oral QHS   multivitamin with minerals  1 tablet Oral Daily   mouth rinse  15 mL Mouth Rinse 4 times per day   potassium chloride  40 mEq Oral Once   sodium chloride flush  5 mL Intracatheter Q8H   Continuous Infusions:  sodium chloride Stopped (10/05/22 1622)   amiodarone 30 mg/hr (10/07/22 0600)   heparin 1,550 Units/hr (10/07/22 0639)   meropenem (MERREM) IV Stopped (10/06/22 2201)   promethazine (PHENERGAN) injection (IM or IVPB)     PRN Meds: alum & mag hydroxide-simeth, fentaNYL (SUBLIMAZE) injection, levalbuterol, metoprolol tartrate, ondansetron **OR** ondansetron (ZOFRAN) IV, mouth rinse, oxyCODONE, polyethylene glycol, promethazine (PHENERGAN) injection (IM or IVPB), traMADol, traZODone   Vital Signs    Vitals:   10/07/22 0500 10/07/22 0600 10/07/22 0700 10/07/22 0800  BP: 103/77 94/63 105/66 93/73  Pulse: 75 78 77 79  Resp: 16 18 (!) 24 14  Temp:    97.9 F (36.6 C)  TempSrc:    Oral  SpO2: 100% 100% 100% 100%  Weight: 88.8 kg     Height:        Intake/Output Summary (Last 24 hours) at 10/07/2022 0908 Last data filed at 10/07/2022 0600 Gross per 24 hour  Intake 978.21 ml  Output 2285 ml  Net -1306.79 ml   Filed Weights   10/04/22 0500 10/06/22 0500 10/07/22 0500  Weight: 85.8 kg 89.8  kg 88.8 kg    Telemetry    Afib, 80s bpm - Personally Reviewed  ECG    No new tracings - Personally Reviewed  Physical Exam   GEN: No acute distress. Chronically ill appearing.    Neck: No JVD. Cardiac: IRIR, no murmurs, rubs, or gallops.  Respiratory: Mildly diminished breath sounds bilaterally.  GI: Soft, nontender, non-distended.   MS: No edema; No deformity. Neuro:  Alert and oriented x 3; Nonfocal.  Psych: Normal affect.  Labs    Chemistry Recent Labs  Lab 10/04/22 0358 10/05/22 0631 10/05/22 0633 10/06/22 0619 10/07/22 0556  NA 129* 130* 129* 127* 126*  K 3.2* 3.4* 3.3* 3.8 3.5  CL 100 99 99 96* 97*  CO2 20* 20* 19* 22 23  GLUCOSE 133* 120* 119* 142* 172*  BUN 58* 43* 48* 44* 35*  CREATININE 2.04* 1.71* 1.65* 1.60* 1.47*  CALCIUM 7.1* 7.4* 7.3* 7.2* 7.4*  PROT 4.8* 5.2*  --  4.9*  --   ALBUMIN 1.9*  1.8* 2.0* 1.9* 1.7*  --   AST 607* 284*  --  138*  --   ALT 288* 198*  --  139*  --   ALKPHOS 767* 631*  --  512*  --   BILITOT 1.6* 1.9*  --  1.7*  --  GFRNONAA 33* 41* 43* 45* 49*  ANIONGAP 9 11 11 9 6      Hematology Recent Labs  Lab 10/05/22 0631 10/06/22 0619 10/07/22 0556  WBC 17.7* 19.4* 18.8*  RBC 4.01* 3.62* 3.55*  HGB 11.1* 10.2* 10.1*  HCT 31.5* 29.2* 28.0*  MCV 78.6* 80.7 78.9*  MCH 27.7 28.2 28.5  MCHC 35.2 34.9 36.1*  RDW 14.3 14.7 14.9  PLT 201 213 232    Cardiac EnzymesNo results for input(s): "TROPONINI" in the last 168 hours. No results for input(s): "TROPIPOC" in the last 168 hours.   BNPNo results for input(s): "BNP", "PROBNP" in the last 168 hours.   DDimer No results for input(s): "DDIMER" in the last 168 hours.   Radiology    CT ABDOMEN PELVIS WO CONTRAST  Result Date: 10/05/2022 IMPRESSION: 1. 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 2. Pancreatic head mass in keeping with known malignancy. 3. Hepatic metastatic disease. 4. Retroperitoneal adenopathy. 5. Small ascites. 6. Small bilateral  pleural effusions and bibasilar atelectasis or infiltrate. 7. Aortic Atherosclerosis (ICD10-I70.0) and Emphysema (ICD10-J43.9). Electronically Signed   By: Elgie Collard M.D.   On: 10/05/2022 19:23    Cardiac Studies   2D echo 09/29/22: 1. Left ventricular ejection fraction, by estimation, is 60 to 65%. The  left ventricle has normal function. The left ventricle has no regional  wall motion abnormalities. There is mild asymmetric left ventricular  hypertrophy of the basal-septal segment.  Left ventricular diastolic function could not be evaluated.   2. Pulmonary artery pressure is mildly elevated (RVSP 30-35 mmHg plus  central venous/right atrial pressure). Right ventricular systolic function  is normal. The right ventricular size is normal.   3. Moderate pleural effusion in the right lateral region.   4. The mitral valve is normal in structure. Mild to moderate mitral valve  regurgitation.   5. Tricuspid valve regurgitation is mild to moderate.   6. The aortic valve is tricuspid. Aortic valve regurgitation is mild. No  aortic stenosis is present.   Patient Profile     77 y.o. male with history of adult onset type 1 diabetes, hypertension, CKD stage II, hyperlipidemia, atrial fibrillation of uncertain chronicity on chronic anticoagulation with apixaban, recently diagnosed metastatic pancreatic adenocarcinoma, who is being seen 09/29/2022 for the evaluation of atrial fibrillation.  Assessment & Plan    1. Afib: -Presented to the [facility] emergency department 7/13 with EKG revealing rate controlled atrial fibrillation -During procedure on 7/15, he went into A-fib RVR with rates of 116 bpm, procedure had to be canceled -Likely multifactorial from sepsis, cholecystitis, metastatic adenocarcinoma of the pancreas, hypotension, and multiorgan failure  -Echo with preserved LV systolic function and normal wall motion  -Chronicity uncertain  -Ventricular rates improved in the 80s bpm  currently -While not ideal, he has received several days of IV amiodarone for rate control (no other options) given hypotension requiring vasopressor support and AKI limiting beta blocker, calcium channel blocker, or digoxin -Per discussion with MD, stop IV amiodarone, start oral amiodarone 200 mg bid with transition to 200 mg daily in 1 week -If renal function or BP improve, would look to stop oral amiodarone and start beta blocker or digoxin -Stop heparin gtt -Start Eliquis 5 mg bid -With noted comorbid conditions, no current plans to pursue rhythm control  at this time  2. Acute hypoxic respiratory failure: -Felt to be secondary to bilateral pleural effusions and atelectasis -Status post right-sided thoracentesis this admission with 900 cc removed -Stable on supplemental oxygen  via nasal cannula at 3 L -Diurese as needed      For questions or updates, please contact [facility] Please consult [email or web address] for contact info under Cardiology/STEMI.    Signed, Eula Listen, PA-C [facility] Pager: [phone] 10/07/2022, 9:08 AM

## 2022-10-07 NOTE — Progress Notes (Signed)
[facility]  ROUNDING NOTE   Subjective:   77 yo male who presented to [facility] ER on 07/13 with acute onset of severe abdominal/chest pain.  He was previously admitted at [facility] from 09/21/22-09/25/22 for abdominal pain along with unintentional weight loss.  During hospitalization at [facility] workup concerning for primary metastatic pancreatic adenocarcinoma.  He underwent ERCP/US with biopsy and stent placed on 09/23/22.  Patient seen and evaluated bedside in ICU Wife at bedside Alert and oriented Was able to eat small amount of eggs and sausage for breakfast  3 L nasal cannula Lower extremity edema persist  Heparin drip Amiodarone drip in place  Objective:  Vital signs in last 24 hours:  Temp:  [97.6 F (36.4 C)-97.9 F (36.6 C)] 97.9 F (36.6 C) (07/24 0800) Pulse Rate:  [70-85] 77 (07/24 1100) Resp:  [14-24] 23 (07/24 1100) BP: (93-116)/(52-83) 99/71 (07/24 1100) SpO2:  [94 %-100 %] 100 % (07/24 1100) Weight:  [88.8 kg] 88.8 kg (07/24 0500)  Weight change: -1 kg Filed Weights   10/04/22 0500 10/06/22 0500 10/07/22 0500  Weight: 85.8 kg 89.8 kg 88.8 kg    Intake/Output: I/O last 3 completed shifts: In: 1356 [I.V.:1090.1; Other:10; IV Piggyback:255.9] Out: 2975 [Urine:2830; Drains:145]   Intake/Output this shift:  No intake/output data recorded.  Physical Exam: General: NAD, cachectic, laying in bed  Head: Normocephalic, atraumatic. Moist oral mucosal membranes  Eyes: Anicteric,  Lungs:  [place] O2, normal breathing effort.  Heart: Irregular rhythm  Abdomen:  Soft, +epigastric tenderness, RUQ drain  Extremities:  Has some dependent peripheral edema.  Neurologic: Alert, able to follow simple commands.  Skin: No lesions  Access: none    Basic Metabolic Panel: Recent Labs  Lab 10/02/22 0423 10/03/22 0406 10/03/22 0408 10/04/22 1610 10/04/22 9604 10/05/22 0631 10/05/22 5409 10/06/22 0619 10/07/22 0556  NA 133*  --  132*   < > 129* 130* 129*  127* 126*  K 3.0*  --  2.9*   < > 3.2* 3.4* 3.3* 3.8 3.5  CL 100  --  101   < > 100 99 99 96* 97*  CO2 22  --  20*   < > 20* 20* 19* 22 23  GLUCOSE 119*  --  144*   < > 133* 120* 119* 142* 172*  BUN 76*  --  65*   < > 58* 43* 48* 44* 35*  CREATININE 2.49*  --  2.16*   < > 2.04* 1.71* 1.65* 1.60* 1.47*  CALCIUM 6.9*  --  7.2*   < > 7.1* 7.4* 7.3* 7.2* 7.4*  MG 2.4 2.5*  --   --   --  2.4  --  2.4 2.2  PHOS 4.4  --  3.7  --  3.3  --  3.0 3.5 2.7   < > = values in this interval not displayed.    Liver Function Tests: Recent Labs  Lab 10/02/22 1044 10/03/22 0408 10/04/22 0358 10/05/22 0631 10/05/22 0633 10/06/22 0619  AST 844*  --  607* 284*  --  138*  ALT 310*  --  288* 198*  --  139*  ALKPHOS 685*  --  767* 631*  --  512*  BILITOT 2.5*  --  1.6* 1.9*  --  1.7*  PROT 4.7*  --  4.8* 5.2*  --  4.9*  ALBUMIN 1.9* 1.9* 1.9*  1.8* 2.0* 1.9* 1.7*   Recent Labs  Lab 10/04/22 0358  LIPASE 29    No results for input(s): "  AMMONIA" in the last 168 hours.  CBC: Recent Labs  Lab 10/03/22 0408 10/04/22 0357 10/05/22 0631 10/06/22 0619 10/07/22 0556  WBC 11.8* 15.1* 17.7* 19.4* 18.8*  HGB 11.4* 11.2* 11.1* 10.2* 10.1*  HCT 31.8* 31.1* 31.5* 29.2* 28.0*  MCV 75.7* 76.6* 78.6* 80.7 78.9*  PLT 206 196 201 213 232    Cardiac Enzymes: No results for input(s): "CKTOTAL", "CKMB", "CKMBINDEX", "TROPONINI" in the last 168 hours.  BNP: Invalid input(s): "POCBNP"  CBG: Recent Labs  Lab 10/06/22 1126 10/06/22 1523 10/06/22 1943 10/07/22 0011 10/07/22 0735  GLUCAP 167* 160* 175* 149* 135*    Microbiology: Results for orders placed or performed during the hospital encounter of 09/26/22  MRSA Next Gen by PCR, Nasal     Status: None   Collection Time: 09/28/22  4:12 PM   Specimen: Nasal Mucosa; Nasal Swab  Result Value Ref Range Status   MRSA by PCR Next Gen NOT DETECTED NOT DETECTED Final    Comment: (NOTE) The GeneXpert MRSA Assay (FDA approved for NASAL specimens  only), is one component of a comprehensive MRSA colonization surveillance program. It is not intended to diagnose MRSA infection nor to guide or monitor treatment for MRSA infections. Test performance is not FDA approved in patients less than 35 years old. Performed at [facility], [address] [place], [place] [address]   Aerobic/Anaerobic Culture w Gram Stain (surgical/deep wound)     Status: None   Collection Time: 09/30/22 12:51 PM   Specimen: Gallbladder; Abscess  Result Value Ref Range Status   Specimen Description   Final    GALL BLADDER Performed at [facility], [address] [place], [place] [address]    Special Requests   Final    NONE Performed at [facility], [address] [place], [place] [address]    Gram Stain   Final    NO WBC SEEN FEW GRAM VARIABLE ROD RARE GRAM POSITIVE COCCI IN PAIRS    Culture   Final    RARE CITROBACTER FREUNDII RARE MORGANELLA MORGANII ABUNDANT ENTEROCOCCUS FAECALIS NO ANAEROBES ISOLATED Performed at [facility], [address] [place], [place] [address]    Report Status 10/05/2022 FINAL  Final   Organism ID, Bacteria CITROBACTER FREUNDII  Final   Organism ID, Bacteria MORGANELLA MORGANII  Final   Organism ID, Bacteria ENTEROCOCCUS FAECALIS  Final      Susceptibility   Citrobacter freundii - MIC*    CEFEPIME <=0.12 SENSITIVE Sensitive     CEFTAZIDIME <=1 SENSITIVE Sensitive     CEFTRIAXONE <=0.25 SENSITIVE Sensitive     CIPROFLOXACIN <=0.25 SENSITIVE Sensitive     GENTAMICIN <=1 SENSITIVE Sensitive     IMIPENEM <=0.25 SENSITIVE Sensitive     TRIMETH/SULFA <=20 SENSITIVE Sensitive     PIP/TAZO <=4 SENSITIVE Sensitive     * RARE CITROBACTER FREUNDII   Enterococcus faecalis - MIC*    AMPICILLIN <=2 SENSITIVE Sensitive     VANCOMYCIN 2 SENSITIVE Sensitive     GENTAMICIN SYNERGY SENSITIVE Sensitive     * ABUNDANT ENTEROCOCCUS FAECALIS   Morganella morganii - MIC*    AMPICILLIN >=32  RESISTANT Resistant     CEFTAZIDIME <=1 SENSITIVE Sensitive     CIPROFLOXACIN <=0.25 SENSITIVE Sensitive     GENTAMICIN <=1 SENSITIVE Sensitive     IMIPENEM 4 SENSITIVE Sensitive     TRIMETH/SULFA <=20 SENSITIVE Sensitive     AMPICILLIN/SULBACTAM >=32 RESISTANT Resistant     PIP/TAZO <=4 SENSITIVE Sensitive     *  RARE MORGANELLA MORGANII  Culture, blood (Routine X 2) w Reflex to ID Panel     Status: None (Preliminary result)   Collection Time: 10/05/22  6:31 AM   Specimen: BLOOD LEFT HAND  Result Value Ref Range Status   Specimen Description BLOOD LEFT HAND  Final   Special Requests   Final    BOTTLES DRAWN AEROBIC AND ANAEROBIC Blood Culture results may not be optimal due to an inadequate volume of blood received in culture bottles   Culture   Final    NO GROWTH 2 DAYS Performed at [facility], [address] [place], [place] [address]    Report Status PENDING  Incomplete  Culture, blood (Routine X 2) w Reflex to ID Panel     Status: None (Preliminary result)   Collection Time: 10/05/22  6:31 AM   Specimen: BLOOD RIGHT HAND  Result Value Ref Range Status   Specimen Description BLOOD RIGHT HAND  Final   Special Requests   Final    BOTTLES DRAWN AEROBIC AND ANAEROBIC Blood Culture adequate volume   Culture   Final    NO GROWTH 2 DAYS Performed at [facility], [address] [place], [place] [address]    Report Status PENDING  Incomplete    Coagulation Studies: No results for input(s): "LABPROT", "INR" in the last 72 hours.   Urinalysis: No results for input(s): "COLORURINE", "LABSPEC", "PHURINE", "GLUCOSEU", "HGBUR", "BILIRUBINUR", "KETONESUR", "PROTEINUR", "UROBILINOGEN", "NITRITE", "LEUKOCYTESUR" in the last 72 hours.  Invalid input(s): "APPERANCEUR"     Imaging: CT ABDOMEN PELVIS WO CONTRAST  Result Date: 10/05/2022 CLINICAL DATA:  Ascites. Recent diagnosis of pancreatic cancer and biliary obstruction status post common bile duct stent  placement. A facet [place]/structured acute cholecystitis. EXAM: CT ABDOMEN AND PELVIS WITHOUT CONTRAST TECHNIQUE: Multidetector CT imaging of the abdomen and pelvis was performed following the standard protocol without IV contrast. RADIATION DOSE REDUCTION: This exam was performed according to the departmental dose-optimization program which includes automated exposure control, adjustment of the mA and/or kV according to patient size and/or use of iterative reconstruction technique. COMPARISON:  CT abdomen pelvis dated 09/29/2022. FINDINGS: Evaluation of this exam is limited in the absence of intravenous contrast. Lower chest: Partially visualized small bilateral pleural effusions and consolidative changes of the majority of the visualized lower lobes with air bronchograms which may represent atelectasis or pneumonia. A 5 mm nodule in the lingula as well as a 6 mm left lower lobe nodule. Small pockets of pneumoperitoneum in the upper abdomen likely introduced via drainage catheter placement. There is a small ascites. Hepatobiliary: Hypodense mass involving the caudate lobe of the liver measure up to 7.4 cm in keeping with metastatic disease. Additional ill-defined hypodense lesions within the liver also consistent with metastatic disease. There is mild dilatation and pneumobilia. The gallbladder is poorly visualized. There is air in the gallbladder in keeping with emphysematous cholecystitis. A percutaneous cholecystostomy with pigtail tip in the body of the gallbladder noted. There is debris within the gallbladder. Pancreas: Ill-defined mass at the head of the pancreas measuring 6.5 x 7.3 cm in keeping with known malignancy. This mass appears to encase the common bile duct. A common bowel duct stent is noted. There is air in the proximal lumen of the stent. There is debris or infiltrative mass in the distal lumen of the stent. Spleen: Normal in size without focal abnormality. Adrenals/Urinary Tract: The adrenal  glands are unremarkable. Mild bilateral hydronephrosis versus parapelvic cysts. No renal calculi noted. The visualized  ureters appear unremarkable. The urinary bladder is decompressed around a Foley catheter. Stomach/Bowel: There is sigmoid diverticulosis without active inflammatory changes. There is no bowel obstruction. The appendix is suboptimally visualized but grossly unremarkable. Vascular/Lymphatic: Mild aortoiliac atherosclerotic disease. The IVC is unremarkable. No portal venous gas. Retroperitoneal adenopathy including a 2 cm retrocaval lymph node. Reproductive: The prostate and seminal vesicles are grossly unremarkable. Other: Small fat containing bilateral inguinal hernia. There is diffuse subcutaneous edema. Musculoskeletal: No acute osseous pathology. IMPRESSION: 1. 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 2. Pancreatic head mass in keeping with known malignancy. 3. Hepatic metastatic disease. 4. Retroperitoneal adenopathy. 5. Small ascites. 6. Small bilateral pleural effusions and bibasilar atelectasis or infiltrate. 7. Aortic Atherosclerosis (ICD10-I70.0) and Emphysema (ICD10-J43.9). Electronically Signed   By: Elgie Collard M.D.   On: 10/05/2022 19:23     Medications:    sodium chloride Stopped (10/05/22 1622)   meropenem (MERREM) IV 1 g (10/07/22 1034)   promethazine (PHENERGAN) injection (IM or IVPB)      amiodarone  200 mg Oral BID   apixaban  5 mg Oral BID   atorvastatin  40 mg Oral q morning   Chlorhexidine Gluconate Cloth  6 each Topical QHS   docusate sodium  100 mg Oral BID   feeding supplement  237 mL Oral TID BM   insulin aspart  0-9 Units Subcutaneous Q4H   midodrine  10 mg Oral TID WC   mirtazapine  15 mg Oral QHS   multivitamin with minerals  1 tablet Oral Daily   mouth rinse  15 mL Mouth Rinse 4 times per day   sodium chloride flush  5 mL Intracatheter Q8H   alum & mag hydroxide-simeth, fentaNYL (SUBLIMAZE) injection, levalbuterol,  metoprolol tartrate, ondansetron **OR** ondansetron (ZOFRAN) IV, mouth rinse, oxyCODONE, polyethylene glycol, promethazine (PHENERGAN) injection (IM or IVPB), traMADol, traZODone  Assessment/ Plan:  [name] is a 77 y.o.  male  with atrial fibrillation, pancreatic cancer, diabetes mellitus type II, hyperlipidemia, hypertension, who was admitted to [facility] on 09/26/2022 for Generalized abdominal pain [R10.84] Adenocarcinoma of pancreas (HCC) [C25.9] Primary pancreatic cancer with metastasis to other site [facility]) [C25.9]  Nephrology consulted for acute kidney injury.    Acute Kidney Injury: baseline creatinine of 0.97 with GFR >60 on admission, 7/13. VI contrast exposure on 7/13. Suspect IV contrast induced nephropathy plus prerenal azotemia. - creatinine slowly improving with adequate urine output, 2.2 L. - holding home dose of lisinopril -Hypotension well managed with midodrine   A-fib.  RVR causing hypotension and hemodynamic instability.  Cardiology team on board.  Heparin and amiodarone drip in place.  Will transition to oral agents, amiodarone and Eliquis  Acalculous emphysematous cholecystitis-status post decompression.  JP drain with minimal output.  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  Diabetes type 2-hemoglobin A1c 7.0% from 09/26/2022. Glucose well managed  Hypokalemia-pharmacist assisting in replacement.  Corrected  Hyponatremia-related to poor oral intake and AKI.  Sodium 126   LOS: 10 Jachin Coury 7/24/202411:22 AM

## 2022-10-07 NOTE — Progress Notes (Signed)
ANTICOAGULATION CONSULT NOTE   Pharmacy Consult for IV heparin Indication: atrial fibrillation  No Known Allergies  Patient Measurements: Height: 6' (182.9 cm) Weight: 88.8 kg (195 lb 12.3 oz) IBW/kg (Calculated) : 77.6 Heparin Dosing Weight: 73.9 kg  Vital Signs: Temp: 97.9 F (36.6 C) (07/24 0200) Temp Source: Oral (07/24 0200) BP: 109/77 (07/24 0200) Pulse Rate: 70 (07/24 0200)  Labs: Recent Labs    10/05/22 0631 10/05/22 3329 10/05/22 0838 10/06/22 0619 10/06/22 0904 10/07/22 0556  HGB 11.1*  --   --  10.2*  --  10.1*  HCT 31.5*  --   --  29.2*  --  28.0*  PLT 201  --   --  213  --  232  HEPARINUNFRC  --   --  0.45  --  0.30 0.27*  CREATININE 1.71* 1.65*  --  1.60*  --   --     Estimated Creatinine Clearance: 43.8 mL/min (A) (by C-G formula based on SCr of 1.6 mg/dL (H)).   Medical History: Past Medical History:  Diagnosis Date   Atrial fibrillation (HCC)    Cancer (HCC)    pancreatic   Diabetes mellitus without complication (HCC)    Hypercholesteremia    Hypertension     Medications:  Eliquis 5 mg BID (last dose 09/24/22)  Assessment: 77 year old male with PMH atrial fibrillation on Eliquis prior to admission. Plan was for cholecystectomy drain placement, but was cancelled due to acute Afib.   7/21 0357  HL 0.27 Subtherapeutic 7/21 1341  HL 0.28 Subtherapeutic 7/21 2350  HL 0.42 Therapeutic x1 7/22 0838 HL 0.45      Therapeutic x 2 7/23 0904 HL 0.30 Therapeutic 7//24 0556 HL 0.27 Subtherapeutic   Goal of Therapy:  HL 0.3 - 0.7  Monitor platelets by anticoagulation protocol: Yes   Plan: --Bolus 1100 units x 1 --Increase heparin infusion to 1550 units/hr --Recheck HL in 8 hr after rate change --CBC daily while on heparin.    Otelia Sergeant, PharmD, [facility] 10/07/2022 6:32 AM

## 2022-10-07 NOTE — Progress Notes (Signed)
PROGRESS NOTE    Cameron Gutierrez  MVH:[id number] DOB: Oct 27, 1945 DOA: 09/26/2022 PCP: Dondra Spry, MD    Brief Narrative:  77 yo male who presented to [facility] ER on 07/13 with acute onset of severe abdominal/chest pain.  He was previously admitted at [facility] from 09/21/22-09/25/22 for abdominal pain along with unintentional weight loss.  During hospitalization at [facility] workup concerning for primary metastatic pancreatic adenocarcinoma.  He underwent ERCP/US with biopsy and stent placed on 09/23/22.  His pain was controlled post procedure, and pt discharged home on 09/25/22 (prescribed oxycodone and eliquis resumed).  Upon arrival to [facility] ER significant lab results were: Na+ 133/K+ 3.4/glucose 148/calcium 7.4/albumin 2.2/wbc 15.7.  CT Abd Pelvis concerning for moderate distension of the gallbladder but no findings of acute cholecystitis, however pt continued to c/o persistent abdominal pain.  EDP consulted with [facility] Oncologist Dr. Everardo All, and following review of imaging did not feel pt required transfer to [facility].  Pt subsequently admitted to [facility] medsurg unit per hospitalist team for additional workup and treatment due to continued abdominal pain.    Significant Hospital events :    07/13: Pt admitted to the medsurg unit with diffuse abdominal pain following recent ERCP with stent placement and newly diagnosed primary pancreatic adenocarcinoma with metastasis  [place] Abd Limited RUQ revealed Gallbladder sludge with mild gallbladder wall thickening and small volume pericholecystic fluid. Positive sonographic Murphy sign. Findings are concerning for acute acalculous cholecystitis. Liver metastases seen on CT are not well identified by ultrasound.   07/14: General surgery consulted for acute acalculous cholecystitis and recommended decompression of the gallbladder via percutaneous cholecystostomy per IR and continue abx therapy.    07/15: Pt arrived to special recovery for cholecystostomy drain placement and  pt noted to be in atrial fibrillation with rvr heart rate 140 to 170's.  Cardiology notified and the pt received 5 mg of iv cardizem x1 dose/250 ml NS bolus/20 mg of iv lasix.  Procedure canceled and pt transferred to the stepdown unit for cardizem gtt.  PCCM team consulted to assist with management.     09/29/22 patient in no distress, met with family today.  Awaiting percutaneous cholecystostomy and thoracentesis.  Testing for TLS today.   09/30/22 patient with emphysematous cholecystitis and pneumoperitoneum.  S/p throacentesis with partial clinical improvement.  Plan for IR consultation and cholecystostomy tube, completed by IR.     10/01/22 patient remains weak and chronically ill appearing.  He is s/p GB drain placement by IR and plan today to remove NGT and start PO nourishment.  He is being seen by palliative cancer specialist.   10/02/22 patient had BM overnight no longer with bowel obstruction does have NG still. Remains in AF on heparin, but vitals improved with low dose neo now. Palliative oncology following.  Patient wishes to have cancer therapy if possible. WBC count is normalized today.   10/03/22 : As patient was off vasopressors care was transitioned to hospitalist service.  No overnight events as per nursing.  Blood pressure remained stable off pressors with few soft readings.  Improvement in renal functions.  Nephrology following.  Patient tolerating full liquid diet.  NG can come out tomorrow.   10/04/2022: The patient was seen and examined at his bedside.  His wife was present in the room.  Passed a swallow evaluation.  Okay to do soft diet.  Will repeat LFTs and lipase level.  Will closely monitor to ensure tolerance to soft diet.   10/05/22:  Tolerating a soft  diet well.  NG tube removed.  CT scan ab/p wo contrast ordered due to up-trending leukocytosis.  ID consulted to assist with the management.   10/06/22: The patient was seen and examined at his bedside.  His son was present in  the room.  His leukocytosis continues to uptrend.  Started Merrem and DC'd the Zosyn today.  Appreciate infectious disease assistance.  7/24: No acute events overnight.  Oral intake remains poor.  Discussed with SLP and RD.  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  900 cc removed.  Oxygen status improved.  Now on 2 L. Plan: Continue oxygen via nasal cannula wean as tolerated Pulmonary hygiene Encourage incentive spirometry and flutter use Bronchodilators  Acute Kidney Injury -KDIGO 3 Multifactorial including ischemic/metabolic Off IV fluid. Holding home lisinopril Monitor urine output, he has a foley in place Nephrology following   Acute urinary retention s/p foley cath placement on 09/28/22 Initially difficult to insert follow-up with urology Maintain Foley catheter for now   Permanent atrial fibrillation with rvr  Resolved hypotension secondary to antiarrhythmic medication  Patient was on amiodarone gtt. and heparin gtt. since 7/18.  Reconsulted cardiology Plan: Transition to p.o. amiodarone 200 mg twice daily and p.o. Eliquis 5 mg twice daily Transfer out of stepdown   #Acalculous emphysematous cholecystitis: Status post decompression now with JP drain in place. Uptrending leukocytosis.  Unclear etiology. Currently on meropenem Plan: Continue Merrem ID follow-up   #Primary  metastatic adenocarcinoma of the pancreas  - Oncology consulted appreciate input  Poor prognosis Patient would like to attempt to optimize nutritional status to see if he would be a candidate for any ongoing therapies   #Type I diabetes mellitus with hyperglycemia Hemoglobin A1c 7.0 on 09/26/2022. - CBG's ac/hs - SSI    Physical debility Continue PT OT with assistance and fall precautions. Fall precautions.   Moderate protein calorie malnutrition BMI 25 Moderate muscle mass loss Encourage oral intake and increasing protein calorie intake. Dobbhoff tube placed 7/24 RD consulted for tube feeds  DVT prophylaxis: Eliquis Code Status: DNR Family Communication: Spouse at bedside Disposition Plan: Status is: Inpatient Remains inpatient appropriate because: Multiple acute issues as above   Level of care: Progressive  Consultants:  ID Cardiology  Procedures:  Thoracentesis  Antimicrobials: Meropenem   Subjective: Seen and examined.  Resting in bed.  Reports eating a little bit more.  Still appears very fatigued  Objective: Vitals:   10/07/22 1200 10/07/22 1300 10/07/22 1350 10/07/22 1610  BP: 104/71 100/71 94/70 108/60  Pulse:  80 92 92  Resp: 17 (!) 26  20  Temp: 97.9 F (36.6 C)   98 F (36.7 C)  TempSrc: Oral   Oral  SpO2: 100% 100% (!) 81% 96%  Weight:      Height:        Intake/Output Summary (Last 24 hours) at 10/07/2022 1704 Last data filed at 10/07/2022 1330 Gross per 24 hour  Intake 523.98 ml  Output 2263 ml  Net -1739.02 ml   Filed Weights   10/04/22 0500 10/06/22 0500 10/07/22 0500  Weight: 85.8 kg 89.8 kg 88.8 kg    Examination:  General exam: Ill-appearing.  Fatigued Respiratory system: Scattered crackles bilaterally.  Normal work of breathing.  2 L Cardiovascular system: S1-S2, RRR, no murmur, no pedal edema Gastrointestinal system: Thin, soft, NT/ND, normal bowel sounds Central nervous system: Alert and oriented. No focal neurological  deficits. Extremities: Decreased power  Skin: No rashes, lesions or ulcers Psychiatry: Judgement and insight appear normal. Mood & affect appropriate.     Data Reviewed: I have personally reviewed following labs and imaging studies  CBC: Recent Labs  Lab 10/03/22 0408 10/04/22 0357 10/05/22 0631 10/06/22 0619 10/07/22 0556  WBC 11.8* 15.1* 17.7* 19.4* 18.8*  HGB 11.4* 11.2* 11.1* 10.2* 10.1*  HCT 31.8* 31.1* 31.5* 29.2* 28.0*  MCV 75.7* 76.6* 78.6* 80.7 78.9*  PLT 206 196 201 213 232   Basic Metabolic Panel: Recent Labs  Lab 10/02/22 0423 10/03/22 0406 10/03/22 0408 10/04/22 0357 10/04/22 0358 10/05/22 0631 10/05/22 0633 10/06/22 0619 10/07/22 0556  NA 133*  --  132*   < > 129* 130* 129* 127* 126*  K 3.0*  --  2.9*   < > 3.2* 3.4* 3.3* 3.8 3.5  CL 100  --  101   < > 100 99 99 96* 97*  CO2 22  --  20*   < > 20* 20* 19* 22 23  GLUCOSE 119*  --  144*   < > 133* 120* 119* 142* 172*  BUN 76*  --  65*   < > 58* 43* 48* 44* 35*  CREATININE 2.49*  --  2.16*   < > 2.04* 1.71* 1.65* 1.60* 1.47*  CALCIUM 6.9*  --  7.2*   < > 7.1* 7.4* 7.3* 7.2* 7.4*  MG 2.4 2.5*  --   --   --  2.4  --  2.4 2.2  PHOS 4.4  --  3.7  --  3.3  --  3.0 3.5 2.7   < > = values in this interval not displayed.   GFR: Estimated Creatinine Clearance: 47.7 mL/min (A) (by C-G formula based on SCr of 1.47 mg/dL (H)). Liver Function Tests: Recent Labs  Lab 10/02/22 1044 10/03/22 0408 10/04/22 0358 10/05/22 0631 10/05/22 0633 10/06/22 0619  AST 844*  --  607* 284*  --  138*  ALT 310*  --  288* 198*  --  139*  ALKPHOS 685*  --  767* 631*  --  512*  BILITOT 2.5*  --  1.6* 1.9*  --  1.7*  PROT 4.7*  --  4.8* 5.2*  --  4.9*  ALBUMIN 1.9* 1.9* 1.9*  1.8* 2.0* 1.9* 1.7*   Recent Labs  Lab 10/04/22 0358  LIPASE 29   No results for input(s): "AMMONIA" in the last 168 hours. Coagulation Profile: No results for input(s): "INR", "PROTIME" in the last 168 hours. Cardiac Enzymes: No results for  input(s): "CKTOTAL", "CKMB", "CKMBINDEX", "TROPONINI" in the last 168 hours. BNP (last 3 results) No results for input(s): "PROBNP" in the last 8760 hours. HbA1C: No results for input(s): "HGBA1C" in the last 72 hours. CBG: Recent Labs  Lab 10/06/22 1943 10/07/22 0011 10/07/22 0735 10/07/22 1137 10/07/22 1605  GLUCAP 175* 149* 135* 181* 158*   Lipid Profile: No results for input(s): "CHOL", "HDL", "LDLCALC", "TRIG", "CHOLHDL", "LDLDIRECT" in the last 72 hours. Thyroid Function Tests: No  results for input(s): "TSH", "T4TOTAL", "FREET4", "T3FREE", "THYROIDAB" in the last 72 hours. Anemia Panel: No results for input(s): "VITAMINB12", "FOLATE", "FERRITIN", "TIBC", "IRON", "RETICCTPCT" in the last 72 hours. Sepsis Labs: Recent Labs  Lab 10/01/22 0448 10/02/22 0423  PROCALCITON 9.04 5.95    Recent Results (from the past 240 hour(s))  MRSA Next Gen by PCR, Nasal     Status: None   Collection Time: 09/28/22  4:12 PM   Specimen: Nasal Mucosa; Nasal Swab  Result Value Ref Range Status   MRSA by PCR Next Gen NOT DETECTED NOT DETECTED Final    Comment: (NOTE) The GeneXpert MRSA Assay (FDA approved for NASAL specimens only), is one component of a comprehensive MRSA colonization surveillance program. It is not intended to diagnose MRSA infection nor to guide or monitor treatment for MRSA infections. Test performance is not FDA approved in patients less than 39 years old. Performed at [facility], [address] [place], [place] [address]   Aerobic/Anaerobic Culture w Gram Stain (surgical/deep wound)     Status: None   Collection Time: 09/30/22 12:51 PM   Specimen: Gallbladder; Abscess  Result Value Ref Range Status   Specimen Description   Final    GALL BLADDER Performed at [facility], [address] [place], [place] [address]    Special Requests   Final    NONE Performed at [facility], [address] [place], [place] [address]    Gram  Stain   Final    NO WBC SEEN FEW GRAM VARIABLE ROD RARE GRAM POSITIVE COCCI IN PAIRS    Culture   Final    RARE CITROBACTER FREUNDII RARE MORGANELLA MORGANII ABUNDANT ENTEROCOCCUS FAECALIS NO ANAEROBES ISOLATED Performed at [facility], [address] [place], [place] [address]    Report Status 10/05/2022 FINAL  Final   Organism ID, Bacteria CITROBACTER FREUNDII  Final   Organism ID, Bacteria MORGANELLA MORGANII  Final   Organism ID, Bacteria ENTEROCOCCUS FAECALIS  Final      Susceptibility   Citrobacter freundii - MIC*    CEFEPIME <=0.12 SENSITIVE Sensitive     CEFTAZIDIME <=1 SENSITIVE Sensitive     CEFTRIAXONE <=0.25 SENSITIVE Sensitive     CIPROFLOXACIN <=0.25 SENSITIVE Sensitive     GENTAMICIN <=1 SENSITIVE Sensitive     IMIPENEM <=0.25 SENSITIVE Sensitive     TRIMETH/SULFA <=20 SENSITIVE Sensitive     PIP/TAZO <=4 SENSITIVE Sensitive     * RARE CITROBACTER FREUNDII   Enterococcus faecalis - MIC*    AMPICILLIN <=2 SENSITIVE Sensitive     VANCOMYCIN 2 SENSITIVE Sensitive     GENTAMICIN SYNERGY SENSITIVE Sensitive     * ABUNDANT ENTEROCOCCUS FAECALIS   Morganella morganii - MIC*    AMPICILLIN >=32 RESISTANT Resistant     CEFTAZIDIME <=1 SENSITIVE Sensitive     CIPROFLOXACIN <=0.25 SENSITIVE Sensitive     GENTAMICIN <=1 SENSITIVE Sensitive     IMIPENEM 4 SENSITIVE Sensitive     TRIMETH/SULFA <=20 SENSITIVE Sensitive     AMPICILLIN/SULBACTAM >=32 RESISTANT Resistant     PIP/TAZO <=4 SENSITIVE Sensitive     * RARE MORGANELLA MORGANII  Culture, blood (Routine X 2) w Reflex to ID Panel     Status: None (Preliminary result)   Collection Time: 10/05/22  6:31 AM   Specimen: BLOOD LEFT HAND  Result Value Ref Range Status   Specimen Description BLOOD LEFT HAND  Final   Special Requests   Final    BOTTLES  DRAWN AEROBIC AND ANAEROBIC Blood Culture results may not be optimal due to an inadequate volume of blood received in culture bottles   Culture   Final    NO GROWTH  2 DAYS Performed at [facility], [address] [place], [place] [address]    Report Status PENDING  Incomplete  Culture, blood (Routine X 2) w Reflex to ID Panel     Status: None (Preliminary result)   Collection Time: 10/05/22  6:31 AM   Specimen: BLOOD RIGHT HAND  Result Value Ref Range Status   Specimen Description BLOOD RIGHT HAND  Final   Special Requests   Final    BOTTLES DRAWN AEROBIC AND ANAEROBIC Blood Culture adequate volume   Culture   Final    NO GROWTH 2 DAYS Performed at [facility], [address], [place], [place] [address]    Report Status PENDING  Incomplete         Radiology Studies: DG Basil Dess Tube Plc W/Fl W/Rad  Result Date: 10/07/2022 CLINICAL DATA:  Provided history: Dysphagia. Request received for NG tube placement under fluoroscopy. EXAM: NASO G TUBE PLACEMENT WITH FL AND WITH RAD CONTRAST:  None FLUOROSCOPY: Radiation Exposure Index (if provided by the fluoroscopic device): 6.30 mGy air kerma COMPARISON:  CT abdomen/pelvis 10/05/2022. FINDINGS: The tip of the enteric tube was lubricated and gently inserted into the right nostril. Under fluoroscopic guidance, the tube easily advanced into the esophagus and subsequently into the stomach. The tip of the nasogastric tube reach the gastric antrum. The procedure was performed by Alwyn Ren NP, supervised by Dr. Jackey Loge. IMPRESSION: Technically successful fluoroscopically-guided placement of a 10 French nasogastric feeding tube. The tip of the tube is located within the gastric antrum. Electronically Signed   By: Jackey Loge D.O.   On: 10/07/2022 16:05   CT ABDOMEN PELVIS WO CONTRAST  Result Date: 10/05/2022 CLINICAL DATA:  Ascites. Recent diagnosis of pancreatic cancer and biliary obstruction status post common bile duct stent placement. A facet [place]/structured acute cholecystitis. EXAM: CT ABDOMEN AND PELVIS WITHOUT CONTRAST TECHNIQUE: Multidetector CT imaging of the abdomen and  pelvis was performed following the standard protocol without IV contrast. RADIATION DOSE REDUCTION: This exam was performed according to the departmental dose-optimization program which includes automated exposure control, adjustment of the mA and/or kV according to patient size and/or use of iterative reconstruction technique. COMPARISON:  CT abdomen pelvis dated 09/29/2022. FINDINGS: Evaluation of this exam is limited in the absence of intravenous contrast. Lower chest: Partially visualized small bilateral pleural effusions and consolidative changes of the majority of the visualized lower lobes with air bronchograms which may represent atelectasis or pneumonia. A 5 mm nodule in the lingula as well as a 6 mm left lower lobe nodule. Small pockets of pneumoperitoneum in the upper abdomen likely introduced via drainage catheter placement. There is a small ascites. Hepatobiliary: Hypodense mass involving the caudate lobe of the liver measure up to 7.4 cm in keeping with metastatic disease. Additional ill-defined hypodense lesions within the liver also consistent with metastatic disease. There is mild dilatation and pneumobilia. The gallbladder is poorly visualized. There is air in the gallbladder in keeping with emphysematous cholecystitis. A percutaneous cholecystostomy with pigtail tip in the body of the gallbladder noted. There is debris within the gallbladder. Pancreas: Ill-defined mass at the head of the pancreas measuring 6.5 x 7.3 cm in keeping with known malignancy. This mass appears to encase the common bile duct. A common bowel duct stent is  noted. There is air in the proximal lumen of the stent. There is debris or infiltrative mass in the distal lumen of the stent. Spleen: Normal in size without focal abnormality. Adrenals/Urinary Tract: The adrenal glands are unremarkable. Mild bilateral hydronephrosis versus parapelvic cysts. No renal calculi noted. The visualized ureters appear unremarkable. The urinary  bladder is decompressed around a Foley catheter. Stomach/Bowel: There is sigmoid diverticulosis without active inflammatory changes. There is no bowel obstruction. The appendix is suboptimally visualized but grossly unremarkable. Vascular/Lymphatic: Mild aortoiliac atherosclerotic disease. The IVC is unremarkable. No portal venous gas. Retroperitoneal adenopathy including a 2 cm retrocaval lymph node. Reproductive: The prostate and seminal vesicles are grossly unremarkable. Other: Small fat containing bilateral inguinal hernia. There is diffuse subcutaneous edema. Musculoskeletal: No acute osseous pathology. IMPRESSION: 1. 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 2. Pancreatic head mass in keeping with known malignancy. 3. Hepatic metastatic disease. 4. Retroperitoneal adenopathy. 5. Small ascites. 6. Small bilateral pleural effusions and bibasilar atelectasis or infiltrate. 7. Aortic Atherosclerosis (ICD10-I70.0) and Emphysema (ICD10-J43.9). Electronically Signed   By: Elgie Collard M.D.   On: 10/05/2022 19:23        Scheduled Meds:  amiodarone  200 mg Oral BID   apixaban  5 mg Oral BID   atorvastatin  40 mg Oral q morning   Chlorhexidine Gluconate Cloth  6 each Topical QHS   docusate sodium  100 mg Oral BID   feeding supplement  237 mL Oral TID BM   insulin aspart  0-9 Units Subcutaneous Q4H   midodrine  10 mg Oral TID WC   mirtazapine  15 mg Oral QHS   multivitamin with minerals  1 tablet Oral Daily   mouth rinse  15 mL Mouth Rinse 4 times per day   sodium chloride flush  5 mL Intracatheter Q8H   Continuous Infusions:  sodium chloride Stopped (10/05/22 1622)   meropenem (MERREM) IV 1 g (10/07/22 1034)   promethazine (PHENERGAN) injection (IM or IVPB)       LOS: 10 days       Tresa Moore, MD [facility]   If 7PM-7AM, please contact night-coverage  10/07/2022, 5:04 PM

## 2022-10-07 NOTE — Plan of Care (Signed)
  Problem: Education: Goal: Ability to describe self-care measures that may prevent or decrease complications (Diabetes Survival Skills Education) will improve Outcome: Progressing   Problem: Coping: Goal: Ability to adjust to condition or change in health will improve Outcome: Progressing   Problem: Health Behavior/Discharge Planning: Goal: Ability to manage health-related needs will improve Outcome: Progressing   Problem: Metabolic: Goal: Ability to maintain appropriate glucose levels will improve Outcome: Progressing   Problem: Skin Integrity: Goal: Risk for impaired skin integrity will decrease Outcome: Progressing

## 2022-10-07 NOTE — Progress Notes (Signed)
Physical Therapy Treatment Patient Details Name: Cameron Gutierrez MRN: [id number] DOB: 1945/03/22 Today's Date: 10/07/2022   History of Present Illness 77 yo male who presented to [facility] ER on 07/13 with acute onset of severe abdominal/chest pain.  He was previously admitted at [facility] from 09/21/22-09/25/22 for abdominal pain along with unintentional weight loss.  During hospitalization at [facility] workup concerning for primary metastatic pancreatic adenocarcinoma.  He underwent ERCP/US with biopsy and stent placed on 09/23/22.  His pain was controlled post procedure, and pt discharged home on 09/25/22    PT Comments  Pt was long sitting in bed upon arrival. Supportive spouse present throughout. Pt is alert and cooperative. Oriented but endorses fatigue throughout. BP in long sitting prior to OOB activity: 96/64 (74) Sitting EOB 107/74 Standing 78/56 (65) lowest reading throughout session After getting to recliner 89/65(72) MAP stayed > 64 throughout session without pt c/o dizziness or orthostatic symptoms. Pt was severely limited by fatigue. HR < 100 bpm throughout. Pt on 2 L HFNC with sao2 > 90%. When removing O2 for less than 20sec Sao2 quickly desaturates. Pt did stand and take ~ 4 steps to recliner with RW but endorses fatigue requesting to sit. Acute PT will continue to follow and progress per current POC. He will greatly benefit from continued skilled PT to maximize activity tolerance and endurance while assisting pt to PLOF.     Assistance Recommended at Discharge Intermittent Supervision/Assistance  If plan is discharge home, recommend the following:  Can travel by private vehicle    A little help with walking and/or transfers;A little help with bathing/dressing/bathroom;Assistance with cooking/housework;Assist for transportation;Help with stairs or ramp for entrance      Equipment Recommendations  Other (comment) (TDB)       Precautions / Restrictions Precautions Precautions:  Fall Restrictions Weight Bearing Restrictions: No     Mobility  Bed Mobility Overal bed mobility: Needs Assistance Bed Mobility: Supine to Sit  Supine to sit: Min guard  General bed mobility comments: pt was able to exit R side of bed. Increased time but only TCs for sequencing. CGA for safety.    Transfers Overall transfer level: Needs assistance Equipment used: Rolling walker (2 wheels) Transfers: Sit to/from Stand Sit to Stand: Min assist  General transfer comment: Pt was able to stand from EOB to RW 2 x.    Ambulation/Gait Ambulation/Gait assistance: Min assist Gait Distance (Feet): 4 Feet Assistive device: Rolling walker (2 wheels) Gait Pattern/deviations: Step-through pattern Gait velocity: decreased  General Gait Details: pt was able to take ~ 4 steps from EOB to recliner with slow cautious gait. Distance limited by fatigue.    Balance Overall balance assessment: Needs assistance Sitting-balance support: Feet supported Sitting balance-Leahy Scale: Good     Standing balance support: Bilateral upper extremity supported, During functional activity, Reliant on assistive device for balance Standing balance-Leahy Scale: Fair       Cognition Arousal/Alertness: Awake/alert Behavior During Therapy: Flat affect, WFL for tasks assessed/performed Overall Cognitive Status: Within Functional Limits for tasks assessed    General Comments: pleasant throughout session but flat. Did not volunteer any information but answered all questions andresponds appropriately.               Pertinent Vitals/Pain Pain Assessment Pain Assessment: No/denies pain Pain Score: 0-No pain     PT Goals (current goals can now be found in the care plan section) Acute Rehab PT Goals Patient Stated Goal: get better Progress towards PT goals: Progressing toward goals  Frequency    Min 1X/week      PT Plan Current plan remains appropriate    Co-evaluation     PT goals  addressed during session: Mobility/safety with mobility;Balance;Proper use of DME;Strengthening/ROM        AM-PAC PT "6 Clicks" Mobility   Outcome Measure  Help needed turning from your back to your side while in a flat bed without using bedrails?: A Little Help needed moving from lying on your back to sitting on the side of a flat bed without using bedrails?: A Little Help needed moving to and from a bed to a chair (including a wheelchair)?: A Little Help needed standing up from a chair using your arms (e.g., wheelchair or bedside chair)?: A Little Help needed to walk in hospital room?: A Lot Help needed climbing 3-5 steps with a railing? : A Lot 6 Click Score: 16    End of Session Equipment Utilized During Treatment: Oxygen (2 L HFNC) Activity Tolerance: Patient limited by fatigue Patient left: in chair;with call bell/phone within reach;with family/visitor present Nurse Communication: Mobility status PT Visit Diagnosis: Muscle weakness (generalized) (M62.81);Difficulty in walking, not elsewhere classified (R26.2)     Time: 5956-3875 PT Time Calculation (min) (ACUTE ONLY): 28 min  Charges:    $Therapeutic Activity: 23-37 mins PT General Charges $$ ACUTE PT VISIT: 1 Visit                     Jetta Lout PTA 10/07/22, 10:07 AM

## 2022-10-07 NOTE — Progress Notes (Signed)
Nutrition Follow-up  DOCUMENTATION CODES:   Severe malnutrition in context of chronic illness  INTERVENTION:   Vital 1.5@60ml /hr- Initiate at 27ml/hr and increase by 69ml/hr q 8 hours until goal rate is reached.    ProSource TF 20- Give 60ml daily via tube, each supplement provides 80kcal and 20g of protein.    Free water flushes 30ml q4 hours to maintain tube patency    Regimen provides 2240kcal/day, 117g/day protein and 1250ml/day.    Pt at high refeed risk; recommend monitor potassium, magnesium and phosphorus labs daily until stable   Daily weights   Ensure Enlive po TID, each supplement provides 350 kcal and 20 grams of protein.  NUTRITION DIAGNOSIS:   Severe Malnutrition related to chronic illness (newly diagnosed pancreatic cancer) as evidenced by mild fat depletion, moderate fat depletion, moderate muscle depletion, severe muscle depletion, percent weight loss, energy intake < or equal to 75% for > or equal to 1 month. 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  -Pt s/p fluoroscopy guided NGT placement today with tip noted gastric.   Met with pt and family in room today. Pt reports continued poor appetite and oral intake in hospital; pt eating just bites of meals but reports that he is drinking 100% of his Ensure. NGT and nutrition support was discussed with pt and family. Pt decided to proceed with NGT placement and nutrition support as his goal is to recover  and get home. NGT unable to be placed at bedside and ended up being placed by fluoroscopy. Will initiate nutrition support today. Pt is at high refeed risk. Pt will be able to eat a mechanical soft diet for pleasure and will continue Ensure and Magic Cups.   Per chart, pt appears to be up ~20lbs since admission. Pt is noted to have edema and third spacing. Pt +5.5L on his I & Os.   Medications reviewed and include: colace, insulin, remeron, MVI, meropenem  Labs reviewed: Na 126(L), K 3.5 wnl, BUN 35(H), creat 1.47(H), P 2.7 wnl, Mg 2.2 wnl Wbc- 18.8(H), Hgb 10.1(L), Hct 28.0(L) Cbgs- 158, 181, 135, 149 x 24 hrs   Drains- 85ml   UOP-   Diet Order:   Diet Order             DIET DYS 2 Room service appropriate? Yes; Fluid consistency: Thin  Diet effective now                  EDUCATION NEEDS:   Education needs have been addressed  Skin:  Skin Assessment: Reviewed RN Assessment  Last BM:  7/23- type 6  Height:   Ht Readings from Last 1 Encounters:  10/03/22 6' (1.829 m)    Weight:   Wt Readings from Last 1 Encounters:  10/07/22 88.8 kg    Ideal Body Weight:  80.9 kg  BMI:  Body mass index is 26.55 kg/m.  Estimated Nutritional Needs:   Kcal:  2200-2500kcal/day  Protein:  110-125 grams  Fluid:  2.0-2.3L/day  Betsey Holiday MS, RD, LDN Please refer to [facility] for RD and/or RD on-call/weekend/after hours pager

## 2022-10-08 DIAGNOSIS — E1022 Type 1 diabetes mellitus with diabetic chronic kidney disease: Secondary | ICD-10-CM | POA: Diagnosis not present

## 2022-10-08 DIAGNOSIS — C259 Malignant neoplasm of pancreas, unspecified: Secondary | ICD-10-CM | POA: Diagnosis not present

## 2022-10-08 DIAGNOSIS — K651 Peritoneal abscess: Secondary | ICD-10-CM

## 2022-10-08 DIAGNOSIS — J90 Pleural effusion, not elsewhere classified: Secondary | ICD-10-CM | POA: Diagnosis not present

## 2022-10-08 DIAGNOSIS — I4891 Unspecified atrial fibrillation: Secondary | ICD-10-CM | POA: Diagnosis not present

## 2022-10-08 LAB — BASIC METABOLIC PANEL
BUN: 29 mg/dL — ABNORMAL HIGH (ref 8–23)
Calcium: 7.5 mg/dL — ABNORMAL LOW (ref 8.9–10.3)
Chloride: 97 mmol/L — ABNORMAL LOW (ref 98–111)
Creatinine, Ser: 1.28 mg/dL — ABNORMAL HIGH (ref 0.61–1.24)
GFR, Estimated: 58 mL/min — ABNORMAL LOW (ref >60)
Glucose, Bld: 180 mg/dL — ABNORMAL HIGH (ref 70–99)
Potassium: 4.1 mmol/L (ref 3.5–5.1)
Sodium: 129 mmol/L — ABNORMAL LOW (ref 135–145)

## 2022-10-08 LAB — CBC WITH DIFFERENTIAL/PLATELET
Abs Immature Granulocytes: 0.22 10*3/uL — ABNORMAL HIGH (ref 0.00–0.07)
Basophils Absolute: 0 10*3/uL (ref 0.0–0.1)
Basophils Relative: 0 %
Eosinophils Absolute: 0 10*3/uL (ref 0.0–0.5)
Eosinophils Relative: 0 %
HCT: 29.2 % — ABNORMAL LOW (ref 39.0–52.0)
Hemoglobin: 10 g/dL — ABNORMAL LOW (ref 13.0–17.0)
Immature Granulocytes: 1 %
Lymphocytes Relative: 5 %
Lymphs Abs: 0.9 10*3/uL (ref 0.7–4.0)
MCH: 27.9 pg (ref 26.0–34.0)
MCHC: 34.2 g/dL (ref 30.0–36.0)
MCV: 81.6 fL (ref 80.0–100.0)
Monocytes Absolute: 0.6 10*3/uL (ref 0.1–1.0)
Monocytes Relative: 3 %
Neutro Abs: 15.9 10*3/uL — ABNORMAL HIGH (ref 1.7–7.7)
Neutrophils Relative %: 91 %
Platelets: 257 10*3/uL (ref 150–400)
RBC: 3.58 MIL/uL — ABNORMAL LOW (ref 4.22–5.81)
RDW: 15 % (ref 11.5–15.5)
WBC: 17.6 10*3/uL — ABNORMAL HIGH (ref 4.0–10.5)
nRBC: 0 % (ref 0.0–0.2)

## 2022-10-08 LAB — GLUCOSE, CAPILLARY
Glucose-Capillary: 159 mg/dL — ABNORMAL HIGH (ref 70–99)
Glucose-Capillary: 142 mg/dL — ABNORMAL HIGH (ref 70–99)
Glucose-Capillary: 143 mg/dL — ABNORMAL HIGH (ref 70–99)
Glucose-Capillary: 154 mg/dL — ABNORMAL HIGH (ref 70–99)
Glucose-Capillary: 133 mg/dL — ABNORMAL HIGH (ref 70–99)
Glucose-Capillary: 162 mg/dL — ABNORMAL HIGH (ref 70–99)
Glucose-Capillary: 207 mg/dL — ABNORMAL HIGH (ref 70–99)
Glucose-Capillary: 190 mg/dL — ABNORMAL HIGH (ref 70–99)

## 2022-10-08 LAB — PHOSPHORUS: Phosphorus: 2 mg/dL — ABNORMAL LOW (ref 2.5–4.6)

## 2022-10-08 LAB — CULTURE, BLOOD (ROUTINE X 2): Culture: NO GROWTH

## 2022-10-08 LAB — MAGNESIUM: Magnesium: 2.3 mg/dL (ref 1.7–2.4)

## 2022-10-08 MED ORDER — SODIUM CHLORIDE 0.9 % IV SOLN
1.0000 g | Freq: Three times a day (TID) | INTRAVENOUS | Status: DC
  Administered 2022-10-08 – 2022-10-13 (×14): 1 g via INTRAVENOUS
  Filled 2022-10-08 (×15): qty 20

## 2022-10-08 MED ORDER — SODIUM PHOSPHATES 45 MMOLE/15ML IV SOLN
30.0000 mmol | Freq: Once | INTRAVENOUS | Status: AC
  Administered 2022-10-08: 30 mmol via INTRAVENOUS
  Filled 2022-10-08: qty 10

## 2022-10-08 NOTE — TOC Progression Note (Signed)
Transition of Care [facility]) - Progression Note    Patient Details  Name: Cameron Gutierrez MRN: [id number] Date of Birth: 1946-01-21  Transition of [facility] CM/SW Contact  Truddie Hidden, RN Phone Number: 10/08/2022, 2:30 PM  Clinical Narrative:    Spoke with patient regarding bed offers.  He is agreeable to bed offer at [facility]. RNCM advised patient insurance [employer] would still be required for admission.          Expected Discharge Plan and Services                                               Social Determinants of Health (SDOH) Interventions SDOH Screenings   Food Insecurity: No Food Insecurity (09/26/2022)  Housing: Low Risk  (09/26/2022)  Transportation Needs: No Transportation Needs (09/26/2022)  Utilities: Not At Risk (09/26/2022)  Financial Resource Strain: Low Risk  (09/21/2022)   Received from [facility]  Stress: No Stress Concern Present (09/21/2022)   Received from [facility]  Tobacco Use: Low Risk  (09/26/2022)  Health Literacy: Medium Risk (09/21/2022)   Received from [facility]    Readmission Risk Interventions     No data to display

## 2022-10-08 NOTE — Progress Notes (Signed)
Progress Note  Patient Name: Cameron Gutierrez Date of Encounter: 10/08/2022  Primary Cardiologist: New - consult by Kirke Corin   Subjective   Transferred out of the ICU yesterday to 2A. Remains in Afib with controlled ventricular rates. No chest pain or dyspnea.    Inpatient Medications    Scheduled Meds:  amiodarone  200 mg Oral BID   apixaban  5 mg Oral BID   atorvastatin  40 mg Oral q morning   Chlorhexidine Gluconate Cloth  6 each Topical QHS   docusate sodium  100 mg Oral BID   feeding supplement  237 mL Oral TID BM   feeding supplement (PROSource TF20)  60 mL Per Tube Daily   free water  30 mL Per Tube Q4H   insulin aspart  0-9 Units Subcutaneous Q4H   midodrine  10 mg Oral TID WC   mirtazapine  15 mg Oral QHS   multivitamin with minerals  1 tablet Oral Daily   mouth rinse  15 mL Mouth Rinse 4 times per day   sodium chloride flush  5 mL Intracatheter Q8H   Continuous Infusions:  sodium chloride 250 mL (10/07/22 2153)   feeding supplement (VITAL 1.5 CAL) 30 mL/hr at 10/08/22 0258   meropenem (MERREM) IV 1 g (10/07/22 2154)   promethazine (PHENERGAN) injection (IM or IVPB)     sodium phosphate 30 mmol in dextrose 5 % 250 mL infusion     PRN Meds: alum & mag hydroxide-simeth, fentaNYL (SUBLIMAZE) injection, levalbuterol, metoprolol tartrate, ondansetron **OR** ondansetron (ZOFRAN) IV, mouth rinse, oxyCODONE, polyethylene glycol, promethazine (PHENERGAN) injection (IM or IVPB), traMADol, traZODone   Vital Signs    Vitals:   10/07/22 1610 10/07/22 2007 10/07/22 2344 10/08/22 0443  BP: 108/60 99/62 101/71 105/65  Pulse: 92 84 90 85  Resp: 20 18 20 20   Temp: 98 F (36.7 C) 98 F (36.7 C) 97.9 F (36.6 C) 98.4 F (36.9 C)  TempSrc: Oral     SpO2: 96% 97% 98% 90%  Weight:      Height:        Intake/Output Summary (Last 24 hours) at 10/08/2022 0851 Last data filed at 10/08/2022 0500 Gross per 24 hour  Intake 673.03 ml  Output 1265 ml  Net -591.97 ml   Filed  Weights   10/04/22 0500 10/06/22 0500 10/07/22 0500  Weight: 85.8 kg 89.8 kg 88.8 kg    Telemetry    Afib, 70s to 80s bpm - Personally Reviewed  ECG    No new tracings - Personally Reviewed  Physical Exam   GEN: No acute distress. Chronically ill appearing.    Neck: No JVD. Cardiac: IRIR, no murmurs, rubs, or gallops.  Respiratory: Mildly diminished breath sounds bilaterally.  GI: Soft, nontender, non-distended.   MS: No edema; No deformity. Neuro:  Alert and oriented x 3; Nonfocal.  Psych: Normal affect.  Labs    Chemistry Recent Labs  Lab 10/04/22 0358 10/05/22 0631 10/05/22 8315 10/06/22 0619 10/07/22 0556 10/08/22 0437  NA 129* 130* 129* 127* 126* 129*  K 3.2* 3.4* 3.3* 3.8 3.5 4.1  CL 100 99 99 96* 97* 97*  CO2 20* 20* 19* 22 23 26   GLUCOSE 133* 120* 119* 142* 172* 180*  BUN 58* 43* 48* 44* 35* 29*  CREATININE 2.04* 1.71* 1.65* 1.60* 1.47* 1.28*  CALCIUM 7.1* 7.4* 7.3* 7.2* 7.4* 7.5*  PROT 4.8* 5.2*  --  4.9*  --   --   ALBUMIN 1.9*  1.8* 2.0* 1.9*  1.7*  --   --   AST 607* 284*  --  138*  --   --   ALT 288* 198*  --  139*  --   --   ALKPHOS 767* 631*  --  512*  --   --   BILITOT 1.6* 1.9*  --  1.7*  --   --   GFRNONAA 33* 41* 43* 45* 49* 58*  ANIONGAP 9 11 11 9 6 6      Hematology Recent Labs  Lab 10/05/22 0631 10/06/22 0619 10/07/22 0556  WBC 17.7* 19.4* 18.8*  RBC 4.01* 3.62* 3.55*  HGB 11.1* 10.2* 10.1*  HCT 31.5* 29.2* 28.0*  MCV 78.6* 80.7 78.9*  MCH 27.7 28.2 28.5  MCHC 35.2 34.9 36.1*  RDW 14.3 14.7 14.9  PLT 201 213 232    Cardiac EnzymesNo results for input(s): "TROPONINI" in the last 168 hours. No results for input(s): "TROPIPOC" in the last 168 hours.   BNPNo results for input(s): "BNP", "PROBNP" in the last 168 hours.   DDimer No results for input(s): "DDIMER" in the last 168 hours.   Radiology    CT ABDOMEN PELVIS WO CONTRAST  Result Date: 10/05/2022 IMPRESSION: 1. 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 2. Pancreatic head mass in keeping with known malignancy. 3. Hepatic metastatic disease. 4. Retroperitoneal adenopathy. 5. Small ascites. 6. Small bilateral pleural effusions and bibasilar atelectasis or infiltrate. 7. Aortic Atherosclerosis (ICD10-I70.0) and Emphysema (ICD10-J43.9). Electronically Signed   By: Elgie Collard M.D.   On: 10/05/2022 19:23    Cardiac Studies   2D echo 09/29/22: 1. Left ventricular ejection fraction, by estimation, is 60 to 65%. The  left ventricle has normal function. The left ventricle has no regional  wall motion abnormalities. There is mild asymmetric left ventricular  hypertrophy of the basal-septal segment.  Left ventricular diastolic function could not be evaluated.   2. Pulmonary artery pressure is mildly elevated (RVSP 30-35 mmHg plus  central venous/right atrial pressure). Right ventricular systolic function  is normal. The right ventricular size is normal.   3. Moderate pleural effusion in the right lateral region.   4. The mitral valve is normal in structure. Mild to moderate mitral valve  regurgitation.   5. Tricuspid valve regurgitation is mild to moderate.   6. The aortic valve is tricuspid. Aortic valve regurgitation is mild. No  aortic stenosis is present.   Patient Profile     77 y.o. male with history of adult onset type 1 diabetes, hypertension, CKD stage II, hyperlipidemia, atrial fibrillation of uncertain chronicity on chronic anticoagulation with apixaban, recently diagnosed metastatic pancreatic adenocarcinoma, who is being seen 09/29/2022 for the evaluation of atrial fibrillation.  Assessment & Plan    1. Afib: -Presented to the [facility] emergency department 7/13 with EKG revealing rate controlled atrial fibrillation -During procedure on 7/15, he went into A-fib RVR with rates of 116 bpm, procedure had to be canceled -Likely multifactorial from sepsis, cholecystitis, metastatic adenocarcinoma of the pancreas, hypotension, and  multiorgan failure  -Echo with preserved LV systolic function and normal wall motion  -Chronicity uncertain  -Ventricular rates improved in the 70s to 80s bpm currently -While not ideal, he has received several days of IV amiodarone for rate control (no other options) given hypotension requiring vasopressor support and AKI limiting beta blocker, calcium channel blocker, or digoxin -Continue oral amiodarone 200 mg bid with transition to 200 mg daily on 7/31  -If renal function or BP improve, would look to stop  oral amiodarone and start beta blocker or digoxin -Eliquis 5 mg bid -With noted comorbid conditions, no current plans to pursue rhythm control  at this time  2. Acute hypoxic respiratory failure: -Felt to be secondary to bilateral pleural effusions and atelectasis -Status post right-sided thoracentesis this admission with 900 cc removed -Stable on supplemental oxygen via nasal cannula at 3 L -Diurese as needed      For questions or updates, please contact [facility] Please consult [email or web address] for contact info under Cardiology/STEMI.    Signed, Eula Listen, PA-C [facility] Pager: [phone] 10/08/2022, 8:51 AM

## 2022-10-08 NOTE — Progress Notes (Signed)
Pharmacy Antibiotic Note  Cameron Gutierrez is a 77 y.o. male admitted on 09/26/2022 with  cholecystitis .  Pharmacy has been consulted for meropenem dosing.  Leukocytosis uptrending despite Zosyn.  ID consulted; aBax changed to meropenem.  Scr continued to improve 2.04>>1.28 today  Plan: Day#3 of meropenem therapy Change Meropenem 1 gram from 12 hours to q 8hrs  Height: 6' (182.9 cm) Weight: 88.8 kg (195 lb 12.3 oz) IBW/kg (Calculated) : 77.6  Temp (24hrs), Avg:98.1 F (36.7 C), Min:97.9 F (36.6 C), Max:98.4 F (36.9 C)  Recent Labs  Lab 10/04/22 0357 10/04/22 0358 10/05/22 0631 10/05/22 0633 10/06/22 0619 10/07/22 0556 10/08/22 0437  WBC 15.1*  --  17.7*  --  19.4* 18.8* 17.6*  CREATININE 1.99*   < > 1.71* 1.65* 1.60* 1.47* 1.28*   < > = values in this interval not displayed.    Estimated Creatinine Clearance: 54.7 mL/min (A) (by C-G formula based on SCr of 1.28 mg/dL (H)).    No Known Allergies  Antimicrobials this admission: Zosyn 7/17 >> 7/23 Meropenem 7/23 >>    Microbiology results: 7/22 BCx: NG x 1 day 7/17 UCx: gall bladder: C. Freunii, Morganella morganii, E. faucalis   Thank you for allowing pharmacy to be a part of this patient's care.  Davaris Youtsey Rodriguez-Guzman PharmD, BCPS 10/08/2022 12:54 PM

## 2022-10-08 NOTE — Progress Notes (Addendum)
[facility]  ROUNDING NOTE   Subjective:   77 yo male who presented to [facility] ER on 07/13 with acute onset of severe abdominal/chest pain.  He was previously admitted at [facility] from 09/21/22-09/25/22 for abdominal pain along with unintentional weight loss.  During hospitalization at [facility] workup concerning for primary metastatic pancreatic adenocarcinoma.  He underwent ERCP/US with biopsy and stent placed on 09/23/22.  Patient transferred from ICU on 10/07/2022 Patient seen sitting up in bed Wife assisting with breakfast Feeding tube placed  Denies shortness of breath Lower extremity edema remains  Creatinine 1.28 Urine output 1250 mL in preceding 24 hours  Objective:  Vital signs in last 24 hours:  Temp:  [97.9 F (36.6 C)-98.4 F (36.9 C)] 98.4 F (36.9 C) (07/25 0443) Pulse Rate:  [77-92] 85 (07/25 0443) Resp:  [17-26] 20 (07/25 0443) BP: (94-108)/(60-71) 105/65 (07/25 0443) SpO2:  [81 %-100 %] 90 % (07/25 0443)  Weight change:  Filed Weights   10/04/22 0500 10/06/22 0500 10/07/22 0500  Weight: 85.8 kg 89.8 kg 88.8 kg    Intake/Output: I/O last 3 completed shifts: In: 1105.1 [P.O.:240; I.V.:344; NG/GT:221.3; IV Piggyback:299.8] Out: 2413 [Urine:2350; Drains:63]   Intake/Output this shift:  Total I/O In: 720 [P.O.:720] Out: -   Physical Exam: General: NAD, cachectic, laying in bed  Head: Normocephalic, atraumatic. Moist oral mucosal membranes, feeding tube  Eyes: Anicteric,  Lungs:  Ada O2, normal breathing effort.  Heart: Irregular rhythm  Abdomen:  Soft, +epigastric tenderness, RUQ drain  Extremities:  Has some dependent peripheral edema.  Neurologic: Alert, able to follow simple commands.  Skin: No lesions  Access: none    Basic Metabolic Panel: Recent Labs  Lab 10/03/22 0406 10/03/22 0408 10/04/22 0358 10/05/22 0631 10/05/22 5643 10/06/22 3295 10/07/22 0556 10/08/22 0437  NA  --    < > 129* 130* 129* 127* 126* 129*  K  --    < >  3.2* 3.4* 3.3* 3.8 3.5 4.1  CL  --    < > 100 99 99 96* 97* 97*  CO2  --    < > 20* 20* 19* 22 23 26   GLUCOSE  --    < > 133* 120* 119* 142* 172* 180*  BUN  --    < > 58* 43* 48* 44* 35* 29*  CREATININE  --    < > 2.04* 1.71* 1.65* 1.60* 1.47* 1.28*  CALCIUM  --    < > 7.1* 7.4* 7.3* 7.2* 7.4* 7.5*  MG 2.5*  --   --  2.4  --  2.4 2.2 2.3  PHOS  --    < > 3.3  --  3.0 3.5 2.7 2.0*   < > = values in this interval not displayed.    Liver Function Tests: Recent Labs  Lab 10/02/22 1044 10/03/22 0408 10/04/22 0358 10/05/22 0631 10/05/22 0633 10/06/22 0619  AST 844*  --  607* 284*  --  138*  ALT 310*  --  288* 198*  --  139*  ALKPHOS 685*  --  767* 631*  --  512*  BILITOT 2.5*  --  1.6* 1.9*  --  1.7*  PROT 4.7*  --  4.8* 5.2*  --  4.9*  ALBUMIN 1.9* 1.9* 1.9*  1.8* 2.0* 1.9* 1.7*   Recent Labs  Lab 10/04/22 0358  LIPASE 29    No results for input(s): "AMMONIA" in the last 168 hours.  CBC: Recent Labs  Lab 10/03/22 0408 10/04/22 0357 10/05/22  1610 10/06/22 0619 10/07/22 0556  WBC 11.8* 15.1* 17.7* 19.4* 18.8*  HGB 11.4* 11.2* 11.1* 10.2* 10.1*  HCT 31.8* 31.1* 31.5* 29.2* 28.0*  MCV 75.7* 76.6* 78.6* 80.7 78.9*  PLT 206 196 201 213 232    Cardiac Enzymes: No results for input(s): "CKTOTAL", "CKMB", "CKMBINDEX", "TROPONINI" in the last 168 hours.  BNP: Invalid input(s): "POCBNP"  CBG: Recent Labs  Lab 10/07/22 1605 10/07/22 2008 10/07/22 2346 10/08/22 0444 10/08/22 0820  GLUCAP 158* 168* 157* 154* 133*    Microbiology: Results for orders placed or performed during the hospital encounter of 09/26/22  MRSA Next Gen by PCR, Nasal     Status: None   Collection Time: 09/28/22  4:12 PM   Specimen: Nasal Mucosa; Nasal Swab  Result Value Ref Range Status   MRSA by PCR Next Gen NOT DETECTED NOT DETECTED Final    Comment: (NOTE) The GeneXpert MRSA Assay (FDA approved for NASAL specimens only), is one component of a comprehensive MRSA colonization  surveillance program. It is not intended to diagnose MRSA infection nor to guide or monitor treatment for MRSA infections. Test performance is not FDA approved in patients less than 70 years old. Performed at [facility], [address] [place], [place] [address]   Aerobic/Anaerobic Culture w Gram Stain (surgical/deep wound)     Status: None   Collection Time: 09/30/22 12:51 PM   Specimen: Gallbladder; Abscess  Result Value Ref Range Status   Specimen Description   Final    GALL BLADDER Performed at [facility], [address], [place], [place] [address]    Special Requests   Final    NONE Performed at [facility], [address] [place], [place] [address]    Gram Stain   Final    NO WBC SEEN FEW GRAM VARIABLE ROD RARE GRAM POSITIVE COCCI IN PAIRS    Culture   Final    RARE CITROBACTER FREUNDII RARE MORGANELLA MORGANII ABUNDANT ENTEROCOCCUS FAECALIS NO ANAEROBES ISOLATED Performed at [facility], [address] [place], [place] [address]    Report Status 10/05/2022 FINAL  Final   Organism ID, Bacteria CITROBACTER FREUNDII  Final   Organism ID, Bacteria MORGANELLA MORGANII  Final   Organism ID, Bacteria ENTEROCOCCUS FAECALIS  Final      Susceptibility   Citrobacter freundii - MIC*    CEFEPIME <=0.12 SENSITIVE Sensitive     CEFTAZIDIME <=1 SENSITIVE Sensitive     CEFTRIAXONE <=0.25 SENSITIVE Sensitive     CIPROFLOXACIN <=0.25 SENSITIVE Sensitive     GENTAMICIN <=1 SENSITIVE Sensitive     IMIPENEM <=0.25 SENSITIVE Sensitive     TRIMETH/SULFA <=20 SENSITIVE Sensitive     PIP/TAZO <=4 SENSITIVE Sensitive     * RARE CITROBACTER FREUNDII   Enterococcus faecalis - MIC*    AMPICILLIN <=2 SENSITIVE Sensitive     VANCOMYCIN 2 SENSITIVE Sensitive     GENTAMICIN SYNERGY SENSITIVE Sensitive     * ABUNDANT ENTEROCOCCUS FAECALIS   Morganella morganii - MIC*    AMPICILLIN >=32 RESISTANT Resistant     CEFTAZIDIME <=1 SENSITIVE Sensitive      CIPROFLOXACIN <=0.25 SENSITIVE Sensitive     GENTAMICIN <=1 SENSITIVE Sensitive     IMIPENEM 4 SENSITIVE Sensitive     TRIMETH/SULFA <=20 SENSITIVE Sensitive     AMPICILLIN/SULBACTAM >=32 RESISTANT Resistant     PIP/TAZO <=4 SENSITIVE Sensitive     * RARE MORGANELLA MORGANII  Culture, blood (Routine X 2) w Reflex to ID  Panel     Status: None (Preliminary result)   Collection Time: 10/05/22  6:31 AM   Specimen: BLOOD LEFT HAND  Result Value Ref Range Status   Specimen Description BLOOD LEFT HAND  Final   Special Requests   Final    BOTTLES DRAWN AEROBIC AND ANAEROBIC Blood Culture results may not be optimal due to an inadequate volume of blood received in culture bottles   Culture   Final    NO GROWTH 3 DAYS Performed at [facility], [address], [place], [place] [address]    Report Status PENDING  Incomplete  Culture, blood (Routine X 2) w Reflex to ID Panel     Status: None (Preliminary result)   Collection Time: 10/05/22  6:31 AM   Specimen: BLOOD RIGHT HAND  Result Value Ref Range Status   Specimen Description BLOOD RIGHT HAND  Final   Special Requests   Final    BOTTLES DRAWN AEROBIC AND ANAEROBIC Blood Culture adequate volume   Culture   Final    NO GROWTH 3 DAYS Performed at [facility], [address], [place], [place] [address]    Report Status PENDING  Incomplete    Coagulation Studies: No results for input(s): "LABPROT", "INR" in the last 72 hours.   Urinalysis: No results for input(s): "COLORURINE", "LABSPEC", "PHURINE", "GLUCOSEU", "HGBUR", "BILIRUBINUR", "KETONESUR", "PROTEINUR", "UROBILINOGEN", "NITRITE", "LEUKOCYTESUR" in the last 72 hours.  Invalid input(s): "APPERANCEUR"     Imaging: DG Naso G Tube Plc W/Fl W/Rad  Result Date: 10/07/2022 CLINICAL DATA:  Provided history: Dysphagia. Request received for NG tube placement under fluoroscopy. EXAM: NASO G TUBE PLACEMENT WITH FL AND WITH RAD CONTRAST:  None FLUOROSCOPY: Radiation  Exposure Index (if provided by the fluoroscopic device): 6.30 mGy air kerma COMPARISON:  CT abdomen/pelvis 10/05/2022. FINDINGS: The tip of the enteric tube was lubricated and gently inserted into the right nostril. Under fluoroscopic guidance, the tube easily advanced into the esophagus and subsequently into the stomach. The tip of the nasogastric tube reach the gastric antrum. The procedure was performed by Alwyn Ren NP, supervised by Dr. Jackey Loge. IMPRESSION: Technically successful fluoroscopically-guided placement of a 10 French nasogastric feeding tube. The tip of the tube is located within the gastric antrum. Electronically Signed   By: Jackey Loge D.O.   On: 10/07/2022 16:05     Medications:    sodium chloride 250 mL (10/07/22 2153)   feeding supplement (VITAL 1.5 CAL) 30 mL/hr at 10/08/22 0258   meropenem (MERREM) IV 1 g (10/07/22 2154)   promethazine (PHENERGAN) injection (IM or IVPB)     sodium phosphate 30 mmol in dextrose 5 % 250 mL infusion      amiodarone  200 mg Oral BID   apixaban  5 mg Oral BID   atorvastatin  40 mg Oral q morning   Chlorhexidine Gluconate Cloth  6 each Topical QHS   docusate sodium  100 mg Oral BID   feeding supplement  237 mL Oral TID BM   feeding supplement (PROSource TF20)  60 mL Per Tube Daily   free water  30 mL Per Tube Q4H   insulin aspart  0-9 Units Subcutaneous Q4H   midodrine  10 mg Oral TID WC   mirtazapine  15 mg Oral QHS   multivitamin with minerals  1 tablet Oral Daily   mouth rinse  15 mL Mouth Rinse 4 times per day   sodium chloride flush  5 mL Intracatheter Q8H   alum &  mag hydroxide-simeth, fentaNYL (SUBLIMAZE) injection, levalbuterol, metoprolol tartrate, ondansetron **OR** ondansetron (ZOFRAN) IV, mouth rinse, oxyCODONE, polyethylene glycol, promethazine (PHENERGAN) injection (IM or IVPB), traMADol, traZODone  Assessment/ Plan:  Cameron Gutierrez is a 77 y.o.  male  with atrial fibrillation, pancreatic cancer, diabetes  mellitus type II, hyperlipidemia, hypertension, who was admitted to [facility] on 09/26/2022 for Generalized abdominal pain [R10.84] Adenocarcinoma of pancreas (HCC) [C25.9] Primary pancreatic cancer with metastasis to other site [facility]) [C25.9]  Nephrology consulted for acute kidney injury.    Acute Kidney Injury: baseline creatinine of 0.97 with GFR >60 on admission, 7/13. VI contrast exposure on 7/13. Suspect IV contrast induced nephropathy plus prerenal azotemia. -Renal function continues to improve.  Urine output 1.25 L recorded - holding home dose of lisinopril -Continue to avoid hypotension, currently prescribed midodrine   A-fib.  RVR causing hypotension and hemodynamic instability.  Cardiology team on board.  Transition to Eliquis and oral amiodarone.  Acalculous emphysematous cholecystitis-status post decompression.  JP drain remains in place.  Meropenem prescribed  Metastatic adenocarcinoma of the pancreas-metastatic to liver and lungs-poor prognosis-oncology team following.  Current plan is to optimize nutritional status then evaluate available options.  Diabetes type 2-hemoglobin A1c 7.0% from 09/26/2022. Glucose well managed  Hypokalemia-pharmacist assisting in replacement.  Corrected  Hyponatremia-related to poor oral intake and AKI.  Sodium 129  Due to renal recovery, we will sign off at this time. Feel free to reconsult if needed.    LOS: 11 Cameron Gutierrez 7/25/202410:42 AM

## 2022-10-08 NOTE — Progress Notes (Signed)
Date of Admission:  09/26/2022     ID: HOVANES HYMAS is a 77 y.o. 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 with a history of LADA, HTN, CKD, AFIB, HLD, RT TKA , metastatic pancreatic ca was  admitted with abdominal pain on 09/26/22 Pt was recently in [facility] between 7/8-7/11/24 with epigastric pain and weight loss and found to have pancreatic head mass and obstruction of the bile duct. 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 He was discharged home to follow up with oncology. He came to [facility] on 7/13 with worsening abdominal pain  Subjective: Pt had [place] yesterday Getting feeds Feeling okay Wife at bed side  Medications:   amiodarone  200 mg Oral BID   apixaban  5 mg Oral BID   atorvastatin  40 mg Oral q morning   Chlorhexidine Gluconate Cloth  6 each Topical QHS   docusate sodium  100 mg Oral BID   feeding supplement  237 mL Oral TID BM   feeding supplement (PROSource TF20)  60 mL Per Tube Daily   free water  30 mL Per Tube Q4H   insulin aspart  0-9 Units Subcutaneous Q4H   midodrine  10 mg Oral TID WC   mirtazapine  15 mg Oral QHS   multivitamin with minerals  1 tablet Oral Daily   mouth rinse  15 mL Mouth Rinse 4 times per day   sodium chloride flush  5 mL Intracatheter Q8H    Objective: Vital signs in last 24 hours: Patient Vitals for the past 24 hrs:  BP Temp Temp src Pulse Resp SpO2  10/08/22 1423 100/64 97.9 F (36.6 C) Oral 93 -- 93  %  10/08/22 1400 -- -- -- -- (!) 22 --  10/08/22 1300 -- -- -- -- 19 --  10/08/22 1200 -- -- -- -- 17 --  10/08/22 1100 -- -- -- -- (!) 23 --  10/08/22 1000 -- -- -- -- 17 --  10/08/22 0900 -- -- -- -- 17 --  10/08/22 0800 -- -- -- -- 17 --  10/08/22 0700 -- -- -- -- 19 --  10/08/22 0600 -- -- -- -- (!) 23 --  10/08/22 0443 105/65 98.4 F (36.9 C) -- 85 20 90 %  10/07/22 2344 101/71 97.9 F (36.6 C) -- 90 20 98 %  10/07/22 2007 99/62 98 F (36.7 C) -- 84 18 97 %  10/07/22 1610 108/60 98 F (36.7 C) Oral 92 20 96 %     PHYSICAL EXAM:  General: awake and alert NG tube.  Lungs: b/la ir entry. Heart:irregular. Abdomen: rt upper quadrant drains X 2 Extremities: atraumatic, no cyanosis. No edema. No clubbing Skin: No rashes or lesions. Or bruising Lymph: Cervical, supraclavicular normal. Neurologic: Grossly non-focal  Lab Results    Latest Ref Rng & Units 10/08/2022    4:37 AM 10/07/2022    5:56 AM 10/06/2022    6:19 AM  CBC  WBC 4.0 - 10.5 K/uL 17.6  18.8  19.4   Hemoglobin 13.0 - 17.0 g/dL 40.9  81.1  91.4   Hematocrit 39.0 - 52.0 % 29.2  28.0  29.2   Platelets 150 - 400 K/uL 257  232  213        Latest Ref Rng & Units 10/08/2022    4:37 AM 10/07/2022    5:56 AM 10/06/2022    6:19 AM  CMP  Glucose 70 - 99 mg/dL 782  956  213   BUN 8 - 23 mg/dL 29  35  44   Creatinine 0.61 - 1.24 mg/dL 0.86  5.78  4.69   Sodium 135 - 145 mmol/L 129  126  127   Potassium 3.5 - 5.1 mmol/L 4.1  3.5  3.8   Chloride 98 - 111 mmol/L 97  97  96   CO2 22 - 32 mmol/L 26  23  22    Calcium 8.9 - 10.3 mg/dL 7.5  7.4  7.2   Total Protein 6.5 - 8.1 g/dL   4.9   Total Bilirubin 0.3 - 1.2 mg/dL   1.7   Alkaline Phos 38 - 126 U/L   512   AST 15 - 41 U/L   138   ALT 0 - 44 U/L   139       Microbiology: Subcapsular abscess fluid culture- citrobacter, morganella and enterococcus Studies/Results: DG Naso G Tube Plc W/Fl W/Rad  Result Date: 10/07/2022 CLINICAL DATA:  Provided history: Dysphagia.  Request received for NG tube placement under fluoroscopy. EXAM: NASO G TUBE PLACEMENT WITH FL AND WITH RAD CONTRAST:  None FLUOROSCOPY: Radiation Exposure Index (if provided by the fluoroscopic device): 6.30 mGy air kerma COMPARISON:  CT abdomen/pelvis 10/05/2022. FINDINGS: The tip of the enteric tube was lubricated and gently inserted into the right nostril. Under fluoroscopic guidance, the tube easily advanced into the esophagus and subsequently into the stomach. The tip of the nasogastric tube reach the gastric antrum. The procedure was performed by Alwyn Ren NP, supervised by Dr. Jackey Loge. IMPRESSION: Technically successful fluoroscopically-guided placement of a 10 French nasogastric feeding tube. The tip of the tube is located within the gastric antrum. Electronically Signed   By: Jackey Loge D.O.   On: 10/07/2022 16:05     Assessment/Plan: Metastatic pancreatic cancer involving head of the pancrease wit bile duct stricture s/p ERCP and stent placement. Gall bladder distension secondary to obstruction of main bile duct leading to infection has perc cholecystostomy   Subcapsular abscess- s/p drain- multiple organisms in culture including citrobacter, morganella and enterococcus pt is currently on Meropenem ( day3) . Previously was on zosyn Day 9 of antibiotic- can DC on 8/31  Worsening leucocytosis could be from the malignancy itself especially with SMV. Portal vein compression   Afib- ? ? LADA- type I diabetes   AKI improving? ____________________________ Discussed with patient and wife at bed side

## 2022-10-08 NOTE — Progress Notes (Signed)
Mobility Specialist - Progress Note     10/08/22 1110  Mobility  Activity Ambulated with assistance in room;Stood at bedside;Transferred from bed to chair  Level of Assistance Minimal assist, patient does 75% or more  Assistive Device Front wheel walker  Distance Ambulated (ft) 4 ft  Range of Motion/Exercises Active  Activity Response Tolerated well  Mobility Referral Yes  $Mobility charge 1 Mobility  Mobility Specialist Start Time (ACUTE ONLY) 1040  Mobility Specialist Stop Time (ACUTE ONLY) 1110  Mobility Specialist Time Calculation (min) (ACUTE ONLY) 30 min   Pt resting in bed and agreeable to ambulation. Pt needs extra time to organize lines/leads before session can begin. Pt STS and ambulates to recliner MinA with RW. Pt left with needs in reach and chair alarm activated.   Johnathan Hausen Mobility Specialist 10/08/22, 11:29 AM

## 2022-10-08 NOTE — Consult Note (Signed)
PHARMACY CONSULT NOTE - FOLLOW UP  Pharmacy Consult for Electrolyte Monitoring and Replacement   Recent Labs: Potassium (mmol/L)  Date Value  10/08/2022 4.1  09/24/2013 3.8   Magnesium (mg/dL)  Date Value  40/98/1191 2.3   Calcium (mg/dL)  Date Value  47/82/9562 7.5 (L)   Calcium, Total (mg/dL)  Date Value  13/10/6576 8.8   Albumin (g/dL)  Date Value  46/96/2952 1.7 (L)   Phosphorus (mg/dL)  Date Value  84/13/2440 2.0 (L)   Sodium (mmol/L)  Date Value  10/08/2022 129 (L)  04/09/2021 141  09/24/2013 141    Assessment: 77 y.o. male with recently diagnosed advanced stage pancreatic cancer complicated by common bile duct stenosis status post ERCP with covered common bile duct stent placement on 09/23/22 at [facility].  He presented to [facility] ED 2 days later with abdominal pain and dyspnea.  Imaging has shown progressive cholecystitis, now with possible emphysematous changes and perforation with perihepatic fluid.  Pharmacy has been consulted to monitor and replace electrolytes.  Goal of Therapy:  Electrolytes WNL  Plan:  Phos 2.0 today (below goal), all other electrolytes are WNL Will replace with NaPhos 30 mmol x 1 dose IVPB Follow AM labs and replace electrolytes as needed  Nakhi Choi Rodriguez-Guzman PharmD, BCPS 10/08/2022 8:44 AM

## 2022-10-08 NOTE — Plan of Care (Signed)
  Problem: Education: Goal: Ability to describe self-care measures that may prevent or decrease complications (Diabetes Survival Skills Education) will improve Outcome: Progressing   Problem: Coping: Goal: Ability to adjust to condition or change in health will improve Outcome: Progressing   Problem: Fluid Volume: Goal: Ability to maintain a balanced intake and output will improve Outcome: Progressing   Problem: Health Behavior/Discharge Planning: Goal: Ability to manage health-related needs will improve Outcome: Progressing   Problem: Metabolic: Goal: Ability to maintain appropriate glucose levels will improve Outcome: Progressing   Problem: Nutritional: Goal: Maintenance of adequate nutrition will improve Outcome: Progressing Goal: Progress toward achieving an optimal weight will improve Outcome: Progressing   Problem: Skin Integrity: Goal: Risk for impaired skin integrity will decrease Outcome: Progressing   Problem: Tissue Perfusion: Goal: Adequacy of tissue perfusion will improve Outcome: Progressing   Problem: Education: Goal: Knowledge of General Education information will improve Description: Including pain rating scale, medication(s)/side effects and non-pharmacologic comfort measures Outcome: Progressing   Problem: Health Behavior/Discharge Planning: Goal: Ability to manage health-related needs will improve Outcome: Progressing   Problem: Clinical Measurements: Goal: Ability to maintain clinical measurements within normal limits will improve Outcome: Progressing Goal: Will remain free from infection Outcome: Progressing Goal: Diagnostic test results will improve Outcome: Progressing Goal: Respiratory complications will improve Outcome: Progressing Goal: Cardiovascular complication will be avoided Outcome: Progressing   Problem: Activity: Goal: Risk for activity intolerance will decrease Outcome: Progressing   Problem: Nutrition: Goal: Adequate  nutrition will be maintained Outcome: Progressing   Problem: Coping: Goal: Level of anxiety will decrease Outcome: Progressing   Problem: Elimination: Goal: Will not experience complications related to bowel motility Outcome: Progressing Goal: Will not experience complications related to urinary retention Outcome: Progressing   Problem: Pain Managment: Goal: General experience of comfort will improve Outcome: Progressing   Problem: Safety: Goal: Ability to remain free from injury will improve Outcome: Progressing   Problem: Skin Integrity: Goal: Risk for impaired skin integrity will decrease Outcome: Progressing

## 2022-10-08 NOTE — Care Management Important Message (Signed)
Important Message  Patient Details  Name: Cameron Gutierrez MRN: [id number] Date of Birth: 1945/12/01   Medicare Important Message Given:  N/A - LOS <3 / Initial given by admissions     Johnell Comings 10/08/2022, 12:09 PM

## 2022-10-08 NOTE — Progress Notes (Signed)
PROGRESS NOTE    Cameron Gutierrez  ZOX:[id number] DOB: 1945/08/16 DOA: 09/26/2022 PCP: Dondra Spry, MD    Brief Narrative:  77 yo male who presented to [facility] ER on 07/13 with acute onset of severe abdominal/chest pain.  He was previously admitted at [facility] from 09/21/22-09/25/22 for abdominal pain along with unintentional weight loss.  During hospitalization at [facility] workup concerning for primary metastatic pancreatic adenocarcinoma.  He underwent ERCP/US with biopsy and stent placed on 09/23/22.  His pain was controlled post procedure, and pt discharged home on 09/25/22 (prescribed oxycodone and eliquis resumed).  Upon arrival to [facility] ER significant lab results were: Na+ 133/K+ 3.4/glucose 148/calcium 7.4/albumin 2.2/wbc 15.7.  CT Abd Pelvis concerning for moderate distension of the gallbladder but no findings of acute cholecystitis, however pt continued to c/o persistent abdominal pain.  EDP consulted with [facility] Oncologist Dr. Everardo All, and following review of imaging did not feel pt required transfer to [facility].  Pt subsequently admitted to [facility] medsurg unit per hospitalist team for additional workup and treatment due to continued abdominal pain.    Significant Hospital events :    07/13: Pt admitted to the medsurg unit with diffuse abdominal pain following recent ERCP with stent placement and newly diagnosed primary pancreatic adenocarcinoma with metastasis  [place] Abd Limited RUQ revealed Gallbladder sludge with mild gallbladder wall thickening and small volume pericholecystic fluid. Positive sonographic Murphy sign. Findings are concerning for acute acalculous cholecystitis. Liver metastases seen on CT are not well identified by ultrasound.   07/14: General surgery consulted for acute acalculous cholecystitis and recommended decompression of the gallbladder via percutaneous cholecystostomy per IR and continue abx therapy.    07/15: Pt arrived to special recovery for cholecystostomy drain placement and  pt noted to be in atrial fibrillation with rvr heart rate 140 to 170's.  Cardiology notified and the pt received 5 mg of iv cardizem x1 dose/250 ml NS bolus/20 mg of iv lasix.  Procedure canceled and pt transferred to the stepdown unit for cardizem gtt.  PCCM team consulted to assist with management.     09/29/22 patient in no distress, met with family today.  Awaiting percutaneous cholecystostomy and thoracentesis.  Testing for TLS today.   09/30/22 patient with emphysematous cholecystitis and pneumoperitoneum.  S/p throacentesis with partial clinical improvement.  Plan for IR consultation and cholecystostomy tube, completed by IR.     10/01/22 patient remains weak and chronically ill appearing.  He is s/p GB drain placement by IR and plan today to remove NGT and start PO nourishment.  He is being seen by palliative cancer specialist.   10/02/22 patient had BM overnight no longer with bowel obstruction does have NG still. Remains in AF on heparin, but vitals improved with low dose neo now. Palliative oncology following.  Patient wishes to have cancer therapy if possible. WBC count is normalized today.   10/03/22 : As patient was off vasopressors care was transitioned to hospitalist service.  No overnight events as per nursing.  Blood pressure remained stable off pressors with few soft readings.  Improvement in renal functions.  Nephrology following.  Patient tolerating full liquid diet.  NG can come out tomorrow.   10/04/2022: The patient was seen and examined at his bedside.  His wife was present in the room.  Passed a swallow evaluation.  Okay to do soft diet.  Will repeat LFTs and lipase level.  Will closely monitor to ensure tolerance to soft diet.   10/05/22:  Tolerating a soft  diet well.  NG tube removed.  CT scan ab/p wo contrast ordered due to up-trending leukocytosis.  ID consulted to assist with the management.   10/06/22: The patient was seen and examined at his bedside.  His son was present in  the room.  His leukocytosis continues to uptrend.  Started Merrem and DC'd the Zosyn today.  Appreciate infectious disease assistance.  7/24: No acute events overnight.  Oral intake remains poor.  Discussed with SLP and RD.  Plan to place image guided Dobbhoff and initiate tube feeds due to malnutrition  7/25: Transferred out of ICU.  Dobbhoff in place.  Tube feeds initiated.   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  900 cc removed.  Oxygen status improved.  Now on 2 L. Plan: Continue oxygen via nasal cannula wean as tolerated Pulmonary hygiene Encourage incentive spirometry and flutter use Bronchodilators  Acute Kidney Injury -KDIGO 3 Multifactorial including ischemic/metabolic Off IV fluid. Holding home lisinopril Continue Foley Nephrology following   Acute urinary retention s/p foley cath placement on 09/28/22 Initially difficult to insert follow-up with urology Maintain Foley catheter for now   Permanent atrial fibrillation with rvr  Resolved hypotension secondary to antiarrhythmic medication  Patient transition to p.o. amiodarone and heparin gtt. on 7/24 Plan: Continue PCU status.  Continue p.o. amiodarone 200 mg twice daily and p.o. Eliquis 5 mg twice daily   #Acalculous emphysematous cholecystitis: Status post decompression now with JP drain in place. Elevated white cell count Currently on meropenem Plan: Continue Merrem ID follow-up Daily labs    #Primary metastatic adenocarcinoma of the pancreas  - Oncology consulted appreciate input  Poor prognosis Patient would like to attempt to optimize nutritional status to see if he would be a candidate for any ongoing therapies.  NG tube now in place.  Tube feeds   #Type I diabetes mellitus with hyperglycemia Hemoglobin A1c 7.0 on 09/26/2022. - CBG's ac/hs - SSI    Physical debility Continue PT OT with assistance and fall precautions. Fall precautions.   Moderate protein calorie malnutrition BMI 25 Moderate muscle mass loss Encourage oral intake and increasing protein calorie intake. Dobbhoff tube placed 7/24 RD consulted for tube feeds  DVT prophylaxis: Eliquis Code Status: DNR Family Communication: Spouse at bedside 7/24, 7/25 Disposition Plan: Status is: Inpatient Remains inpatient appropriate because: Multiple acute issues as above   Level of care: Progressive  Consultants:  ID Cardiology  Procedures:  Thoracentesis  Antimicrobials: Meropenem   Subjective: Seen and examined resting in bed.  Answers all questions appropriately.  Still appears fatigued.  Objective: Vitals:   10/07/22 1610 10/07/22 2007 10/07/22 2344 10/08/22 0443  BP: 108/60 99/62 101/71 105/65  Pulse: 92 84 90 85  Resp: 20 18 20 20   Temp: 98 F (36.7 C) 98 F (36.7 C) 97.9 F (36.6 C) 98.4 F (36.9 C)  TempSrc: Oral     SpO2: 96% 97% 98% 90%  Weight:      Height:        Intake/Output Summary (Last 24  hours) at 10/08/2022 1031 Last data filed at 10/08/2022 1029 Gross per 24 hour  Intake 1393.03 ml  Output 1265 ml  Net 128.03 ml   Filed Weights   10/04/22 0500 10/06/22 0500 10/07/22 0500  Weight: 85.8 kg 89.8 kg 88.8 kg    Examination:  General exam: Appears chronically ill and fatigued Respiratory system: Bibasilar crackle.  Normal work of breathing.  2 L Cardiovascular system: S1-S2, RRR, no murmur, no pedal edema Gastrointestinal system: Thin, soft, NT/ND, normal bowel  sounds Central nervous system: Alert and oriented. No focal neurological deficits. Extremities: Decreased power bilateral lower extremities.  Gait not assessed Skin: No rashes, lesions or ulcers Psychiatry: Judgement and insight appear normal. Mood & affect appropriate.     Data Reviewed: I have personally reviewed following labs and imaging studies  CBC: Recent Labs  Lab 10/03/22 0408 10/04/22 0357 10/05/22 0631 10/06/22 0619 10/07/22 0556  WBC 11.8* 15.1* 17.7* 19.4* 18.8*  HGB 11.4* 11.2* 11.1* 10.2* 10.1*  HCT 31.8* 31.1* 31.5* 29.2* 28.0*  MCV 75.7* 76.6* 78.6* 80.7 78.9*  PLT 206 196 201 213 232   Basic Metabolic Panel: Recent Labs  Lab 10/03/22 0406 10/03/22 0408 10/04/22 0358 10/05/22 0631 10/05/22 4132 10/06/22 0619 10/07/22 0556 10/08/22 0437  NA  --    < > 129* 130* 129* 127* 126* 129*  K  --    < > 3.2* 3.4* 3.3* 3.8 3.5 4.1  CL  --    < > 100 99 99 96* 97* 97*  CO2  --    < > 20* 20* 19* 22 23 26   GLUCOSE  --    < > 133* 120* 119* 142* 172* 180*  BUN  --    < > 58* 43* 48* 44* 35* 29*  CREATININE  --    < > 2.04* 1.71* 1.65* 1.60* 1.47* 1.28*  CALCIUM  --    < > 7.1* 7.4* 7.3* 7.2* 7.4* 7.5*  MG 2.5*  --   --  2.4  --  2.4 2.2 2.3  PHOS  --    < > 3.3  --  3.0 3.5 2.7 2.0*   < > = values in this interval not displayed.   GFR: Estimated Creatinine Clearance: 54.7 mL/min (A) (by C-G formula based on SCr of 1.28 mg/dL (H)). Liver Function Tests: Recent Labs  Lab 10/02/22 1044 10/03/22 0408 10/04/22 0358 10/05/22 0631 10/05/22 0633 10/06/22 0619  AST 844*  --  607* 284*  --  138*  ALT 310*  --  288* 198*  --  139*  ALKPHOS 685*  --  767* 631*  --  512*  BILITOT 2.5*  --  1.6* 1.9*  --  1.7*  PROT 4.7*  --  4.8* 5.2*  --  4.9*  ALBUMIN 1.9* 1.9* 1.9*  1.8* 2.0* 1.9* 1.7*   Recent Labs  Lab 10/04/22 0358  LIPASE 29   No results for input(s): "AMMONIA" in the last 168 hours. Coagulation Profile: No results for input(s): "INR", "PROTIME"  in the last 168 hours. Cardiac Enzymes: No results for input(s): "CKTOTAL", "CKMB", "CKMBINDEX", "TROPONINI" in the last 168 hours. BNP (last 3 results) No results for input(s): "PROBNP" in the last 8760 hours. HbA1C: No results for input(s): "HGBA1C" in the last 72 hours. CBG: Recent Labs  Lab 10/07/22 1605 10/07/22 2008 10/07/22 2346 10/08/22 0444 10/08/22 0820  GLUCAP 158* 168* 157* 154* 133*   Lipid Profile: No results for input(s): "CHOL", "HDL", "LDLCALC", "TRIG", "CHOLHDL", "LDLDIRECT"  in the last 72 hours. Thyroid Function Tests: No results for input(s): "TSH", "T4TOTAL", "FREET4", "T3FREE", "THYROIDAB" in the last 72 hours. Anemia Panel: No results for input(s): "VITAMINB12", "FOLATE", "FERRITIN", "TIBC", "IRON", "RETICCTPCT" in the last 72 hours. Sepsis Labs: Recent Labs  Lab 10/02/22 0423  PROCALCITON 5.95    Recent Results (from the past 240 hour(s))  MRSA Next Gen by PCR, Nasal     Status: None   Collection Time: 09/28/22  4:12 PM   Specimen: Nasal Mucosa; Nasal Swab  Result Value Ref Range Status   MRSA by PCR Next Gen NOT DETECTED NOT DETECTED Final    Comment: (NOTE) The GeneXpert MRSA Assay (FDA approved for NASAL specimens only), is one component of a comprehensive MRSA colonization surveillance program. It is not intended to diagnose MRSA infection nor to guide or monitor treatment for MRSA infections. Test performance is not FDA approved in patients less than 47 years old. Performed at [facility], [address] [place], [place] [address]   Aerobic/Anaerobic Culture w Gram Stain (surgical/deep wound)     Status: None   Collection Time: 09/30/22 12:51 PM   Specimen: Gallbladder; Abscess  Result Value Ref Range Status   Specimen Description   Final    GALL BLADDER Performed at [facility], [address] [place], [place] [address]    Special Requests   Final    NONE Performed at [facility], [address] [place], [place] [address]    Gram Stain   Final    NO WBC SEEN FEW GRAM VARIABLE ROD RARE GRAM POSITIVE COCCI IN PAIRS    Culture   Final    RARE CITROBACTER FREUNDII RARE MORGANELLA MORGANII ABUNDANT ENTEROCOCCUS FAECALIS NO ANAEROBES ISOLATED Performed at [facility], [address] [place], [place] [address]    Report Status 10/05/2022 FINAL  Final   Organism ID, Bacteria CITROBACTER FREUNDII  Final   Organism ID, Bacteria MORGANELLA MORGANII  Final   Organism ID, Bacteria ENTEROCOCCUS FAECALIS  Final      Susceptibility   Citrobacter freundii - MIC*    CEFEPIME <=0.12 SENSITIVE Sensitive     CEFTAZIDIME <=1 SENSITIVE Sensitive     CEFTRIAXONE <=0.25 SENSITIVE Sensitive     CIPROFLOXACIN <=0.25 SENSITIVE Sensitive     GENTAMICIN <=1 SENSITIVE Sensitive     IMIPENEM <=0.25 SENSITIVE Sensitive     TRIMETH/SULFA <=20 SENSITIVE Sensitive     PIP/TAZO <=4 SENSITIVE Sensitive     * RARE CITROBACTER FREUNDII   Enterococcus faecalis - MIC*    AMPICILLIN <=2 SENSITIVE Sensitive     VANCOMYCIN 2 SENSITIVE Sensitive     GENTAMICIN SYNERGY SENSITIVE Sensitive     * ABUNDANT ENTEROCOCCUS FAECALIS   Morganella morganii - MIC*    AMPICILLIN >=32 RESISTANT Resistant     CEFTAZIDIME <=1 SENSITIVE Sensitive     CIPROFLOXACIN <=0.25 SENSITIVE Sensitive     GENTAMICIN <=1 SENSITIVE Sensitive     IMIPENEM 4 SENSITIVE Sensitive     TRIMETH/SULFA <=20 SENSITIVE Sensitive     AMPICILLIN/SULBACTAM >=32 RESISTANT Resistant     PIP/TAZO <=4 SENSITIVE Sensitive     * RARE MORGANELLA MORGANII  Culture, blood (Routine X 2) w Reflex to ID Panel     Status: None (Preliminary result)   Collection Time: 10/05/22  6:31 AM   Specimen: BLOOD LEFT HAND  Result Value Ref Range Status   Specimen Description BLOOD LEFT HAND  Final   Special Requests  Final    BOTTLES DRAWN AEROBIC AND ANAEROBIC Blood Culture results may not be optimal due to an inadequate volume of blood received in culture  bottles   Culture   Final    NO GROWTH 3 DAYS Performed at [facility], [address] [place], [place] [address]    Report Status PENDING  Incomplete  Culture, blood (Routine X 2) w Reflex to ID Panel     Status: None (Preliminary result)   Collection Time: 10/05/22  6:31 AM   Specimen: BLOOD RIGHT HAND  Result Value Ref Range Status   Specimen Description BLOOD RIGHT HAND  Final   Special Requests   Final    BOTTLES DRAWN AEROBIC AND ANAEROBIC Blood Culture adequate volume   Culture   Final    NO GROWTH 3 DAYS Performed at [facility], [address], [place], [place] [address]    Report Status PENDING  Incomplete         Radiology Studies: DG Basil Dess Tube Plc W/Fl W/Rad  Result Date: 10/07/2022 CLINICAL DATA:  Provided history: Dysphagia. Request received for NG tube placement under fluoroscopy. EXAM: NASO G TUBE PLACEMENT WITH FL AND WITH RAD CONTRAST:  None FLUOROSCOPY: Radiation Exposure Index (if provided by the fluoroscopic device): 6.30 mGy air kerma COMPARISON:  CT abdomen/pelvis 10/05/2022. FINDINGS: The tip of the enteric tube was lubricated and gently inserted into the right nostril. Under fluoroscopic guidance, the tube easily advanced into the esophagus and subsequently into the stomach. The tip of the nasogastric tube reach the gastric antrum. The procedure was performed by Alwyn Ren NP, supervised by Dr. Jackey Loge. IMPRESSION: Technically successful fluoroscopically-guided placement of a 10 French nasogastric feeding tube. The tip of the tube is located within the gastric antrum. Electronically Signed   By: Jackey Loge D.O.   On: 10/07/2022 16:05        Scheduled Meds:  amiodarone  200 mg Oral BID   apixaban  5 mg Oral BID   atorvastatin  40 mg Oral q morning   Chlorhexidine Gluconate Cloth  6 each Topical QHS   docusate sodium  100 mg Oral BID   feeding supplement  237 mL Oral TID BM   feeding supplement (PROSource TF20)  60 mL  Per Tube Daily   free water  30 mL Per Tube Q4H   insulin aspart  0-9 Units Subcutaneous Q4H   midodrine  10 mg Oral TID WC   mirtazapine  15 mg Oral QHS   multivitamin with minerals  1 tablet Oral Daily   mouth rinse  15 mL Mouth Rinse 4 times per day   sodium chloride flush  5 mL Intracatheter Q8H   Continuous Infusions:  sodium chloride 250 mL (10/07/22 2153)   feeding supplement (VITAL 1.5 CAL) 30 mL/hr at 10/08/22 0258   meropenem (MERREM) IV 1 g (10/07/22 2154)   promethazine (PHENERGAN) injection (IM or IVPB)     sodium phosphate 30 mmol in dextrose 5 % 250 mL infusion       LOS: 11 days       Tresa Moore, MD [facility]   If 7PM-7AM, please contact night-coverage  10/08/2022, 10:31 AM

## 2022-10-09 ENCOUNTER — Inpatient Hospital Stay: Payer: Medicare Other

## 2022-10-09 DIAGNOSIS — C259 Malignant neoplasm of pancreas, unspecified: Secondary | ICD-10-CM | POA: Diagnosis not present

## 2022-10-09 DIAGNOSIS — L0291 Cutaneous abscess, unspecified: Secondary | ICD-10-CM | POA: Diagnosis not present

## 2022-10-09 DIAGNOSIS — K810 Acute cholecystitis: Secondary | ICD-10-CM | POA: Diagnosis not present

## 2022-10-09 LAB — GLUCOSE, CAPILLARY
Glucose-Capillary: 145 mg/dL — ABNORMAL HIGH (ref 70–99)
Glucose-Capillary: 154 mg/dL — ABNORMAL HIGH (ref 70–99)
Glucose-Capillary: 168 mg/dL — ABNORMAL HIGH (ref 70–99)
Glucose-Capillary: 172 mg/dL — ABNORMAL HIGH (ref 70–99)
Glucose-Capillary: 173 mg/dL — ABNORMAL HIGH (ref 70–99)
Glucose-Capillary: 174 mg/dL — ABNORMAL HIGH (ref 70–99)

## 2022-10-09 LAB — BASIC METABOLIC PANEL: Sodium: 128 mmol/L — ABNORMAL LOW (ref 135–145)

## 2022-10-09 MED ORDER — INSULIN GLARGINE-YFGN 100 UNIT/ML ~~LOC~~ SOLN
8.0000 [IU] | Freq: Every day | SUBCUTANEOUS | Status: DC
  Administered 2022-10-09 – 2022-10-12 (×4): 8 [IU] via SUBCUTANEOUS
  Filled 2022-10-09 (×5): qty 0.08

## 2022-10-09 MED ORDER — BISACODYL 10 MG RE SUPP
10.0000 mg | Freq: Every day | RECTAL | Status: DC | PRN
  Administered 2022-10-09: 10 mg via RECTAL
  Filled 2022-10-09: qty 1

## 2022-10-09 MED ORDER — SENNOSIDES-DOCUSATE SODIUM 8.6-50 MG PO TABS
1.0000 | ORAL_TABLET | Freq: Two times a day (BID) | ORAL | Status: DC
  Administered 2022-10-09 – 2022-10-12 (×5): 1 via ORAL
  Filled 2022-10-09 (×6): qty 1

## 2022-10-09 MED ORDER — POLYETHYLENE GLYCOL 3350 17 G PO PACK
17.0000 g | PACK | Freq: Every day | ORAL | Status: DC
  Administered 2022-10-09: 17 g via ORAL
  Filled 2022-10-09: qty 1

## 2022-10-09 MED ORDER — MELATONIN 5 MG PO TABS
5.0000 mg | ORAL_TABLET | Freq: Every evening | ORAL | Status: DC | PRN
  Administered 2022-10-09: 5 mg via ORAL
  Filled 2022-10-09: qty 1

## 2022-10-09 MED ORDER — SIMETHICONE 80 MG PO CHEW
80.0000 mg | CHEWABLE_TABLET | Freq: Four times a day (QID) | ORAL | Status: DC
  Administered 2022-10-09 – 2022-10-12 (×10): 80 mg via ORAL
  Filled 2022-10-09 (×12): qty 1

## 2022-10-09 NOTE — TOC Progression Note (Signed)
Transition of Care [facility]) - Progression Note    Patient Details  Name: COLEN RENEE MRN: [id number] Date of Birth: Jul 31, 1945  [facility] CM/SW Contact  Truddie Hidden, RN Phone Number: 10/09/2022, 11:43 AM  Clinical Narrative:    [facility] started.         Expected Discharge Plan and Services                                               Social Determinants of Health (SDOH) Interventions SDOH Screenings   Food Insecurity: No Food Insecurity (09/26/2022)  Housing: Low Risk  (09/26/2022)  Transportation Needs: No Transportation Needs (09/26/2022)  Utilities: Not At Risk (09/26/2022)  Financial Resource Strain: Low Risk  (09/21/2022)   Received from [facility]  Stress: No Stress Concern Present (09/21/2022)   Received from [facility]  Tobacco Use: Low Risk  (09/26/2022)  Health Literacy: Medium Risk (09/21/2022)   Received from [facility]    Readmission Risk Interventions     No data to display

## 2022-10-09 NOTE — Progress Notes (Signed)
Referring Physician(s): Dr. 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  He does arouse with addressed but quickly falls back to sleep.  RUQ drains in place x2.  Allergies: Patient has no known allergies.  Medications: Prior to Admission medications   Medication Sig Start Date End Date Taking? Authorizing Provider  amLODipine (NORVASC) 10 MG tablet Take 10 mg by mouth daily. 03/20/21  Yes [provider]  atorvastatin (LIPITOR) 40 MG tablet Take 40 mg by mouth every morning. 03/20/21  Yes [provider]  ELIQUIS 5 MG TABS tablet Take 5 mg by mouth 2 (two) times daily. 03/20/21  Yes [provider]  insulin glargine (LANTUS) 100 UNIT/ML injection Inject 18 Units into the skin daily.   Yes [provider]  lisinopril (ZESTRIL) 5 MG tablet Take 5 mg by mouth every morning. 03/02/21  Yes [provider]  oxyCODONE (OXY IR/ROXICODONE) 5 MG immediate release tablet Take 5 mg by mouth every 6 (six) hours as needed for severe pain.   Yes [provider]  blood glucose meter kit and supplies KIT Dispense based on patient and insurance preference. Use up to four times daily as directed. 03/27/22   Sherryll Burger, Pratik D, DO  Insulin Pen Needle (PEN NEEDLES 3/16") 31G X 5 MM MISC Take as directed with insulin pen. 03/27/22   Sherryll Burger, Pratik D, DO     Vital Signs: BP 109/66 (BP Location: Right Arm)   Pulse 85   Temp 97.7 F (36.5 C)   Resp 18   Ht 6' (1.829 m)   Wt 200 lb 6.4 oz (90.9 kg)   SpO2 92%   BMI 27.18 kg/m   Physical Exam NAD, sleeping soundly Abdomen: RUQ drain #1 intact.  Insertion site clean, dry, intact.   Output thin, cloudy yellow. Fibrinous material cleaned from tubing. Flushes and aspirates easily. RUQ drain #2/perc chole in place.  Insertion site intact. Light bilious fluid in bulb.  Drain flushes but aspirates slowly.   Imaging: DG Abd 1 View  Result Date: 10/09/2022 CLINICAL DATA:  Abdominal pain.  History of pancreatic cancer. EXAM: ABDOMEN - 1 VIEW COMPARISON:  CT 10/05/2022 FINDINGS: Tip of the weighted enteric tube in the right upper quadrant in the region of the distal stomach. There is gaseous gastric distension. Tube biliary drains and a biliary stent are in place. Small to moderate volume of stool in the colon. Increased air throughout mildly dilated small bowel in the central abdomen. Pelvic calcifications consistent with phleboliths. Vascular calcifications are seen. IMPRESSION: 1. Tip of the weighted enteric tube in the right upper quadrant in the region of the distal stomach. Gaseous gastric distension. 2. Increased air throughout mildly dilated small bowel in the central abdomen, may be ileus or enteritis. Electronically Signed   By: Narda Rutherford M.D.   On: 10/09/2022 13:09   DG Basil Dess Tube Plc W/Fl W/Rad  Result Date: 10/07/2022 CLINICAL DATA:  Provided history: Dysphagia. Request received for NG tube placement under fluoroscopy. EXAM: NASO G TUBE PLACEMENT WITH FL AND WITH RAD CONTRAST:  None FLUOROSCOPY: Radiation Exposure Index (if provided by the fluoroscopic device): 6.30 mGy air kerma COMPARISON:  CT abdomen/pelvis 10/05/2022. FINDINGS: The tip of the enteric tube was lubricated and  gently inserted into the right nostril. Under fluoroscopic guidance, the tube easily advanced into the esophagus and subsequently into the stomach. The tip of the nasogastric tube reach the gastric antrum. The procedure was performed by Alwyn Ren NP, supervised by Dr. Jackey Loge. IMPRESSION: Technically successful fluoroscopically-guided placement of a 10 French nasogastric feeding tube. The  tip of the tube is located within the gastric antrum. Electronically Signed   By: Jackey Loge D.O.   On: 10/07/2022 16:05   CT ABDOMEN PELVIS WO CONTRAST  Result Date: 10/05/2022 CLINICAL DATA:  Ascites. Recent diagnosis of pancreatic cancer and biliary obstruction status post common bile duct stent placement. A facet [place]/structured acute cholecystitis. EXAM: CT ABDOMEN AND PELVIS WITHOUT CONTRAST TECHNIQUE: Multidetector CT imaging of the abdomen and pelvis was performed following the standard protocol without IV contrast. RADIATION DOSE REDUCTION: This exam was performed according to the departmental dose-optimization program which includes automated exposure control, adjustment of the mA and/or kV according to patient size and/or use of iterative reconstruction technique. COMPARISON:  CT abdomen pelvis dated 09/29/2022. FINDINGS: Evaluation of this exam is limited in the absence of intravenous contrast. Lower chest: Partially visualized small bilateral pleural effusions and consolidative changes of the majority of the visualized lower lobes with air bronchograms which may represent atelectasis or pneumonia. A 5 mm nodule in the lingula as well as a 6 mm left lower lobe nodule. Small pockets of pneumoperitoneum in the upper abdomen likely introduced via drainage catheter placement. There is a small ascites. Hepatobiliary: Hypodense mass involving the caudate lobe of the liver measure up to 7.4 cm in keeping with metastatic disease. Additional ill-defined hypodense lesions within the liver also consistent with metastatic disease. There is mild dilatation and pneumobilia. The gallbladder is poorly visualized. There is air in the gallbladder in keeping with emphysematous cholecystitis. A percutaneous cholecystostomy with pigtail tip in the body of the gallbladder noted. There is debris within the gallbladder. Pancreas: Ill-defined mass at the head of the pancreas measuring 6.5 x 7.3 cm in keeping with known  malignancy. This mass appears to encase the common bile duct. A common bowel duct stent is noted. There is air in the proximal lumen of the stent. There is debris or infiltrative mass in the distal lumen of the stent. Spleen: Normal in size without focal abnormality. Adrenals/Urinary Tract: The adrenal glands are unremarkable. Mild bilateral hydronephrosis versus parapelvic cysts. No renal calculi noted. The visualized ureters appear unremarkable. The urinary bladder is decompressed around a Foley catheter. Stomach/Bowel: There is sigmoid diverticulosis without active inflammatory changes. There is no bowel obstruction. The appendix is suboptimally visualized but grossly unremarkable. Vascular/Lymphatic: Mild aortoiliac atherosclerotic disease. The IVC is unremarkable. No portal venous gas. Retroperitoneal adenopathy including a 2 cm retrocaval lymph node. Reproductive: The prostate and seminal vesicles are grossly unremarkable. Other: Small fat containing bilateral inguinal hernia. There is diffuse subcutaneous edema. Musculoskeletal: No acute osseous pathology. IMPRESSION: 1. 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 2. Pancreatic head mass in keeping with known malignancy. 3. Hepatic metastatic disease. 4. Retroperitoneal adenopathy. 5. Small ascites. 6. Small bilateral pleural effusions and bibasilar atelectasis or infiltrate. 7. Aortic Atherosclerosis (ICD10-I70.0) and Emphysema (ICD10-J43.9). Electronically Signed   By: Elgie Collard M.D.   On: 10/05/2022 19:23    Labs:  CBC: Recent Labs    10/06/22 0619 10/07/22 0556 10/08/22 0437 10/09/22 0437  WBC 19.4* 18.8* 17.6* 19.6*  HGB 10.2* 10.1* 10.0* 9.3*  HCT 29.2* 28.0* 29.2* 27.6*  PLT 213 232  257 263    COAGS: Recent Labs    09/27/22 1421 09/28/22 0916 09/28/22 1607 09/29/22 0949 09/29/22 2246 09/30/22 0334 09/30/22 0851 10/01/22 0013  INR 1.7* 1.7*  --  2.0*  --  2.0*  --   --   APTT  --   --    < > 100*  128*  --  114* 128*   < > = values in this interval not displayed.    BMP: Recent Labs    10/06/22 0619 10/07/22 0556 10/08/22 0437 10/09/22 0437  NA 127* 126* 129* 128*  K 3.8 3.5 4.1 4.2  CL 96* 97* 97* 98  CO2 22 23 26 24   GLUCOSE 142* 172* 180* 166*  BUN 44* 35* 29* 30*  CALCIUM 7.2* 7.4* 7.5* 7.6*  CREATININE 1.60* 1.47* 1.28* 1.10  GFRNONAA 45* 49* 58* >60    LIVER FUNCTION TESTS: Recent Labs    10/02/22 1044 10/03/22 0408 10/04/22 0358 10/05/22 0631 10/05/22 0633 10/06/22 0619  BILITOT 2.5*  --  1.6* 1.9*  --  1.7*  AST 844*  --  607* 284*  --  138*  ALT 310*  --  288* 198*  --  139*  ALKPHOS 685*  --  767* 631*  --  512*  PROT 4.7*  --  4.8* 5.2*  --  4.9*  ALBUMIN 1.9*   < > 1.9*  1.8* 2.0* 1.9* 1.7*   < > = values in this interval not displayed.    Assessment and Plan: Perihepatic fluid collection s/p aspiration/drainage.  Cholecystitis s/p percutaneous cholecystostomy tube placement  Both drains remain in place.  Insertion sites are intact.   There is ongoing cloudy yellow fluid from the perihepatic drain- 20-40 mL/day.  There is ongoing drainage from the percutaneous cholecystostomy.  Per chole flushes, however aspirates slowly.  Discussed with El-Abd.  May have sufficient decompression of the gall bladder, however given the slight elevation in his WBC from baseline today will monitor for the next few days and consider cholangiogram early next week.    Otherwise continue current drain management.   Electronically Signed: Hoyt Koch, PA 10/09/2022, 4:30 PM   I spent a total of 15 Minutes at the the patient's bedside AND on the patient's hospital floor or unit, greater than 50% of which was counseling/coordinating care for perihepatic fluid collection, cholecystitis.

## 2022-10-09 NOTE — Progress Notes (Signed)
Date of Admission:  09/26/2022     ID: Cameron Gutierrez is a 77 y.o. 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 with a history of LADA, HTN, CKD, AFIB, HLD, RT TKA , metastatic pancreatic ca was  admitted with abdominal pain on 09/26/22 Pt was recently in [facility] between 7/8-7/11/24 with epigastric pain and weight loss and found to have pancreatic head mass and obstruction of the bile duct. 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 He was discharged home to follow up with oncology. He came to [facility] on 7/13 with worsening abdominal pain  Subjective: Pt not tolerating feeds beyond 30cc Abdominal discomfort  Medications:   amiodarone  200 mg Oral BID   apixaban  5 mg Oral BID   atorvastatin  40 mg Oral q morning   Chlorhexidine Gluconate Cloth  6 each Topical QHS   feeding supplement  237 mL Oral TID BM   feeding supplement (PROSource TF20)  60 mL Per Tube Daily   free water  30 mL Per Tube Q4H   insulin aspart  0-9 Units Subcutaneous Q4H   insulin glargine-yfgn  8 Units Subcutaneous Daily   midodrine  10 mg Oral TID WC   mirtazapine  15 mg Oral QHS   multivitamin with minerals  1 tablet Oral Daily   mouth rinse  15 mL Mouth Rinse 4 times per day   polyethylene glycol  17 g Oral Daily   senna-docusate  1 tablet Oral BID   simethicone  80 mg Oral QID   sodium chloride flush  5 mL Intracatheter Q8H    Objective: Vital signs  in last 24 hours: Patient Vitals for the past 24 hrs:  BP Temp Temp src Pulse Resp SpO2 Weight  10/09/22 2002 107/66 98.2 F (36.8 C) Oral 92 20 92 % --  10/09/22 1627 109/66 97.7 F (36.5 C) -- 85 18 92 % --  10/09/22 1221 109/62 98.8 F (37.1 C) -- 93 18 (!) 78 % --  10/09/22 0833 110/67 98.3 F (36.8 C) -- 87 18 -- --  10/09/22 0333 102/64 98.4 F (36.9 C) Oral 87 18 96 % 90.9 kg  10/09/22 0003 105/63 98.8 F (37.1 C) Oral 84 18 97 % --     PHYSICAL EXAM:  General: awake and alert NG tube.  Lungs: b/la ir entry. Heart:irregular. Abdomen: rt upper quadrant drains X 2, some distension Extremities: atraumatic, no cyanosis. No edema. No clubbing Skin: No rashes or lesions. Or bruising Lymph: Cervical, supraclavicular normal. Neurologic: Grossly non-focal  Lab Results    Latest Ref Rng & Units 10/09/2022    4:37 AM 10/08/2022    4:37 AM 10/07/2022    5:56 AM  CBC  WBC 4.0 - 10.5 K/uL 19.6  17.6  18.8   Hemoglobin 13.0 - 17.0 g/dL 9.3  47.8  29.5   Hematocrit 39.0 - 52.0 % 27.6  29.2  28.0  Platelets 150 - 400 K/uL 263  257  232        Latest Ref Rng & Units 10/09/2022    4:37 AM 10/08/2022    4:37 AM 10/07/2022    5:56 AM  CMP  Glucose 70 - 99 mg/dL 161  096  045   BUN 8 - 23 mg/dL 30  29  35   Creatinine 0.61 - 1.24 mg/dL 4.09  8.11  9.14   Sodium 135 - 145 mmol/L 128  129  126   Potassium 3.5 - 5.1 mmol/L 4.2  4.1  3.5   Chloride 98 - 111 mmol/L 98  97  97   CO2 22 - 32 mmol/L 24  26  23    Calcium 8.9 - 10.3 mg/dL 7.6  7.5  7.4       Microbiology: Subcapsular abscess fluid culture- citrobacter, morganella and enterococcus Studies/Results: DG Abd 1 View  Result Date: 10/09/2022 CLINICAL DATA:  Abdominal pain.  History of pancreatic cancer. EXAM: ABDOMEN - 1 VIEW COMPARISON:  CT 10/05/2022 FINDINGS: Tip of the weighted enteric tube in the right upper quadrant in the region of the distal stomach. There is gaseous gastric distension. Tube biliary drains and a  biliary stent are in place. Small to moderate volume of stool in the colon. Increased air throughout mildly dilated small bowel in the central abdomen. Pelvic calcifications consistent with phleboliths. Vascular calcifications are seen. IMPRESSION: 1. Tip of the weighted enteric tube in the right upper quadrant in the region of the distal stomach. Gaseous gastric distension. 2. Increased air throughout mildly dilated small bowel in the central abdomen, may be ileus or enteritis. Electronically Signed   By: Narda Rutherford M.D.   On: 10/09/2022 13:09     Assessment/Plan: Metastatic pancreatic cancer involving head of the pancrease wit bile duct stricture s/p ERCP and stent placement. Gall bladder distension secondary to obstruction of main bile duct leading to infection has perc cholecystostomy   Subcapsular abscess- s/p drain- multiple organisms in culture including citrobacter, morganella and enterococcus pt is currently on Meropenem ( day 4) . Previously was on zosyn Worsening leucocytosis could be from the malignancy itself especially with SMV. Portal vein compression Recommend repeating CT abdomen Would continue same antibiotic until imaging   Afib- ? ? LADA- type I diabetes   AKI improving? ____________________________ Discussed with patient and wife at bed side Discussed with hospitalist [facility] on call this weekend- available by phone for urgent issues

## 2022-10-09 NOTE — Progress Notes (Signed)
PROGRESS NOTE    Cameron Gutierrez  GNF:[id number] DOB: Jan 25, 1946 DOA: 09/26/2022 PCP: Dondra Spry, MD    Brief Narrative:  77 yo male who presented to [facility] ER on 07/13 with acute onset of severe abdominal/chest pain.  He was previously admitted at [facility] from 09/21/22-09/25/22 for abdominal pain along with unintentional weight loss.  During hospitalization at [facility] workup concerning for primary metastatic pancreatic adenocarcinoma.  He underwent ERCP/US with biopsy and stent placed on 09/23/22.  His pain was controlled post procedure, and pt discharged home on 09/25/22 (prescribed oxycodone and eliquis resumed).  Upon arrival to [facility] ER significant lab results were: Na+ 133/K+ 3.4/glucose 148/calcium 7.4/albumin 2.2/wbc 15.7.  CT Abd Pelvis concerning for moderate distension of the gallbladder but no findings of acute cholecystitis, however pt continued to c/o persistent abdominal pain.  EDP consulted with [facility] Oncologist Dr. Everardo All, and following review of imaging did not feel pt required transfer to [facility].  Pt subsequently admitted to [facility] medsurg unit per hospitalist team for additional workup and treatment due to continued abdominal pain.    Significant Hospital events :    07/13: Pt admitted to the medsurg unit with diffuse abdominal pain following recent ERCP with stent placement and newly diagnosed primary pancreatic adenocarcinoma with metastasis  [place] Abd Limited RUQ revealed Gallbladder sludge with mild gallbladder wall thickening and small volume pericholecystic fluid. Positive sonographic Murphy sign. Findings are concerning for acute acalculous cholecystitis. Liver metastases seen on CT are not well identified by ultrasound.   07/14: General surgery consulted for acute acalculous cholecystitis and recommended decompression of the gallbladder via percutaneous cholecystostomy per IR and continue abx therapy.    07/15: Pt arrived to special recovery for cholecystostomy drain placement and  pt noted to be in atrial fibrillation with rvr heart rate 140 to 170's.  Cardiology notified and the pt received 5 mg of iv cardizem x1 dose/250 ml NS bolus/20 mg of iv lasix.  Procedure canceled and pt transferred to the stepdown unit for cardizem gtt.  PCCM team consulted to assist with management.     09/29/22 patient in no distress, met with family today.  Awaiting percutaneous cholecystostomy and thoracentesis.  Testing for TLS today.   09/30/22 patient with emphysematous cholecystitis and pneumoperitoneum.  S/p throacentesis with partial clinical improvement.  Plan for IR consultation and cholecystostomy tube, completed by IR.     10/01/22 patient remains weak and chronically ill appearing.  He is s/p GB drain placement by IR and plan today to remove NGT and start PO nourishment.  He is being seen by palliative cancer specialist.   10/02/22 patient had BM overnight no longer with bowel obstruction does have NG still. Remains in AF on heparin, but vitals improved with low dose neo now. Palliative oncology following.  Patient wishes to have cancer therapy if possible. WBC count is normalized today.   10/03/22 : As patient was off vasopressors care was transitioned to hospitalist service.  No overnight events as per nursing.  Blood pressure remained stable off pressors with few soft readings.  Improvement in renal functions.  Nephrology following.  Patient tolerating full liquid diet.  NG can come out tomorrow.   10/04/2022: The patient was seen and examined at his bedside.  His wife was present in the room.  Passed a swallow evaluation.  Okay to do soft diet.  Will repeat LFTs and lipase level.  Will closely monitor to ensure tolerance to soft diet.   10/05/22:  Tolerating a soft  diet well.  NG tube removed.  CT scan ab/p wo contrast ordered due to up-trending leukocytosis.  ID consulted to assist with the management.   10/06/22: The patient was seen and examined at his bedside.  His son was present in  the room.  His leukocytosis continues to uptrend.  Started Merrem and DC'd the Zosyn today.  Appreciate infectious disease assistance.  7/24: No acute events overnight.  Oral intake remains poor.  Discussed with SLP and RD.  Plan to place image guided Dobbhoff and initiate tube feeds due to malnutrition  7/25: Transferred out of ICU.  Dobbhoff in place.  Tube feeds initiated.   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  900 cc removed.  Oxygen status improved.  Now on 2 L. Plan: Continue oxygen via nasal cannula wean as tolerated Pulmonary toileting Encourage incentive spirometry and flutter use Bronchodilators  Acute Kidney Injury -KDIGO 3 Multifactorial including ischemic/metabolic Off IV fluid. Holding home lisinopril Continue Foley Nephrology following   Acute urinary retention s/p foley cath placement on 09/28/22 Initially difficult to insert follow-up with urology Maintain Foley catheter for now   Permanent atrial fibrillation with rvr  Resolved hypotension secondary to antiarrhythmic medication  Patient transition to p.o. amiodarone and heparin gtt. on 7/24 Plan: Amiodarone 200 mg p.o. twice daily Eliquis 5 mg twice daily   #Acalculous emphysematous cholecystitis: Status post decompression now with JP drain in place. Elevated white cell count Currently on meropenem White count continues to rise, unclear  etiology Plan: Continue Merrem ID follow-up Daily labs   #Primary metastatic adenocarcinoma of the pancreas  - Oncology consulted appreciate input  Poor prognosis Patient would like to attempt to optimize nutritional status to see if he would be a candidate for any ongoing therapies.  NG tube now in place.  Tube feeds.  Patient unable to tolerate escalation past 30.  Will likely need bowel regimen.  Order KUB.   #Type I diabetes mellitus with hyperglycemia Hemoglobin A1c 7.0 on 09/26/2022. - CBG's ac/hs - SSI    Physical debility Continue PT OT with assistance and fall precautions. Fall precautions.   Moderate protein calorie malnutrition BMI 25 Moderate muscle mass loss Encourage oral intake and increasing protein calorie intake. Dobbhoff tube placed 7/24 RD consulted for tube feeds  DVT prophylaxis: Eliquis Code Status: DNR Family Communication: Spouse at bedside 7/24, 7/25, 7/26 Disposition Plan: Status is: Inpatient Remains inpatient appropriate because: Multiple acute issues as above   Level of care: Progressive  Consultants:  ID Cardiology  Procedures:  Thoracentesis  Antimicrobials: Meropenem   Subjective: Seen and examined.  Resting in bed.  Wife at bedside.  Answer questions appropriately.  Appears fatigued  Objective: Vitals:   10/08/22 2006 10/09/22 0003 10/09/22 0333 10/09/22 0833  BP: (!) 104/57 105/63 102/64 110/67  Pulse: 91 84 87 87  Resp: 18 18 18 18   Temp: 98.4 F (36.9 C) 98.8 F (37.1 C) 98.4 F (36.9 C) 98.3 F (36.8 C)  TempSrc: Oral Oral  Oral   SpO2: 96% 97% 96%   Weight:   90.9 kg   Height:        Intake/Output Summary (Last 24 hours) at 10/09/2022 1136 Last data filed at 10/09/2022 1035 Gross per 24 hour  Intake 1350 ml  Output 605 ml  Net 745 ml   Filed Weights   10/06/22 0500 10/07/22 0500 10/09/22 0333  Weight: 89.8 kg 88.8 kg 90.9 kg    Examination:  General exam: Appears chronically ill and  fatigued Respiratory system: Bibasilar crackles.  Normal work of breathing.  2 L.  NG tube in place Cardiovascular system: S1-S2, RRR, no murmur, no pedal edema Gastrointestinal system: Thin, soft, NT/ND, normal bowel sounds Central nervous system: Alert and oriented. No focal neurological deficits. Extremities: Decreased power bilateral lower extremities.  Gait not assessed Skin: No rashes, lesions or ulcers Psychiatry: Judgement and insight appear normal. Mood & affect appropriate.     Data Reviewed: I have personally reviewed following labs and imaging studies  CBC: Recent Labs  Lab 10/05/22 0631 10/06/22 0619 10/07/22 0556 10/08/22 0437 10/09/22 0437  WBC 17.7* 19.4* 18.8* 17.6* 19.6*  NEUTROABS  --   --   --  15.9* 17.8*  HGB 11.1* 10.2* 10.1* 10.0* 9.3*  HCT 31.5* 29.2* 28.0* 29.2* 27.6*  MCV 78.6* 80.7 78.9* 81.6 83.1  PLT 201 213 232 257 263   Basic Metabolic Panel: Recent Labs  Lab 10/05/22 0631 10/05/22 0633 10/06/22 0619 10/07/22 0556 10/08/22 0437 10/09/22 0437  NA 130* 129* 127* 126* 129* 128*  K 3.4* 3.3* 3.8 3.5 4.1 4.2  CL 99 99 96* 97* 97* 98  CO2 20* 19* 22 23 26 24   GLUCOSE 120* 119* 142* 172* 180* 166*  BUN 43* 48* 44* 35* 29* 30*  CREATININE 1.71* 1.65* 1.60* 1.47* 1.28* 1.10  CALCIUM 7.4* 7.3* 7.2* 7.4* 7.5* 7.6*  MG 2.4  --  2.4 2.2 2.3 2.1  PHOS  --  3.0 3.5 2.7 2.0* 2.6   GFR: Estimated Creatinine Clearance: 63.7 mL/min (by C-G formula based on SCr of 1.1 mg/dL). Liver Function Tests: Recent Labs  Lab 10/03/22 0408 10/04/22 0358 10/05/22 0631 10/05/22 0633 10/06/22 0619  AST  --  607* 284*  --  138*  ALT  --  288* 198*  --  139*  ALKPHOS  --  767* 631*  --  512*  BILITOT  --  1.6* 1.9*  --  1.7*  PROT  --  4.8* 5.2*  --  4.9*  ALBUMIN 1.9* 1.9*  1.8* 2.0* 1.9* 1.7*   Recent Labs  Lab 10/04/22 0358  LIPASE 29   No results for input(s): "AMMONIA" in the last 168 hours. Coagulation Profile: No results for input(s): "INR",  "PROTIME" in the last 168 hours. Cardiac Enzymes: No results for input(s): "CKTOTAL", "CKMB", "CKMBINDEX", "TROPONINI" in the last 168 hours. BNP (last 3 results) No results for input(s): "PROBNP" in the last 8760 hours. HbA1C: No results for input(s): "HGBA1C" in the last 72 hours. CBG: Recent Labs  Lab 10/08/22 1633 10/08/22 2007 10/09/22 0004 10/09/22 0334 10/09/22 0833  GLUCAP 207* 190* 172* 173* 168*   Lipid Profile: No results for input(s): "CHOL", "HDL", "LDLCALC", "TRIG", "CHOLHDL", "LDLDIRECT" in the last 72 hours. Thyroid Function Tests: No results for input(s): "TSH", "T4TOTAL", "FREET4", "T3FREE", "THYROIDAB" in the last 72 hours. Anemia Panel: No results for input(s): "VITAMINB12", "FOLATE", "FERRITIN", "TIBC", "IRON", "RETICCTPCT" in the last 72 hours. Sepsis Labs: No results for input(s): "PROCALCITON", "LATICACIDVEN" in  the last 168 hours.   Recent Results (from the past 240 hour(s))  Aerobic/Anaerobic Culture w Gram Stain (surgical/deep wound)     Status: None   Collection Time: 09/30/22 12:51 PM   Specimen: Gallbladder; Abscess  Result Value Ref Range Status   Specimen Description   Final    GALL BLADDER Performed at [facility], [address], [place], [place] [address]    Special Requests   Final    NONE Performed at [facility], [address] [place], [place] [address]    Gram Stain   Final    NO WBC SEEN FEW GRAM VARIABLE ROD RARE GRAM POSITIVE COCCI IN PAIRS    Culture   Final    RARE CITROBACTER FREUNDII RARE MORGANELLA MORGANII ABUNDANT ENTEROCOCCUS FAECALIS NO ANAEROBES ISOLATED Performed at [facility], [address], [place], [place] [address]    Report Status 10/05/2022 FINAL  Final   Organism ID, Bacteria CITROBACTER FREUNDII  Final   Organism ID, Bacteria MORGANELLA MORGANII  Final   Organism ID, Bacteria ENTEROCOCCUS FAECALIS  Final      Susceptibility   Citrobacter freundii - MIC*    CEFEPIME  <=0.12 SENSITIVE Sensitive     CEFTAZIDIME <=1 SENSITIVE Sensitive     CEFTRIAXONE <=0.25 SENSITIVE Sensitive     CIPROFLOXACIN <=0.25 SENSITIVE Sensitive     GENTAMICIN <=1 SENSITIVE Sensitive     IMIPENEM <=0.25 SENSITIVE Sensitive     TRIMETH/SULFA <=20 SENSITIVE Sensitive     PIP/TAZO <=4 SENSITIVE Sensitive     * RARE CITROBACTER FREUNDII   Enterococcus faecalis - MIC*    AMPICILLIN <=2 SENSITIVE Sensitive     VANCOMYCIN 2 SENSITIVE Sensitive     GENTAMICIN SYNERGY SENSITIVE Sensitive     * ABUNDANT ENTEROCOCCUS FAECALIS   Morganella morganii - MIC*    AMPICILLIN >=32 RESISTANT Resistant     CEFTAZIDIME <=1 SENSITIVE Sensitive     CIPROFLOXACIN <=0.25 SENSITIVE Sensitive     GENTAMICIN <=1 SENSITIVE Sensitive     IMIPENEM 4 SENSITIVE Sensitive     TRIMETH/SULFA <=20 SENSITIVE Sensitive     AMPICILLIN/SULBACTAM >=32 RESISTANT Resistant     PIP/TAZO <=4 SENSITIVE Sensitive     * RARE MORGANELLA MORGANII  Culture, blood (Routine X 2) w Reflex to ID Panel     Status: None (Preliminary result)   Collection Time: 10/05/22  6:31 AM   Specimen: BLOOD LEFT HAND  Result Value Ref Range Status   Specimen Description BLOOD LEFT HAND  Final   Special Requests   Final    BOTTLES DRAWN AEROBIC AND ANAEROBIC Blood Culture results may not be optimal due to an inadequate volume of blood received in culture bottles   Culture   Final    NO GROWTH 4 DAYS Performed at [facility], [address] [place], [place] [address]    Report Status PENDING  Incomplete  Culture, blood (Routine X 2) w Reflex to ID Panel     Status: None (Preliminary result)   Collection Time: 10/05/22  6:31 AM   Specimen: BLOOD RIGHT HAND  Result Value Ref Range Status   Specimen Description BLOOD RIGHT HAND  Final   Special Requests   Final    BOTTLES DRAWN AEROBIC AND ANAEROBIC Blood Culture adequate volume   Culture   Final    NO GROWTH 4 DAYS Performed at [facility], [address],  [place], [place] [address]    Report Status PENDING  Incomplete         Radiology Studies: DG Basil Dess Tube Plc W/Fl W/Rad  Result Date: 10/07/2022 CLINICAL DATA:  Provided history: Dysphagia. Request received for NG tube placement under fluoroscopy. EXAM: NASO G TUBE PLACEMENT WITH FL AND WITH RAD CONTRAST:  None FLUOROSCOPY: Radiation Exposure Index (if provided by the fluoroscopic device): 6.30 mGy air kerma COMPARISON:  CT abdomen/pelvis 10/05/2022. FINDINGS: The tip of the enteric tube was lubricated and gently inserted into the right nostril. Under fluoroscopic guidance, the tube easily advanced into the esophagus and subsequently into the stomach. The tip of the nasogastric tube reach the gastric antrum. The procedure was performed by Alwyn Ren NP, supervised by Dr. Jackey Loge. IMPRESSION: Technically successful fluoroscopically-guided placement of a 10 French nasogastric feeding tube. The tip of the tube is located within the gastric antrum. Electronically Signed   By: Jackey Loge D.O.   On: 10/07/2022 16:05        Scheduled Meds:  amiodarone  200 mg Oral BID   apixaban  5 mg Oral BID   atorvastatin  40 mg Oral q morning   Chlorhexidine Gluconate Cloth  6 each Topical QHS   docusate sodium  100 mg Oral BID   feeding supplement  237 mL Oral TID BM   feeding supplement (PROSource TF20)  60 mL Per Tube Daily   free water  30 mL Per Tube Q4H   insulin aspart  0-9 Units Subcutaneous Q4H   midodrine  10 mg Oral TID WC   mirtazapine  15 mg Oral QHS   multivitamin with minerals  1 tablet Oral Daily   mouth rinse  15 mL Mouth Rinse 4 times per day   sodium chloride flush  5 mL Intracatheter Q8H   Continuous Infusions:  sodium chloride 250 mL (10/07/22 2153)   feeding supplement (VITAL 1.5 CAL) 1,000 mL (10/09/22 0051)   meropenem (MERREM) IV 1 g (10/09/22 0604)   promethazine (PHENERGAN) injection (IM or IVPB)       LOS: 12 days       Tresa Moore, MD [facility]   If 7PM-7AM, please contact night-coverage  10/09/2022, 11:36 AM

## 2022-10-09 NOTE — Progress Notes (Signed)
Occupational Therapy Treatment Patient Details Name: Cameron Gutierrez MRN: [id number] DOB: January 05, 1946 Today's Date: 10/09/2022   History of present illness 77 yo male who presented to [facility] ER on 07/13 with acute onset of severe abdominal/chest pain.  He was previously admitted at [facility] from 09/21/22-09/25/22 for abdominal pain along with unintentional weight loss.  During hospitalization at [facility] workup concerning for primary metastatic pancreatic adenocarcinoma.  He underwent ERCP/US with biopsy and stent placed on 09/23/22.  His pain was controlled post procedure, and pt discharged home on 09/25/22   OT comments  Upon entering the room, pt sleeping soundly in recliner chair with wife present in room. Pt has been up for several hours and reports fatigue. Tub feeding held while pt stands with min A and step pivot's to the R from chair to bed. Sit >supine with assistance for B LEs. +2 assistance to pull pt up further in bed for comfort. HOB elevated to 30 degrees and feedings started once again. Pt resting comfortably with call bell and all needed items within reach.    Recommendations for follow up therapy are one component of a multi-disciplinary discharge planning process, led by the attending physician.  Recommendations may be updated based on patient status, additional functional criteria and insurance authorization.    Assistance Recommended at Discharge Intermittent Supervision/Assistance  Patient can return home with the following  A lot of help with walking and/or transfers;A lot of help with bathing/dressing/bathroom;Assistance with cooking/housework;Assist for transportation;Help with stairs or ramp for entrance   Equipment Recommendations  Other (comment) (defer to next venue of care)       Precautions / Restrictions Precautions Precautions: Fall       Mobility Bed Mobility Overal bed mobility: Needs Assistance Bed Mobility: Sit to Supine       Sit to supine: Min  assist   General bed mobility comments: assistance for B LEs    Transfers Overall transfer level: Needs assistance Equipment used: Rolling walker (2 wheels) Transfers: Sit to/from Stand, Bed to chair/wheelchair/BSC Sit to Stand: Min assist     Step pivot transfers: Min assist           Balance Overall balance assessment: Needs assistance Sitting-balance support: Feet supported Sitting balance-Leahy Scale: Good     Standing balance support: Bilateral upper extremity supported, During functional activity, Reliant on assistive device for balance Standing balance-Leahy Scale: Fair                             ADL either performed or assessed with clinical judgement    Extremity/Trunk Assessment Upper Extremity Assessment Upper Extremity Assessment: Generalized weakness   Lower Extremity Assessment Lower Extremity Assessment: Generalized weakness        Vision Patient Visual Report: No change from baseline            Cognition Arousal/Alertness: Awake/alert Behavior During Therapy: Flat affect, WFL for tasks assessed/performed Overall Cognitive Status: Within Functional Limits for tasks assessed                                                     Pertinent Vitals/ Pain       Pain Assessment Pain Assessment: No/denies pain  Home Living Family/patient expects to be discharged to:: Private residence Living Arrangements: Spouse/significant other Available Help  at Discharge: Family;Available 24 hours/day Type of Home: House Home Access: Stairs to enter [employer] of Steps: 1 Entrance Stairs-Rails: None Home Layout: One level     Bathroom Shower/Tub: Tub/shower unit         Home Equipment: Agricultural consultant (2 wheels);Cane - single point              Frequency  Min 1X/week        Progress Toward Goals  OT Goals(current goals can now be found in the care plan section)  Progress towards OT goals:  Progressing toward goals     Plan Discharge plan remains appropriate;Frequency remains appropriate       AM-PAC OT "6 Clicks" Daily Activity     Outcome Measure   Help from another person eating meals?: None Help from another person taking care of personal grooming?: A Little Help from another person toileting, which includes using toliet, bedpan, or urinal?: A Lot Help from another person bathing (including washing, rinsing, drying)?: A Lot Help from another person to put on and taking off regular upper body clothing?: A Lot Help from another person to put on and taking off regular lower body clothing?: A Lot 6 Click Score: 15    End of Session Equipment Utilized During Treatment: Oxygen  OT Visit Diagnosis: Unsteadiness on feet (R26.81);Muscle weakness (generalized) (M62.81)   Activity Tolerance Patient limited by fatigue   Patient Left in bed;with call bell/phone within reach;with bed alarm set;with family/visitor present   Nurse Communication Mobility status        Time: 4098-1191 OT Time Calculation (min): 16 min  Charges: OT General Charges $OT Visit: 1 Visit OT Treatments $Therapeutic Activity: 8-22 mins  Jackquline Denmark, MS, OTR/L , CBIS ascom [phone]  10/09/22, 2:34 PM

## 2022-10-09 NOTE — Progress Notes (Signed)
Pharmacy Antibiotic Note  Cameron Gutierrez is a 77 y.o. male admitted on 09/26/2022 with  cholecystitis .  Pharmacy has been consulted for meropenem dosing.  Leukocytosis uptrending despite Zosyn.  ID consulted; aBax changed to meropenem.  Scr continued to improve 2.04>>1.28 today  Plan: Day#4 of meropenem therapy Change Meropenem 1 gram from 12 hours to q 8hrs (End date 8/31 per ID recommendations)  Height: 6' (182.9 cm) Weight: 90.9 kg (200 lb 6.4 oz) IBW/kg (Calculated) : 77.6  Temp (24hrs), Avg:98.4 F (36.9 C), Min:97.9 F (36.6 C), Max:98.8 F (37.1 C)  Recent Labs  Lab 10/05/22 0631 10/05/22 0633 10/06/22 0619 10/07/22 0556 10/08/22 0437 10/09/22 0437  WBC 17.7*  --  19.4* 18.8* 17.6* 19.6*  CREATININE 1.71* 1.65* 1.60* 1.47* 1.28* 1.10    Estimated Creatinine Clearance: 63.7 mL/min (by C-G formula based on SCr of 1.1 mg/dL).    No Known Allergies  Antimicrobials this admission: Zosyn 7/17 >> 7/23 Meropenem 7/23 >>    Microbiology results: 7/22 BCx: NG x 1 day 7/17 UCx: gall bladder: C. Freunii, Morganella morganii, E. faucalis   Thank you for allowing pharmacy to be a part of this patient's care.  Tariq Pernell Rodriguez-Guzman PharmD, BCPS 10/09/2022 7:49 AM

## 2022-10-09 NOTE — Progress Notes (Signed)
PT Cancellation Note  Patient Details Name: Cameron Gutierrez MRN: [id number] DOB: 16-Mar-1946   Cancelled Treatment:     PT attempt. X ray results reviewed.  Pt currently working with OT. PT will return tomorrow and continue to follow per current POC.    Rushie Chestnut 10/09/2022, 2:14 PM

## 2022-10-09 NOTE — Progress Notes (Signed)
Nutrition Follow-up  DOCUMENTATION CODES:   Severe malnutrition in context of chronic illness  INTERVENTION:   Vital 1.5@60ml /hr- continue tube feeds at 25ml/hr for now, once abdominal distension and pain is improved, begin increasing by 4ml/hr q 12 hours to goal rate.    ProSource TF 20- Give 60ml daily via tube, each supplement provides 80kcal and 20g of protein.    Free water flushes 30ml q4 hours to maintain tube patency    Regimen at goal rate provides 2240kcal/day, 117g/day protein and 1215ml/day.    Pt remains at high refeed risk; recommend monitor potassium, magnesium and phosphorus labs daily until stable   Daily weights   Ensure Enlive po TID, each supplement provides 350 kcal and 20 grams of protein.  NUTRITION DIAGNOSIS:   Severe Malnutrition related to chronic illness (newly diagnosed pancreatic cancer) as evidenced by mild fat depletion, moderate fat depletion, moderate muscle depletion, severe muscle depletion, percent weight loss, energy intake < or equal to 75% for > or equal to 1 month. 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  -Pt s/p fluoroscopy guided NGT placement 7/25  Received notification from RN today that patient with worsening abdominal distension and pain; pt has been unable to tolerate tube feeds above 56ml/hr. KUB obtained and reports gaseous gastric distension along with increased air throughout mildly dilated small  bowel in the central abdomen, may be ileus or enteritis. Pt documented to have eaten 100% of some scrambled eggs and pureed peaches for breakfast this morning. Pt is also drinking some Ensure. Bowel regimen and simethicone added by MD today. Will plan to resume tube feeds at 33ml/hr until pt is able to tolerate increasing to goal rate. If patient's oral intake improves, tube feeds can be adjusted down. Will plan to discontinue tube feeds once pt is able to consume consistent oral intake and meet at least 75% of his estimated needs. Tube feeds currently meeting ~50% of pt's estimated needs. Refeed labs wnl today.   Per chart, pt appears to be up ~25lbs since admission. Pt is noted to have edema and third spacing. Pt +6.8L on his I & Os. UOP decreased. MD aware.   Medications reviewed and include: insulin, remeron, miralax, senokot, simethicone, MVI, meropenem  Labs reviewed: Na 128(L), K 4.2 wnl, BUN 30(H), P 2.6 wnl, Mg 2.1 wnl Wbc- 19.6(H), Hgb 9.3(L), Hct 27.6(L) Cbgs- 154, 168, 173, 172 x 24 hrs   Drains- 80ml   UOP-   Diet Order:   Diet Order             DIET DYS 2 Room service appropriate? Yes; Fluid consistency: Thin  Diet effective now                  EDUCATION NEEDS:   Education needs have been addressed  Skin:  Skin Assessment: Reviewed RN Assessment  Last BM:  7/23- type 6  Height:   Ht Readings from Last 1 Encounters:  10/03/22 6' (1.829 m)  Weight:   Wt Readings from Last 1 Encounters:  10/09/22 90.9 kg    Ideal Body Weight:  80.9 kg  BMI:  Body mass index is 27.18 kg/m.  Estimated Nutritional Needs:   Kcal:  2200-2500kcal/day  Protein:  110-125 grams  Fluid:  2.0-2.3L/day  Betsey Holiday MS, RD, LDN Please refer to [facility] for RD and/or RD on-call/weekend/after hours pager

## 2022-10-09 NOTE — Inpatient Diabetes Management (Signed)
Inpatient Diabetes Program Recommendations  AACE/ADA: New Consensus Statement on Inpatient Glycemic Control (2015)  Target Ranges:  Prepandial:   less than 140 mg/dL      Peak postprandial:   less than 180 mg/dL (1-2 hours)      Critically ill patients:  140 - 180 mg/dL   Lab Results  Component Value Date   GLUCAP 168 (H) 10/09/2022   HGBA1C 7.0 (H) 09/26/2022    Review of Glycemic Control  Latest Reference Range & Units 10/08/22 11:53 10/08/22 16:33 10/08/22 20:07 10/09/22 00:04 10/09/22 03:34 10/09/22 08:33  Glucose-Capillary 70 - 99 mg/dL 161 (H) 096 (H) 045 (H) 172 (H) 173 (H) 168 (H)   Diabetes history: DM 1 (LADA) Outpatient Diabetes medications:  Lantus 18 units daily Current orders for Inpatient glycemic control:  Novolog 0-9 units q 4 hours Vital 60 ml/hr   Inpatient Diabetes Program Recommendations:    Consider adding Semglee 8 units daily.   Thanks,  Beryl Meager, RN, BC-ADM Inpatient Diabetes Coordinator Pager [phone]  (8a-5p)

## 2022-10-09 NOTE — Consult Note (Signed)
PHARMACY CONSULT NOTE - FOLLOW UP  Pharmacy Consult for Electrolyte Monitoring and Replacement   Recent Labs: Potassium (mmol/L)  Date Value  10/09/2022 4.2  09/24/2013 3.8   Magnesium (mg/dL)  Date Value  04/54/0981 2.1   Calcium (mg/dL)  Date Value  19/14/7829 7.6 (L)   Calcium, Total (mg/dL)  Date Value  56/21/3086 8.8   Albumin (g/dL)  Date Value  57/84/6962 1.7 (L)   Phosphorus (mg/dL)  Date Value  95/28/4132 2.6   Sodium (mmol/L)  Date Value  10/09/2022 128 (L)  04/09/2021 141  09/24/2013 141    Assessment: 77 y.o. male with recently diagnosed advanced stage pancreatic cancer complicated by common bile duct stenosis status post ERCP with covered common bile duct stent placement on 09/23/22 at [facility].  He presented to the [facility] ED 2 days later with abdominal pain and dyspnea.  Imaging has shown progressive cholecystitis, now with possible emphysematous changes and perforation with perihepatic fluid.  Pharmacy has been consulted to monitor and replace electrolytes.  Goal of Therapy:  Electrolytes WNL  Plan:  All electrolytes within normal limits. No replacement needed today. Follow AM labs and replace electrolytes as needed  Ananias Kolander Rodriguez-Guzman PharmD, BCPS 10/09/2022 7:51 AM

## 2022-10-10 ENCOUNTER — Inpatient Hospital Stay: Payer: Medicare Other

## 2022-10-10 DIAGNOSIS — C259 Malignant neoplasm of pancreas, unspecified: Secondary | ICD-10-CM | POA: Diagnosis not present

## 2022-10-10 LAB — GLUCOSE, CAPILLARY
Glucose-Capillary: 139 mg/dL — ABNORMAL HIGH (ref 70–99)
Glucose-Capillary: 143 mg/dL — ABNORMAL HIGH (ref 70–99)
Glucose-Capillary: 144 mg/dL — ABNORMAL HIGH (ref 70–99)
Glucose-Capillary: 146 mg/dL — ABNORMAL HIGH (ref 70–99)
Glucose-Capillary: 157 mg/dL — ABNORMAL HIGH (ref 70–99)
Glucose-Capillary: 159 mg/dL — ABNORMAL HIGH (ref 70–99)
Glucose-Capillary: 163 mg/dL — ABNORMAL HIGH (ref 70–99)

## 2022-10-10 MED ORDER — FUROSEMIDE 10 MG/ML IJ SOLN
40.0000 mg | Freq: Once | INTRAMUSCULAR | Status: AC
  Administered 2022-10-10: 40 mg via INTRAVENOUS
  Filled 2022-10-10: qty 4

## 2022-10-10 MED ORDER — ACETAMINOPHEN 500 MG PO TABS
500.0000 mg | ORAL_TABLET | Freq: Every day | ORAL | Status: DC
  Administered 2022-10-10 – 2022-10-12 (×3): 500 mg via ORAL
  Filled 2022-10-10 (×3): qty 1

## 2022-10-10 MED ORDER — POLYETHYLENE GLYCOL 3350 17 G PO PACK: 17.0000 g | PACK | Freq: Every day | ORAL | Status: DC | PRN

## 2022-10-10 NOTE — Consult Note (Signed)
PHARMACY CONSULT NOTE - FOLLOW UP  Pharmacy Consult for Electrolyte Monitoring and Replacement   Recent Labs: Potassium (mmol/L)  Date Value  10/10/2022 4.7  09/24/2013 3.8   Magnesium (mg/dL)  Date Value  95/63/8756 2.1   Calcium (mg/dL)  Date Value  43/32/9518 7.8 (L)   Calcium, Total (mg/dL)  Date Value  84/16/6063 8.8   Albumin (g/dL)  Date Value  01/60/1093 1.7 (L)   Phosphorus (mg/dL)  Date Value  23/55/7322 2.8   Sodium (mmol/L)  Date Value  10/10/2022 127 (L)  04/09/2021 141  09/24/2013 141    Assessment: 77 y.o. male with recently diagnosed advanced stage pancreatic cancer complicated by common bile duct stenosis status post ERCP with covered common bile duct stent placement on 09/23/22 at [facility].  He presented to the [facility] ED 2 days later with abdominal pain and dyspnea.  Imaging has shown progressive cholecystitis, now with possible emphysematous changes and perforation with perihepatic fluid.  Pharmacy has been consulted to monitor and replace electrolytes.  Goal of Therapy:  Electrolytes within normal limits  Plan:  --Mild hyponatremia, continue to monitor --No electrolyte replacement warranted at this time --Given overall stability of labs, will discontinue electrolyte consult at this time. Defer further ordering of labs and electrolyte replacement to primary team --Pharmacy will continue to follow along peripherally  Tressie Ellis 10/10/2022 7:53 AM

## 2022-10-10 NOTE — Progress Notes (Signed)
PROGRESS NOTE    Cameron Gutierrez  WUJ:[id number] DOB: [birth year] DOA: 09/26/2022 PCP: Dondra Spry, MD    Brief Narrative:  77 yo male who presented to [facility] ER on 07/13 with acute onset of severe abdominal/chest pain.  He was previously admitted at [facility] from 09/21/22-09/25/22 for abdominal pain along with unintentional weight loss.  During hospitalization at [facility] workup concerning for primary metastatic pancreatic adenocarcinoma.  He underwent ERCP/US with biopsy and stent placed on 09/23/22.  His pain was controlled post procedure, and pt discharged home on 09/25/22 (prescribed oxycodone and eliquis resumed).  Upon arrival to [facility] ER significant lab results were: Na+ 133/K+ 3.4/glucose 148/calcium 7.4/albumin 2.2/wbc 15.7.  CT Abd Pelvis concerning for moderate distension of the gallbladder but no findings of acute cholecystitis, however pt continued to c/o persistent abdominal pain.  EDP consulted with [facility] Oncologist Dr. Everardo All, and following review of imaging did not feel pt required transfer to [facility].  Pt subsequently admitted to [facility] medsurg unit per hospitalist team for additional workup and treatment due to continued abdominal pain.    Significant Hospital events :    07/13: Pt admitted to the medsurg unit with diffuse abdominal pain following recent ERCP with stent placement and newly diagnosed primary pancreatic adenocarcinoma with metastasis  [place] Abd Limited RUQ revealed Gallbladder sludge with mild gallbladder wall thickening and small volume pericholecystic fluid. Positive sonographic Murphy sign. Findings are concerning for acute acalculous cholecystitis. Liver metastases seen on CT are not well identified by ultrasound.   07/14: General surgery consulted for acute acalculous cholecystitis and recommended decompression of the gallbladder via percutaneous cholecystostomy per IR and continue abx therapy.    07/15: Pt arrived to special recovery for cholecystostomy drain placement and  pt noted to be in atrial fibrillation with rvr heart rate 140 to 170's.  Cardiology notified and the pt received 5 mg of iv cardizem x1 dose/250 ml NS bolus/20 mg of iv lasix.  Procedure canceled and pt transferred to the stepdown unit for cardizem gtt.  PCCM team consulted to assist with management.     09/29/22 patient in no distress, met with family today.  Awaiting percutaneous cholecystostomy and thoracentesis.  Testing for TLS today.   09/30/22 patient with emphysematous cholecystitis and pneumoperitoneum.  S/p throacentesis with partial clinical improvement.  Plan for IR consultation and cholecystostomy tube, completed by IR.     10/01/22 patient remains weak and chronically ill appearing.  He is s/p GB drain placement by IR and plan today to remove NGT and start PO nourishment.  He is being seen by palliative cancer specialist.   10/02/22 patient had BM overnight no longer with bowel obstruction does have NG still. Remains in AF on heparin, but vitals improved with low dose neo now. Palliative oncology following.  Patient wishes to have cancer therapy if possible. WBC count is normalized today.   10/03/22 : As patient was off vasopressors care was transitioned to hospitalist service.  No overnight events as per nursing.  Blood pressure remained stable off pressors with few soft readings.  Improvement in renal functions.  Nephrology following.  Patient tolerating full liquid diet.  NG can come out tomorrow.   10/04/2022: The patient was seen and examined at his bedside.  His wife was present in the room.  Passed a swallow evaluation.  Okay to do soft diet.  Will repeat LFTs and lipase level.  Will closely monitor to ensure tolerance to soft diet.   10/05/22:  Tolerating a soft  diet well.  NG tube removed.  CT scan ab/p wo contrast ordered due to up-trending leukocytosis.  ID consulted to assist with the management.   10/06/22: The patient was seen and examined at his bedside.  His son was present in  the room.  His leukocytosis continues to uptrend.  Started Merrem and DC'd the Zosyn today.  Appreciate infectious disease assistance.  7/24: No acute events overnight.  Oral intake remains poor.  Discussed with SLP and RD.  Plan to place image guided Dobbhoff and initiate tube feeds due to malnutrition  7/25: Transferred out of ICU.  Dobbhoff in place.  Tube feeds initiated.   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  900 cc removed.   Poor respiratory effort  Weight up since admission  plan: Continue oxygen via nasal cannula wean as tolerated Pulmonary toileting Encourage incentive spirometry and flutter use Bronchodilators Check CXR Consider diuretic challenge  Acute Kidney Injury -KDIGO 3 Multifactorial including ischemic/metabolic Off IV fluid. Holding home lisinopril Continue Foley   Acute urinary retention s/p foley cath placement on 09/28/22 Initially difficult to insert follow-up with urology Maintain Foley catheter for now   Permanent atrial fibrillation with rvr  Resolved hypotension secondary to antiarrhythmic medication  Patient transition to p.o. amiodarone and heparin gtt. on 7/24 Plan: Amiodarone 200 mg p.o. twice daily Eliquis 5 mg twice daily   #Acalculous emphysematous cholecystitis: Status post decompression now with JP drain in place. Elevated white cell count Currently on meropenem White count  continues to rise, unclear etiology Likely related underlying malignancy Switch back to Zosyn per ID on 7/26 Plan: Continue Zosyn ID follow-up Daily labs Check CT abdomen without contrast   #Primary metastatic adenocarcinoma of the pancreas  - Oncology consulted appreciate input  Poor prognosis Patient would like to attempt to optimize nutritional status to see if he would be a candidate for any ongoing therapies.  NG tube now in place.  Tube feeds.  Patient unable to tolerate escalation past 30.  Continue bowel regimen.  Continue simethicone   #Type I diabetes mellitus with hyperglycemia Hemoglobin A1c 7.0 on 09/26/2022. - CBG's ac/hs - SSI    Physical debility Continue PT OT with assistance and fall precautions. Fall precautions.   Moderate protein calorie malnutrition BMI 25 Moderate muscle mass loss Encourage oral intake and increasing protein calorie intake. Dobbhoff tube placed 7/24 RD consulted for tube feeds  DVT prophylaxis: Eliquis Code Status: DNR Family Communication: Spouse at bedside 7/24, 7/25, 7/26, 7/27 Disposition Plan: Status is: Inpatient Remains inpatient appropriate because: Multiple acute issues as above   Level of care: Progressive  Consultants:  ID Cardiology  Procedures:  Thoracentesis  Antimicrobials: Zosyn   Subjective: Seen and examined.  Resting in bed.  States he had a rough night.  Poor sleep.  Wife at bedside.  Patient remains fatigued  Objective: Vitals:   10/10/22 0318 10/10/22 0804 10/10/22 0809 10/10/22 1143  BP: 95/62 101/71 102/70 106/64  Pulse: 85 84  75 (!) 55  Resp: 20  (!) 24 18  Temp: 98.7 F (37.1 C)  97.7 F (36.5 C) 97.9 F (36.6 C)  TempSrc: Oral     SpO2: 92% (!) 78%  (!) 80%  Weight: 89.9 kg     Height:        Intake/Output Summary (Last 24 hours) at 10/10/2022 1257 Last data filed at 10/10/2022 1000 Gross per 24 hour  Intake 2232.26 ml  Output 1765 ml  Net 467.26 ml   Filed Weights   10/07/22  0500 10/09/22 0333 10/10/22 0318  Weight: 88.8 kg 90.9 kg 89.9 kg    Examination:  General exam: Appears fatigued Respiratory system: Bibasilar crackles.  Normal work of breathing.  Poor respiratory effort.  4 L Cardiovascular system: S1-S2, RRR, no murmur, no pedal edema Gastrointestinal system: Thin, soft, NT/ND, normal bowel sounds Central nervous system: Alert and oriented. No focal neurological deficits. Extremities: Decreased power bilateral lower extremities.  Gait not assessed Skin: No rashes, lesions or ulcers Psychiatry: Judgement and insight appear normal. Mood & affect appropriate.     Data Reviewed: I have personally reviewed following labs and imaging studies  CBC: Recent Labs  Lab 10/06/22 0619 10/07/22 0556 10/08/22 0437 10/09/22 0437 10/10/22 0428  WBC 19.4* 18.8* 17.6* 19.6* 20.2*  NEUTROABS  --   --  15.9* 17.8* 17.9*  HGB 10.2* 10.1* 10.0* 9.3* 8.7*  HCT 29.2* 28.0* 29.2* 27.6* 25.0*  MCV 80.7 78.9* 81.6 83.1 81.4  PLT 213 232 257 263 266   Basic Metabolic Panel: Recent Labs  Lab 10/06/22 0619 10/07/22 0556 10/08/22 0437 10/09/22 0437 10/10/22 0428  NA 127* 126* 129* 128* 127*  K 3.8 3.5 4.1 4.2 4.7  CL 96* 97* 97* 98 98  CO2 22 23 26 24 22   GLUCOSE 142* 172* 180* 166* 174*  BUN 44* 35* 29* 30* 34*  CREATININE 1.60* 1.47* 1.28* 1.10 1.04  CALCIUM 7.2* 7.4* 7.5* 7.6* 7.8*  MG 2.4 2.2 2.3 2.1 2.1  PHOS 3.5 2.7 2.0* 2.6 2.8   GFR: Estimated Creatinine Clearance: 67.4 mL/min (by C-G formula based on SCr of 1.04 mg/dL). Liver Function Tests: Recent Labs  Lab 10/04/22 0358 10/05/22 0631 10/05/22 0633 10/06/22 0619  AST 607* 284*  --  138*  ALT 288* 198*  --  139*  ALKPHOS 767* 631*  --  512*  BILITOT 1.6* 1.9*  --  1.7*  PROT 4.8* 5.2*  --  4.9*  ALBUMIN 1.9*  1.8* 2.0* 1.9* 1.7*   Recent Labs  Lab 10/04/22 0358  LIPASE 29   No results for input(s): "AMMONIA" in the last 168 hours. Coagulation Profile: No results for input(s):  "INR", "PROTIME" in the last 168 hours. Cardiac Enzymes: No results for input(s): "CKTOTAL", "CKMB", "CKMBINDEX", "TROPONINI" in the last 168 hours. BNP (last 3 results) No results for input(s): "PROBNP" in the last 8760 hours. HbA1C: No results for input(s): "HGBA1C" in the last 72 hours. CBG: Recent Labs  Lab 10/09/22 2003 10/10/22 0008 10/10/22 0319 10/10/22 0805 10/10/22 1145  GLUCAP 145* 146* 139* 159* 144*   Lipid Profile: No results for input(s): "CHOL", "HDL", "LDLCALC", "TRIG", "CHOLHDL", "LDLDIRECT" in the last 72 hours. Thyroid Function Tests: No results for input(s): "TSH", "T4TOTAL", "FREET4", "T3FREE", "THYROIDAB" in the last 72 hours. Anemia Panel: No results for input(s): "VITAMINB12", "FOLATE", "FERRITIN", "TIBC", "IRON", "RETICCTPCT" in the last 72 hours. Sepsis Labs: No results for input(s): "PROCALCITON", "LATICACIDVEN" in the last 168 hours.   Recent Results (from  the past 240 hour(s))  Culture, blood (Routine X 2) w Reflex to ID Panel     Status: None   Collection Time: 10/05/22  6:31 AM   Specimen: BLOOD LEFT HAND  Result Value Ref Range Status   Specimen Description BLOOD LEFT HAND  Final   Special Requests   Final    BOTTLES DRAWN AEROBIC AND ANAEROBIC Blood Culture results may not be optimal due to an inadequate volume of blood received in culture bottles   Culture   Final    NO GROWTH 5 DAYS Performed at [facility], [address], [place], [place] [address]    Report Status 10/10/2022 FINAL  Final  Culture, blood (Routine X 2) w Reflex to ID Panel     Status: None   Collection Time: 10/05/22  6:31 AM   Specimen: BLOOD RIGHT HAND  Result Value Ref Range Status   Specimen Description BLOOD RIGHT HAND  Final   Special Requests   Final    BOTTLES DRAWN AEROBIC AND ANAEROBIC Blood Culture adequate volume   Culture   Final    NO GROWTH 5 DAYS Performed at [facility], [address], [place], [place] [address]    Report  Status 10/10/2022 FINAL  Final         Radiology Studies: DG Abd 1 View  Result Date: 10/09/2022 CLINICAL DATA:  Abdominal pain.  History of pancreatic cancer. EXAM: ABDOMEN - 1 VIEW COMPARISON:  CT 10/05/2022 FINDINGS: Tip of the weighted enteric tube in the right upper quadrant in the region of the distal stomach. There is gaseous gastric distension. Tube biliary drains and a biliary stent are in place. Small to moderate volume of stool in the colon. Increased air throughout mildly dilated small bowel in the central abdomen. Pelvic calcifications consistent with phleboliths. Vascular calcifications are seen. IMPRESSION: 1. Tip of the weighted enteric tube in the right upper quadrant in the region of the distal stomach. Gaseous gastric distension. 2. Increased air throughout mildly dilated small bowel in the central abdomen, may be ileus or enteritis. Electronically Signed   By: Narda Rutherford M.D.   On: 10/09/2022 13:09        Scheduled Meds:  acetaminophen  500 mg Oral QHS   amiodarone  200 mg Oral BID   apixaban  5 mg Oral BID   atorvastatin  40 mg Oral q morning   Chlorhexidine Gluconate Cloth  6 each Topical QHS   feeding supplement  237 mL Oral TID BM   feeding supplement (PROSource TF20)  60 mL Per Tube Daily   free water  30 mL Per Tube Q4H   insulin aspart  0-9 Units Subcutaneous Q4H   insulin glargine-yfgn  8 Units Subcutaneous Daily   midodrine  10 mg Oral TID WC   mirtazapine  15 mg Oral QHS   multivitamin with minerals  1 tablet Oral Daily   mouth rinse  15 mL Mouth Rinse 4 times per day   senna-docusate  1 tablet Oral BID   simethicone  80 mg Oral QID   sodium chloride flush  5 mL Intracatheter Q8H   Continuous Infusions:  sodium chloride 250 mL (10/07/22 2153)   feeding supplement (VITAL 1.5 CAL) 30 mL/hr at 10/10/22 0600   meropenem (MERREM) IV 200 mL/hr at 10/10/22 0600   promethazine (PHENERGAN) injection (IM or IVPB)       LOS: 13 days        Tresa Moore, MD [facility]   If 7PM-7AM, please contact night-coverage  10/10/2022, 12:57 PM

## 2022-10-10 NOTE — Progress Notes (Addendum)
PT Cancellation Note  Patient Details Name: [name] MRN: [id number] DOB: 1945-12-18   Cancelled Treatment:     PT attempt. Pt endorses having rough night and request author return later this afternoon. Acute PT will continue to follow and progress per current POC. Will return later today as requested.  1400: Pt sound asleep. Does awake but unable to stay awake. Unable to safely participate in PT at this time. Will continue to follow and progress as able per current POC.     Rushie Chestnut 10/10/2022, 9:22 AM

## 2022-10-11 DIAGNOSIS — C259 Malignant neoplasm of pancreas, unspecified: Secondary | ICD-10-CM | POA: Diagnosis not present

## 2022-10-11 LAB — CBC WITH DIFFERENTIAL/PLATELET
Abs Immature Granulocytes: 0.22 10*3/uL — ABNORMAL HIGH (ref 0.00–0.07)
Basophils Absolute: 0.1 10*3/uL (ref 0.0–0.1)
Basophils Relative: 0 %
Eosinophils Absolute: 0 10*3/uL (ref 0.0–0.5)
Eosinophils Relative: 0 %
HCT: 24.7 % — ABNORMAL LOW (ref 39.0–52.0)
Hemoglobin: 8.6 g/dL — ABNORMAL LOW (ref 13.0–17.0)
Immature Granulocytes: 1 %
Lymphocytes Relative: 6 %
Lymphs Abs: 1 10*3/uL (ref 0.7–4.0)
MCH: 28.9 pg (ref 26.0–34.0)
MCHC: 34.8 g/dL (ref 30.0–36.0)
MCV: 82.9 fL (ref 80.0–100.0)
Monocytes Absolute: 0.9 10*3/uL (ref 0.1–1.0)
Monocytes Relative: 5 %
Neutro Abs: 14.6 10*3/uL — ABNORMAL HIGH (ref 1.7–7.7)
Neutrophils Relative %: 88 %
Platelets: 234 10*3/uL (ref 150–400)
RBC: 2.98 MIL/uL — ABNORMAL LOW (ref 4.22–5.81)
RDW: 16.2 % — ABNORMAL HIGH (ref 11.5–15.5)
WBC: 16.7 10*3/uL — ABNORMAL HIGH (ref 4.0–10.5)
nRBC: 0 % (ref 0.0–0.2)

## 2022-10-11 LAB — GLUCOSE, CAPILLARY
Glucose-Capillary: 129 mg/dL — ABNORMAL HIGH (ref 70–99)
Glucose-Capillary: 133 mg/dL — ABNORMAL HIGH (ref 70–99)
Glucose-Capillary: 140 mg/dL — ABNORMAL HIGH (ref 70–99)
Glucose-Capillary: 143 mg/dL — ABNORMAL HIGH (ref 70–99)
Glucose-Capillary: 145 mg/dL — ABNORMAL HIGH (ref 70–99)
Glucose-Capillary: 153 mg/dL — ABNORMAL HIGH (ref 70–99)

## 2022-10-11 LAB — BASIC METABOLIC PANEL WITH GFR
Anion gap: 8 (ref 5–15)
BUN: 38 mg/dL — ABNORMAL HIGH (ref 8–23)
CO2: 22 mmol/L (ref 22–32)
Calcium: 7.7 mg/dL — ABNORMAL LOW (ref 8.9–10.3)
Chloride: 100 mmol/L (ref 98–111)
Creatinine, Ser: 1.09 mg/dL (ref 0.61–1.24)
GFR, Estimated: >60 mL/min (ref >60)
Glucose, Bld: 141 mg/dL — ABNORMAL HIGH (ref 70–99)
Potassium: 5 mmol/L (ref 3.5–5.1)
Sodium: 130 mmol/L — ABNORMAL LOW (ref 135–145)

## 2022-10-11 LAB — PHOSPHORUS: Phosphorus: 3.2 mg/dL (ref 2.5–4.6)

## 2022-10-11 LAB — MAGNESIUM: Magnesium: 2.2 mg/dL (ref 1.7–2.4)

## 2022-10-11 MED ORDER — FUROSEMIDE 10 MG/ML IJ SOLN
40.0000 mg | Freq: Once | INTRAMUSCULAR | Status: AC
  Administered 2022-10-11: 40 mg via INTRAVENOUS
  Filled 2022-10-11: qty 4

## 2022-10-11 MED ORDER — ALBUMIN HUMAN 25 % IV SOLN
12.5000 g | Freq: Once | INTRAVENOUS | Status: AC
  Administered 2022-10-11: 12.5 g via INTRAVENOUS
  Filled 2022-10-11: qty 50

## 2022-10-11 NOTE — Progress Notes (Signed)
PROGRESS NOTE    Cameron Gutierrez  ZOX:[id number] DOB: 25-Jan-1946 DOA: 09/26/2022 PCP: Dondra Spry, MD    Brief Narrative:  77 yo male who presented to [facility] ER on 07/13 with acute onset of severe abdominal/chest pain.  He was previously admitted at [facility] from 09/21/22-09/25/22 for abdominal pain along with unintentional weight loss.  During hospitalization at [facility] workup concerning for primary metastatic pancreatic adenocarcinoma.  He underwent ERCP/US with biopsy and stent placed on 09/23/22.  His pain was controlled post procedure, and pt discharged home on 09/25/22 (prescribed oxycodone and eliquis resumed).  Upon arrival to [facility] ER significant lab results were: Na+ 133/K+ 3.4/glucose 148/calcium 7.4/albumin 2.2/wbc 15.7.  CT Abd Pelvis concerning for moderate distension of the gallbladder but no findings of acute cholecystitis, however pt continued to c/o persistent abdominal pain.  EDP consulted with [facility] Oncologist Dr. Everardo All, and following review of imaging did not feel pt required transfer to [facility].  Pt subsequently admitted to [facility] medsurg unit per hospitalist team for additional workup and treatment due to continued abdominal pain.    Significant Hospital events :    07/13: Pt admitted to the medsurg unit with diffuse abdominal pain following recent ERCP with stent placement and newly diagnosed primary pancreatic adenocarcinoma with metastasis  [place] Abd Limited RUQ revealed Gallbladder sludge with mild gallbladder wall thickening and small volume pericholecystic fluid. Positive sonographic Murphy sign. Findings are concerning for acute acalculous cholecystitis. Liver metastases seen on CT are not well identified by ultrasound.   07/14: General surgery consulted for acute acalculous cholecystitis and recommended decompression of the gallbladder via percutaneous cholecystostomy per IR and continue abx therapy.    07/15: Pt arrived to special recovery for cholecystostomy drain placement and  pt noted to be in atrial fibrillation with rvr heart rate 140 to 170's.  Cardiology notified and the pt received 5 mg of iv cardizem x1 dose/250 ml NS bolus/20 mg of iv lasix.  Procedure canceled and pt transferred to the stepdown unit for cardizem gtt.  PCCM team consulted to assist with management.     09/29/22 patient in no distress, met with family today.  Awaiting percutaneous cholecystostomy and thoracentesis.  Testing for TLS today.   09/30/22 patient with emphysematous cholecystitis and pneumoperitoneum.  S/p throacentesis with partial clinical improvement.  Plan for IR consultation and cholecystostomy tube, completed by IR.     10/01/22 patient remains weak and chronically ill appearing.  He is s/p GB drain placement by IR and plan today to remove NGT and start PO nourishment.  He is being seen by palliative cancer specialist.   10/02/22 patient had BM overnight no longer with bowel obstruction does have NG still. Remains in AF on heparin, but vitals improved with low dose neo now. Palliative oncology following.  Patient wishes to have cancer therapy if possible. WBC count is normalized today.   10/03/22 : As patient was off vasopressors care was transitioned to hospitalist service.  No overnight events as per nursing.  Blood pressure remained stable off pressors with few soft readings.  Improvement in renal functions.  Nephrology following.  Patient tolerating full liquid diet.  NG can come out tomorrow.   10/04/2022: The patient was seen and examined at his bedside.  His wife was present in the room.  Passed a swallow evaluation.  Okay to do soft diet.  Will repeat LFTs and lipase level.  Will closely monitor to ensure tolerance to soft diet.   10/05/22:  Tolerating a soft  diet well.  NG tube removed.  CT scan ab/p wo contrast ordered due to up-trending leukocytosis.  ID consulted to assist with the management.   10/06/22: The patient was seen and examined at his bedside.  His son was present in  the room.  His leukocytosis continues to uptrend.  Started Merrem and DC'd the Zosyn today.  Appreciate infectious disease assistance.  7/24: No acute events overnight.  Oral intake remains poor.  Discussed with SLP and RD.  Plan to place image guided Dobbhoff and initiate tube feeds due to malnutrition  7/25: Transferred out of ICU.  Dobbhoff in place.  Tube feeds initiated.   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  900 cc removed.   Poor respiratory effort  Weight up since admission  plan: Continue oxygen via nasal cannula wean as tolerated Pulmonary toileting Encourage incentive spirometry and flutter use Bronchodilators Check CXR Lasix 40 mg IV x 1 (received 40 mg dose on 7/27)  Acute Kidney Injury -KDIGO 3 Multifactorial including ischemic/metabolic Kidney function at or near baseline Off IV fluid. Holding home lisinopril Continue Foley Caution while receiving diuretics Daily labs   Acute urinary retention s/p foley cath placement on 09/28/22 Initially difficult to insert follow-up with urology Maintain Foley catheter for now   Permanent atrial fibrillation with rvr  Resolved hypotension secondary to antiarrhythmic medication  Patient transition to p.o. amiodarone and heparin gtt. on 7/24 Plan: Amiodarone 200 mg p.o. twice daily Eliquis 5 mg twice daily   #Acalculous emphysematous cholecystitis: Status post  decompression now with JP drain in place. Elevated white cell count Currently on meropenem White count continues to rise, unclear etiology Likely related underlying malignancy Switch back to Zosyn per ID on 7/26 CT abdomen without new fluid collection.  Drains appear appropriately placed Plan: Continue Zosyn ID follow-up Daily labs IR to consider cholangiogram early next week   #Primary metastatic adenocarcinoma of the pancreas  - Oncology consulted appreciate input  Poor prognosis Patient would like to attempt to optimize nutritional status to see if he would be a candidate for any ongoing therapies.  NG tube now in place.  Tube feeds.  Patient unable to tolerate escalation past 30.  Continue bowel regimen.  Continue simethicone   #Type I diabetes mellitus with hyperglycemia Hemoglobin A1c 7.0 on 09/26/2022. - CBG's ac/hs - SSI    Physical debility Continue PT OT with assistance and fall precautions. Fall precautions.   Moderate protein calorie malnutrition BMI 25 Moderate muscle mass loss Encourage oral intake and increasing protein calorie intake. Dobbhoff tube placed 7/24 RD consulted for tube feeds  DVT prophylaxis: Eliquis Code Status: DNR Family Communication: Spouse at bedside 7/24, 7/25, 7/26, 7/27, 7/28 Disposition Plan: Status is: Inpatient Remains inpatient appropriate because: Multiple acute issues as above   Level of care: Progressive  Consultants:  ID Cardiology  Procedures:  Thoracentesis  Antimicrobials: Zosyn   Subjective: Seen and salmon.  Resting in bed.  Sleeping better.  Continues to  feel fatigued.  Objective: Vitals:   10/10/22 2014 10/10/22 2341 10/11/22 0430 10/11/22 0803  BP: 106/61 (!) 93/59 120/62 94/69  Pulse: 85 81 90 (!) 130  Resp: 18 19 18 19   Temp:  98 F (36.7 C) 98 F (36.7 C) (!) 97.1 F (36.2 C)  TempSrc:  Oral Oral   SpO2: 92% 98% 97% (!) 78%  Weight:      Height:        Intake/Output Summary (Last 24 hours)  at 10/11/2022 1018 Last data filed at 10/11/2022 0834 Gross per 24 hour  Intake 228.13 ml  Output 2130 ml  Net -1901.87 ml   Filed Weights   10/07/22 0500 10/09/22 0333 10/10/22 0318  Weight: 88.8 kg 90.9 kg 89.9 kg    Examination:  General exam: Fatigued Respiratory system: Bibasilar crackles.  Lung sounds diminished on right.  Normal work of breathing.  4 L Cardiovascular system: S1-S2, RRR, no murmur, no pedal edema Gastrointestinal system: Thin, soft, NT/ND, normal bowel sounds Central nervous system: Alert and oriented. No focal neurological deficits. Extremities: Decreased power bilateral lower extremities.  Gait not assessed Skin: No rashes, lesions or ulcers Psychiatry: Judgement and insight appear normal. Mood & affect appropriate.     Data Reviewed: I have personally reviewed following labs and imaging studies  CBC: Recent Labs  Lab 10/07/22 0556 10/08/22 0437 10/09/22 0437 10/10/22 0428 10/11/22 0521  WBC 18.8* 17.6* 19.6* 20.2* 16.7*  NEUTROABS  --  15.9* 17.8* 17.9* 14.6*  HGB 10.1* 10.0* 9.3* 8.7* 8.6*  HCT 28.0* 29.2* 27.6* 25.0* 24.7*  MCV 78.9* 81.6 83.1 81.4 82.9  PLT 232 257 263 266 234   Basic Metabolic Panel: Recent Labs  Lab 10/07/22 0556 10/08/22 0437 10/09/22 0437 10/10/22 0428 10/11/22 0426  NA 126* 129* 128* 127* 130*  K 3.5 4.1 4.2 4.7 5.0  CL 97* 97* 98 98 100  CO2 23 26 24 22 22   GLUCOSE 172* 180* 166* 174* 141*  BUN 35* 29* 30* 34* 38*  CREATININE 1.47* 1.28* 1.10 1.04 1.09  CALCIUM 7.4* 7.5* 7.6* 7.8* 7.7*  MG 2.2 2.3 2.1 2.1 2.2  PHOS 2.7 2.0* 2.6 2.8 3.2   GFR: Estimated Creatinine Clearance: 64.3 mL/min (by C-G formula based on SCr of 1.09 mg/dL). Liver Function Tests: Recent Labs  Lab 10/05/22 0631 10/05/22 1610 10/06/22 0619  AST 284*  --  138*  ALT 198*  --  139*  ALKPHOS 631*  --  512*  BILITOT 1.9*  --  1.7*  PROT 5.2*  --  4.9*  ALBUMIN 2.0* 1.9* 1.7*   No results for input(s): "LIPASE", "AMYLASE" in the  last 168 hours.  No results for input(s): "AMMONIA" in the last 168 hours. Coagulation Profile: No results for input(s): "INR", "PROTIME" in the last 168 hours. Cardiac Enzymes: No results for input(s): "CKTOTAL", "CKMB", "CKMBINDEX", "TROPONINI" in the last 168 hours. BNP (last 3 results) No results for input(s): "PROBNP" in the last 8760 hours. HbA1C: No results for input(s): "HGBA1C" in the last 72 hours. CBG: Recent Labs  Lab 10/10/22 1620 10/10/22 2015 10/10/22 2339 10/11/22 0425 10/11/22 0804  GLUCAP 163* 157* 143* 129* 153*   Lipid Profile: No results for input(s): "CHOL", "HDL", "LDLCALC", "TRIG", "CHOLHDL", "LDLDIRECT" in the last 72 hours. Thyroid Function Tests: No results for input(s): "TSH", "T4TOTAL", "FREET4", "T3FREE", "THYROIDAB" in the last 72 hours. Anemia Panel: No results for input(s): "VITAMINB12", "FOLATE", "FERRITIN", "TIBC", "IRON", "RETICCTPCT" in the last 72 hours. Sepsis Labs: No results  for input(s): "PROCALCITON", "LATICACIDVEN" in the last 168 hours.   Recent Results (from the past 240 hour(s))  Culture, blood (Routine X 2) w Reflex to ID Panel     Status: None   Collection Time: 10/05/22  6:31 AM   Specimen: BLOOD LEFT HAND  Result Value Ref Range Status   Specimen Description BLOOD LEFT HAND  Final   Special Requests   Final    BOTTLES DRAWN AEROBIC AND ANAEROBIC Blood Culture results may not be optimal due to an inadequate volume of blood received in culture bottles   Culture   Final    NO GROWTH 5 DAYS Performed at [facility], [address] [place], [place] [address]    Report Status 10/10/2022 FINAL  Final  Culture, blood (Routine X 2) w Reflex to ID Panel     Status: None   Collection Time: 10/05/22  6:31 AM   Specimen: BLOOD RIGHT HAND  Result Value Ref Range Status   Specimen Description BLOOD RIGHT HAND  Final   Special Requests   Final    BOTTLES DRAWN AEROBIC AND ANAEROBIC Blood Culture adequate volume    Culture   Final    NO GROWTH 5 DAYS Performed at [facility], [address] [place], [place] [address]    Report Status 10/10/2022 FINAL  Final         Radiology Studies: [facility] Chest Port 1 View  Result Date: 10/10/2022 CLINICAL DATA:  [id number] Hypoxia [id number] EXAM: PORTABLE CHEST 1 VIEW COMPARISON:  September 29, 2022, October 10, 2022 FINDINGS: The cardiomediastinal silhouette is unchanged in contour.Weighted tip feeding tube tip terminates over the distal stomach. Layering RIGHT pleural effusion, grossly similar in comparison to prior CT but increased since July 16. RIGHT-sided perihepatic drain. RIGHT-sided percutaneous cholecystostomy. No pneumothorax. Hazy opacities throughout the RIGHT lung, likely atelectasis. LEFT basilar platelike opacity, likely atelectasis. IMPRESSION: Layering RIGHT pleural effusion, grossly similar in comparison to prior CT but increased since July 16. Electronically Signed   By: Meda Klinefelter M.D.   On: 10/10/2022 15:56   CT ABDOMEN PELVIS WO CONTRAST  Result Date: 10/10/2022 CLINICAL DATA:  Right upper quadrant abdominal pain. History of pancreatic mass. EXAM: CT ABDOMEN AND PELVIS WITHOUT CONTRAST TECHNIQUE: Multidetector CT imaging of the abdomen and pelvis was performed following the standard protocol without IV contrast. RADIATION DOSE REDUCTION: This exam was performed according to the departmental dose-optimization program which includes automated exposure control, adjustment of the mA and/or kV according to patient size and/or use of iterative reconstruction technique. COMPARISON:  October 05, 2022. FINDINGS: Lower chest: Bilateral pleural effusions are noted with adjacent atelectasis of both lower lobes, right greater than left. Hepatobiliary: Stable position of percutaneous drainage catheter seen into right subdiaphragmatic space. Stable position of cholecystostomy tube seen within gallbladder lumen. There remains gas within the wall the gallbladder  suggesting emphysematous cholecystitis. Common bile duct stent is again noted. Multiple hepatic masses are again noted consistent with metastatic disease. Left hepatic pneumobilia is noted. Pancreas: There remains large mass involving pancreatic head and body consistent with malignancy which is not significantly changed compared with prior exam performed 5 days ago. Spleen: Multiple calcified splenic granulomas are noted. Adrenals/Urinary Tract: Adrenal glands and kidneys appear normal. No hydronephrosis or renal obstruction is noted. Urinary bladder is decompressed secondary to Foley catheter. Stomach/Bowel: Feeding tube is seen in proximal duodenum. Stomach is otherwise unremarkable. There is no evidence of bowel obstruction or inflammation. The appendix appears normal. Sigmoid diverticulosis without  inflammation. Vascular/Lymphatic: Aortic atherosclerosis. Retroperitoneal adenopathy is again noted and stable, including 2 cm retrocaval lymph node. Reproductive: Prostate is unremarkable. Other: Moderate anasarca is noted. Mild ascites is noted, including fluid around the liver and spleen. Mild to moderate size bilateral inguinal hernias are noted which contain fat and fluid. Musculoskeletal: No acute or significant osseous findings. IMPRESSION: Bilateral pleural effusions are noted with associated atelectasis of both lower lobes, right greater than left. Stable position of percutaneous cholecystostomy tube. Stable findings consistent with emphysematous cholecystitis. Stable position of percutaneous drainage catheter into right subdiaphragmatic space. Stable large mass involving pancreatic head and body with stent noted in common bile duct. Multiple hepatic metastatic lesions are again noted and stable. Moderate anasarca is noted.  Mild ascites is noted. Stable retroperitoneal adenopathy consistent metastatic disease. Electronically Signed   By: Lupita Raider M.D.   On: 10/10/2022 13:49        Scheduled  Meds:  acetaminophen  500 mg Oral QHS   amiodarone  200 mg Oral BID   apixaban  5 mg Oral BID   atorvastatin  40 mg Oral q morning   Chlorhexidine Gluconate Cloth  6 each Topical QHS   feeding supplement  237 mL Oral TID BM   feeding supplement (PROSource TF20)  60 mL Per Tube Daily   free water  30 mL Per Tube Q4H   insulin aspart  0-9 Units Subcutaneous Q4H   insulin glargine-yfgn  8 Units Subcutaneous Daily   midodrine  10 mg Oral TID WC   mirtazapine  15 mg Oral QHS   multivitamin with minerals  1 tablet Oral Daily   mouth rinse  15 mL Mouth Rinse 4 times per day   senna-docusate  1 tablet Oral BID   simethicone  80 mg Oral QID   sodium chloride flush  5 mL Intracatheter Q8H   Continuous Infusions:  sodium chloride 250 mL (10/07/22 2153)   feeding supplement (VITAL 1.5 CAL) 30 mL/hr at 10/10/22 0600   meropenem (MERREM) IV 1 g (10/11/22 0526)   promethazine (PHENERGAN) injection (IM or IVPB)       LOS: 14 days       Tresa Moore, MD [facility]   If 7PM-7AM, please contact night-coverage  10/11/2022, 10:18 AM

## 2022-10-11 NOTE — Plan of Care (Signed)
  Problem: Coping: Goal: Ability to adjust to condition or change in health will improve Outcome: Progressing   Problem: Fluid Volume: Goal: Ability to maintain a balanced intake and output will improve Outcome: Progressing   Problem: Health Behavior/Discharge Planning: Goal: Ability to manage health-related needs will improve Outcome: Progressing   

## 2022-10-11 NOTE — Progress Notes (Signed)
Physical Therapy Treatment Patient Details Name: Cameron Gutierrez MRN: [id number] DOB: Sep 22, 1945 Today's Date: 10/11/2022   History of Present Illness 77 yo male who presented to [facility] ER on 07/13 with acute onset of severe abdominal/chest pain.  He was previously admitted at [facility] from 09/21/22-09/25/22 for abdominal pain along with unintentional weight loss.  During hospitalization at [facility] workup concerning for primary metastatic pancreatic adenocarcinoma.  He underwent ERCP/US with biopsy and stent placed on 09/23/22.  His pain was controlled post procedure, and pt discharged home on 09/25/22    PT Comments  Pt received in bed with NG tube, Drainage bulbs, Telemetry box  and Foley in place agreeable to participate with PT. Pt performed supine to sit, sit to stand and step pivot transfer with FWW with Min guard of 1 and second person for SBA. Pt performed AROM to BLE and advised to the same with written instructions on white board every 3 hours. Pt and spouse demonstrated good understanding. Pt will benefit from continued PT in acute and beyond. Current POC remains appropriate.     Assistance Recommended at Discharge Intermittent Supervision/Assistance  If plan is discharge home, recommend the following:  Can travel by private vehicle    A little help with walking and/or transfers;A lot of help with bathing/dressing/bathroom;Assistance with cooking/housework;Assistance with feeding;Direct supervision/assist for medications management;Assist for transportation;Help with stairs or ramp for entrance   No  Equipment Recommendations  Other (comment) (TBD)    Recommendations for Other Services       Precautions / Restrictions Precautions Precautions: Fall Restrictions Weight Bearing Restrictions: No     Mobility  Bed Mobility Overal bed mobility: Needs Assistance Bed Mobility: Supine to Sit     Supine to sit: Min guard          Transfers Overall transfer level: Needs  assistance Equipment used: Rolling walker (2 wheels) Transfers: Sit to/from Stand, Bed to chair/wheelchair/BSC Sit to Stand: Min assist, Min guard   Step pivot transfers: Min guard, Min assist       General transfer comment: slow but steady    Ambulation/Gait               General Gait Details: deferred 2/2 pt fatigued   Stairs             Wheelchair Mobility     Tilt Bed    Modified Rankin (Stroke Patients Only)       Balance Overall balance assessment: Needs assistance Sitting-balance support: Feet supported Sitting balance-Leahy Scale: Good     Standing balance support: Bilateral upper extremity supported Standing balance-Leahy Scale: Fair Standing balance comment: Pt continue to demo increased reliance on RW. forard flexed posture.                            Cognition Arousal/Alertness: Awake/alert Behavior During Therapy: WFL for tasks assessed/performed, Flat affect Overall Cognitive Status: Within Functional Limits for tasks assessed                                 General Comments: Pt is pleasant throughout session and follows commands with increased time to process        Exercises General Exercises - Lower Extremity Ankle Circles/Pumps: AROM, Both, 10 reps Long Arc Quad: AROM, Both, 10 reps Hip Flexion/Marching: AROM, Both, 10 reps Other Exercises Other Exercises: Pt provided with written instructions to do  the same exs every 3 hours 5 to 10 reps.    General Comments        Pertinent Vitals/Pain Pain Assessment Pain Assessment: No/denies pain    Home Living                          Prior Function            PT Goals (current goals can now be found in the care plan section) Acute Rehab PT Goals PT Goal Formulation: With patient Time For Goal Achievement: 10/17/22 Potential to Achieve Goals: Fair Progress towards PT goals: Progressing toward goals    Frequency    Min  1X/week      PT Plan Current plan remains appropriate    Co-evaluation              AM-PAC PT "6 Clicks" Mobility   Outcome Measure  Help needed turning from your back to your side while in a flat bed without using bedrails?: A Little Help needed moving from lying on your back to sitting on the side of a flat bed without using bedrails?: A Little Help needed moving to and from a bed to a chair (including a wheelchair)?: A Little Help needed standing up from a chair using your arms (e.g., wheelchair or bedside chair)?: A Little Help needed to walk in hospital room?: A Lot Help needed climbing 3-5 steps with a railing? : Total 6 Click Score: 15    End of Session Equipment Utilized During Treatment: Oxygen Activity Tolerance: Patient limited by fatigue;Treatment limited secondary to medical complications (Comment) Patient left: in chair;with call bell/phone within reach;with chair alarm set;with family/visitor present Nurse Communication: Mobility status PT Visit Diagnosis: Muscle weakness (generalized) (M62.81);Difficulty in walking, not elsewhere classified (R26.2)     Time: 3664-4034 PT Time Calculation (min) (ACUTE ONLY): 22 min  Charges:    $Therapeutic Activity: 8-22 mins PT General Charges $$ ACUTE PT VISIT: 1 Visit           Janet Berlin PT DPT 2:16 PM,10/11/22

## 2022-10-12 ENCOUNTER — Inpatient Hospital Stay: Payer: Medicare Other

## 2022-10-12 ENCOUNTER — Inpatient Hospital Stay: Payer: Medicare Other | Admitting: Radiology

## 2022-10-12 DIAGNOSIS — Z515 Encounter for palliative care: Secondary | ICD-10-CM | POA: Diagnosis not present

## 2022-10-12 DIAGNOSIS — C259 Malignant neoplasm of pancreas, unspecified: Secondary | ICD-10-CM | POA: Diagnosis not present

## 2022-10-12 HISTORY — PX: IR THORACENTESIS RIGHT ASP PLEURAL SPACE W/IMG GUIDE: IMG5380

## 2022-10-12 HISTORY — PX: IR EXCHANGE BILIARY DRAIN: IMG6046

## 2022-10-12 LAB — HEPATIC FUNCTION PANEL
ALT: 110 U/L — ABNORMAL HIGH (ref 0–44)
AST: 153 U/L — ABNORMAL HIGH (ref 15–41)
Albumin: 1.8 g/dL — ABNORMAL LOW (ref 3.5–5.0)
Alkaline Phosphatase: 265 U/L — ABNORMAL HIGH (ref 38–126)
Bilirubin, Direct: 0.4 mg/dL — ABNORMAL HIGH (ref 0.0–0.2)
Indirect Bilirubin: 0.8 mg/dL (ref 0.3–0.9)
Total Bilirubin: 1.2 mg/dL (ref 0.3–1.2)
Total Protein: 5.6 g/dL — ABNORMAL LOW (ref 6.5–8.1)

## 2022-10-12 LAB — GLUCOSE, CAPILLARY
Glucose-Capillary: 108 mg/dL — ABNORMAL HIGH (ref 70–99)
Glucose-Capillary: 111 mg/dL — ABNORMAL HIGH (ref 70–99)
Glucose-Capillary: 116 mg/dL — ABNORMAL HIGH (ref 70–99)
Glucose-Capillary: 131 mg/dL — ABNORMAL HIGH (ref 70–99)
Glucose-Capillary: 137 mg/dL — ABNORMAL HIGH (ref 70–99)
Glucose-Capillary: 151 mg/dL — ABNORMAL HIGH (ref 70–99)

## 2022-10-12 LAB — BODY FLUID CELL COUNT WITH DIFFERENTIAL
Eos, Fluid: 0 %
Lymphs, Fluid: 65 %
Monocyte-Macrophage-Serous Fluid: 4 %
Neutrophil Count, Fluid: 31 %
Total Nucleated Cell Count, Fluid: 182 cu mm

## 2022-10-12 LAB — PATHOLOGIST SMEAR REVIEW

## 2022-10-12 MED ORDER — LIDOCAINE HCL 1 % IJ SOLN
7.0000 mL | Freq: Once | INTRAMUSCULAR | Status: AC
  Administered 2022-10-12: 7 mL via INTRADERMAL
  Filled 2022-10-12: qty 7

## 2022-10-12 MED ORDER — LIDOCAINE HCL 1 % IJ SOLN
8.0000 mL | Freq: Once | INTRAMUSCULAR | Status: AC
  Administered 2022-10-12: 8 mL via INTRADERMAL
  Filled 2022-10-12: qty 8

## 2022-10-12 MED ORDER — MIDAZOLAM HCL 2 MG/2ML IJ SOLN
INTRAMUSCULAR | Status: AC
  Filled 2022-10-12: qty 2

## 2022-10-12 MED ORDER — IOHEXOL 300 MG/ML  SOLN
8.0000 mL | Freq: Once | INTRAMUSCULAR | Status: AC | PRN
  Administered 2022-10-12: 8 mL

## 2022-10-12 MED ORDER — LIDOCAINE HCL 1 % IJ SOLN
INTRAMUSCULAR | Status: AC
  Filled 2022-10-12: qty 20

## 2022-10-12 MED ORDER — FENTANYL CITRATE (PF) 100 MCG/2ML IJ SOLN
INTRAMUSCULAR | Status: AC
  Filled 2022-10-12: qty 2

## 2022-10-12 MED ORDER — FENTANYL CITRATE (PF) 100 MCG/2ML IJ SOLN
INTRAMUSCULAR | Status: AC | PRN
  Administered 2022-10-12 (×2): 25 ug via INTRAVENOUS

## 2022-10-12 NOTE — Progress Notes (Signed)
Physical Therapy Treatment Patient Details Name: Cameron Gutierrez MRN: [id number] DOB: 1945-07-16 Today's Date: 10/12/2022   History of Present Illness 77 yo male who presented to [facility] ER on 07/13 with acute onset of severe abdominal/chest pain.  He was previously admitted at [facility] from 09/21/22-09/25/22 for abdominal pain along with unintentional weight loss.  During hospitalization at [facility] workup concerning for primary metastatic pancreatic adenocarcinoma.  He underwent ERCP/US with biopsy and stent placed on 09/23/22.  His pain was controlled post procedure, and pt discharged home on 09/25/22    PT Comments  Patient wakes to voice, agreeable to PT, denied pain. He was able to demonstrate several supine exercises that he has been doing for his HEP. minAx2 for safety for bed mobility lines/leads management. Able to sit EOB for several minutes for BP assessment, WFLs (pt reported dizziness initially that resolved with time). Stand pivot with RW and minAx2 to recliner but due to arrival of transport pt assisted back to bed. Left with RN/transport at bedside. The patient would benefit from further skilled PT intervention to continue to progress towards goals.      If plan is discharge home, recommend the following: A little help with walking and/or transfers;A lot of help with bathing/dressing/bathroom;Assistance with cooking/housework;Assistance with feeding;Direct supervision/assist for medications management;Assist for transportation;Help with stairs or ramp for entrance   Can travel by private vehicle     No  Equipment Recommendations  Other (comment) (TBD)    Recommendations for Other Services       Precautions / Restrictions Precautions Precautions: Fall Restrictions Weight Bearing Restrictions: No     Mobility  Bed Mobility Overal bed mobility: Needs Assistance Bed Mobility: Supine to Sit, Sit to Supine     Supine to sit: Min assist Sit to supine: Min assist   General  bed mobility comments: assistance for B LEs    Transfers Overall transfer level: Needs assistance Equipment used: Rolling walker (2 wheels), None Transfers: Bed to chair/wheelchair/BSC     Step pivot transfers: Min assist, +2 safety/equipment            Ambulation/Gait               General Gait Details: deferred 2/2 pt fatigued   Stairs             Wheelchair Mobility     Tilt Bed    Modified Rankin (Stroke Patients Only)       Balance Overall balance assessment: Needs assistance Sitting-balance support: Feet supported Sitting balance-Leahy Scale: Good     Standing balance support: Bilateral upper extremity supported Standing balance-Leahy Scale: Poor                              Cognition Arousal/Alertness: Awake/alert Behavior During Therapy: WFL for tasks assessed/performed, Flat affect Overall Cognitive Status: Within Functional Limits for tasks assessed                                 General Comments: Pt is pleasant throughout session and follows commands with increased time to process        Exercises      General Comments        Pertinent Vitals/Pain Pain Assessment Pain Assessment: No/denies pain    Home Living  Prior Function            PT Goals (current goals can now be found in the care plan section) Progress towards PT goals: Progressing toward goals    Frequency    Min 1X/week      PT Plan Current plan remains appropriate    Co-evaluation              AM-PAC PT "6 Clicks" Mobility   Outcome Measure  Help needed turning from your back to your side while in a flat bed without using bedrails?: A Little Help needed moving from lying on your back to sitting on the side of a flat bed without using bedrails?: A Little Help needed moving to and from a bed to a chair (including a wheelchair)?: A Little Help needed standing up from a chair  using your arms (e.g., wheelchair or bedside chair)?: A Little Help needed to walk in hospital room?: A Lot Help needed climbing 3-5 steps with a railing? : Total 6 Click Score: 15    End of Session Equipment Utilized During Treatment: Oxygen Activity Tolerance: Patient limited by fatigue Patient left: in bed;with nursing/sitter in room Nurse Communication: Mobility status PT Visit Diagnosis: Muscle weakness (generalized) (M62.81);Difficulty in walking, not elsewhere classified (R26.2)     Time: 1610-9604 PT Time Calculation (min) (ACUTE ONLY): 28 min  Charges:    $Therapeutic Activity: 23-37 mins PT General Charges $$ ACUTE PT VISIT: 1 Visit                     Olga Coaster PT, DPT 3:25 PM,10/12/22

## 2022-10-12 NOTE — Progress Notes (Signed)
Palliative Medicine [facility] at [facility] [phone]) [phone] [phone]) [phone]   Name: Cameron Gutierrez Date: 10/12/2022 MRN: [id number]  DOB: 1946-03-16  Patient Care Team: Dondra Spry, MD as PCP - General (Internal Medicine) Clinic, General Medical (Family Medicine)    REASON FOR CONSULTATION: Cameron Gutierrez is a 77 y.o. male with multiple medical problems including diabetes, hypertension, CKD stage II, A-fib on Eliquis, who was recently hospitalized at [facility] 09/21/2022 to 09/25/2022 for abdominal pain and weight loss with workup consistent with metastatic adenocarcinoma of the pancreas. Patient readmitted here on 09/26/2022 with intractable abdominal pain. Was found to have acalculus cholecystitis. Hospitalization has been complicated by A-fib. Palliative care was consulted to address goals and manage ongoing symptoms.    CODE STATUS: DNR  PAST MEDICAL HISTORY: Past Medical History:  Diagnosis Date   Atrial fibrillation (HCC)    Cancer (HCC)    pancreatic   Diabetes mellitus without complication (HCC)    Hypercholesteremia    Hypertension     PAST SURGICAL HISTORY:  Past Surgical History:  Procedure Laterality Date   KNEE SURGERY     REPLACEMENT TOTAL KNEE Right    SHOULDER SURGERY      HEMATOLOGY/ONCOLOGY HISTORY:  Oncology History   No history exists.    ALLERGIES:  has No Known Allergies.  MEDICATIONS:  Current Facility-Administered Medications  Medication Dose Route Frequency Provider Last Rate Last Admin   0.9 %  sodium chloride infusion  250 mL Intravenous Continuous Ezequiel Essex, NP 10 mL/hr at 10/07/22 2153 250 mL at 10/07/22 2153   acetaminophen (TYLENOL) tablet 500 mg  500 mg Oral QHS Lolita Patella B, MD   500 mg at 10/11/22 2132   alum & mag hydroxide-simeth (MAALOX/MYLANTA) 200-200-20 MG/5ML suspension 30 mL  30 mL Oral Q4H PRN Andris Baumann, MD   30 mL at 10/07/22 0024   amiodarone (PACERONE) tablet 200 mg   200 mg Oral BID Eula Listen M, PA-C   200 mg at 10/12/22 6213   apixaban (ELIQUIS) tablet 5 mg  5 mg Oral BID Eula Listen M, PA-C   5 mg at 10/12/22 0865   atorvastatin (LIPITOR) tablet 40 mg  40 mg Oral q morning Enedina Finner, MD   40 mg at 10/12/22 7846   bisacodyl (DULCOLAX) suppository 10 mg  10 mg Rectal Daily PRN Lolita Patella B, MD   10 mg at 10/09/22 1448   Chlorhexidine Gluconate Cloth 2 % PADS 6 each  6 each Topical QHS Enedina Finner, MD   6 each at 10/11/22 2132   feeding supplement (ENSURE ENLIVE / ENSURE PLUS) liquid 237 mL  237 mL Oral TID BM Dow Adolph N, DO   237 mL at 10/11/22 0923   feeding supplement (PROSource TF20) liquid 60 mL  60 mL Per Tube Daily Lolita Patella B, MD   60 mL at 10/11/22 0923   feeding supplement (VITAL 1.5 CAL) liquid 1,000 mL  1,000 mL Per Tube Continuous Lolita Patella B, MD 30 mL/hr at 10/10/22 0600 Infusion Verify at 10/10/22 0600   fentaNYL (SUBLIMAZE) injection 50 mcg  50 mcg Intravenous Q4H PRN Enedina Finner, MD   50 mcg at 10/12/22 0542   free water 30 mL  30 mL Per Tube Q4H Sreenath, Sudheer B, MD   30 mL at 10/12/22 1030   insulin aspart (novoLOG) injection 0-9 Units  0-9 Units Subcutaneous Q4H Rust-Chester, Britton L, NP   1 Units at 10/12/22 0900  insulin glargine-yfgn (SEMGLEE) injection 8 Units  8 Units Subcutaneous Daily Lolita Patella B, MD   8 Units at 10/12/22 0907   levalbuterol (XOPENEX) nebulizer solution 0.63 mg  0.63 mg Nebulization Q6H PRN Ezequiel Essex, NP   0.63 mg at 10/06/22 0812   meropenem (MERREM) 1 g in sodium chloride 0.9 % 100 mL IVPB  1 g Intravenous Q8H Sreenath, Sudheer B, MD 200 mL/hr at 10/12/22 0544 1 g at 10/12/22 0544   metoprolol tartrate (LOPRESSOR) injection 5 mg  5 mg Intravenous Q4H PRN Enedina Finner, MD   5 mg at 09/28/22 1456   midodrine (PROAMATINE) tablet 10 mg  10 mg Oral TID WC Harlon Ditty D, NP   10 mg at 10/12/22 1300   mirtazapine (REMERON) tablet 15 mg  15 mg Oral QHS Lolita Patella B, MD    15 mg at 10/11/22 2132   multivitamin with minerals tablet 1 tablet  1 tablet Oral Daily Enedina Finner, MD   1 tablet at 10/12/22 0905   ondansetron (ZOFRAN) tablet 4 mg  4 mg Oral Q6H PRN Enedina Finner, MD       Or   ondansetron [facility]) injection 4 mg  4 mg Intravenous Q6H PRN Enedina Finner, MD   4 mg at 09/28/22 1638   Oral care mouth rinse  15 mL Mouth Rinse 4 times per day Darlin Drop, DO   15 mL at 10/12/22 0800   Oral care mouth rinse  15 mL Mouth Rinse PRN Dow Adolph N, DO       oxyCODONE (Oxy IR/ROXICODONE) immediate release tablet 10 mg  10 mg Oral Q4H PRN Enedina Finner, MD   10 mg at 10/07/22 2141   polyethylene glycol (MIRALAX / GLYCOLAX) packet 17 g  17 g Oral Daily PRN Lolita Patella B, MD       promethazine (PHENERGAN) 12.5 mg in sodium chloride 0.9 % 50 mL IVPB  12.5 mg Intravenous Q6H PRN Enedina Finner, MD       senna-docusate (Senokot-S) tablet 1 tablet  1 tablet Oral BID Lolita Patella B, MD   1 tablet at 10/12/22 0905   simethicone (MYLICON) chewable tablet 80 mg  80 mg Oral QID Lolita Patella B, MD   80 mg at 10/12/22 0905   sodium chloride flush (NS) 0.9 % injection 5 mL  5 mL Intracatheter Q8H Suttle, Dylan J, MD   5 mL at 10/12/22 0542   traMADol (ULTRAM) tablet 50 mg  50 mg Oral Q6H PRN Enedina Finner, MD   50 mg at 09/27/22 2252    VITAL SIGNS: BP 99/64 (BP Location: Right Arm)   Pulse 96   Temp 98.4 F (36.9 C)   Resp 18   Ht 6' (1.829 m)   Wt 198 lb 6.6 oz (90 kg)   SpO2 94%   BMI 26.91 kg/m  Filed Weights   10/09/22 0333 10/10/22 0318 10/12/22 0300  Weight: 200 lb 6.4 oz (90.9 kg) 198 lb 3.1 oz (89.9 kg) 198 lb 6.6 oz (90 kg)    Estimated body mass index is 26.91 kg/m as calculated from the following:   Height as of this encounter: 6' (1.829 m).   Weight as of this encounter: 198 lb 6.6 oz (90 kg).  LABS: CBC:    Component Value Date/Time   WBC 14.5 (H) 10/12/2022 0535   HGB 7.5 (L) 10/12/2022 0535   HGB 13.3 09/29/2022 1139   HCT 22.3 (L)  10/12/2022 0535   HCT  43.7 09/24/2013 0122   PLT 262 10/12/2022 0535   PLT 170 09/24/2013 0122   MCV 84.8 10/12/2022 0535   MCV 87 09/24/2013 0122   NEUTROABS 12.9 (H) 10/12/2022 0535   NEUTROABS 5.5 09/24/2013 0122   LYMPHSABS 0.7 10/12/2022 0535   LYMPHSABS 1.4 09/24/2013 0122   MONOABS 0.8 10/12/2022 0535   MONOABS 0.7 09/24/2013 0122   EOSABS 0.0 10/12/2022 0535   EOSABS 0.3 09/24/2013 0122   BASOSABS 0.0 10/12/2022 0535   BASOSABS 0.0 09/24/2013 0122   Comprehensive Metabolic Panel:    Component Value Date/Time   NA 130 (L) 10/12/2022 0535   NA 141 04/09/2021 1427   NA 141 09/24/2013 0122   K 4.6 10/12/2022 0535   K 3.8 09/24/2013 0122   CL 99 10/12/2022 0535   CL 105 09/24/2013 0122   CO2 24 10/12/2022 0535   CO2 28 09/24/2013 0122   BUN 40 (H) 10/12/2022 0535   BUN 24 04/09/2021 1427   BUN 29 (H) 09/24/2013 0122   CREATININE 1.14 10/12/2022 0535   CREATININE 1.50 (H) 09/24/2013 0122   GLUCOSE 159 (H) 10/12/2022 0535   GLUCOSE 127 (H) 09/24/2013 0122   CALCIUM 7.8 (L) 10/12/2022 0535   CALCIUM 8.8 09/24/2013 0122   AST 153 (H) 10/12/2022 0535   ALT 110 (H) 10/12/2022 0535   ALKPHOS 265 (H) 10/12/2022 0535   BILITOT 1.2 10/12/2022 0535   PROT 5.6 (L) 10/12/2022 0535   ALBUMIN 1.8 (L) 10/12/2022 0535    RADIOGRAPHIC STUDIES: DG Chest Port 1 View  Result Date: 10/10/2022 CLINICAL DATA:  [id number] Hypoxia [id number] EXAM: PORTABLE CHEST 1 VIEW COMPARISON:  September 29, 2022, October 10, 2022 FINDINGS: The cardiomediastinal silhouette is unchanged in contour.Weighted tip feeding tube tip terminates over the distal stomach. Layering RIGHT pleural effusion, grossly similar in comparison to prior CT but increased since July 16. RIGHT-sided perihepatic drain. RIGHT-sided percutaneous cholecystostomy. No pneumothorax. Hazy opacities throughout the RIGHT lung, likely atelectasis. LEFT basilar platelike opacity, likely atelectasis. IMPRESSION: Layering RIGHT pleural effusion, grossly  similar in comparison to prior CT but increased since July 16. Electronically Signed   By: Meda Klinefelter M.D.   On: 10/10/2022 15:56   CT ABDOMEN PELVIS WO CONTRAST  Result Date: 10/10/2022 CLINICAL DATA:  Right upper quadrant abdominal pain. History of pancreatic mass. EXAM: CT ABDOMEN AND PELVIS WITHOUT CONTRAST TECHNIQUE: Multidetector CT imaging of the abdomen and pelvis was performed following the standard protocol without IV contrast. RADIATION DOSE REDUCTION: This exam was performed according to the departmental dose-optimization program which includes automated exposure control, adjustment of the mA and/or kV according to patient size and/or use of iterative reconstruction technique. COMPARISON:  October 05, 2022. FINDINGS: Lower chest: Bilateral pleural effusions are noted with adjacent atelectasis of both lower lobes, right greater than left. Hepatobiliary: Stable position of percutaneous drainage catheter seen into right subdiaphragmatic space. Stable position of cholecystostomy tube seen within gallbladder lumen. There remains gas within the wall the gallbladder suggesting emphysematous cholecystitis. Common bile duct stent is again noted. Multiple hepatic masses are again noted consistent with metastatic disease. Left hepatic pneumobilia is noted. Pancreas: There remains large mass involving pancreatic head and body consistent with malignancy which is not significantly changed compared with prior exam performed 5 days ago. Spleen: Multiple calcified splenic granulomas are noted. Adrenals/Urinary Tract: Adrenal glands and kidneys appear normal. No hydronephrosis or renal obstruction is noted. Urinary bladder is decompressed secondary to Foley catheter. Stomach/Bowel: Feeding tube is seen in proximal duodenum. Stomach  is otherwise unremarkable. There is no evidence of bowel obstruction or inflammation. The appendix appears normal. Sigmoid diverticulosis without inflammation. Vascular/Lymphatic:  Aortic atherosclerosis. Retroperitoneal adenopathy is again noted and stable, including 2 cm retrocaval lymph node. Reproductive: Prostate is unremarkable. Other: Moderate anasarca is noted. Mild ascites is noted, including fluid around the liver and spleen. Mild to moderate size bilateral inguinal hernias are noted which contain fat and fluid. Musculoskeletal: No acute or significant osseous findings. IMPRESSION: Bilateral pleural effusions are noted with associated atelectasis of both lower lobes, right greater than left. Stable position of percutaneous cholecystostomy tube. Stable findings consistent with emphysematous cholecystitis. Stable position of percutaneous drainage catheter into right subdiaphragmatic space. Stable large mass involving pancreatic head and body with stent noted in common bile duct. Multiple hepatic metastatic lesions are again noted and stable. Moderate anasarca is noted.  Mild ascites is noted. Stable retroperitoneal adenopathy consistent metastatic disease. Electronically Signed   By: Lupita Raider M.D.   On: 10/10/2022 13:49   DG Abd 1 View  Result Date: 10/09/2022 CLINICAL DATA:  Abdominal pain.  History of pancreatic cancer. EXAM: ABDOMEN - 1 VIEW COMPARISON:  CT 10/05/2022 FINDINGS: Tip of the weighted enteric tube in the right upper quadrant in the region of the distal stomach. There is gaseous gastric distension. Tube biliary drains and a biliary stent are in place. Small to moderate volume of stool in the colon. Increased air throughout mildly dilated small bowel in the central abdomen. Pelvic calcifications consistent with phleboliths. Vascular calcifications are seen. IMPRESSION: 1. Tip of the weighted enteric tube in the right upper quadrant in the region of the distal stomach. Gaseous gastric distension. 2. Increased air throughout mildly dilated small bowel in the central abdomen, may be ileus or enteritis. Electronically Signed   By: Narda Rutherford M.D.   On:  10/09/2022 13:09   DG Basil Dess Tube Plc W/Fl W/Rad  Result Date: 10/07/2022 CLINICAL DATA:  Provided history: Dysphagia. Request received for NG tube placement under fluoroscopy. EXAM: NASO G TUBE PLACEMENT WITH FL AND WITH RAD CONTRAST:  None FLUOROSCOPY: Radiation Exposure Index (if provided by the fluoroscopic device): 6.30 mGy air kerma COMPARISON:  CT abdomen/pelvis 10/05/2022. FINDINGS: The tip of the enteric tube was lubricated and gently inserted into the right nostril. Under fluoroscopic guidance, the tube easily advanced into the esophagus and subsequently into the stomach. The tip of the nasogastric tube reach the gastric antrum. The procedure was performed by Alwyn Ren NP, supervised by Dr. Jackey Loge. IMPRESSION: Technically successful fluoroscopically-guided placement of a 10 French nasogastric feeding tube. The tip of the tube is located within the gastric antrum. Electronically Signed   By: Jackey Loge D.O.   On: 10/07/2022 16:05   CT ABDOMEN PELVIS WO CONTRAST  Result Date: 10/05/2022 CLINICAL DATA:  Ascites. Recent diagnosis of pancreatic cancer and biliary obstruction status post common bile duct stent placement. A facet [place]/structured acute cholecystitis. EXAM: CT ABDOMEN AND PELVIS WITHOUT CONTRAST TECHNIQUE: Multidetector CT imaging of the abdomen and pelvis was performed following the standard protocol without IV contrast. RADIATION DOSE REDUCTION: This exam was performed according to the departmental dose-optimization program which includes automated exposure control, adjustment of the mA and/or kV according to patient size and/or use of iterative reconstruction technique. COMPARISON:  CT abdomen pelvis dated 09/29/2022. FINDINGS: Evaluation of this exam is limited in the absence of intravenous contrast. Lower chest: Partially visualized small bilateral pleural effusions and consolidative changes of the majority of the visualized lower  lobes with air bronchograms which may  represent atelectasis or pneumonia. A 5 mm nodule in the lingula as well as a 6 mm left lower lobe nodule. Small pockets of pneumoperitoneum in the upper abdomen likely introduced via drainage catheter placement. There is a small ascites. Hepatobiliary: Hypodense mass involving the caudate lobe of the liver measure up to 7.4 cm in keeping with metastatic disease. Additional ill-defined hypodense lesions within the liver also consistent with metastatic disease. There is mild dilatation and pneumobilia. The gallbladder is poorly visualized. There is air in the gallbladder in keeping with emphysematous cholecystitis. A percutaneous cholecystostomy with pigtail tip in the body of the gallbladder noted. There is debris within the gallbladder. Pancreas: Ill-defined mass at the head of the pancreas measuring 6.5 x 7.3 cm in keeping with known malignancy. This mass appears to encase the common bile duct. A common bowel duct stent is noted. There is air in the proximal lumen of the stent. There is debris or infiltrative mass in the distal lumen of the stent. Spleen: Normal in size without focal abnormality. Adrenals/Urinary Tract: The adrenal glands are unremarkable. Mild bilateral hydronephrosis versus parapelvic cysts. No renal calculi noted. The visualized ureters appear unremarkable. The urinary bladder is decompressed around a Foley catheter. Stomach/Bowel: There is sigmoid diverticulosis without active inflammatory changes. There is no bowel obstruction. The appendix is suboptimally visualized but grossly unremarkable. Vascular/Lymphatic: Mild aortoiliac atherosclerotic disease. The IVC is unremarkable. No portal venous gas. Retroperitoneal adenopathy including a 2 cm retrocaval lymph node. Reproductive: The prostate and seminal vesicles are grossly unremarkable. Other: Small fat containing bilateral inguinal hernia. There is diffuse subcutaneous edema. Musculoskeletal: No acute osseous pathology. IMPRESSION: 1.  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 2. Pancreatic head mass in keeping with known malignancy. 3. Hepatic metastatic disease. 4. Retroperitoneal adenopathy. 5. Small ascites. 6. Small bilateral pleural effusions and bibasilar atelectasis or infiltrate. 7. Aortic Atherosclerosis (ICD10-I70.0) and Emphysema (ICD10-J43.9). Electronically Signed   By: Elgie Collard M.D.   On: 10/05/2022 19:23   DG Abd 1 View  Result Date: 09/30/2022 CLINICAL DATA:  NG tube placement EXAM: ABDOMEN - 1 VIEW COMPARISON:  CT abdomen/pelvis 1 day prior FINDINGS: The enteric catheter tip and sidehole are in the stomach. The stomach remains significantly distended. Air in the gallbladder lumen and wall is noted consistent with the findings of emphysematous cholecystitis seen on CT from 1 day prior. The free intraperitoneal air seen on that study is not definitely seen on this study. There is gaseous distention of the bowel in the imaged abdomen. A biliary stent is noted. IMPRESSION: 1. Enteric catheter tip and sidehole in the stomach which remains distended. 2. Air in the gallbladder lumen and wall consistent with the findings of emphysematous cholecystitis on the CT study from 1 day prior. The pneumoperitoneum seen on that study is not appreciated on the current study. Electronically Signed   By: Lesia Hausen M.D.   On: 09/30/2022 13:28   CT PERC CHOLECYSTOSTOMY  Result Date: 09/30/2022 INDICATION: 77 year old male with history recently diagnosed advanced stage pancreatic cancer with biliary obstruction status post covered common bile duct stent placement at outside facility approximally 1 week ago, now with emphysematous/ruptured acute cholecystitis with associated perihepatic fluid collection. EXAM: CT PERC DRAIN PERITONEAL ABCESS; CT PERCUTANEOUS CHOLECYSTOSTOMY COMPARISON:  None Available. MEDICATIONS: The patient is currently admitted to the hospital and receiving intravenous antibiotics. The  antibiotics were administered within an appropriate time frame prior to the initiation of the procedure. ANESTHESIA/SEDATION: Moderate (  conscious) sedation was employed during this procedure. A total of Versed 0.5 mg and Fentanyl 25 mcg was administered intravenously. Moderate Sedation Time: 16 minutes. The patient's level of consciousness and vital signs were monitored continuously by radiology nursing throughout the procedure under my direct supervision. CONTRAST:  None COMPLICATIONS: None immediate. PROCEDURE: RADIATION DOSE REDUCTION: This exam was performed according to the departmental dose-optimization program which includes automated exposure control, adjustment of the mA and/or kV according to patient size and/or use of iterative reconstruction technique. Informed written consent was obtained from the patient after a discussion of the risks, benefits and alternatives to treatment. The patient was placed supine on the CT gantry and a pre procedural CT was performed re-demonstrating the known abscess/fluid collection within the gallbladder and perihepatic region. The procedure was planned. A timeout was performed prior to the initiation of the procedure. The right upper quadrant was prepped and draped in the usual sterile fashion. The overlying soft tissues were anesthetized with 1% lidocaine with epinephrine. Appropriate trajectories were planned with the use of a 22 gauge spinal needle. Two separate 18 gauge trocar needles were advanced into the gallbladder as well as into the perihepatic fluid collection and short Amplatz super stiff wires were coiled within the gallbladder and perihepatic fluid collection. Appropriate positioning was confirmed with a limited CT scan. The tracks were serially dilated allowing placement of 2, 10 French all-purpose drainage catheters. Appropriate positioning was confirmed with a limited postprocedural CT scan. Approximately 100 ml of bilious and purulent fluid was aspirated  from the gallbladder in a proximally 500 mL of similar appearing, bilious and purulent fluid was aspirated from the perihepatic fluid collection. The drains were connected to a bulb suction and sutured in place. Dressings were placed. The patient tolerated the procedure well without immediate post procedural complication. IMPRESSION: 1. Successful CT guided placement of a 10 French percutaneous cholecystostomy drain with aspiration of 100 mL of bilious and purulent fluid. Samples were sent to the laboratory as requested by the ordering clinical team. 2. Successful CT guided placement of a 10 French right upper quadrant perihepatic fluid collection drain with aspiration of 500 mL of bilious and purulent fluid. Marliss Coots, MD [facility] Radiology Electronically Signed   By: Marliss Coots M.D.   On: 09/30/2022 13:19   CT GUIDED PERITONEAL/RETROPERITONEAL FLUID DRAIN BY PERC CATH  Result Date: 09/30/2022 INDICATION: 77 year old male with history recently diagnosed advanced stage pancreatic cancer with biliary obstruction status post covered common bile duct stent placement at outside facility approximally 1 week ago, now with emphysematous/ruptured acute cholecystitis with associated perihepatic fluid collection. EXAM: CT PERC DRAIN PERITONEAL ABCESS; CT PERCUTANEOUS CHOLECYSTOSTOMY COMPARISON:  None Available. MEDICATIONS: The patient is currently admitted to the hospital and receiving intravenous antibiotics. The antibiotics were administered within an appropriate time frame prior to the initiation of the procedure. ANESTHESIA/SEDATION: Moderate (conscious) sedation was employed during this procedure. A total of Versed 0.5 mg and Fentanyl 25 mcg was administered intravenously. Moderate Sedation Time: 16 minutes. The patient's level of consciousness and vital signs were monitored continuously by radiology nursing throughout the procedure under my direct  supervision. CONTRAST:  None COMPLICATIONS: None immediate. PROCEDURE: RADIATION DOSE REDUCTION: This exam was performed according to the departmental dose-optimization program which includes automated exposure control, adjustment of the mA and/or kV according to patient size and/or use of iterative reconstruction technique. Informed written consent was obtained from the patient after a discussion of the risks, benefits and alternatives to  treatment. The patient was placed supine on the CT gantry and a pre procedural CT was performed re-demonstrating the known abscess/fluid collection within the gallbladder and perihepatic region. The procedure was planned. A timeout was performed prior to the initiation of the procedure. The right upper quadrant was prepped and draped in the usual sterile fashion. The overlying soft tissues were anesthetized with 1% lidocaine with epinephrine. Appropriate trajectories were planned with the use of a 22 gauge spinal needle. Two separate 18 gauge trocar needles were advanced into the gallbladder as well as into the perihepatic fluid collection and short Amplatz super stiff wires were coiled within the gallbladder and perihepatic fluid collection. Appropriate positioning was confirmed with a limited CT scan. The tracks were serially dilated allowing placement of 2, 10 French all-purpose drainage catheters. Appropriate positioning was confirmed with a limited postprocedural CT scan. Approximately 100 ml of bilious and purulent fluid was aspirated from the gallbladder in a proximally 500 mL of similar appearing, bilious and purulent fluid was aspirated from the perihepatic fluid collection. The drains were connected to a bulb suction and sutured in place. Dressings were placed. The patient tolerated the procedure well without immediate post procedural complication. IMPRESSION: 1. Successful CT guided placement of a 10 French percutaneous cholecystostomy drain with aspiration of 100 mL of  bilious and purulent fluid. Samples were sent to the laboratory as requested by the ordering clinical team. 2. Successful CT guided placement of a 10 French right upper quadrant perihepatic fluid collection drain with aspiration of 500 mL of bilious and purulent fluid. Marliss Coots, MD [facility] Electronically Signed   By: Marliss Coots M.D.   On: 09/30/2022 13:19   CT ABDOMEN PELVIS WO CONTRAST  Addendum Date: 09/29/2022   ADDENDUM REPORT: 09/29/2022 23:18 ADDENDUM: These results were called by telephone at the time of interpretation on 09/29/2022 at 11:14 pm to provider Dr. Everlene Farrier, who verbally acknowledged these results. Electronically Signed   By: Tish Frederickson M.D.   On: 09/29/2022 23:18   Result Date: 09/29/2022 CLINICAL DATA:  Bowel obstruction suspected EXAM: CT ABDOMEN AND PELVIS WITHOUT CONTRAST TECHNIQUE: Multidetector CT imaging of the abdomen and pelvis was performed following the standard protocol without IV contrast. RADIATION DOSE REDUCTION: This exam was performed according to the departmental dose-optimization program which includes automated exposure control, adjustment of the mA and/or kV according to patient size and/or use of iterative reconstruction technique. COMPARISON:  CT abdomen pelvis 09/26/2022 FINDINGS: Lower chest: Partially visualized bilateral small pleural effusions with bilateral lower lobe passive atelectasis. Hepatobiliary: Redemonstration of a couple of hepatic lesions consistent with known metastases. Air-fluid level within the gastric lumen. Air noted within the wall of the gallbladder. Common bile duct stent in appropriate position with associated pneumobilia. Pancreas: Proximal pancreatic mass again noted in difficult to delineate on this study. Finding measures approximately 12.6 x 4.7 cm extending along the liver and duodenum. No surrounding inflammatory changes. No main pancreatic ductal dilatation.  Spleen: Normal in size without focal abnormality. Adrenals/Urinary Tract: No adrenal nodule bilaterally. No nephrolithiasis and no hydronephrosis. Several fluid density lesions likely represent simple renal cysts. Simple renal cysts, in the absence of clinically indicated signs/symptoms, require no independent follow-up. No ureterolithiasis or hydroureter. The urinary bladder is decompressed with Foley catheter tip and balloon terminating within the lumen. Stomach/Bowel: Stomach distended with air-fluid level. Bowel thickening of the first portion of the duodenum suggestive of possible metastatic invasion. No evidence of large bowel wall thickening or  dilatation. Colonic diverticulosis. Appendix appears normal. Vascular/Lymphatic: No abdominal aorta or iliac aneurysm. Moderate atherosclerotic plaque of the aorta and its branches. No abdominal, pelvic, or inguinal lymphadenopathy. Reproductive: Prostate is unremarkable. Other: Interval increase in trace to small volume simple free fluid within the right upper quadrant. Associated free gas. No intraperitoneal free gas. No organized fluid collection. Musculoskeletal: Bilateral inguinal hernias containing fat and fluid. Subcutaneus soft tissue edema. No suspicious lytic or blastic osseous lesions. No acute displaced fracture. Multilevel degenerative changes of the spine. IMPRESSION: 1. Acute emphysematous cholecystitis. Recommend emergent surgical consultation. 2. Interval development of pneumoperitoneum. 3. Interval increase in small volume perihepatic ascites. 4. Gastric lumen distended with air-fluid level. Developing obstruction along the first portion of duodenum in the region of the pancreatic malignancy not excluded. 5. Metastatic pancreatic malignancy poorly evaluated on this noncontrast study. Please see CT abdomen pelvis 09/26/2022 for further details. 6. Partially visualized bilateral small pleural effusions with bilateral lower lobe passive atelectasis. 7.   Aortic Atherosclerosis (ICD10-I70.0). These results were called by telephone at the time of interpretation on 09/29/2022 at 10:37 pm to provider Dr. Karna Christmas, who verbally acknowledged these results. Electronically Signed: By: Tish Frederickson M.D. On: 09/29/2022 22:44   US THORACENTESIS ASP PLEURAL SPACE W/IMG GUIDE  Result Date: 09/29/2022 INDICATION: Right pleural effusion.  Pancreatic cancer. EXAM: ULTRASOUND GUIDED RIGHT THORACENTESIS MEDICATIONS: None. COMPLICATIONS: None immediate. PROCEDURE: An ultrasound guided thoracentesis was thoroughly discussed with the patient and questions answered. The benefits, risks, alternatives and complications were also discussed. The patient understands and wishes to proceed with the procedure. Written consent was obtained. Ultrasound was performed to localize and mark an adequate pocket of fluid in the right chest. The area was then prepped and draped in the normal sterile fashion. 1% Lidocaine was used for local anesthesia. Under ultrasound guidance a 6 Fr Safe-T-Centesis catheter was introduced. Thoracentesis was performed. The catheter was removed and a dressing applied. FINDINGS: A total of approximately 800 mL of dark yellow fluid was removed. Samples were sent to the laboratory as requested by the clinical team. Images were also obtained of the right upper abdomen to evaluate the gallbladder. Loculated perihepatic ascites is identified. Previously identified gallbladder is poorly characterized because there appears to be air within the gallbladder. The amount of fluid around the liver has significantly increased since the right upper quadrant ultrasound 06/27/2022. IMPRESSION: 1. Successful ultrasound guided right thoracentesis yielding 800 mL of pleural fluid. 2. Images of the right upper quadrant demonstrate markedly increased perihepatic ascites. Ascites appears to be complex. In addition, the gallbladder now appears to contain a large amount of gas which is  likely related to the biliary stent and pneumobilia. Electronically Signed   By: Richarda Overlie M.D.   On: 09/29/2022 15:52   DG Chest Port 1 View  Result Date: 09/29/2022 CLINICAL DATA:  Pleural effusion.  Status post thoracentesis. EXAM: PORTABLE CHEST 1 VIEW COMPARISON:  Chest radiographs 09/28/2022 and 09/26/2022. Abdominal CT 09/26/2022. FINDINGS: 1417 hours. Interval decreased volume of right pleural effusion. A left pleural effusion and left basilar airspace disease appear unchanged. No pneumothorax. The heart size and mediastinal contours are stable. Moderate gaseous distension of the stomach noted. Curvilinear lucency in the right upper quadrant corresponds with air in the gallbladder lumen on recent CT. Patient has a biliary stent. IMPRESSION: 1. Decreased right pleural effusion following thoracentesis. No pneumothorax. 2. No significant change in left pleural effusion and left basilar airspace disease. Electronically Signed   By: Hilarie Fredrickson.D.  On: 09/29/2022 15:46   ECHOCARDIOGRAM COMPLETE  Result Date: 09/29/2022    ECHOCARDIOGRAM REPORT   Patient Name:   GIOVANY DEHOFF Date of Exam: 09/29/2022 Medical Rec #:  [id number]       Height:       72.0 in Accession #:    [id number]      Weight:       163.0 lb Date of Birth:  [birth year]       BSA:          1.953 m Patient Age:    76 years        BP:           112/74 mmHg Patient Gender: M               HR:           115 bpm. Exam Location:  [facility] Procedure: 2D Echo, Cardiac Doppler and Color Doppler Indications:     Atrial Fibrillation I48.91  History:         Patient has no prior history of Echocardiogram examinations.                  Arrythmias:Atrial Fibrillation; Risk Factors:Diabetes and                  Hypertension.  Sonographer:     Cristela Blue Referring Phys:  [id number] SONA PATEL Diagnosing Phys: Yvonne Kendall MD IMPRESSIONS  1. Left ventricular ejection fraction, by estimation, is 60 to 65%. The left ventricle has normal function. The left  ventricle has no regional wall motion abnormalities. There is mild asymmetric left ventricular hypertrophy of the basal-septal segment. Left ventricular diastolic function could not be evaluated.  2. Pulmonary artery pressure is mildly elevated (RVSP 30-35 mmHg plus central venous/right atrial pressure). Right ventricular systolic function is normal. The right ventricular size is normal.  3. Moderate pleural effusion in the right lateral region.  4. The mitral valve is normal in structure. Mild to moderate mitral valve regurgitation.  5. Tricuspid valve regurgitation is mild to moderate.  6. The aortic valve is tricuspid. Aortic valve regurgitation is mild. No aortic stenosis is present. FINDINGS  Left Ventricle: Left ventricular ejection fraction, by estimation, is 60 to 65%. The left ventricle has normal function. The left ventricle has no regional wall motion abnormalities. The left ventricular internal cavity size was normal in size. There is  mild asymmetric left ventricular hypertrophy of the basal-septal segment. Left ventricular diastolic function could not be evaluated due to atrial fibrillation. Left ventricular diastolic function could not be evaluated. Right Ventricle: Pulmonary artery pressure is mildly elevated (RVSP 30-35 mmHg plus central venous/right atrial pressure). The right ventricular size is normal. No increase in right ventricular wall thickness. Right ventricular systolic function is normal. Left Atrium: Left atrial size was normal in size. Right Atrium: Right atrial size was normal in size. Pericardium: The pericardium was not well visualized. Mitral Valve: The mitral valve is normal in structure. Mild to moderate mitral valve regurgitation. Tricuspid Valve: The tricuspid valve is normal in structure. Tricuspid valve regurgitation is mild to moderate. Aortic Valve: The aortic valve is tricuspid. Aortic valve regurgitation is mild. No aortic stenosis is present. Aortic valve mean gradient  measures 3.0 mmHg. Aortic valve peak gradient measures 4.7 mmHg. Aortic valve area, by VTI measures 3.27 cm. Pulmonic Valve: The pulmonic valve was grossly normal. Pulmonic valve regurgitation is trivial. No evidence of pulmonic stenosis. Aorta: The aortic root is normal in  size and structure. Pulmonary Artery: The pulmonary artery is not well seen. Venous: The inferior vena cava was not well visualized. IAS/Shunts: The interatrial septum was not well visualized. Additional Comments: There is a moderate pleural effusion in the right lateral region.  LEFT VENTRICLE PLAX 2D LVIDd:         3.90 cm LVIDs:         2.60 cm LV PW:         0.90 cm LV IVS:        1.20 cm LVOT diam:     2.20 cm LV SV:         47 LV SV Index:   24 LVOT Area:     3.80 cm  RIGHT VENTRICLE RV Basal diam:  3.50 cm RV Mid diam:    2.80 cm LEFT ATRIUM             Index        RIGHT ATRIUM           Index LA diam:        4.30 cm 2.20 cm/m   RA Area:     16.20 cm LA Vol (A2C):   70.1 ml 35.89 ml/m  RA Volume:   34.00 ml  17.41 ml/m LA Vol (A4C):   47.7 ml 24.42 ml/m LA Biplane Vol: 60.7 ml 31.08 ml/m  AORTIC VALVE AV Area (Vmax):    2.81 cm AV Area (Vmean):   2.73 cm AV Area (VTI):     3.27 cm AV Vmax:           108.00 cm/s AV Vmean:          74.350 cm/s AV VTI:            0.143 m AV Peak Grad:      4.7 mmHg AV Mean Grad:      3.0 mmHg LVOT Vmax:         79.80 cm/s LVOT Vmean:        53.400 cm/s LVOT VTI:          0.123 m LVOT/AV VTI ratio: 0.86  AORTA Ao Root diam: 3.66 cm MITRAL VALVE               TRICUSPID VALVE MV Area (PHT): 3.63 cm    TR Peak grad:   32.9 mmHg MV Decel Time: 209 msec    TR Vmax:        287.00 cm/s MV E velocity: 86.50 cm/s                            SHUNTS                            Systemic VTI:  0.12 m                            Systemic Diam: 2.20 cm Yvonne Kendall MD Electronically signed by Yvonne Kendall MD Signature Date/Time: 09/29/2022/2:31:41 PM    Final    US RENAL  Result Date: 09/29/2022 CLINICAL  DATA:  Acute renal failure. EXAM: RENAL / URINARY TRACT ULTRASOUND COMPLETE COMPARISON:  September 26, 2022. FINDINGS: Right Kidney: Renal measurements: 9.6 x 5.1 x 4.7 cm = volume: 122 mL. At least 2 parapelvic cysts are noted, the largest measuring 2.3 cm. Increased echogenicity of renal parenchyma is noted. No mass  or hydronephrosis visualized. Left Kidney: Renal measurements: 10.1 x 5.9 x 5.5 cm = volume: 170 mL. Multi septated cyst measuring 2.3 x 2.3 x 1.9 cm is noted. 2.5 cm simple cyst is noted. Increased echogenicity of renal parenchyma is noted. No hydronephrosis visualized. Bladder: Appears normal for degree of bladder distention. Other: None. IMPRESSION: 2.3 cm multi-septated complex cyst seen in left kidney. Follow-up ultrasound in 6-12 months is recommended to ensure stability. Increased echogenicity of renal parenchyma is noted bilaterally suggesting medical renal disease. No hydronephrosis or renal obstruction is noted. Electronically Signed   By: Lupita Raider M.D.   On: 09/29/2022 13:28   DG Chest Port 1 View  Result Date: 09/28/2022 CLINICAL DATA:  Shortness of breath. EXAM: PORTABLE CHEST 1 VIEW COMPARISON:  September 26, 2022 FINDINGS: Calcific atherosclerotic disease of the aorta. Cardiomediastinal silhouette is enlarged. Mediastinal contours appear intact. Bilateral pleural effusions, right greater than left. No focal airspace consolidation. Osseous structures are without acute abnormality. Soft tissues are grossly normal. IMPRESSION: 1. Bilateral pleural effusions, right greater than left. 2. Enlarged cardiomediastinal silhouette. Electronically Signed   By: Ted Mcalpine M.D.   On: 09/28/2022 15:37   US Abdomen Limited RUQ (LIVER/GB)  Result Date: 09/26/2022 CLINICAL DATA:  Abdominal pain. History of metastatic pancreatic cancer. EXAM: ULTRASOUND ABDOMEN LIMITED RIGHT UPPER QUADRANT COMPARISON:  CT abdomen pelvis from same day. FINDINGS: Gallbladder: No gallstones. Sludge with mild  gallbladder wall thickening and small volume pericholecystic fluid. Positive sonographic Murphy sign noted by sonographer. Common bile duct: Diameter: 4 mm, normal.  Common bile duct stent noted. Liver: Liver lesions seen on CT are not well identified by ultrasound. Within normal limits in parenchymal echogenicity. Portal vein is patent on color Doppler imaging with normal direction of blood flow towards the liver. Other: None. IMPRESSION: 1. Gallbladder sludge with mild gallbladder wall thickening and small volume pericholecystic fluid. Positive sonographic Murphy sign. Findings are concerning for acute acalculous cholecystitis. 2. Liver metastases seen on CT are not well identified by ultrasound. Electronically Signed   By: Obie Dredge M.D.   On: 09/26/2022 14:56   CT ABDOMEN PELVIS W CONTRAST  Result Date: 09/26/2022 CLINICAL DATA:  Central chest pain. Abdominal pain. History of pancreatic cancer. EXAM: CT ABDOMEN AND PELVIS WITH CONTRAST TECHNIQUE: Multidetector CT imaging of the abdomen and pelvis was performed using the standard protocol following bolus administration of intravenous contrast. RADIATION DOSE REDUCTION: This exam was performed according to the departmental dose-optimization program which includes automated exposure control, adjustment of the mA and/or kV according to patient size and/or use of iterative reconstruction technique. CONTRAST:  80mL OMNIPAQUE IOHEXOL 300 MG/ML  SOLN COMPARISON:  Chest radiograph 09/26/2022. Outside CTs from [facility] dated 09/21/2022 and 09/22/2022. Outside images are unavailable but reports are available. FINDINGS: Lower chest: Multiple small pulmonary nodules in the visualized lungs. Index pulmonary nodule is in the right lower lobe along the right major fissure on image 33/4 measuring up to 1.3 cm. There also may be a larger nodule in the left lower lobe measuring 1.4 cm image 15/4. Volume loss and compressive atelectasis in the left lower lobe. Compressive  atelectasis in the right lower lobe. Bilateral small pleural effusions. Hepatobiliary: Multiple ill-defined hypoechoic liver lesions compatible with metastatic disease. Index lesion in the left hepatic lobe measuring 2.1 cm on image 23/2. There is gas and contrast within the gallbladder. The gallbladder is moderately distended and findings compatible with recent ERCP. Metallic biliary stent in the common bile duct. Narrowing in the  biliary stent associated with the pancreatic mass. Small amount of pneumobilia. Small amount of fluid and stranding around the liver and gallbladder. Pancreas: Large poorly defined pancreatic head mass with multiple hypoechoic components. Difficult to accurately measure the mass itself but it roughly measures 4.0 x 4.9 x 5.1 cm. Dilatation of the main pancreatic duct. The mass extends cephalad and appears to be involving the caudate lobe. There is a caudate lesion measuring up to 4.1 cm. In addition, there is hypoechoic implant or fluid collection cephalad to the caudate lobe involving the hepatic subcapsular space. This subcapsular collection or implant measures 7.8 x 4.6 x 5.6 cm. This was present on the previous exam based on the prior report. This caudate/subcapsular component causing mass effect on the adjacent IVC. Mild stranding and edema throughout the porta hepatis region. Spleen: Small splenic calcifications.  Spleen is normal for size. Adrenals/Urinary Tract: Normal adrenal glands. Bilateral cortical and parapelvic renal cysts. No suspicious renal lesions. No hydronephrosis. Bladder is decompressed. There is probably a small bladder diverticulum along the posterior aspect of the bladder. Cannot exclude mild bladder wall thickening. Asymmetric left perinephric stranding. Stomach/Bowel: Normal appearance of the stomach. No bowel dilatation. Colonic diverticula involving the sigmoid colon. No evidence for acute bowel inflammation. Vascular/Lymphatic: Atherosclerotic disease  involving the abdominal aorta without aneurysm. Atherosclerotic calcifications at the origin of the SMA. Atherosclerotic calcifications at the origin of the bilateral renal arteries. IVC is patent but there is compression in the suprarenal IVC due to the subcapsular fluid collection / mass. 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 Main portal vein is small but patent. Intrahepatic portal veins are patent. 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 Splenic vein is patent. Pathologic lymphadenopathy in the retrocaval space measuring up to 1.7 cm on image 39/2. Reproductive: Prostate contains calcifications, asymmetric towards the left. Other: Small amount of free fluid in the abdomen and pelvis. Musculoskeletal: Evidence for an old right tenth rib fracture. Bilateral inguinal hernias containing fat. Small amount of fluid in the left inguinal hernia. No suspicious osseous lesion. IMPRESSION: 1. Large pancreatic head mass compatible with the patient's history of pancreatic cancer. The mass extends cephalad and involves the caudate lobe. There is also a subcapsular fluid collection or implant cephalad to the caudate lobe causing mass effect on the adjacent IVC. 2. Multiple liver lesions, abdominal lymphadenopathy and multiple pulmonary nodules. Findings compatible with metastatic disease. 3. Metallic biliary stent in the common bile duct and there is a small amount of pneumobilia. No significant intrahepatic biliary dilatation. In addition, there is moderate distention of the gallbladder containing iodinated contrast and small amount of gas. Findings compatible with recent ERCP. Gallbladder distension could be a source of abdominal pain. 4. Bilateral small pleural effusions with compressive atelectasis in the lower lobes. 5. Small amount of free fluid in the abdomen and pelvis. 6. Colonic diverticulosis  without evidence for acute diverticulitis. 7. Aortic Atherosclerosis (ICD10-I70.0). 8. 9 mm splenic artery aneurysm. Electronically Signed   By: Richarda Overlie M.D.   On: 09/26/2022 09:50   DG Chest 2 View  Result Date: 09/26/2022 CLINICAL DATA:  77 year old male with history of central chest pain. EXAM: CHEST - 2 VIEW COMPARISON:  Chest x-ray 07/28/2014. FINDINGS: Lung volumes are low. No consolidative airspace disease. Trace bilateral pleural effusions with bibasilar opacities that are favored to reflect subsegmental atelectasis. No pneumothorax. No evidence of pulmonary edema.  No definite suspicious appearing pulmonary nodules or masses are noted. Heart size is normal. Upper mediastinal contours are within normal limits. Atherosclerotic calcifications in the thoracic aorta. IMPRESSION: 1. Low lung volumes with trace bilateral pleural effusions and bibasilar opacities favored to reflect areas of subsegmental atelectasis. 2. Aortic atherosclerosis. Electronically Signed   By: Trudie Reed M.D.   On: 09/26/2022 06:19    PERFORMANCE STATUS (ECOG) : 4 - Bedbound  Review of Systems Unless otherwise noted, a complete review of systems is negative.  Physical Exam General: Frail HEENT: NGT Pulmonary: Unlabored Extremities: no edema, no joint deformities Skin: no rashes Neurological: Weakness but otherwise nonfocal  IMPRESSION: Patient with emphysematous cholecystitis with pneumoperitoneum.  He is status post thoracentesis and percutaneous cholecystostomy tube and perihepatic drain placement.  Follow-up visit.  Patient now out of the ICU but overall was doing poorly.  Oral intake remains minimal. He has an NGT but has been intolerant of tube feeds. ID following for persistent leukocytosis.  Patient drowsy during my visit.  Per wife and granddaughter, patient has told them he is ready to die.  Family plan to talk with him in more detail when he is more alert regarding his wishes.  Family seemed to  recognize that patient is likely nearing end of life. He is not a candidate for cancer treatment at present given his poor performance status.  Discussed option of de-escalating care and transferring patient home with hospice, where family say he would want to be at end-of-life.  PLAN: -Continue current scope of treatment -Family discussing goals -Will follow  Case and plan discussed with Drs. Cathie Hoops and [place]  Time Total: 25 minutes  Visit consisted of counseling and education dealing with the complex and emotionally intense issues of symptom management and palliative care in the setting of serious and potentially life-threatening illness.Greater than 50%  of this time was spent counseling and coordinating care related to the above assessment and plan.  Signed by: Laurette Schimke, PhD, NP-C

## 2022-10-12 NOTE — H&P (Addendum)
Chief Complaint: -Advanced stage pancreatic cancer complicated by common bile duct stenosis requiring stent placement complicated by pneumoperitoneum with abscess -S/p cholecystostomy tube placement 09/30/22 -S/p perihepatic drain placement 09/30/22  Referring Physician(s): * No referring provider recorded for this case *  Supervising Physician: Pernell Dupre  Patient Status: [facility] - In-pt  History of Present Illness: Cameron Gutierrez is a 77 y.o. male with PMH significant for atrial fibrillation, CKD stage 3b, newly diagnosed pancreatic cancer, type I diabetes mellitus, hypertension, and recent biliary stenting at [facility]. Patient presented to [facility] on 09/26/22 with abdominal pain. Work-up revealed concern for acalculous acute cholecystitis. IR was consulted to evaluate patient for possible percutaneous cholecystostomy drain as surgery team feels patient is not a good candidate for cholecystectomy.   Past Medical History:  Diagnosis Date   Atrial fibrillation (HCC)    Cancer (HCC)    pancreatic   Diabetes mellitus without complication (HCC)    Hypercholesteremia    Hypertension     Past Surgical History:  Procedure Laterality Date   KNEE SURGERY     REPLACEMENT TOTAL KNEE Right    SHOULDER SURGERY      Allergies: Patient has no known allergies.  Medications: Prior to Admission medications   Medication Sig Start Date End Date Taking? Authorizing Provider  amLODipine (NORVASC) 10 MG tablet Take 10 mg by mouth daily. 03/20/21  Yes [provider]  atorvastatin (LIPITOR) 40 MG tablet Take 40 mg by mouth every morning. 03/20/21  Yes [provider]  ELIQUIS 5 MG TABS tablet Take 5 mg by mouth 2 (two) times daily. 03/20/21  Yes [provider]  insulin glargine (LANTUS) 100 UNIT/ML injection Inject 18 Units into the skin daily.   Yes [provider]  lisinopril (ZESTRIL) 5 MG tablet Take 5 mg by mouth every morning. 03/02/21  Yes [provider]  oxyCODONE (OXY IR/ROXICODONE) 5 MG immediate release tablet Take 5 mg by mouth every 6 (six) hours as needed for severe pain.   Yes [provider]  blood glucose meter kit and supplies KIT Dispense based on patient and insurance preference. Use up to four times daily as directed. 03/27/22   Sherryll Burger, Pratik D, DO  Insulin Pen Needle (PEN NEEDLES 3/16") 31G X 5 MM MISC Take as directed with insulin pen. 03/27/22   Maurilio Lovely D, DO     History reviewed. No pertinent family history.  Social History   Socioeconomic History   Marital status: Married    Spouse name: Not on file   Number of children: Not on file   Years of education: Not on file   Highest education level: Not on file  Occupational History   Not on file  Tobacco Use   Smoking status: Never   Smokeless tobacco: Never  Substance and Sexual Activity   Alcohol use: Not Currently   Drug use: Never   Sexual activity: Not on file  Other Topics Concern   Not on file  Social History Narrative   ** Merged History Encounter **       Social Determinants of Health   Financial Resource Strain: Low Risk  (09/21/2022)   Received from [facility]   Overall Financial Resource Strain (CARDIA)    Difficulty of Paying Living Expenses: Not very hard  Food Insecurity: No Food Insecurity (09/26/2022)   Hunger Vital Sign    Worried About Running Out of Food in the Last Year: Never true    Ran Out  of Food in the Last Year: Never true  Transportation Needs: No Transportation Needs (09/26/2022)   PRAPARE - Administrator, Civil Service (Medical): No    Lack of Transportation (Non-Medical): No  Physical Activity: Not on file  Stress: No Stress Concern Present (09/21/2022)   Received from [facility] of Occupational Health - Occupational Stress Questionnaire    Feeling of Stress : Not at all  Social Connections: Not on file    Code Status: Patient currently has DNR order in  place. Discussion with the patient's family regarding wishes.  The DNR order is modified such that limited attempts at resuscitation are clearly defined with regards to specific procedures.   [name], Gavin Pound, states that if the patient is in respiratory distress/is not breathing only, she consents to all resuscitative methods. If the patient does not have a pulse, she does not want any resuscitation attempted.   Review of Systems: A 12 point ROS discussed and pertinent positives are indicated in the HPI above.  All other systems are negative.  Review of Systems  Reason unable to perform ROS: Patient somewhat obtunded.    Vital Signs: BP 104/62 (BP Location: Right Arm)   Pulse 91   Temp 98.5 F (36.9 C)   Resp 18   Ht 6' (1.829 m)   Wt 198 lb 6.6 oz (90 kg)   SpO2 91%   BMI 26.91 kg/m    Physical Exam Vitals reviewed.  Constitutional:      General: He is not in acute distress.    Appearance: He is ill-appearing.     Comments: Patient somewhat obtunded. He is somnolent, arouses to voice, but falls asleep quickly without participating in conversation  Cardiovascular:     Rate and Rhythm: Normal rate and regular rhythm.     Pulses: Normal pulses.     Heart sounds: Normal heart sounds.  Pulmonary:     Effort: Pulmonary effort is normal.     Comments: Lung sounds diminished on right; crackles at bilateral lung bases Abdominal:     Palpations: Abdomen is soft.     Tenderness: There is no abdominal tenderness.     Comments: RUQ drains x2. Drain 1 with ~10-15 mL of bilious output. Drain 2 with ~25 mL of serosanguineous appearing fluid. Both insertion sites without concern for infection  Musculoskeletal:     Right lower leg: No edema.     Left lower leg: No edema.  Skin:    General: Skin is warm and dry.  Neurological:     Mental Status: He is disoriented.     Imaging: DG Chest Port 1 View  Result Date: 10/10/2022 CLINICAL DATA:  200808 Hypoxia [id number] EXAM:  PORTABLE CHEST 1 VIEW COMPARISON:  September 29, 2022, October 10, 2022 FINDINGS: The cardiomediastinal silhouette is unchanged in contour.Weighted tip feeding tube tip terminates over the distal stomach. Layering RIGHT pleural effusion, grossly similar in comparison to prior CT but increased since July 16. RIGHT-sided perihepatic drain. RIGHT-sided percutaneous cholecystostomy. No pneumothorax. Hazy opacities throughout the RIGHT lung, likely atelectasis. LEFT basilar platelike opacity, likely atelectasis. IMPRESSION: Layering RIGHT pleural effusion, grossly similar in comparison to prior CT but increased since July 16. Electronically Signed   By: Meda Klinefelter M.D.   On: 10/10/2022 15:56   CT ABDOMEN PELVIS WO CONTRAST  Result Date: 10/10/2022 CLINICAL DATA:  Right upper quadrant abdominal pain. History of pancreatic mass. EXAM: CT ABDOMEN AND PELVIS WITHOUT CONTRAST TECHNIQUE: Multidetector  CT imaging of the abdomen and pelvis was performed following the standard protocol without IV contrast. RADIATION DOSE REDUCTION: This exam was performed according to the departmental dose-optimization program which includes automated exposure control, adjustment of the mA and/or kV according to patient size and/or use of iterative reconstruction technique. COMPARISON:  October 05, 2022. FINDINGS: Lower chest: Bilateral pleural effusions are noted with adjacent atelectasis of both lower lobes, right greater than left. Hepatobiliary: Stable position of percutaneous drainage catheter seen into right subdiaphragmatic space. Stable position of cholecystostomy tube seen within gallbladder lumen. There remains gas within the wall the gallbladder suggesting emphysematous cholecystitis. Common bile duct stent is again noted. Multiple hepatic masses are again noted consistent with metastatic disease. Left hepatic pneumobilia is noted. Pancreas: There remains large mass involving pancreatic head and body consistent with malignancy which  is not significantly changed compared with prior exam performed 5 days ago. Spleen: Multiple calcified splenic granulomas are noted. Adrenals/Urinary Tract: Adrenal glands and kidneys appear normal. No hydronephrosis or renal obstruction is noted. Urinary bladder is decompressed secondary to Foley catheter. Stomach/Bowel: Feeding tube is seen in proximal duodenum. Stomach is otherwise unremarkable. There is no evidence of bowel obstruction or inflammation. The appendix appears normal. Sigmoid diverticulosis without inflammation. Vascular/Lymphatic: Aortic atherosclerosis. Retroperitoneal adenopathy is again noted and stable, including 2 cm retrocaval lymph node. Reproductive: Prostate is unremarkable. Other: Moderate anasarca is noted. Mild ascites is noted, including fluid around the liver and spleen. Mild to moderate size bilateral inguinal hernias are noted which contain fat and fluid. Musculoskeletal: No acute or significant osseous findings. IMPRESSION: Bilateral pleural effusions are noted with associated atelectasis of both lower lobes, right greater than left. Stable position of percutaneous cholecystostomy tube. Stable findings consistent with emphysematous cholecystitis. Stable position of percutaneous drainage catheter into right subdiaphragmatic space. Stable large mass involving pancreatic head and body with stent noted in common bile duct. Multiple hepatic metastatic lesions are again noted and stable. Moderate anasarca is noted.  Mild ascites is noted. Stable retroperitoneal adenopathy consistent metastatic disease. Electronically Signed   By: Lupita Raider M.D.   On: 10/10/2022 13:49   DG Abd 1 View  Result Date: 10/09/2022 CLINICAL DATA:  Abdominal pain.  History of pancreatic cancer. EXAM: ABDOMEN - 1 VIEW COMPARISON:  CT 10/05/2022 FINDINGS: Tip of the weighted enteric tube in the right upper quadrant in the region of the distal stomach. There is gaseous gastric distension. Tube biliary  drains and a biliary stent are in place. Small to moderate volume of stool in the colon. Increased air throughout mildly dilated small bowel in the central abdomen. Pelvic calcifications consistent with phleboliths. Vascular calcifications are seen. IMPRESSION: 1. Tip of the weighted enteric tube in the right upper quadrant in the region of the distal stomach. Gaseous gastric distension. 2. Increased air throughout mildly dilated small bowel in the central abdomen, may be ileus or enteritis. Electronically Signed   By: Narda Rutherford M.D.   On: 10/09/2022 13:09   DG Basil Dess Tube Plc W/Fl W/Rad  Result Date: 10/07/2022 CLINICAL DATA:  Provided history: Dysphagia. Request received for NG tube placement under fluoroscopy. EXAM: NASO G TUBE PLACEMENT WITH FL AND WITH RAD CONTRAST:  None FLUOROSCOPY: Radiation Exposure Index (if provided by the fluoroscopic device): 6.30 mGy air kerma COMPARISON:  CT abdomen/pelvis 10/05/2022. FINDINGS: The tip of the enteric tube was lubricated and gently inserted into the right nostril. Under fluoroscopic guidance, the tube easily advanced into the esophagus and subsequently into the  stomach. The tip of the nasogastric tube reach the gastric antrum. The procedure was performed by Alwyn Ren NP, supervised by Dr. Jackey Loge. IMPRESSION: Technically successful fluoroscopically-guided placement of a 10 French nasogastric feeding tube. The tip of the tube is located within the gastric antrum. Electronically Signed   By: Jackey Loge D.O.   On: 10/07/2022 16:05   CT ABDOMEN PELVIS WO CONTRAST  Result Date: 10/05/2022 CLINICAL DATA:  Ascites. Recent diagnosis of pancreatic cancer and biliary obstruction status post common bile duct stent placement. A facet [place]/structured acute cholecystitis. EXAM: CT ABDOMEN AND PELVIS WITHOUT CONTRAST TECHNIQUE: Multidetector CT imaging of the abdomen and pelvis was performed following the standard protocol without IV contrast. RADIATION  DOSE REDUCTION: This exam was performed according to the departmental dose-optimization program which includes automated exposure control, adjustment of the mA and/or kV according to patient size and/or use of iterative reconstruction technique. COMPARISON:  CT abdomen pelvis dated 09/29/2022. FINDINGS: Evaluation of this exam is limited in the absence of intravenous contrast. Lower chest: Partially visualized small bilateral pleural effusions and consolidative changes of the majority of the visualized lower lobes with air bronchograms which may represent atelectasis or pneumonia. A 5 mm nodule in the lingula as well as a 6 mm left lower lobe nodule. Small pockets of pneumoperitoneum in the upper abdomen likely introduced via drainage catheter placement. There is a small ascites. Hepatobiliary: Hypodense mass involving the caudate lobe of the liver measure up to 7.4 cm in keeping with metastatic disease. Additional ill-defined hypodense lesions within the liver also consistent with metastatic disease. There is mild dilatation and pneumobilia. The gallbladder is poorly visualized. There is air in the gallbladder in keeping with emphysematous cholecystitis. A percutaneous cholecystostomy with pigtail tip in the body of the gallbladder noted. There is debris within the gallbladder. Pancreas: Ill-defined mass at the head of the pancreas measuring 6.5 x 7.3 cm in keeping with known malignancy. This mass appears to encase the common bile duct. A common bowel duct stent is noted. There is air in the proximal lumen of the stent. There is debris or infiltrative mass in the distal lumen of the stent. Spleen: Normal in size without focal abnormality. Adrenals/Urinary Tract: The adrenal glands are unremarkable. Mild bilateral hydronephrosis versus parapelvic cysts. No renal calculi noted. The visualized ureters appear unremarkable. The urinary bladder is decompressed around a Foley catheter. Stomach/Bowel: There is sigmoid  diverticulosis without active inflammatory changes. There is no bowel obstruction. The appendix is suboptimally visualized but grossly unremarkable. Vascular/Lymphatic: Mild aortoiliac atherosclerotic disease. The IVC is unremarkable. No portal venous gas. Retroperitoneal adenopathy including a 2 cm retrocaval lymph node. Reproductive: The prostate and seminal vesicles are grossly unremarkable. Other: Small fat containing bilateral inguinal hernia. There is diffuse subcutaneous edema. Musculoskeletal: No acute osseous pathology. IMPRESSION: 1. 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 2. Pancreatic head mass in keeping with known malignancy. 3. Hepatic metastatic disease. 4. Retroperitoneal adenopathy. 5. Small ascites. 6. Small bilateral pleural effusions and bibasilar atelectasis or infiltrate. 7. Aortic Atherosclerosis (ICD10-I70.0) and Emphysema (ICD10-J43.9). Electronically Signed   By: Elgie Collard M.D.   On: 10/05/2022 19:23   DG Abd 1 View  Result Date: 09/30/2022 CLINICAL DATA:  NG tube placement EXAM: ABDOMEN - 1 VIEW COMPARISON:  CT abdomen/pelvis 1 day prior FINDINGS: The enteric catheter tip and sidehole are in the stomach. The stomach remains significantly distended. Air in the gallbladder lumen and wall is noted consistent with the findings of emphysematous cholecystitis seen  on CT from 1 day prior. The free intraperitoneal air seen on that study is not definitely seen on this study. There is gaseous distention of the bowel in the imaged abdomen. A biliary stent is noted. IMPRESSION: 1. Enteric catheter tip and sidehole in the stomach which remains distended. 2. Air in the gallbladder lumen and wall consistent with the findings of emphysematous cholecystitis on the CT study from 1 day prior. The pneumoperitoneum seen on that study is not appreciated on the current study. Electronically Signed   By: Lesia Hausen M.D.   On: 09/30/2022 13:28   CT PERC  CHOLECYSTOSTOMY  Result Date: 09/30/2022 INDICATION: 77 year old male with history recently diagnosed advanced stage pancreatic cancer with biliary obstruction status post covered common bile duct stent placement at outside facility approximally 1 week ago, now with emphysematous/ruptured acute cholecystitis with associated perihepatic fluid collection. EXAM: CT PERC DRAIN PERITONEAL ABCESS; CT PERCUTANEOUS CHOLECYSTOSTOMY COMPARISON:  None Available. MEDICATIONS: The patient is currently admitted to the hospital and receiving intravenous antibiotics. The antibiotics were administered within an appropriate time frame prior to the initiation of the procedure. ANESTHESIA/SEDATION: Moderate (conscious) sedation was employed during this procedure. A total of Versed 0.5 mg and Fentanyl 25 mcg was administered intravenously. Moderate Sedation Time: 16 minutes. The patient's level of consciousness and vital signs were monitored continuously by radiology nursing throughout the procedure under my direct supervision. CONTRAST:  None COMPLICATIONS: None immediate. PROCEDURE: RADIATION DOSE REDUCTION: This exam was performed according to the departmental dose-optimization program which includes automated exposure control, adjustment of the mA and/or kV according to patient size and/or use of iterative reconstruction technique. Informed written consent was obtained from the patient after a discussion of the risks, benefits and alternatives to treatment. The patient was placed supine on the CT gantry and a pre procedural CT was performed re-demonstrating the known abscess/fluid collection within the gallbladder and perihepatic region. The procedure was planned. A timeout was performed prior to the initiation of the procedure. The right upper quadrant was prepped and draped in the usual sterile fashion. The overlying soft tissues were anesthetized with 1% lidocaine with epinephrine. Appropriate trajectories were planned with the  use of a 22 gauge spinal needle. Two separate 18 gauge trocar needles were advanced into the gallbladder as well as into the perihepatic fluid collection and short Amplatz super stiff wires were coiled within the gallbladder and perihepatic fluid collection. Appropriate positioning was confirmed with a limited CT scan. The tracks were serially dilated allowing placement of 2, 10 French all-purpose drainage catheters. Appropriate positioning was confirmed with a limited postprocedural CT scan. Approximately 100 ml of bilious and purulent fluid was aspirated from the gallbladder in a proximally 500 mL of similar appearing, bilious and purulent fluid was aspirated from the perihepatic fluid collection. The drains were connected to a bulb suction and sutured in place. Dressings were placed. The patient tolerated the procedure well without immediate post procedural complication. IMPRESSION: 1. Successful CT guided placement of a 10 French percutaneous cholecystostomy drain with aspiration of 100 mL of bilious and purulent fluid. Samples were sent to the laboratory as requested by the ordering clinical team. 2. Successful CT guided placement of a 10 French right upper quadrant perihepatic fluid collection drain with aspiration of 500 mL of bilious and purulent fluid. Marliss Coots, MD [facility] Radiology Electronically Signed   By: Marliss Coots M.D.   On: 09/30/2022 13:19   CT GUIDED PERITONEAL/RETROPERITONEAL FLUID DRAIN BY PERC CATH  Result Date: 09/30/2022 INDICATION: 77 year old male with history recently diagnosed advanced stage pancreatic cancer with biliary obstruction status post covered common bile duct stent placement at outside facility approximally 1 week ago, now with emphysematous/ruptured acute cholecystitis with associated perihepatic fluid collection. EXAM: CT PERC DRAIN PERITONEAL ABCESS; CT PERCUTANEOUS CHOLECYSTOSTOMY COMPARISON:  None Available.  MEDICATIONS: The patient is currently admitted to the hospital and receiving intravenous antibiotics. The antibiotics were administered within an appropriate time frame prior to the initiation of the procedure. ANESTHESIA/SEDATION: Moderate (conscious) sedation was employed during this procedure. A total of Versed 0.5 mg and Fentanyl 25 mcg was administered intravenously. Moderate Sedation Time: 16 minutes. The patient's level of consciousness and vital signs were monitored continuously by radiology nursing throughout the procedure under my direct supervision. CONTRAST:  None COMPLICATIONS: None immediate. PROCEDURE: RADIATION DOSE REDUCTION: This exam was performed according to the departmental dose-optimization program which includes automated exposure control, adjustment of the mA and/or kV according to patient size and/or use of iterative reconstruction technique. Informed written consent was obtained from the patient after a discussion of the risks, benefits and alternatives to treatment. The patient was placed supine on the CT gantry and a pre procedural CT was performed re-demonstrating the known abscess/fluid collection within the gallbladder and perihepatic region. The procedure was planned. A timeout was performed prior to the initiation of the procedure. The right upper quadrant was prepped and draped in the usual sterile fashion. The overlying soft tissues were anesthetized with 1% lidocaine with epinephrine. Appropriate trajectories were planned with the use of a 22 gauge spinal needle. Two separate 18 gauge trocar needles were advanced into the gallbladder as well as into the perihepatic fluid collection and short Amplatz super stiff wires were coiled within the gallbladder and perihepatic fluid collection. Appropriate positioning was confirmed with a limited CT scan. The tracks were serially dilated allowing placement of 2, 10 French all-purpose drainage catheters. Appropriate positioning was  confirmed with a limited postprocedural CT scan. Approximately 100 ml of bilious and purulent fluid was aspirated from the gallbladder in a proximally 500 mL of similar appearing, bilious and purulent fluid was aspirated from the perihepatic fluid collection. The drains were connected to a bulb suction and sutured in place. Dressings were placed. The patient tolerated the procedure well without immediate post procedural complication. IMPRESSION: 1. Successful CT guided placement of a 10 French percutaneous cholecystostomy drain with aspiration of 100 mL of bilious and purulent fluid. Samples were sent to the laboratory as requested by the ordering clinical team. 2. Successful CT guided placement of a 10 French right upper quadrant perihepatic fluid collection drain with aspiration of 500 mL of bilious and purulent fluid. Marliss Coots, MD [facility] Radiology Electronically Signed   By: Marliss Coots M.D.   On: 09/30/2022 13:19   CT ABDOMEN PELVIS WO CONTRAST  Addendum Date: 09/29/2022   ADDENDUM REPORT: 09/29/2022 23:18 ADDENDUM: These results were called by telephone at the time of interpretation on 09/29/2022 at 11:14 pm to provider Dr. Everlene Farrier, who verbally acknowledged these results. Electronically Signed   By: Tish Frederickson M.D.   On: 09/29/2022 23:18   Result Date: 09/29/2022 CLINICAL DATA:  Bowel obstruction suspected EXAM: CT ABDOMEN AND PELVIS WITHOUT CONTRAST TECHNIQUE: Multidetector CT imaging of the abdomen and pelvis was performed following the standard protocol without IV contrast. RADIATION DOSE REDUCTION: This exam was performed according to the departmental dose-optimization program which includes automated exposure control, adjustment of the mA and/or  kV according to patient size and/or use of iterative reconstruction technique. COMPARISON:  CT abdomen pelvis 09/26/2022 FINDINGS: Lower chest: Partially visualized bilateral small pleural  effusions with bilateral lower lobe passive atelectasis. Hepatobiliary: Redemonstration of a couple of hepatic lesions consistent with known metastases. Air-fluid level within the gastric lumen. Air noted within the wall of the gallbladder. Common bile duct stent in appropriate position with associated pneumobilia. Pancreas: Proximal pancreatic mass again noted in difficult to delineate on this study. Finding measures approximately 12.6 x 4.7 cm extending along the liver and duodenum. No surrounding inflammatory changes. No main pancreatic ductal dilatation. Spleen: Normal in size without focal abnormality. Adrenals/Urinary Tract: No adrenal nodule bilaterally. No nephrolithiasis and no hydronephrosis. Several fluid density lesions likely represent simple renal cysts. Simple renal cysts, in the absence of clinically indicated signs/symptoms, require no independent follow-up. No ureterolithiasis or hydroureter. The urinary bladder is decompressed with Foley catheter tip and balloon terminating within the lumen. Stomach/Bowel: Stomach distended with air-fluid level. Bowel thickening of the first portion of the duodenum suggestive of possible metastatic invasion. No evidence of large bowel wall thickening or dilatation. Colonic diverticulosis. Appendix appears normal. Vascular/Lymphatic: No abdominal aorta or iliac aneurysm. Moderate atherosclerotic plaque of the aorta and its branches. No abdominal, pelvic, or inguinal lymphadenopathy. Reproductive: Prostate is unremarkable. Other: Interval increase in trace to small volume simple free fluid within the right upper quadrant. Associated free gas. No intraperitoneal free gas. No organized fluid collection. Musculoskeletal: Bilateral inguinal hernias containing fat and fluid. Subcutaneus soft tissue edema. No suspicious lytic or blastic osseous lesions. No acute displaced fracture. Multilevel degenerative changes of the spine. IMPRESSION: 1. Acute emphysematous  cholecystitis. Recommend emergent surgical consultation. 2. Interval development of pneumoperitoneum. 3. Interval increase in small volume perihepatic ascites. 4. Gastric lumen distended with air-fluid level. Developing obstruction along the first portion of duodenum in the region of the pancreatic malignancy not excluded. 5. Metastatic pancreatic malignancy poorly evaluated on this noncontrast study. Please see CT abdomen pelvis 09/26/2022 for further details. 6. Partially visualized bilateral small pleural effusions with bilateral lower lobe passive atelectasis. 7.  Aortic Atherosclerosis (ICD10-I70.0). These results were called by telephone at the time of interpretation on 09/29/2022 at 10:37 pm to provider Dr. Karna Christmas, who verbally acknowledged these results. Electronically Signed: By: Tish Frederickson M.D. On: 09/29/2022 22:44   US THORACENTESIS ASP PLEURAL SPACE W/IMG GUIDE  Result Date: 09/29/2022 INDICATION: Right pleural effusion.  Pancreatic cancer. EXAM: ULTRASOUND GUIDED RIGHT THORACENTESIS MEDICATIONS: None. COMPLICATIONS: None immediate. PROCEDURE: An ultrasound guided thoracentesis was thoroughly discussed with the patient and questions answered. The benefits, risks, alternatives and complications were also discussed. The patient understands and wishes to proceed with the procedure. Written consent was obtained. Ultrasound was performed to localize and mark an adequate pocket of fluid in the right chest. The area was then prepped and draped in the normal sterile fashion. 1% Lidocaine was used for local anesthesia. Under ultrasound guidance a 6 Fr Safe-T-Centesis catheter was introduced. Thoracentesis was performed. The catheter was removed and a dressing applied. FINDINGS: A total of approximately 800 mL of dark yellow fluid was removed. Samples were sent to the laboratory as requested by the clinical team. Images were also obtained of the right upper abdomen to evaluate the gallbladder. Loculated  perihepatic ascites is identified. Previously identified gallbladder is poorly characterized because there appears to be air within the gallbladder. The amount of fluid around the liver has significantly increased since the right upper quadrant ultrasound 06/27/2022. IMPRESSION: 1.  Successful ultrasound guided right thoracentesis yielding 800 mL of pleural fluid. 2. Images of the right upper quadrant demonstrate markedly increased perihepatic ascites. Ascites appears to be complex. In addition, the gallbladder now appears to contain a large amount of gas which is likely related to the biliary stent and pneumobilia. Electronically Signed   By: Richarda Overlie M.D.   On: 09/29/2022 15:52   DG Chest Port 1 View  Result Date: 09/29/2022 CLINICAL DATA:  Pleural effusion.  Status post thoracentesis. EXAM: PORTABLE CHEST 1 VIEW COMPARISON:  Chest radiographs 09/28/2022 and 09/26/2022. Abdominal CT 09/26/2022. FINDINGS: 1417 hours. Interval decreased volume of right pleural effusion. A left pleural effusion and left basilar airspace disease appear unchanged. No pneumothorax. The heart size and mediastinal contours are stable. Moderate gaseous distension of the stomach noted. Curvilinear lucency in the right upper quadrant corresponds with air in the gallbladder lumen on recent CT. Patient has a biliary stent. IMPRESSION: 1. Decreased right pleural effusion following thoracentesis. No pneumothorax. 2. No significant change in left pleural effusion and left basilar airspace disease. Electronically Signed   By: Carey Bullocks M.D.   On: 09/29/2022 15:46   ECHOCARDIOGRAM COMPLETE  Result Date: 09/29/2022    ECHOCARDIOGRAM REPORT   Patient Name:   Cameron Gutierrez Date of Exam: 09/29/2022 Medical Rec #:  [id number]       Height:       72.0 in Accession #:    [id number]      Weight:       163.0 lb Date of Birth:  11-05-1945       BSA:          1.953 m Patient Age:    76 years        BP:           112/74 mmHg Patient Gender: M                HR:           115 bpm. Exam Location:  [facility] Procedure: 2D Echo, Cardiac Doppler and Color Doppler Indications:     Atrial Fibrillation I48.91  History:         Patient has no prior history of Echocardiogram examinations.                  Arrythmias:Atrial Fibrillation; Risk Factors:Diabetes and                  Hypertension.  Sonographer:     Cristela Blue Referring Phys:  [id number] SONA PATEL Diagnosing Phys: Yvonne Kendall MD IMPRESSIONS  1. Left ventricular ejection fraction, by estimation, is 60 to 65%. The left ventricle has normal function. The left ventricle has no regional wall motion abnormalities. There is mild asymmetric left ventricular hypertrophy of the basal-septal segment. Left ventricular diastolic function could not be evaluated.  2. Pulmonary artery pressure is mildly elevated (RVSP 30-35 mmHg plus central venous/right atrial pressure). Right ventricular systolic function is normal. The right ventricular size is normal.  3. Moderate pleural effusion in the right lateral region.  4. The mitral valve is normal in structure. Mild to moderate mitral valve regurgitation.  5. Tricuspid valve regurgitation is mild to moderate.  6. The aortic valve is tricuspid. Aortic valve regurgitation is mild. No aortic stenosis is present. FINDINGS  Left Ventricle: Left ventricular ejection fraction, by estimation, is 60 to 65%. The left ventricle has normal function. The left ventricle has no regional wall motion abnormalities. The left ventricular internal  cavity size was normal in size. There is  mild asymmetric left ventricular hypertrophy of the basal-septal segment. Left ventricular diastolic function could not be evaluated due to atrial fibrillation. Left ventricular diastolic function could not be evaluated. Right Ventricle: Pulmonary artery pressure is mildly elevated (RVSP 30-35 mmHg plus central venous/right atrial pressure). The right ventricular size is normal. No increase in right ventricular wall  thickness. Right ventricular systolic function is normal. Left Atrium: Left atrial size was normal in size. Right Atrium: Right atrial size was normal in size. Pericardium: The pericardium was not well visualized. Mitral Valve: The mitral valve is normal in structure. Mild to moderate mitral valve regurgitation. Tricuspid Valve: The tricuspid valve is normal in structure. Tricuspid valve regurgitation is mild to moderate. Aortic Valve: The aortic valve is tricuspid. Aortic valve regurgitation is mild. No aortic stenosis is present. Aortic valve mean gradient measures 3.0 mmHg. Aortic valve peak gradient measures 4.7 mmHg. Aortic valve area, by VTI measures 3.27 cm. Pulmonic Valve: The pulmonic valve was grossly normal. Pulmonic valve regurgitation is trivial. No evidence of pulmonic stenosis. Aorta: The aortic root is normal in size and structure. Pulmonary Artery: The pulmonary artery is not well seen. Venous: The inferior vena cava was not well visualized. IAS/Shunts: The interatrial septum was not well visualized. Additional Comments: There is a moderate pleural effusion in the right lateral region.  LEFT VENTRICLE PLAX 2D LVIDd:         3.90 cm LVIDs:         2.60 cm LV PW:         0.90 cm LV IVS:        1.20 cm LVOT diam:     2.20 cm LV SV:         47 LV SV Index:   24 LVOT Area:     3.80 cm  RIGHT VENTRICLE RV Basal diam:  3.50 cm RV Mid diam:    2.80 cm LEFT ATRIUM             Index        RIGHT ATRIUM           Index LA diam:        4.30 cm 2.20 cm/m   RA Area:     16.20 cm LA Vol (A2C):   70.1 ml 35.89 ml/m  RA Volume:   34.00 ml  17.41 ml/m LA Vol (A4C):   47.7 ml 24.42 ml/m LA Biplane Vol: 60.7 ml 31.08 ml/m  AORTIC VALVE AV Area (Vmax):    2.81 cm AV Area (Vmean):   2.73 cm AV Area (VTI):     3.27 cm AV Vmax:           108.00 cm/s AV Vmean:          74.350 cm/s AV VTI:            0.143 m AV Peak Grad:      4.7 mmHg AV Mean Grad:      3.0 mmHg LVOT Vmax:         79.80 cm/s LVOT Vmean:         53.400 cm/s LVOT VTI:          0.123 m LVOT/AV VTI ratio: 0.86  AORTA Ao Root diam: 3.66 cm MITRAL VALVE               TRICUSPID VALVE MV Area (PHT): 3.63 cm    TR Peak grad:   32.9 mmHg MV  Decel Time: 209 msec    TR Vmax:        287.00 cm/s MV E velocity: 86.50 cm/s                            SHUNTS                            Systemic VTI:  0.12 m                            Systemic Diam: 2.20 cm Yvonne Kendall MD Electronically signed by Yvonne Kendall MD Signature Date/Time: 09/29/2022/2:31:41 PM    Final    US RENAL  Result Date: 09/29/2022 CLINICAL DATA:  Acute renal failure. EXAM: RENAL / URINARY TRACT ULTRASOUND COMPLETE COMPARISON:  September 26, 2022. FINDINGS: Right Kidney: Renal measurements: 9.6 x 5.1 x 4.7 cm = volume: 122 mL. At least 2 parapelvic cysts are noted, the largest measuring 2.3 cm. Increased echogenicity of renal parenchyma is noted. No mass or hydronephrosis visualized. Left Kidney: Renal measurements: 10.1 x 5.9 x 5.5 cm = volume: 170 mL. Multi septated cyst measuring 2.3 x 2.3 x 1.9 cm is noted. 2.5 cm simple cyst is noted. Increased echogenicity of renal parenchyma is noted. No hydronephrosis visualized. Bladder: Appears normal for degree of bladder distention. Other: None. IMPRESSION: 2.3 cm multi-septated complex cyst seen in left kidney. Follow-up ultrasound in 6-12 months is recommended to ensure stability. Increased echogenicity of renal parenchyma is noted bilaterally suggesting medical renal disease. No hydronephrosis or renal obstruction is noted. Electronically Signed   By: Lupita Raider M.D.   On: 09/29/2022 13:28   DG Chest Port 1 View  Result Date: 09/28/2022 CLINICAL DATA:  Shortness of breath. EXAM: PORTABLE CHEST 1 VIEW COMPARISON:  September 26, 2022 FINDINGS: Calcific atherosclerotic disease of the aorta. Cardiomediastinal silhouette is enlarged. Mediastinal contours appear intact. Bilateral pleural effusions, right greater than left. No focal airspace consolidation.  Osseous structures are without acute abnormality. Soft tissues are grossly normal. IMPRESSION: 1. Bilateral pleural effusions, right greater than left. 2. Enlarged cardiomediastinal silhouette. Electronically Signed   By: Ted Mcalpine M.D.   On: 09/28/2022 15:37   US Abdomen Limited RUQ (LIVER/GB)  Result Date: 09/26/2022 CLINICAL DATA:  Abdominal pain. History of metastatic pancreatic cancer. EXAM: ULTRASOUND ABDOMEN LIMITED RIGHT UPPER QUADRANT COMPARISON:  CT abdomen pelvis from same day. FINDINGS: Gallbladder: No gallstones. Sludge with mild gallbladder wall thickening and small volume pericholecystic fluid. Positive sonographic Murphy sign noted by sonographer. Common bile duct: Diameter: 4 mm, normal.  Common bile duct stent noted. Liver: Liver lesions seen on CT are not well identified by ultrasound. Within normal limits in parenchymal echogenicity. Portal vein is patent on color Doppler imaging with normal direction of blood flow towards the liver. Other: None. IMPRESSION: 1. Gallbladder sludge with mild gallbladder wall thickening and small volume pericholecystic fluid. Positive sonographic Murphy sign. Findings are concerning for acute acalculous cholecystitis. 2. Liver metastases seen on CT are not well identified by ultrasound. Electronically Signed   By: Obie Dredge M.D.   On: 09/26/2022 14:56   CT ABDOMEN PELVIS W CONTRAST  Result Date: 09/26/2022 CLINICAL DATA:  Central chest pain. Abdominal pain. History of pancreatic cancer. EXAM: CT ABDOMEN AND PELVIS WITH CONTRAST TECHNIQUE: Multidetector CT imaging of the abdomen and pelvis was performed using the standard protocol following  bolus administration of intravenous contrast. RADIATION DOSE REDUCTION: This exam was performed according to the departmental dose-optimization program which includes automated exposure control, adjustment of the mA and/or kV according to patient size and/or use of iterative reconstruction technique.  CONTRAST:  80mL OMNIPAQUE IOHEXOL 300 MG/ML  SOLN COMPARISON:  Chest radiograph 09/26/2022. Outside CTs from [facility] dated 09/21/2022 and 09/22/2022. Outside images are unavailable but reports are available. FINDINGS: Lower chest: Multiple small pulmonary nodules in the visualized lungs. Index pulmonary nodule is in the right lower lobe along the right major fissure on image 33/4 measuring up to 1.3 cm. There also may be a larger nodule in the left lower lobe measuring 1.4 cm image 15/4. Volume loss and compressive atelectasis in the left lower lobe. Compressive atelectasis in the right lower lobe. Bilateral small pleural effusions. Hepatobiliary: Multiple ill-defined hypoechoic liver lesions compatible with metastatic disease. Index lesion in the left hepatic lobe measuring 2.1 cm on image 23/2. There is gas and contrast within the gallbladder. The gallbladder is moderately distended and findings compatible with recent ERCP. Metallic biliary stent in the common bile duct. Narrowing in the biliary stent associated with the pancreatic mass. Small amount of pneumobilia. Small amount of fluid and stranding around the liver and gallbladder. Pancreas: Large poorly defined pancreatic head mass with multiple hypoechoic components. Difficult to accurately measure the mass itself but it roughly measures 4.0 x 4.9 x 5.1 cm. Dilatation of the main pancreatic duct. The mass extends cephalad and appears to be involving the caudate lobe. There is a caudate lesion measuring up to 4.1 cm. In addition, there is hypoechoic implant or fluid collection cephalad to the caudate lobe involving the hepatic subcapsular space. This subcapsular collection or implant measures 7.8 x 4.6 x 5.6 cm. This was present on the previous exam based on the prior report. This caudate/subcapsular component causing mass effect on the adjacent IVC. Mild stranding and edema throughout the porta hepatis region. Spleen: Small splenic calcifications.  Spleen is  normal for size. Adrenals/Urinary Tract: Normal adrenal glands. Bilateral cortical and parapelvic renal cysts. No suspicious renal lesions. No hydronephrosis. Bladder is decompressed. There is probably a small bladder diverticulum along the posterior aspect of the bladder. Cannot exclude mild bladder wall thickening. Asymmetric left perinephric stranding. Stomach/Bowel: Normal appearance of the stomach. No bowel dilatation. Colonic diverticula involving the sigmoid colon. No evidence for acute bowel inflammation. Vascular/Lymphatic: Atherosclerotic disease involving the abdominal aorta without aneurysm. Atherosclerotic calcifications at the origin of the SMA. Atherosclerotic calcifications at the origin of the bilateral renal arteries. IVC is patent but there is compression in the suprarenal IVC due to the subcapsular fluid collection / mass. 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 Main portal vein is small but patent. Intrahepatic portal veins are patent. 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 Splenic vein is patent. Pathologic lymphadenopathy in the retrocaval space measuring up to 1.7 cm on image 39/2. Reproductive: Prostate contains calcifications, asymmetric towards the left. Other: Small amount of free fluid in the abdomen and pelvis. Musculoskeletal: Evidence for an old right tenth rib fracture. Bilateral inguinal hernias containing fat. Small amount of fluid in the left inguinal hernia. No suspicious osseous lesion. IMPRESSION: 1. Large pancreatic head mass compatible with the patient's history of pancreatic cancer. The mass extends cephalad and involves the caudate lobe. There is also a subcapsular fluid collection or implant cephalad to the  caudate lobe causing mass effect on the adjacent IVC. 2. Multiple liver lesions, abdominal lymphadenopathy and multiple pulmonary nodules. Findings  compatible with metastatic disease. 3. Metallic biliary stent in the common bile duct and there is a small amount of pneumobilia. No significant intrahepatic biliary dilatation. In addition, there is moderate distention of the gallbladder containing iodinated contrast and small amount of gas. Findings compatible with recent ERCP. Gallbladder distension could be a source of abdominal pain. 4. Bilateral small pleural effusions with compressive atelectasis in the lower lobes. 5. Small amount of free fluid in the abdomen and pelvis. 6. Colonic diverticulosis without evidence for acute diverticulitis. 7. Aortic Atherosclerosis (ICD10-I70.0). 8. 9 mm splenic artery aneurysm. Electronically Signed   By: Richarda Overlie M.D.   On: 09/26/2022 09:50   DG Chest 2 View  Result Date: 09/26/2022 CLINICAL DATA:  77 year old male with history of central chest pain. EXAM: CHEST - 2 VIEW COMPARISON:  Chest x-ray 07/28/2014. FINDINGS: Lung volumes are low. No consolidative airspace disease. Trace bilateral pleural effusions with bibasilar opacities that are favored to reflect subsegmental atelectasis. No pneumothorax. No evidence of pulmonary edema. No definite suspicious appearing pulmonary nodules or masses are noted. Heart size is normal. Upper mediastinal contours are within normal limits. Atherosclerotic calcifications in the thoracic aorta. IMPRESSION: 1. Low lung volumes with trace bilateral pleural effusions and bibasilar opacities favored to reflect areas of subsegmental atelectasis. 2. Aortic atherosclerosis. Electronically Signed   By: Trudie Reed M.D.   On: 09/26/2022 06:19    Labs:  CBC: Recent Labs    10/09/22 0437 10/10/22 0428 10/11/22 0521 10/12/22 0535  WBC 19.6* 20.2* 16.7* 14.5*  HGB 9.3* 8.7* 8.6* 7.5*  HCT 27.6* 25.0* 24.7* 22.3*  PLT 263 266 234 262    COAGS: Recent Labs    09/27/22 1421 09/28/22 0916 09/28/22 1607 09/29/22 0949 09/29/22 2246 09/30/22 0334 09/30/22 0851  10/01/22 0013  INR 1.7* 1.7*  --  2.0*  --  2.0*  --   --   APTT  --   --    < > 100* 128*  --  114* 128*   < > = values in this interval not displayed.    BMP: Recent Labs    10/09/22 0437 10/10/22 0428 10/11/22 0426 10/12/22 0535  NA 128* 127* 130* 130*  K 4.2 4.7 5.0 4.6  CL 98 98 100 99  CO2 24 22 22 24   GLUCOSE 166* 174* 141* 159*  BUN 30* 34* 38* 40*  CALCIUM 7.6* 7.8* 7.7* 7.8*  CREATININE 1.10 1.04 1.09 1.14  GFRNONAA >60 >60 >60 >60    LIVER FUNCTION TESTS: Recent Labs    10/02/22 1044 10/03/22 0408 10/04/22 0358 10/05/22 0631 10/05/22 0633 10/06/22 0619  BILITOT 2.5*  --  1.6* 1.9*  --  1.7*  AST 844*  --  607* 284*  --  138*  ALT 310*  --  288* 198*  --  139*  ALKPHOS 685*  --  767* 631*  --  512*  PROT 4.7*  --  4.8* 5.2*  --  4.9*  ALBUMIN 1.9*   < > 1.9*  1.8* 2.0* 1.9* 1.7*   < > = values in this interval not displayed.    TUMOR MARKERS: No results for input(s): "AFPTM", "CEA", "CA199", "CHROMGRNA" in the last 8760 hours.  Assessment and Plan:  Perihepatic fluid collection s/p aspiration/drainage.  Cholecystitis s/p percutaneous cholecystostomy tube placement Right pleural effusion   Both drains remain in place.  Insertion  sites are intact.   There is ongoing cloudy yellow fluid from the perihepatic drain- 20-40 mL/day.  There is ongoing drainage from the percutaneous cholecystostomy.  Per chole flushes, however aspirates slowly.  Patient to undergo image-guided cholangiogram with possible drain upsize today.  Image-guided thoracentesis also requested by patient's team. This is to be done in conjunction with cholangiogram  Risks and benefits of image-guided cholangiogram with possible drain upsize discussed with the patient's wife, Gavin Pound, including, but not limited to bleeding, infection, gallbladder perforation, bile leak, sepsis or even death.  Risks and benefits of image-guided thoracentesis with patient's wife including but not limited  to bleeding, infection, pneumothorax, or even death.  All of the patient's wife's questions were answered, patient's wife is agreeable to proceed. Consent signed by Janalee Dane and in chart.     Thank you for this interesting consult.  I greatly enjoyed meeting Cameron Gutierrez and look forward to participating in their care.  A copy of this report was sent to the requesting provider on this date.  Electronically Signed: Kennieth Francois, PA-C 10/12/2022, 11:48 AM   I spent a total of 20 Minutes  in face to face in clinical consultation, greater than 50% of which was counseling/coordinating care for Perihepatic fluid collection s/p aspiration/drainage and cholecystitis s/p percutaneous cholecystostomy tube placement

## 2022-10-12 NOTE — Plan of Care (Signed)
  Problem: Coping: Goal: Ability to adjust to condition or change in health will improve Outcome: Progressing   Problem: Fluid Volume: Goal: Ability to maintain a balanced intake and output will improve Outcome: Progressing   Problem: Nutritional: Goal: Maintenance of adequate nutrition will improve Outcome: Progressing   Problem: Skin Integrity: Goal: Risk for impaired skin integrity will decrease Outcome: Progressing   Problem: Clinical Measurements: Goal: Will remain free from infection Outcome: Progressing   Problem: Nutrition: Goal: Adequate nutrition will be maintained Outcome: Progressing

## 2022-10-12 NOTE — Care Management Important Message (Signed)
Important Message  Patient Details  Name: Cameron Gutierrez MRN: [id number] Date of Birth: 02-Nov-1945   Medicare Important Message Given:  Yes     Bernadette Hoit 10/12/2022, 3:34 PM

## 2022-10-12 NOTE — Progress Notes (Signed)
Pt wife in the room. Chap introduced spiritual and emotional care to both Pt and family. Pt says he wants to go home but the doctor might have him go to rehab before going home. Mirna Mires provided active listening. [facility] services are available as need arise.   10/12/22 1300  Spiritual Encounters  Type of Visit Initial  Care provided to: Pt and family  Referral source Chaplain assessment  Reason for visit Routine spiritual support  OnCall Visit No  Spiritual Framework  [facility]

## 2022-10-12 NOTE — Progress Notes (Signed)
PROGRESS NOTE    Cameron Gutierrez  YQM:[id number] DOB: 1945-04-25 DOA: 09/26/2022 PCP: Dondra Spry, MD    Brief Narrative:  77 yo male who presented to [facility] ER on 07/13 with acute onset of severe abdominal/chest pain.  He was previously admitted at [facility] from 09/21/22-09/25/22 for abdominal pain along with unintentional weight loss.  During hospitalization at [facility] workup concerning for primary metastatic pancreatic adenocarcinoma.  He underwent ERCP/US with biopsy and stent placed on 09/23/22.  His pain was controlled post procedure, and pt discharged home on 09/25/22 (prescribed oxycodone and eliquis resumed).  Upon arrival to [facility] ER significant lab results were: Na+ 133/K+ 3.4/glucose 148/calcium 7.4/albumin 2.2/wbc 15.7.  CT Abd Pelvis concerning for moderate distension of the gallbladder but no findings of acute cholecystitis, however pt continued to c/o persistent abdominal pain.  EDP consulted with [facility] Oncologist Dr. Everardo All, and following review of imaging did not feel pt required transfer to [facility].  Pt subsequently admitted to [facility] medsurg unit per hospitalist team for additional workup and treatment due to continued abdominal pain.    Significant Hospital events :    07/13: Pt admitted to the medsurg unit with diffuse abdominal pain following recent ERCP with stent placement and newly diagnosed primary pancreatic adenocarcinoma with metastasis  [place] Abd Limited RUQ revealed Gallbladder sludge with mild gallbladder wall thickening and small volume pericholecystic fluid. Positive sonographic Murphy sign. Findings are concerning for acute acalculous cholecystitis. Liver metastases seen on CT are not well identified by ultrasound.   07/14: General surgery consulted for acute acalculous cholecystitis and recommended decompression of the gallbladder via percutaneous cholecystostomy per IR and continue abx therapy.    07/15: Pt arrived to special recovery for cholecystostomy drain placement and  pt noted to be in atrial fibrillation with rvr heart rate 140 to 170's.  Cardiology notified and the pt received 5 mg of iv cardizem x1 dose/250 ml NS bolus/20 mg of iv lasix.  Procedure canceled and pt transferred to the stepdown unit for cardizem gtt.  PCCM team consulted to assist with management.     09/29/22 patient in no distress, met with family today.  Awaiting percutaneous cholecystostomy and thoracentesis.  Testing for TLS today.   09/30/22 patient with emphysematous cholecystitis and pneumoperitoneum.  S/p throacentesis with partial clinical improvement.  Plan for IR consultation and cholecystostomy tube, completed by IR.     10/01/22 patient remains weak and chronically ill appearing.  He is s/p GB drain placement by IR and plan today to remove NGT and start PO nourishment.  He is being seen by palliative cancer specialist.   10/02/22 patient had BM overnight no longer with bowel obstruction does have NG still. Remains in AF on heparin, but vitals improved with low dose neo now. Palliative oncology following.  Patient wishes to have cancer therapy if possible. WBC count is normalized today.   10/03/22 : As patient was off vasopressors care was transitioned to hospitalist service.  No overnight events as per nursing.  Blood pressure remained stable off pressors with few soft readings.  Improvement in renal functions.  Nephrology following.  Patient tolerating full liquid diet.  NG can come out tomorrow.   10/04/2022: The patient was seen and examined at his bedside.  His wife was present in the room.  Passed a swallow evaluation.  Okay to do soft diet.  Will repeat LFTs and lipase level.  Will closely monitor to ensure tolerance to soft diet.   10/05/22:  Tolerating a soft  diet well.  NG tube removed.  CT scan ab/p wo contrast ordered due to up-trending leukocytosis.  ID consulted to assist with the management.   10/06/22: The patient was seen and examined at his bedside.  His son was present in  the room.  His leukocytosis continues to uptrend.  Started Merrem and DC'd the Zosyn today.  Appreciate infectious disease assistance.  7/24: No acute events overnight.  Oral intake remains poor.  Discussed with SLP and RD.  Plan to place image guided Dobbhoff and initiate tube feeds due to malnutrition  7/25: Transferred out of ICU.  Dobbhoff in place.  Tube feeds initiated.  7/29: Patient does not appear to be clinically improving.  Remains on tube feeds.  Unable to tolerate escalation past 30 cc/h.  Worsening hypoxia.  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  900 cc removed.   Poor respiratory effort  Weight up since admission  plan: Continue oxygen via nasal cannula wean as tolerated Pulmonary toileting Encourage incentive spirometry and flutter use Bronchodilators Ultrasound thoracentesis ordered  Acute Kidney Injury -KDIGO 3 Multifactorial including ischemic/metabolic Kidney function at or near baseline Off IV fluid. Holding home lisinopril Continue Foley Caution while receiving diuretics Daily labs   Acute urinary retention s/p foley cath placement on 09/28/22 Initially difficult to insert follow-up with urology Maintain Foley catheter for now   Permanent atrial fibrillation with rvr  Resolved hypotension secondary to antiarrhythmic medication  Patient transition to  p.o. amiodarone and heparin gtt. on 7/24 Plan: Amiodarone 200 mg p.o. twice daily Eliquis 5 mg twice daily   #Acalculous emphysematous cholecystitis: Status post decompression now with JP drain in place. Elevated white cell count Currently on meropenem White count continues to rise, unclear etiology Likely related underlying malignancy Switch back to Zosyn per ID on 7/26 CT abdomen without new fluid collection.  Drains appear appropriately placed Plan: Continue Zosyn ID follow-up Daily labs IR currently gram ordered for today   #Primary metastatic adenocarcinoma of the pancreas  - Oncology consulted appreciate input  Poor prognosis 7/29: Lengthy discussion with patient and wife at bedside.  Needs further discussion regarding goals of care.  Engaged reevaluation by palliative care Josh Borders.   #Type I diabetes mellitus with hyperglycemia Hemoglobin A1c 7.0 on 09/26/2022. - CBG's ac/hs - SSI    Physical debility Continue PT OT with assistance and fall precautions. Fall precautions.   Moderate protein calorie malnutrition BMI 25 Moderate muscle mass loss Encourage oral intake and increasing protein calorie intake. Dobbhoff tube placed 7/24 RD consulted for tube feeds  DVT prophylaxis: Eliquis Code Status: DNR Family Communication: Spouse at bedside 7/24, 7/25, 7/26, 7/27, 7/28, 7/29 Disposition Plan: Status is: Inpatient Remains inpatient appropriate because: Multiple acute issues as above   Level of care: Progressive  Consultants:  ID Cardiology  Procedures:  Thoracentesis  Antimicrobials: Zosyn   Subjective: Seen and Ament.  Resting in bed.  Appears more fatigued  this morning.  Wife at bedside  Objective: Vitals:   10/12/22 0300 10/12/22 0500 10/12/22 0503 10/12/22 0748  BP:  107/68  104/62  Pulse:  (!) 113  91  Resp:  20 17 18   Temp:  97.7 F (36.5 C)  98.5 F (36.9 C)  TempSrc:      SpO2:  (!) 73% 93% 91%  Weight: 90 kg     Height:         Intake/Output Summary (Last 24 hours) at 10/12/2022 1122 Last data filed at 10/12/2022 0449 Gross per 24 hour  Intake --  Output 1650 ml  Net -1650 ml   Filed Weights   10/09/22 0333 10/10/22 0318 10/12/22 0300  Weight: 90.9 kg 89.9 kg 90 kg    Examination:  General exam: Appears fatigued Respiratory system: Poor respiratory effort.  Bibasilar crackles.  Lung sounds diminished on right.  4 L Cardiovascular system: S1-S2, RRR, no murmur, no pedal edema Gastrointestinal system: Thin, soft, NT/ND, normal bowel sounds Central nervous system: Alert and oriented. No focal neurological deficits. Extremities: Decreased power bilateral lower extremities.  Gait not assessed Skin: No rashes, lesions or ulcers Psychiatry: Judgement and insight appear normal. Mood & affect appropriate.     Data Reviewed: I have personally reviewed following labs and imaging studies  CBC: Recent Labs  Lab 10/08/22 0437 10/09/22 0437 10/10/22 0428 10/11/22 0521 10/12/22 0535  WBC 17.6* 19.6* 20.2* 16.7* 14.5*  NEUTROABS 15.9* 17.8* 17.9* 14.6* 12.9*  HGB 10.0* 9.3* 8.7* 8.6* 7.5*  HCT 29.2* 27.6* 25.0* 24.7* 22.3*  MCV 81.6 83.1 81.4 82.9 84.8  PLT 257 263 266 234 262   Basic Metabolic Panel: Recent Labs  Lab 10/07/22 0556 10/08/22 0437 10/09/22 0437 10/10/22 0428 10/11/22 0426 10/12/22 0535  NA 126* 129* 128* 127* 130* 130*  K 3.5 4.1 4.2 4.7 5.0 4.6  CL 97* 97* 98 98 100 99  CO2 23 26 24 22 22 24   GLUCOSE 172* 180* 166* 174* 141* 159*  BUN 35* 29* 30* 34* 38* 40*  CREATININE 1.47* 1.28* 1.10 1.04 1.09 1.14  CALCIUM 7.4* 7.5* 7.6* 7.8* 7.7* 7.8*  MG 2.2 2.3 2.1 2.1 2.2  --   PHOS 2.7 2.0* 2.6 2.8 3.2  --    GFR: Estimated Creatinine Clearance: 61.5 mL/min (by C-G formula based on SCr of 1.14 mg/dL). Liver Function Tests: Recent Labs  Lab 10/06/22 0619 10/12/22 0535  AST 138* 153*  ALT 139* 110*  ALKPHOS 512* 265*  BILITOT 1.7* 1.2  PROT 4.9* 5.6*  ALBUMIN 1.7* 1.8*    No results for input(s): "LIPASE", "AMYLASE" in the last 168 hours.  No results for input(s): "AMMONIA" in the last 168 hours. Coagulation Profile: No results for input(s): "INR", "PROTIME" in the last 168 hours. Cardiac Enzymes: No results for input(s): "CKTOTAL", "CKMB", "CKMBINDEX", "TROPONINI" in the last 168 hours. BNP (last 3 results) No results for input(s): "PROBNP" in the last 8760 hours. HbA1C: No results for input(s): "HGBA1C" in the last 72 hours. CBG: Recent Labs  Lab 10/11/22 1746 10/11/22 2020 10/11/22 2352 10/12/22 0537 10/12/22 0749  GLUCAP 133* 143* 145* 131* 137*   Lipid Profile: No results for input(s): "CHOL", "HDL", "LDLCALC", "TRIG", "CHOLHDL", "LDLDIRECT" in the last 72 hours. Thyroid Function Tests: No results for input(s): "TSH", "T4TOTAL", "FREET4", "T3FREE", "THYROIDAB" in the last 72 hours. Anemia Panel: No results for input(s): "VITAMINB12", "FOLATE", "FERRITIN", "TIBC", "IRON", "RETICCTPCT" in the last 72 hours. Sepsis Labs: No results for input(s): "PROCALCITON", "LATICACIDVEN" in  the last 168 hours.   Recent Results (from the past 240 hour(s))  Culture, blood (Routine X 2) w Reflex to ID Panel     Status: None   Collection Time: 10/05/22  6:31 AM   Specimen: BLOOD LEFT HAND  Result Value Ref Range Status   Specimen Description BLOOD LEFT HAND  Final   Special Requests   Final    BOTTLES DRAWN AEROBIC AND ANAEROBIC Blood Culture results may not be optimal due to an inadequate volume of blood received in culture bottles   Culture   Final    NO GROWTH 5 DAYS Performed at [facility], [address] [place], [place] [address]    Report Status 10/10/2022 FINAL  Final  Culture, blood (Routine X 2) w Reflex to ID Panel     Status: None   Collection Time: 10/05/22  6:31 AM   Specimen: BLOOD RIGHT HAND  Result Value Ref Range Status   Specimen Description BLOOD RIGHT HAND  Final   Special Requests   Final    BOTTLES DRAWN  AEROBIC AND ANAEROBIC Blood Culture adequate volume   Culture   Final    NO GROWTH 5 DAYS Performed at [facility], [address], [place], [place] [address]    Report Status 10/10/2022 FINAL  Final         Radiology Studies: [facility] Chest Port 1 View  Result Date: 10/10/2022 CLINICAL DATA:  200808 Hypoxia [id number] EXAM: PORTABLE CHEST 1 VIEW COMPARISON:  September 29, 2022, October 10, 2022 FINDINGS: The cardiomediastinal silhouette is unchanged in contour.Weighted tip feeding tube tip terminates over the distal stomach. Layering RIGHT pleural effusion, grossly similar in comparison to prior CT but increased since July 16. RIGHT-sided perihepatic drain. RIGHT-sided percutaneous cholecystostomy. No pneumothorax. Hazy opacities throughout the RIGHT lung, likely atelectasis. LEFT basilar platelike opacity, likely atelectasis. IMPRESSION: Layering RIGHT pleural effusion, grossly similar in comparison to prior CT but increased since July 16. Electronically Signed   By: Meda Klinefelter M.D.   On: 10/10/2022 15:56        Scheduled Meds:  acetaminophen  500 mg Oral QHS   amiodarone  200 mg Oral BID   apixaban  5 mg Oral BID   atorvastatin  40 mg Oral q morning   Chlorhexidine Gluconate Cloth  6 each Topical QHS   feeding supplement  237 mL Oral TID BM   feeding supplement (PROSource TF20)  60 mL Per Tube Daily   free water  30 mL Per Tube Q4H   insulin aspart  0-9 Units Subcutaneous Q4H   insulin glargine-yfgn  8 Units Subcutaneous Daily   midodrine  10 mg Oral TID WC   mirtazapine  15 mg Oral QHS   multivitamin with minerals  1 tablet Oral Daily   mouth rinse  15 mL Mouth Rinse 4 times per day   senna-docusate  1 tablet Oral BID   simethicone  80 mg Oral QID   sodium chloride flush  5 mL Intracatheter Q8H   Continuous Infusions:  sodium chloride 250 mL (10/07/22 2153)   feeding supplement (VITAL 1.5 CAL) 30 mL/hr at 10/10/22 0600   meropenem (MERREM) IV 1 g (10/12/22 0544)    promethazine (PHENERGAN) injection (IM or IVPB)       LOS: 15 days       Tresa Moore, MD [facility]   If 7PM-7AM, please contact night-coverage  10/12/2022, 11:22 AM

## 2022-10-12 NOTE — Progress Notes (Signed)
Patient clinically stable post Chole tube exchange/thoracentesis. Tolerated well. Only Fentanyl 50 mcg iv given for procedure. Arouses easily with spoken voice, report given at bedside post  procedure/237,to care nurse.  with wife at bedside. Denies complaints post procedure.

## 2022-10-12 NOTE — TOC Progression Note (Signed)
Transition of Care [facility]) - Progression Note    Patient Details  Name: Cameron Gutierrez MRN: [id number] Date of Birth: 1945/12/27  Transition of Care [facility]) CM/SW Contact  Truddie Hidden, RN Phone Number: 10/12/2022, 4:01 PM  Clinical Narrative:    Retrieved message message from [facility]. Spoke with Meagan. She was previously requesting update on blood pressures. Meagan also stated facility is out of network. Medical Director is requesting update on patient.  Meagan updated per Attending note 7/29 . Auth withdrawn at this time.         Expected Discharge Plan and Services                                               Social Determinants of Health (SDOH) Interventions SDOH Screenings   Food Insecurity: No Food Insecurity (09/26/2022)  Housing: Low Risk  (09/26/2022)  Transportation Needs: No Transportation Needs (09/26/2022)  Utilities: Not At Risk (09/26/2022)  Financial Resource Strain: Low Risk  (09/21/2022)   Received from [facility]  Stress: No Stress Concern Present (09/21/2022)   Received from [facility]  Tobacco Use: Low Risk  (09/26/2022)  Health Literacy: Medium Risk (09/21/2022)   Received from [facility]    Readmission Risk Interventions     No data to display

## 2022-10-12 NOTE — Plan of Care (Signed)
  Problem: Education: Goal: Ability to describe self-care measures that may prevent or decrease complications (Diabetes Survival Skills Education) will improve Outcome: Progressing   Problem: Coping: Goal: Ability to adjust to condition or change in health will improve Outcome: Progressing   Problem: Metabolic: Goal: Ability to maintain appropriate glucose levels will improve Outcome: Progressing   

## 2022-10-13 DIAGNOSIS — C259 Malignant neoplasm of pancreas, unspecified: Secondary | ICD-10-CM | POA: Diagnosis not present

## 2022-10-13 DIAGNOSIS — Z515 Encounter for palliative care: Secondary | ICD-10-CM | POA: Diagnosis not present

## 2022-10-13 LAB — GLUCOSE, CAPILLARY
Glucose-Capillary: 125 mg/dL — ABNORMAL HIGH (ref 70–99)
Glucose-Capillary: 136 mg/dL — ABNORMAL HIGH (ref 70–99)
Glucose-Capillary: 147 mg/dL — ABNORMAL HIGH (ref 70–99)

## 2022-10-13 MED ORDER — SODIUM CHLORIDE 0.9 % IV SOLN: 12.5000 mg | Freq: Four times a day (QID) | INTRAVENOUS | Status: DC | PRN

## 2022-10-13 MED ORDER — GLYCOPYRROLATE 0.2 MG/ML IJ SOLN: 0.2000 mg | INTRAMUSCULAR | Status: DC | PRN

## 2022-10-13 MED ORDER — LEVALBUTEROL HCL 0.63 MG/3ML IN NEBU: 0.6300 mg | INHALATION_SOLUTION | Freq: Four times a day (QID) | RESPIRATORY_TRACT | Status: DC | PRN

## 2022-10-13 MED ORDER — MAGIC MOUTHWASH W/LIDOCAINE: 15.0000 mL | Freq: Four times a day (QID) | ORAL | Status: DC | PRN

## 2022-10-13 MED ORDER — ACETAMINOPHEN 325 MG PO TABS: 650.0000 mg | ORAL_TABLET | Freq: Four times a day (QID) | ORAL | Status: DC | PRN

## 2022-10-13 MED ORDER — TRAZODONE HCL 50 MG PO TABS: 25.0000 mg | ORAL_TABLET | Freq: Every evening | ORAL | Status: DC | PRN

## 2022-10-13 MED ORDER — OXYBUTYNIN CHLORIDE 5 MG PO TABS: 2.5000 mg | ORAL_TABLET | Freq: Four times a day (QID) | ORAL | Status: DC | PRN

## 2022-10-13 MED ORDER — ACETAMINOPHEN 500 MG PO TABS: 500.0000 mg | ORAL_TABLET | Freq: Every day | ORAL | Status: DC

## 2022-10-13 MED ORDER — NYSTATIN 100000 UNIT/GM EX POWD: Freq: Three times a day (TID) | CUTANEOUS | Status: DC | PRN

## 2022-10-13 MED ORDER — MORPHINE SULFATE (CONCENTRATE) 10 MG/0.5ML PO SOLN
5.0000 mg | ORAL | Status: DC | PRN
  Filled 2022-10-13: qty 0.5

## 2022-10-13 MED ORDER — BIOTENE DRY MOUTH MT LIQD: 15.0000 mL | OROMUCOSAL | Status: DC | PRN

## 2022-10-13 MED ORDER — POLYVINYL ALCOHOL 1.4 % OP SOLN: 1.0000 [drp] | Freq: Four times a day (QID) | OPHTHALMIC | Status: DC | PRN

## 2022-10-13 MED ORDER — HYDROMORPHONE HCL 1 MG/ML IJ SOLN
0.5000 mg | INTRAMUSCULAR | Status: DC | PRN
  Administered 2022-10-13: 0.5 mg via INTRAVENOUS
  Filled 2022-10-13: qty 0.5

## 2022-10-13 MED ORDER — LORAZEPAM 2 MG/ML PO CONC: 1.0000 mg | ORAL | Status: DC | PRN

## 2022-10-13 MED ORDER — MIRTAZAPINE 15 MG PO TBDP: 15.0000 mg | ORAL_TABLET | Freq: Every day | ORAL | Status: DC

## 2022-10-13 MED ORDER — ACETAMINOPHEN 650 MG RE SUPP: 650.0000 mg | Freq: Four times a day (QID) | RECTAL | Status: DC | PRN

## 2022-10-13 MED ORDER — LORAZEPAM 2 MG/ML IJ SOLN
1.0000 mg | INTRAMUSCULAR | Status: DC | PRN
  Administered 2022-10-13 (×2): 1 mg via INTRAVENOUS
  Filled 2022-10-13 (×2): qty 1

## 2022-10-13 MED ORDER — GLYCOPYRROLATE 1 MG PO TABS: 1.0000 mg | ORAL_TABLET | ORAL | Status: DC | PRN

## 2022-10-13 MED ORDER — MORPHINE SULFATE (CONCENTRATE) 10 MG/0.5ML PO SOLN
5.0000 mg | ORAL | Status: DC | PRN
  Administered 2022-10-13: 5 mg via SUBLINGUAL

## 2022-10-13 MED ORDER — DIPHENHYDRAMINE HCL 50 MG/ML IJ SOLN: 12.5000 mg | INTRAMUSCULAR | Status: DC | PRN

## 2022-10-13 MED ORDER — HYDROMORPHONE HCL 1 MG/ML IJ SOLN
0.5000 mg | INTRAMUSCULAR | Status: DC | PRN
  Administered 2022-10-13 (×2): 1 mg via INTRAVENOUS
  Filled 2022-10-13 (×2): qty 1

## 2022-10-13 MED ORDER — LORAZEPAM 1 MG PO TABS: 1.0000 mg | ORAL_TABLET | ORAL | Status: DC | PRN

## 2022-10-13 NOTE — Discharge Summary (Signed)
Physician Discharge Summary  ARVELL MALTEZ ZOX:[id number] DOB: 1945-04-05 DOA: 09/26/2022  PCP: Dondra Spry, MD  Admit date: 09/26/2022 Discharge date: 10/13/2022  Admitted From: Home Disposition:  Hospice home  Recommendations for Outpatient Follow-up:  Per hospice providers   Home Health: N/A Equipment/Devices: None  Discharge Condition: Hospice CODE STATUS: DNR Diet recommendation: Comfort  Brief/Interim Summary: 77 yo male who presented to [facility] ER on 07/13 with acute onset of severe abdominal/chest pain.  He was previously admitted at [facility] from 09/21/22-09/25/22 for abdominal pain along with unintentional weight loss.  During hospitalization at [facility] workup concerning for primary metastatic pancreatic adenocarcinoma.  He underwent ERCP/US with biopsy and stent placed on 09/23/22.  His pain was controlled post procedure, and pt discharged home on 09/25/22 (prescribed oxycodone and eliquis resumed).  Upon arrival to [facility] ER significant lab results were: Na+ 133/K+ 3.4/glucose 148/calcium 7.4/albumin 2.2/wbc 15.7.  CT Abd Pelvis concerning for moderate distension of the gallbladder but no findings of acute cholecystitis, however pt continued to c/o persistent abdominal pain.  EDP consulted with [facility] Oncologist Dr. Everardo All, and following review of imaging did not feel pt required transfer to [facility].  Pt subsequently admitted to [facility] medsurg unit per hospitalist team for additional workup and treatment due to continued abdominal pain.    Significant Hospital events :    07/13: Pt admitted to the medsurg unit with diffuse abdominal pain following recent ERCP with stent placement and newly diagnosed primary pancreatic adenocarcinoma with metastasis  [place] Abd Limited RUQ revealed Gallbladder sludge with mild gallbladder wall thickening and small volume pericholecystic fluid. Positive sonographic Murphy sign. Findings are concerning for acute acalculous cholecystitis. Liver metastases seen on CT  are not well identified by ultrasound.   07/14: General surgery consulted for acute acalculous cholecystitis and recommended decompression of the gallbladder via percutaneous cholecystostomy per IR and continue abx therapy.    07/15: Pt arrived to special recovery for cholecystostomy drain placement and pt noted to be in atrial fibrillation with rvr heart rate 140 to 170's.  Cardiology notified and the pt received 5 mg of iv cardizem x1 dose/250 ml NS bolus/20 mg of iv lasix.  Procedure canceled and pt transferred to the stepdown unit for cardizem gtt.  PCCM team consulted to assist with management.     09/29/22 patient in no distress, met with family today.  Awaiting percutaneous cholecystostomy and thoracentesis.  Testing for TLS today.   09/30/22 patient with emphysematous cholecystitis and pneumoperitoneum.  S/p throacentesis with partial clinical improvement.  Plan for IR consultation and cholecystostomy tube, completed by IR.     10/01/22 patient remains weak and chronically ill appearing.  He is s/p GB drain placement by IR and plan today to remove NGT and start PO nourishment.  He is being seen by palliative cancer specialist.   10/02/22 patient had BM overnight no longer with bowel obstruction does have NG still. Remains in AF on heparin, but vitals improved with low dose neo now. Palliative oncology following.  Patient wishes to have cancer therapy if possible. WBC count is normalized today.   10/03/22 : As patient was off vasopressors care was transitioned to hospitalist service.  No overnight events as per nursing.  Blood pressure remained stable off pressors with few soft readings.  Improvement in renal functions.  Nephrology following.  Patient tolerating full liquid diet.  NG can come out tomorrow.   10/04/2022: The patient was seen and examined at his bedside.  His wife was present in  the room.  Passed a swallow evaluation.  Okay to do soft diet.  Will repeat LFTs and lipase level.  Will  closely monitor to ensure tolerance to soft diet.   10/05/22:  Tolerating a soft diet well.  NG tube removed.  CT scan ab/p wo contrast ordered due to up-trending leukocytosis.  ID consulted to assist with the management.   10/06/22: The patient was seen and examined at his bedside.  His son was present in the room.  His leukocytosis continues to uptrend.  Started Merrem and DC'd the Zosyn today.  Appreciate infectious disease assistance.   7/24: No acute events overnight.  Oral intake remains poor.  Discussed with SLP and RD.  Plan to place image guided Dobbhoff and initiate tube feeds due to malnutrition   7/25: Transferred out of ICU.  Dobbhoff in place.  Tube feeds initiated.   7/29: Patient does not appear to be clinically improving.  Remains on tube feeds.  Unable to tolerate escalation past 30 cc/h.  Worsening hypoxia.  Palliative care reengaged for GOC discussion  7/30: Lengthy discussion at bedside this morning.  Explained to patient and wife that he is likely not a rehab candidate.  Subsequently after they notified the bedside RN that the patient would like to transition to full comfort measures and hospice referral.  Comfort measures were initiated while patient was in the hospital.  Hospice liaison engaged.  Patient accepted inpatient hospice services.  Discharged to hospice home.    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  Patient was discharged to hospice home.  DNR signed and on the  chart.  Discharge Instructions  Discharge Instructions     Diet - low sodium heart healthy   Complete by: As directed    Increase activity slowly   Complete by: As directed    No wound care   Complete by: As directed       Allergies as of 10/13/2022   No Known Allergies      Medication List     STOP taking these medications    amLODipine 10 MG tablet Commonly known as: NORVASC   atorvastatin 40 MG tablet Commonly known as: LIPITOR   blood glucose meter kit and supplies Kit   Eliquis 5 MG Tabs tablet Generic drug: apixaban   insulin glargine 100 UNIT/ML injection Commonly known as: LANTUS   lisinopril 5 MG tablet Commonly known as: ZESTRIL   oxyCODONE 5 MG immediate release tablet Commonly known as: Oxy IR/ROXICODONE   Pen Needles 3/16" 31G X 5 MM Misc       TAKE these medications    antiseptic oral rinse Liqd Apply 15 mLs topically as needed for dry mouth.   levalbuterol 0.63 MG/3ML nebulizer solution Commonly known as: XOPENEX Take 3 mLs (0.63 mg total) by nebulization every 6 (six) hours as needed for wheezing or shortness of breath.   magic mouthwash w/lidocaine Soln Take 15 mLs by mouth every 6 (six) hours as needed for mouth pain (mouth pain / discomfort).  No Known Allergies  Consultations: Palliative care Infectious disease Nephrology Cardiology PCCM   Procedures/Studies: DG Chest Port 1 View  Result Date: 10/12/2022 CLINICAL DATA:  Pleural effusion post thoracentesis EXAM: PORTABLE CHEST 1 VIEW COMPARISON:  10/10/2022, 09/29/2022, CT 10/10/2022 FINDINGS: Right sided subdiaphragmatic drainage catheter and right cholecystostomy tube are noted. Small moderate residual right pleural effusion, decreased compared to previous radiograph from 07/27. No convincing pneumothorax. Airspace disease at right base. Stable cardiomediastinal silhouette. Mild basilar atelectasis on the left. Esophageal tube tip below the diaphragm but  incompletely visualized IMPRESSION: Small to moderate residual right pleural effusion, decreased compared to previous radiograph from 07/27. No convincing pneumothorax. Persistent airspace disease at right base Electronically Signed   By: Jasmine Pang M.D.   On: 10/12/2022 20:42   IR EXCHANGE BILIARY DRAIN  Result Date: 10/12/2022 INDICATION: Malfunctioning cholecystostomy drainage catheter EXAM: Exchange and upsize of cholecystostomy drainage catheter using fluoroscopic guidance MEDICATIONS: Documented in the EMR ANESTHESIA/SEDATION: Local analgesia FLUOROSCOPY: Radiation Exposure Index (as provided by the fluoroscopic device): 1 minute (8 mGy) COMPLICATIONS: None immediate. PROCEDURE: Informed written consent was obtained from the patient after a thorough discussion of the procedural risks, benefits and alternatives. All questions were addressed. Maximal Sterile Barrier Technique was utilized including caps, mask, sterile gowns, sterile gloves, sterile drape, hand hygiene and skin antiseptic. A timeout was performed prior to the initiation of the procedure. The patient was placed supine on the exam table. The right upper quadrant was prepped and draped in the standard sterile fashion with inclusion of the existing percutaneous cholecystostomy tube within the sterile field. Injection of the existing cholecystostomy tube demonstrated appropriate location of the gallbladder lumen. However, given reports of for function, the decision was made to proceed with exchange and upsize of the cholecystostomy drainage tube. Locking loop was released, and over an Amplatz wire, the percutaneous tract was serially dilated to accommodate a 12 French locking drainage catheter. Locking loop was formed, and injection demonstrated appropriate location of the gallbladder lumen. Luminal irregularities compatible with history of gangrenous cholecystitis. The drainage catheter was then secured to the skin using silk suture and a  dressing. It was attached to bag drainage. The patient tolerated the procedure well without immediate complication. IMPRESSION: Successful exchange and upsize of percutaneous cholecystostomy tube for a new 12 French cholecystostomy drainage catheter. Drainage catheter placed to bag drainage. Electronically Signed   By: Olive Bass M.D.   On: 10/12/2022 16:16   IR THORACENTESIS ASP PLEURAL SPACE W/IMG GUIDE  Result Date: 10/12/2022 INDICATION: Right pleural effusion EXAM: ULTRASOUND GUIDED RIGHT THORACENTESIS MEDICATIONS: None. COMPLICATIONS: None immediate. PROCEDURE: An ultrasound guided thoracentesis was thoroughly discussed with the patient and questions answered. The benefits, risks, alternatives and complications were also discussed. The patient understands and wishes to proceed with the procedure. Written consent was obtained. Ultrasound was performed to localize and mark an adequate pocket of fluid in the right chest. The area was then prepped and draped in the normal sterile fashion. 1% Lidocaine was used for local anesthesia. Under ultrasound guidance a 19 gauge, 7-cm, Yueh catheter was introduced. Thoracentesis was performed. The catheter was removed and a dressing applied. FINDINGS: A total of approximately 800 mL of dark amber pleural fluid was removed. IMPRESSION: Successful ultrasound guided right thoracentesis yielding 800 mL of dark amber pleural fluid. Electronically Signed   By: Olive Bass M.D.   On: 10/12/2022 16:12   DG Chest Port 1 View  Result Date: 10/10/2022 CLINICAL DATA:  200808 Hypoxia 200808 EXAM: PORTABLE  CHEST 1 VIEW COMPARISON:  September 29, 2022, October 10, 2022 FINDINGS: The cardiomediastinal silhouette is unchanged in contour.Weighted tip feeding tube tip terminates over the distal stomach. Layering RIGHT pleural effusion, grossly similar in comparison to prior CT but increased since July 16. RIGHT-sided perihepatic drain. RIGHT-sided percutaneous cholecystostomy. No  pneumothorax. Hazy opacities throughout the RIGHT lung, likely atelectasis. LEFT basilar platelike opacity, likely atelectasis. IMPRESSION: Layering RIGHT pleural effusion, grossly similar in comparison to prior CT but increased since July 16. Electronically Signed   By: Meda Klinefelter M.D.   On: 10/10/2022 15:56   CT ABDOMEN PELVIS WO CONTRAST  Result Date: 10/10/2022 CLINICAL DATA:  Right upper quadrant abdominal pain. History of pancreatic mass. EXAM: CT ABDOMEN AND PELVIS WITHOUT CONTRAST TECHNIQUE: Multidetector CT imaging of the abdomen and pelvis was performed following the standard protocol without IV contrast. RADIATION DOSE REDUCTION: This exam was performed according to the departmental dose-optimization program which includes automated exposure control, adjustment of the mA and/or kV according to patient size and/or use of iterative reconstruction technique. COMPARISON:  October 05, 2022. FINDINGS: Lower chest: Bilateral pleural effusions are noted with adjacent atelectasis of both lower lobes, right greater than left. Hepatobiliary: Stable position of percutaneous drainage catheter seen into right subdiaphragmatic space. Stable position of cholecystostomy tube seen within gallbladder lumen. There remains gas within the wall the gallbladder suggesting emphysematous cholecystitis. Common bile duct stent is again noted. Multiple hepatic masses are again noted consistent with metastatic disease. Left hepatic pneumobilia is noted. Pancreas: There remains large mass involving pancreatic head and body consistent with malignancy which is not significantly changed compared with prior exam performed 5 days ago. Spleen: Multiple calcified splenic granulomas are noted. Adrenals/Urinary Tract: Adrenal glands and kidneys appear normal. No hydronephrosis or renal obstruction is noted. Urinary bladder is decompressed secondary to Foley catheter. Stomach/Bowel: Feeding tube is seen in proximal duodenum. Stomach is  otherwise unremarkable. There is no evidence of bowel obstruction or inflammation. The appendix appears normal. Sigmoid diverticulosis without inflammation. Vascular/Lymphatic: Aortic atherosclerosis. Retroperitoneal adenopathy is again noted and stable, including 2 cm retrocaval lymph node. Reproductive: Prostate is unremarkable. Other: Moderate anasarca is noted. Mild ascites is noted, including fluid around the liver and spleen. Mild to moderate size bilateral inguinal hernias are noted which contain fat and fluid. Musculoskeletal: No acute or significant osseous findings. IMPRESSION: Bilateral pleural effusions are noted with associated atelectasis of both lower lobes, right greater than left. Stable position of percutaneous cholecystostomy tube. Stable findings consistent with emphysematous cholecystitis. Stable position of percutaneous drainage catheter into right subdiaphragmatic space. Stable large mass involving pancreatic head and body with stent noted in common bile duct. Multiple hepatic metastatic lesions are again noted and stable. Moderate anasarca is noted.  Mild ascites is noted. Stable retroperitoneal adenopathy consistent metastatic disease. Electronically Signed   By: Lupita Raider M.D.   On: 10/10/2022 13:49   DG Abd 1 View  Result Date: 10/09/2022 CLINICAL DATA:  Abdominal pain.  History of pancreatic cancer. EXAM: ABDOMEN - 1 VIEW COMPARISON:  CT 10/05/2022 FINDINGS: Tip of the weighted enteric tube in the right upper quadrant in the region of the distal stomach. There is gaseous gastric distension. Tube biliary drains and a biliary stent are in place. Small to moderate volume of stool in the colon. Increased air throughout mildly dilated small bowel in the central abdomen. Pelvic calcifications consistent with phleboliths. Vascular calcifications are seen. IMPRESSION: 1. Tip of the weighted enteric tube in the right upper quadrant in  the region of the distal stomach. Gaseous gastric  distension. 2. Increased air throughout mildly dilated small bowel in the central abdomen, may be ileus or enteritis. Electronically Signed   By: Narda Rutherford M.D.   On: 10/09/2022 13:09   DG Basil Dess Tube Plc W/Fl W/Rad  Result Date: 10/07/2022 CLINICAL DATA:  Provided history: Dysphagia. Request received for NG tube placement under fluoroscopy. EXAM: NASO G TUBE PLACEMENT WITH FL AND WITH RAD CONTRAST:  None FLUOROSCOPY: Radiation Exposure Index (if provided by the fluoroscopic device): 6.30 mGy air kerma COMPARISON:  CT abdomen/pelvis 10/05/2022. FINDINGS: The tip of the enteric tube was lubricated and gently inserted into the right nostril. Under fluoroscopic guidance, the tube easily advanced into the esophagus and subsequently into the stomach. The tip of the nasogastric tube reach the gastric antrum. The procedure was performed by Alwyn Ren NP, supervised by Dr. Jackey Loge. IMPRESSION: Technically successful fluoroscopically-guided placement of a 10 French nasogastric feeding tube. The tip of the tube is located within the gastric antrum. Electronically Signed   By: Jackey Loge D.O.   On: 10/07/2022 16:05   CT ABDOMEN PELVIS WO CONTRAST  Result Date: 10/05/2022 CLINICAL DATA:  Ascites. Recent diagnosis of pancreatic cancer and biliary obstruction status post common bile duct stent placement. A facet [place]/structured acute cholecystitis. EXAM: CT ABDOMEN AND PELVIS WITHOUT CONTRAST TECHNIQUE: Multidetector CT imaging of the abdomen and pelvis was performed following the standard protocol without IV contrast. RADIATION DOSE REDUCTION: This exam was performed according to the departmental dose-optimization program which includes automated exposure control, adjustment of the mA and/or kV according to patient size and/or use of iterative reconstruction technique. COMPARISON:  CT abdomen pelvis dated 09/29/2022. FINDINGS: Evaluation of this exam is limited in the absence of intravenous contrast.  Lower chest: Partially visualized small bilateral pleural effusions and consolidative changes of the majority of the visualized lower lobes with air bronchograms which may represent atelectasis or pneumonia. A 5 mm nodule in the lingula as well as a 6 mm left lower lobe nodule. Small pockets of pneumoperitoneum in the upper abdomen likely introduced via drainage catheter placement. There is a small ascites. Hepatobiliary: Hypodense mass involving the caudate lobe of the liver measure up to 7.4 cm in keeping with metastatic disease. Additional ill-defined hypodense lesions within the liver also consistent with metastatic disease. There is mild dilatation and pneumobilia. The gallbladder is poorly visualized. There is air in the gallbladder in keeping with emphysematous cholecystitis. A percutaneous cholecystostomy with pigtail tip in the body of the gallbladder noted. There is debris within the gallbladder. Pancreas: Ill-defined mass at the head of the pancreas measuring 6.5 x 7.3 cm in keeping with known malignancy. This mass appears to encase the common bile duct. A common bowel duct stent is noted. There is air in the proximal lumen of the stent. There is debris or infiltrative mass in the distal lumen of the stent. Spleen: Normal in size without focal abnormality. Adrenals/Urinary Tract: The adrenal glands are unremarkable. Mild bilateral hydronephrosis versus parapelvic cysts. No renal calculi noted. The visualized ureters appear unremarkable. The urinary bladder is decompressed around a Foley catheter. Stomach/Bowel: There is sigmoid diverticulosis without active inflammatory changes. There is no bowel obstruction. The appendix is suboptimally visualized but grossly unremarkable. Vascular/Lymphatic: Mild aortoiliac atherosclerotic disease. The IVC is unremarkable. No portal venous gas. Retroperitoneal adenopathy including a 2 cm retrocaval lymph node. Reproductive: The prostate and seminal vesicles are grossly  unremarkable. Other: Small fat containing bilateral inguinal hernia.  There is diffuse subcutaneous edema. Musculoskeletal: No acute osseous pathology. IMPRESSION: 1. 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 2. Pancreatic head mass in keeping with known malignancy. 3. Hepatic metastatic disease. 4. Retroperitoneal adenopathy. 5. Small ascites. 6. Small bilateral pleural effusions and bibasilar atelectasis or infiltrate. 7. Aortic Atherosclerosis (ICD10-I70.0) and Emphysema (ICD10-J43.9). Electronically Signed   By: Elgie Collard M.D.   On: 10/05/2022 19:23   DG Abd 1 View  Result Date: 09/30/2022 CLINICAL DATA:  NG tube placement EXAM: ABDOMEN - 1 VIEW COMPARISON:  CT abdomen/pelvis 1 day prior FINDINGS: The enteric catheter tip and sidehole are in the stomach. The stomach remains significantly distended. Air in the gallbladder lumen and wall is noted consistent with the findings of emphysematous cholecystitis seen on CT from 1 day prior. The free intraperitoneal air seen on that study is not definitely seen on this study. There is gaseous distention of the bowel in the imaged abdomen. A biliary stent is noted. IMPRESSION: 1. Enteric catheter tip and sidehole in the stomach which remains distended. 2. Air in the gallbladder lumen and wall consistent with the findings of emphysematous cholecystitis on the CT study from 1 day prior. The pneumoperitoneum seen on that study is not appreciated on the current study. Electronically Signed   By: Lesia Hausen M.D.   On: 09/30/2022 13:28   CT PERC CHOLECYSTOSTOMY  Result Date: 09/30/2022 INDICATION: 77 year old male with history recently diagnosed advanced stage pancreatic cancer with biliary obstruction status post covered common bile duct stent placement at outside facility approximally 1 week ago, now with emphysematous/ruptured acute cholecystitis with associated perihepatic fluid collection. EXAM: CT PERC DRAIN PERITONEAL ABCESS; CT  PERCUTANEOUS CHOLECYSTOSTOMY COMPARISON:  None Available. MEDICATIONS: The patient is currently admitted to the hospital and receiving intravenous antibiotics. The antibiotics were administered within an appropriate time frame prior to the initiation of the procedure. ANESTHESIA/SEDATION: Moderate (conscious) sedation was employed during this procedure. A total of Versed 0.5 mg and Fentanyl 25 mcg was administered intravenously. Moderate Sedation Time: 16 minutes. The patient's level of consciousness and vital signs were monitored continuously by radiology nursing throughout the procedure under my direct supervision. CONTRAST:  None COMPLICATIONS: None immediate. PROCEDURE: RADIATION DOSE REDUCTION: This exam was performed according to the departmental dose-optimization program which includes automated exposure control, adjustment of the mA and/or kV according to patient size and/or use of iterative reconstruction technique. Informed written consent was obtained from the patient after a discussion of the risks, benefits and alternatives to treatment. The patient was placed supine on the CT gantry and a pre procedural CT was performed re-demonstrating the known abscess/fluid collection within the gallbladder and perihepatic region. The procedure was planned. A timeout was performed prior to the initiation of the procedure. The right upper quadrant was prepped and draped in the usual sterile fashion. The overlying soft tissues were anesthetized with 1% lidocaine with epinephrine. Appropriate trajectories were planned with the use of a 22 gauge spinal needle. Two separate 18 gauge trocar needles were advanced into the gallbladder as well as into the perihepatic fluid collection and short Amplatz super stiff wires were coiled within the gallbladder and perihepatic fluid collection. Appropriate positioning was confirmed with a limited CT scan. The tracks were serially dilated allowing placement of 2, 10 French  all-purpose drainage catheters. Appropriate positioning was confirmed with a limited postprocedural CT scan. Approximately 100 ml of bilious and purulent fluid was aspirated from the gallbladder in a proximally 500 mL of similar appearing, bilious and purulent  fluid was aspirated from the perihepatic fluid collection. The drains were connected to a bulb suction and sutured in place. Dressings were placed. The patient tolerated the procedure well without immediate post procedural complication. IMPRESSION: 1. Successful CT guided placement of a 10 French percutaneous cholecystostomy drain with aspiration of 100 mL of bilious and purulent fluid. Samples were sent to the laboratory as requested by the ordering clinical team. 2. Successful CT guided placement of a 10 French right upper quadrant perihepatic fluid collection drain with aspiration of 500 mL of bilious and purulent fluid. Marliss Coots, MD [facility] Radiology Electronically Signed   By: Marliss Coots M.D.   On: 09/30/2022 13:19   CT GUIDED PERITONEAL/RETROPERITONEAL FLUID DRAIN BY PERC CATH  Result Date: 09/30/2022 INDICATION: 77 year old male with history recently diagnosed advanced stage pancreatic cancer with biliary obstruction status post covered common bile duct stent placement at outside facility approximally 1 week ago, now with emphysematous/ruptured acute cholecystitis with associated perihepatic fluid collection. EXAM: CT PERC DRAIN PERITONEAL ABCESS; CT PERCUTANEOUS CHOLECYSTOSTOMY COMPARISON:  None Available. MEDICATIONS: The patient is currently admitted to the hospital and receiving intravenous antibiotics. The antibiotics were administered within an appropriate time frame prior to the initiation of the procedure. ANESTHESIA/SEDATION: Moderate (conscious) sedation was employed during this procedure. A total of Versed 0.5 mg and Fentanyl 25 mcg was administered intravenously. Moderate  Sedation Time: 16 minutes. The patient's level of consciousness and vital signs were monitored continuously by radiology nursing throughout the procedure under my direct supervision. CONTRAST:  None COMPLICATIONS: None immediate. PROCEDURE: RADIATION DOSE REDUCTION: This exam was performed according to the departmental dose-optimization program which includes automated exposure control, adjustment of the mA and/or kV according to patient size and/or use of iterative reconstruction technique. Informed written consent was obtained from the patient after a discussion of the risks, benefits and alternatives to treatment. The patient was placed supine on the CT gantry and a pre procedural CT was performed re-demonstrating the known abscess/fluid collection within the gallbladder and perihepatic region. The procedure was planned. A timeout was performed prior to the initiation of the procedure. The right upper quadrant was prepped and draped in the usual sterile fashion. The overlying soft tissues were anesthetized with 1% lidocaine with epinephrine. Appropriate trajectories were planned with the use of a 22 gauge spinal needle. Two separate 18 gauge trocar needles were advanced into the gallbladder as well as into the perihepatic fluid collection and short Amplatz super stiff wires were coiled within the gallbladder and perihepatic fluid collection. Appropriate positioning was confirmed with a limited CT scan. The tracks were serially dilated allowing placement of 2, 10 French all-purpose drainage catheters. Appropriate positioning was confirmed with a limited postprocedural CT scan. Approximately 100 ml of bilious and purulent fluid was aspirated from the gallbladder in a proximally 500 mL of similar appearing, bilious and purulent fluid was aspirated from the perihepatic fluid collection. The drains were connected to a bulb suction and sutured in place. Dressings were placed. The patient tolerated the procedure well  without immediate post procedural complication. IMPRESSION: 1. Successful CT guided placement of a 10 French percutaneous cholecystostomy drain with aspiration of 100 mL of bilious and purulent fluid. Samples were sent to the laboratory as requested by the ordering clinical team. 2. Successful CT guided placement of a 10 French right upper quadrant perihepatic fluid collection drain with aspiration of 500 mL of bilious and purulent fluid. Marliss Coots, MD [facility]  Radiology Electronically Signed   By: Marliss Coots M.D.   On: 09/30/2022 13:19   CT ABDOMEN PELVIS WO CONTRAST  Addendum Date: 09/29/2022   ADDENDUM REPORT: 09/29/2022 23:18 ADDENDUM: These results were called by telephone at the time of interpretation on 09/29/2022 at 11:14 pm to provider Dr. Everlene Farrier, who verbally acknowledged these results. Electronically Signed   By: Tish Frederickson M.D.   On: 09/29/2022 23:18   Result Date: 09/29/2022 CLINICAL DATA:  Bowel obstruction suspected EXAM: CT ABDOMEN AND PELVIS WITHOUT CONTRAST TECHNIQUE: Multidetector CT imaging of the abdomen and pelvis was performed following the standard protocol without IV contrast. RADIATION DOSE REDUCTION: This exam was performed according to the departmental dose-optimization program which includes automated exposure control, adjustment of the mA and/or kV according to patient size and/or use of iterative reconstruction technique. COMPARISON:  CT abdomen pelvis 09/26/2022 FINDINGS: Lower chest: Partially visualized bilateral small pleural effusions with bilateral lower lobe passive atelectasis. Hepatobiliary: Redemonstration of a couple of hepatic lesions consistent with known metastases. Air-fluid level within the gastric lumen. Air noted within the wall of the gallbladder. Common bile duct stent in appropriate position with associated pneumobilia. Pancreas: Proximal pancreatic mass again noted in difficult to delineate on this  study. Finding measures approximately 12.6 x 4.7 cm extending along the liver and duodenum. No surrounding inflammatory changes. No main pancreatic ductal dilatation. Spleen: Normal in size without focal abnormality. Adrenals/Urinary Tract: No adrenal nodule bilaterally. No nephrolithiasis and no hydronephrosis. Several fluid density lesions likely represent simple renal cysts. Simple renal cysts, in the absence of clinically indicated signs/symptoms, require no independent follow-up. No ureterolithiasis or hydroureter. The urinary bladder is decompressed with Foley catheter tip and balloon terminating within the lumen. Stomach/Bowel: Stomach distended with air-fluid level. Bowel thickening of the first portion of the duodenum suggestive of possible metastatic invasion. No evidence of large bowel wall thickening or dilatation. Colonic diverticulosis. Appendix appears normal. Vascular/Lymphatic: No abdominal aorta or iliac aneurysm. Moderate atherosclerotic plaque of the aorta and its branches. No abdominal, pelvic, or inguinal lymphadenopathy. Reproductive: Prostate is unremarkable. Other: Interval increase in trace to small volume simple free fluid within the right upper quadrant. Associated free gas. No intraperitoneal free gas. No organized fluid collection. Musculoskeletal: Bilateral inguinal hernias containing fat and fluid. Subcutaneus soft tissue edema. No suspicious lytic or blastic osseous lesions. No acute displaced fracture. Multilevel degenerative changes of the spine. IMPRESSION: 1. Acute emphysematous cholecystitis. Recommend emergent surgical consultation. 2. Interval development of pneumoperitoneum. 3. Interval increase in small volume perihepatic ascites. 4. Gastric lumen distended with air-fluid level. Developing obstruction along the first portion of duodenum in the region of the pancreatic malignancy not excluded. 5. Metastatic pancreatic malignancy poorly evaluated on this noncontrast study.  Please see CT abdomen pelvis 09/26/2022 for further details. 6. Partially visualized bilateral small pleural effusions with bilateral lower lobe passive atelectasis. 7.  Aortic Atherosclerosis (ICD10-I70.0). These results were called by telephone at the time of interpretation on 09/29/2022 at 10:37 pm to provider Dr. Karna Christmas, who verbally acknowledged these results. Electronically Signed: By: Tish Frederickson M.D. On: 09/29/2022 22:44   US THORACENTESIS ASP PLEURAL SPACE W/IMG GUIDE  Result Date: 09/29/2022 INDICATION: Right pleural effusion.  Pancreatic cancer. EXAM: ULTRASOUND GUIDED RIGHT THORACENTESIS MEDICATIONS: None. COMPLICATIONS: None immediate. PROCEDURE: An ultrasound guided thoracentesis was thoroughly discussed with the patient and questions answered. The benefits, risks, alternatives and complications were also discussed. The patient understands and wishes to proceed with the procedure. Written consent was obtained. Ultrasound was performed  to localize and mark an adequate pocket of fluid in the right chest. The area was then prepped and draped in the normal sterile fashion. 1% Lidocaine was used for local anesthesia. Under ultrasound guidance a 6 Fr Safe-T-Centesis catheter was introduced. Thoracentesis was performed. The catheter was removed and a dressing applied. FINDINGS: A total of approximately 800 mL of dark yellow fluid was removed. Samples were sent to the laboratory as requested by the clinical team. Images were also obtained of the right upper abdomen to evaluate the gallbladder. Loculated perihepatic ascites is identified. Previously identified gallbladder is poorly characterized because there appears to be air within the gallbladder. The amount of fluid around the liver has significantly increased since the right upper quadrant ultrasound 06/27/2022. IMPRESSION: 1. Successful ultrasound guided right thoracentesis yielding 800 mL of pleural fluid. 2. Images of the right upper quadrant  demonstrate markedly increased perihepatic ascites. Ascites appears to be complex. In addition, the gallbladder now appears to contain a large amount of gas which is likely related to the biliary stent and pneumobilia. Electronically Signed   By: Richarda Overlie M.D.   On: 09/29/2022 15:52   DG Chest Port 1 View  Result Date: 09/29/2022 CLINICAL DATA:  Pleural effusion.  Status post thoracentesis. EXAM: PORTABLE CHEST 1 VIEW COMPARISON:  Chest radiographs 09/28/2022 and 09/26/2022. Abdominal CT 09/26/2022. FINDINGS: 1417 hours. Interval decreased volume of right pleural effusion. A left pleural effusion and left basilar airspace disease appear unchanged. No pneumothorax. The heart size and mediastinal contours are stable. Moderate gaseous distension of the stomach noted. Curvilinear lucency in the right upper quadrant corresponds with air in the gallbladder lumen on recent CT. Patient has a biliary stent. IMPRESSION: 1. Decreased right pleural effusion following thoracentesis. No pneumothorax. 2. No significant change in left pleural effusion and left basilar airspace disease. Electronically Signed   By: Carey Bullocks M.D.   On: 09/29/2022 15:46   ECHOCARDIOGRAM COMPLETE  Result Date: 09/29/2022    ECHOCARDIOGRAM REPORT   Patient Name:   SUNNIE WALKO Date of Exam: 09/29/2022 Medical Rec #:  [id number]       Height:       72.0 in Accession #:    [id number]      Weight:       163.0 lb Date of Birth:  October 02, 1945       BSA:          1.953 m Patient Age:    76 years        BP:           112/74 mmHg Patient Gender: M               HR:           115 bpm. Exam Location:  [facility] Procedure: 2D Echo, Cardiac Doppler and Color Doppler Indications:     Atrial Fibrillation I48.91  History:         Patient has no prior history of Echocardiogram examinations.                  Arrythmias:Atrial Fibrillation; Risk Factors:Diabetes and                  Hypertension.  Sonographer:     Cristela Blue Referring Phys:  [id number] SONA PATEL  Diagnosing Phys: Yvonne Kendall MD IMPRESSIONS  1. Left ventricular ejection fraction, by estimation, is 60 to 65%. The left ventricle has normal function. The left ventricle has no regional wall motion abnormalities.  There is mild asymmetric left ventricular hypertrophy of the basal-septal segment. Left ventricular diastolic function could not be evaluated.  2. Pulmonary artery pressure is mildly elevated (RVSP 30-35 mmHg plus central venous/right atrial pressure). Right ventricular systolic function is normal. The right ventricular size is normal.  3. Moderate pleural effusion in the right lateral region.  4. The mitral valve is normal in structure. Mild to moderate mitral valve regurgitation.  5. Tricuspid valve regurgitation is mild to moderate.  6. The aortic valve is tricuspid. Aortic valve regurgitation is mild. No aortic stenosis is present. FINDINGS  Left Ventricle: Left ventricular ejection fraction, by estimation, is 60 to 65%. The left ventricle has normal function. The left ventricle has no regional wall motion abnormalities. The left ventricular internal cavity size was normal in size. There is  mild asymmetric left ventricular hypertrophy of the basal-septal segment. Left ventricular diastolic function could not be evaluated due to atrial fibrillation. Left ventricular diastolic function could not be evaluated. Right Ventricle: Pulmonary artery pressure is mildly elevated (RVSP 30-35 mmHg plus central venous/right atrial pressure). The right ventricular size is normal. No increase in right ventricular wall thickness. Right ventricular systolic function is normal. Left Atrium: Left atrial size was normal in size. Right Atrium: Right atrial size was normal in size. Pericardium: The pericardium was not well visualized. Mitral Valve: The mitral valve is normal in structure. Mild to moderate mitral valve regurgitation. Tricuspid Valve: The tricuspid valve is normal in structure. Tricuspid valve  regurgitation is mild to moderate. Aortic Valve: The aortic valve is tricuspid. Aortic valve regurgitation is mild. No aortic stenosis is present. Aortic valve mean gradient measures 3.0 mmHg. Aortic valve peak gradient measures 4.7 mmHg. Aortic valve area, by VTI measures 3.27 cm. Pulmonic Valve: The pulmonic valve was grossly normal. Pulmonic valve regurgitation is trivial. No evidence of pulmonic stenosis. Aorta: The aortic root is normal in size and structure. Pulmonary Artery: The pulmonary artery is not well seen. Venous: The inferior vena cava was not well visualized. IAS/Shunts: The interatrial septum was not well visualized. Additional Comments: There is a moderate pleural effusion in the right lateral region.  LEFT VENTRICLE PLAX 2D LVIDd:         3.90 cm LVIDs:         2.60 cm LV PW:         0.90 cm LV IVS:        1.20 cm LVOT diam:     2.20 cm LV SV:         47 LV SV Index:   24 LVOT Area:     3.80 cm  RIGHT VENTRICLE RV Basal diam:  3.50 cm RV Mid diam:    2.80 cm LEFT ATRIUM             Index        RIGHT ATRIUM           Index LA diam:        4.30 cm 2.20 cm/m   RA Area:     16.20 cm LA Vol (A2C):   70.1 ml 35.89 ml/m  RA Volume:   34.00 ml  17.41 ml/m LA Vol (A4C):   47.7 ml 24.42 ml/m LA Biplane Vol: 60.7 ml 31.08 ml/m  AORTIC VALVE AV Area (Vmax):    2.81 cm AV Area (Vmean):   2.73 cm AV Area (VTI):     3.27 cm AV Vmax:           108.00  cm/s AV Vmean:          74.350 cm/s AV VTI:            0.143 m AV Peak Grad:      4.7 mmHg AV Mean Grad:      3.0 mmHg LVOT Vmax:         79.80 cm/s LVOT Vmean:        53.400 cm/s LVOT VTI:          0.123 m LVOT/AV VTI ratio: 0.86  AORTA Ao Root diam: 3.66 cm MITRAL VALVE               TRICUSPID VALVE MV Area (PHT): 3.63 cm    TR Peak grad:   32.9 mmHg MV Decel Time: 209 msec    TR Vmax:        287.00 cm/s MV E velocity: 86.50 cm/s                            SHUNTS                            Systemic VTI:  0.12 m                            Systemic Diam:  2.20 cm Yvonne Kendall MD Electronically signed by Yvonne Kendall MD Signature Date/Time: 09/29/2022/2:31:41 PM    Final    US RENAL  Result Date: 09/29/2022 CLINICAL DATA:  Acute renal failure. EXAM: RENAL / URINARY TRACT ULTRASOUND COMPLETE COMPARISON:  September 26, 2022. FINDINGS: Right Kidney: Renal measurements: 9.6 x 5.1 x 4.7 cm = volume: 122 mL. At least 2 parapelvic cysts are noted, the largest measuring 2.3 cm. Increased echogenicity of renal parenchyma is noted. No mass or hydronephrosis visualized. Left Kidney: Renal measurements: 10.1 x 5.9 x 5.5 cm = volume: 170 mL. Multi septated cyst measuring 2.3 x 2.3 x 1.9 cm is noted. 2.5 cm simple cyst is noted. Increased echogenicity of renal parenchyma is noted. No hydronephrosis visualized. Bladder: Appears normal for degree of bladder distention. Other: None. IMPRESSION: 2.3 cm multi-septated complex cyst seen in left kidney. Follow-up ultrasound in 6-12 months is recommended to ensure stability. Increased echogenicity of renal parenchyma is noted bilaterally suggesting medical renal disease. No hydronephrosis or renal obstruction is noted. Electronically Signed   By: Lupita Raider M.D.   On: 09/29/2022 13:28   DG Chest Port 1 View  Result Date: 09/28/2022 CLINICAL DATA:  Shortness of breath. EXAM: PORTABLE CHEST 1 VIEW COMPARISON:  September 26, 2022 FINDINGS: Calcific atherosclerotic disease of the aorta. Cardiomediastinal silhouette is enlarged. Mediastinal contours appear intact. Bilateral pleural effusions, right greater than left. No focal airspace consolidation. Osseous structures are without acute abnormality. Soft tissues are grossly normal. IMPRESSION: 1. Bilateral pleural effusions, right greater than left. 2. Enlarged cardiomediastinal silhouette. Electronically Signed   By: Ted Mcalpine M.D.   On: 09/28/2022 15:37   US Abdomen Limited RUQ (LIVER/GB)  Result Date: 09/26/2022 CLINICAL DATA:  Abdominal pain. History of metastatic  pancreatic cancer. EXAM: ULTRASOUND ABDOMEN LIMITED RIGHT UPPER QUADRANT COMPARISON:  CT abdomen pelvis from same day. FINDINGS: Gallbladder: No gallstones. Sludge with mild gallbladder wall thickening and small volume pericholecystic fluid. Positive sonographic Murphy sign noted by sonographer. Common bile duct: Diameter: 4 mm, normal.  Common bile duct stent noted. Liver: Liver lesions  seen on CT are not well identified by ultrasound. Within normal limits in parenchymal echogenicity. Portal vein is patent on color Doppler imaging with normal direction of blood flow towards the liver. Other: None. IMPRESSION: 1. Gallbladder sludge with mild gallbladder wall thickening and small volume pericholecystic fluid. Positive sonographic Murphy sign. Findings are concerning for acute acalculous cholecystitis. 2. Liver metastases seen on CT are not well identified by ultrasound. Electronically Signed   By: Obie Dredge M.D.   On: 09/26/2022 14:56   CT ABDOMEN PELVIS W CONTRAST  Result Date: 09/26/2022 CLINICAL DATA:  Central chest pain. Abdominal pain. History of pancreatic cancer. EXAM: CT ABDOMEN AND PELVIS WITH CONTRAST TECHNIQUE: Multidetector CT imaging of the abdomen and pelvis was performed using the standard protocol following bolus administration of intravenous contrast. RADIATION DOSE REDUCTION: This exam was performed according to the departmental dose-optimization program which includes automated exposure control, adjustment of the mA and/or kV according to patient size and/or use of iterative reconstruction technique. CONTRAST:  80mL OMNIPAQUE IOHEXOL 300 MG/ML  SOLN COMPARISON:  Chest radiograph 09/26/2022. Outside CTs from [facility] dated 09/21/2022 and 09/22/2022. Outside images are unavailable but reports are available. FINDINGS: Lower chest: Multiple small pulmonary nodules in the visualized lungs. Index pulmonary nodule is in the right lower lobe along the right major fissure on image 33/4 measuring up to  1.3 cm. There also may be a larger nodule in the left lower lobe measuring 1.4 cm image 15/4. Volume loss and compressive atelectasis in the left lower lobe. Compressive atelectasis in the right lower lobe. Bilateral small pleural effusions. Hepatobiliary: Multiple ill-defined hypoechoic liver lesions compatible with metastatic disease. Index lesion in the left hepatic lobe measuring 2.1 cm on image 23/2. There is gas and contrast within the gallbladder. The gallbladder is moderately distended and findings compatible with recent ERCP. Metallic biliary stent in the common bile duct. Narrowing in the biliary stent associated with the pancreatic mass. Small amount of pneumobilia. Small amount of fluid and stranding around the liver and gallbladder. Pancreas: Large poorly defined pancreatic head mass with multiple hypoechoic components. Difficult to accurately measure the mass itself but it roughly measures 4.0 x 4.9 x 5.1 cm. Dilatation of the main pancreatic duct. The mass extends cephalad and appears to be involving the caudate lobe. There is a caudate lesion measuring up to 4.1 cm. In addition, there is hypoechoic implant or fluid collection cephalad to the caudate lobe involving the hepatic subcapsular space. This subcapsular collection or implant measures 7.8 x 4.6 x 5.6 cm. This was present on the previous exam based on the prior report. This caudate/subcapsular component causing mass effect on the adjacent IVC. Mild stranding and edema throughout the porta hepatis region. Spleen: Small splenic calcifications.  Spleen is normal for size. Adrenals/Urinary Tract: Normal adrenal glands. Bilateral cortical and parapelvic renal cysts. No suspicious renal lesions. No hydronephrosis. Bladder is decompressed. There is probably a small bladder diverticulum along the posterior aspect of the bladder. Cannot exclude mild bladder wall thickening. Asymmetric left perinephric stranding. Stomach/Bowel: Normal appearance of the  stomach. No bowel dilatation. Colonic diverticula involving the sigmoid colon. No evidence for acute bowel inflammation. Vascular/Lymphatic: Atherosclerotic disease involving the abdominal aorta without aneurysm. Atherosclerotic calcifications at the origin of the SMA. Atherosclerotic calcifications at the origin of the bilateral renal arteries. IVC is patent but there is compression in the suprarenal IVC due to the subcapsular fluid collection / mass. 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 Main portal vein is small  but patent. Intrahepatic portal veins are patent. 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 Splenic vein is patent. Pathologic lymphadenopathy in the retrocaval space measuring up to 1.7 cm on image 39/2. Reproductive: Prostate contains calcifications, asymmetric towards the left. Other: Small amount of free fluid in the abdomen and pelvis. Musculoskeletal: Evidence for an old right tenth rib fracture. Bilateral inguinal hernias containing fat. Small amount of fluid in the left inguinal hernia. No suspicious osseous lesion. IMPRESSION: 1. Large pancreatic head mass compatible with the patient's history of pancreatic cancer. The mass extends cephalad and involves the caudate lobe. There is also a subcapsular fluid collection or implant cephalad to the caudate lobe causing mass effect on the adjacent IVC. 2. Multiple liver lesions, abdominal lymphadenopathy and multiple pulmonary nodules. Findings compatible with metastatic disease. 3. Metallic biliary stent in the common bile duct and there is a small amount of pneumobilia. No significant intrahepatic biliary dilatation. In addition, there is moderate distention of the gallbladder containing iodinated contrast and small amount of gas. Findings compatible with recent ERCP. Gallbladder distension could be a source of abdominal pain. 4.  Bilateral small pleural effusions with compressive atelectasis in the lower lobes. 5. Small amount of free fluid in the abdomen and pelvis. 6. Colonic diverticulosis without evidence for acute diverticulitis. 7. Aortic Atherosclerosis (ICD10-I70.0). 8. 9 mm splenic artery aneurysm. Electronically Signed   By: Richarda Overlie M.D.   On: 09/26/2022 09:50   DG Chest 2 View  Result Date: 09/26/2022 CLINICAL DATA:  77 year old male with history of central chest pain. EXAM: CHEST - 2 VIEW COMPARISON:  Chest x-ray 07/28/2014. FINDINGS: Lung volumes are low. No consolidative airspace disease. Trace bilateral pleural effusions with bibasilar opacities that are favored to reflect subsegmental atelectasis. No pneumothorax. No evidence of pulmonary edema. No definite suspicious appearing pulmonary nodules or masses are noted. Heart size is normal. Upper mediastinal contours are within normal limits. Atherosclerotic calcifications in the thoracic aorta. IMPRESSION: 1. Low lung volumes with trace bilateral pleural effusions and bibasilar opacities favored to reflect areas of subsegmental atelectasis. 2. Aortic atherosclerosis. Electronically Signed   By: Trudie Reed M.D.   On: 09/26/2022 06:19      Subjective: Seen and examined the day of discharge.  Appears weak and fatigued.  Appropriate for disposition to inpatient hospice.  Discharge Exam: Vitals:   10/12/22 2350 10/13/22 0414  BP: (!) 87/50 (!) 88/64  Pulse: 91 82  Resp: 20 20  Temp: 98 F (36.7 C) (!) 97.5 F (36.4 C)  SpO2: 96%    Vitals:   10/12/22 1621 10/12/22 2037 10/12/22 2350 10/13/22 0414  BP: 109/62 94/61 (!) 87/50 (!) 88/64  Pulse: 86 (!) 53 91 82  Resp: 18 20 20 20   Temp: 97.7 F (36.5 C) 98.5 F (36.9 C) 98 F (36.7 C) (!) 97.5 F (36.4 C)  TempSrc:      SpO2: 100% (!) 73% 96%   Weight:      Height:         The results of significant diagnostics from this hospitalization (including imaging, microbiology, ancillary and  laboratory) are listed below for reference.     Microbiology: Recent Results (from the past 240 hour(s))  Culture, blood (Routine X 2) w Reflex to ID Panel     Status: None   Collection Time: 10/05/22  6:31 AM   Specimen: BLOOD LEFT HAND  Result  Value Ref Range Status   Specimen Description BLOOD LEFT HAND  Final   Special Requests   Final    BOTTLES DRAWN AEROBIC AND ANAEROBIC Blood Culture results may not be optimal due to an inadequate volume of blood received in culture bottles   Culture   Final    NO GROWTH 5 DAYS Performed at [facility], [address] [place], [place] [address]    Report Status 10/10/2022 FINAL  Final  Culture, blood (Routine X 2) w Reflex to ID Panel     Status: None   Collection Time: 10/05/22  6:31 AM   Specimen: BLOOD RIGHT HAND  Result Value Ref Range Status   Specimen Description BLOOD RIGHT HAND  Final   Special Requests   Final    BOTTLES DRAWN AEROBIC AND ANAEROBIC Blood Culture adequate volume   Culture   Final    NO GROWTH 5 DAYS Performed at [facility], [address] [place], [place] [address]    Report Status 10/10/2022 FINAL  Final     Labs: BNP (last 3 results) Recent Labs    09/28/22 1827  BNP 222.2*   Basic Metabolic Panel: Recent Labs  Lab 10/07/22 0556 10/08/22 0437 10/09/22 0437 10/10/22 0428 10/11/22 0426 10/12/22 0535  NA 126* 129* 128* 127* 130* 130*  K 3.5 4.1 4.2 4.7 5.0 4.6  CL 97* 97* 98 98 100 99  CO2 23 26 24 22 22 24   GLUCOSE 172* 180* 166* 174* 141* 159*  BUN 35* 29* 30* 34* 38* 40*  CREATININE 1.47* 1.28* 1.10 1.04 1.09 1.14  CALCIUM 7.4* 7.5* 7.6* 7.8* 7.7* 7.8*  MG 2.2 2.3 2.1 2.1 2.2  --   PHOS 2.7 2.0* 2.6 2.8 3.2  --    Liver Function Tests: Recent Labs  Lab 10/12/22 0535  AST 153*  ALT 110*  ALKPHOS 265*  BILITOT 1.2  PROT 5.6*  ALBUMIN 1.8*   No results for input(s): "LIPASE", "AMYLASE" in the last 168 hours. No results for input(s): "AMMONIA" in the last 168  hours. CBC: Recent Labs  Lab 10/09/22 0437 10/10/22 0428 10/11/22 0521 10/12/22 0535 10/13/22 0423  WBC 19.6* 20.2* 16.7* 14.5* 12.7*  NEUTROABS 17.8* 17.9* 14.6* 12.9* 11.0*  HGB 9.3* 8.7* 8.6* 7.5* 7.5*  HCT 27.6* 25.0* 24.7* 22.3* 22.4*  MCV 83.1 81.4 82.9 84.8 84.5  PLT 263 266 234 262 255   Cardiac Enzymes: No results for input(s): "CKTOTAL", "CKMB", "CKMBINDEX", "TROPONINI" in the last 168 hours. BNP: Invalid input(s): "POCBNP" CBG: Recent Labs  Lab 10/12/22 2039 10/12/22 2351 10/13/22 0137 10/13/22 0416 10/13/22 0807  GLUCAP 151* 111* 125* 136* 147*   D-Dimer No results for input(s): "DDIMER" in the last 72 hours. Hgb A1c No results for input(s): "HGBA1C" in the last 72 hours. Lipid Profile No results for input(s): "CHOL", "HDL", "LDLCALC", "TRIG", "CHOLHDL", "LDLDIRECT" in the last 72 hours. Thyroid function studies No results for input(s): "TSH", "T4TOTAL", "T3FREE", "THYROIDAB" in the last 72 hours.  Invalid input(s): "FREET3" Anemia work up No results for input(s): "VITAMINB12", "FOLATE", "FERRITIN", "TIBC", "IRON", "RETICCTPCT" in the last 72 hours. Urinalysis    Component Value Date/Time   COLORURINE YELLOW (A) 09/29/2022 1205   APPEARANCEUR CLOUDY (A) 09/29/2022 1205   LABSPEC 1.019 09/29/2022 1205   PHURINE 5.0 09/29/2022 1205   GLUCOSEU NEGATIVE 09/29/2022 1205   HGBUR SMALL (A) 09/29/2022 1205   BILIRUBINUR NEGATIVE 09/29/2022 1205   BILIRUBINUR negative 04/09/2021 1404   KETONESUR NEGATIVE 09/29/2022 1205  PROTEINUR 30 (A) 09/29/2022 1205   UROBILINOGEN 0.2 04/09/2021 1404   NITRITE NEGATIVE 09/29/2022 1205   LEUKOCYTESUR NEGATIVE 09/29/2022 1205   Sepsis Labs Recent Labs  Lab 10/10/22 0428 10/11/22 0521 10/12/22 0535 10/13/22 0423  WBC 20.2* 16.7* 14.5* 12.7*   Microbiology Recent Results (from the past 240 hour(s))  Culture, blood (Routine X 2) w Reflex to ID Panel     Status: None   Collection Time: 10/05/22  6:31 AM    Specimen: BLOOD LEFT HAND  Result Value Ref Range Status   Specimen Description BLOOD LEFT HAND  Final   Special Requests   Final    BOTTLES DRAWN AEROBIC AND ANAEROBIC Blood Culture results may not be optimal due to an inadequate volume of blood received in culture bottles   Culture   Final    NO GROWTH 5 DAYS Performed at [facility], [address] [place], [place] [address]    Report Status 10/10/2022 FINAL  Final  Culture, blood (Routine X 2) w Reflex to ID Panel     Status: None   Collection Time: 10/05/22  6:31 AM   Specimen: BLOOD RIGHT HAND  Result Value Ref Range Status   Specimen Description BLOOD RIGHT HAND  Final   Special Requests   Final    BOTTLES DRAWN AEROBIC AND ANAEROBIC Blood Culture adequate volume   Culture   Final    NO GROWTH 5 DAYS Performed at [facility], [address] [place], [place] [address]    Report Status 10/10/2022 FINAL  Final     Time coordinating discharge: Over 30 minutes  SIGNED:   Tresa Moore, MD  [facility] 10/13/2022, 12:24 PM Pager   If 7PM-7AM, please contact night-coverage

## 2022-10-13 NOTE — Progress Notes (Signed)
  Chaplain On-Call responded to Spiritual Care Consult Order from Lolita Patella, MD. The request was to offer support during major life transition and end of life.  Chaplain met the patient's son [name] at bedside at 1145 hours. The patient was sleeping soundly.  [name] stated that the plan of care calls for his father to be discharged today to receive residential Hospice care.  Chaplain provided spiritual and emotional support for [name].  Chaplain Evelena Peat M.Div., [facility]

## 2022-10-13 NOTE — Progress Notes (Signed)
OT Cancellation Note  Patient Details Name: GERHART HIRSCHI MRN: [id number] DOB: 07-26-1945   Cancelled Treatment:    Reason Eval/Treat Not Completed: Other (comment). Per secure chat with staff, pt has elected to proceed with full comfort measures. OT to complete orders at this time.   Jackquline Denmark, MS, OTR/L , CBIS ascom [phone]  10/13/22, 9:27 AM

## 2022-10-13 NOTE — Plan of Care (Signed)
  Problem: Education: Goal: Ability to describe self-care measures that may prevent or decrease complications (Diabetes Survival Skills Education) will improve Outcome: Progressing   Problem: Coping: Goal: Ability to adjust to condition or change in health will improve Outcome: Not Progressing   Problem: Fluid Volume: Goal: Ability to maintain a balanced intake and output will improve Outcome: Not Progressing  Patient very often refuses bare minimum care in regards to skin protection. Continues to state he is comfortable and does not want to move.

## 2022-10-13 NOTE — TOC Transition Note (Signed)
Transition of Care [facility]) - CM/SW Discharge Note   Patient Details  Name: Cameron Gutierrez MRN: [id number] Date of Birth: 26-Dec-1945  Transition of Care [facility]) CM/SW Contact:  Truddie Hidden, RN Phone Number: 10/13/2022, 10:52 AM   Clinical Narrative:    Patient family is requesting hospice. Spoke with patient's son. They would prefer [facility]. Ree Kida from [employer] notified.          Patient Goals and CMS Choice      Discharge Placement                         Discharge Plan and Services Additional resources added to the After Visit Summary for                                       Social Determinants of Health (SDOH) Interventions SDOH Screenings   Food Insecurity: No Food Insecurity (09/26/2022)  Housing: Low Risk  (09/26/2022)  Transportation Needs: No Transportation Needs (09/26/2022)  Utilities: Not At Risk (09/26/2022)  Financial Resource Strain: Low Risk  (09/21/2022)   Received from [facility]  Stress: No Stress Concern Present (09/21/2022)   Received from [facility]  Tobacco Use: Low Risk  (09/26/2022)  Health Literacy: Medium Risk (09/21/2022)   Received from [facility]     Readmission Risk Interventions     No data to display

## 2022-10-13 NOTE — Plan of Care (Signed)
Just had 1 more talk with wife and son at bedside. Patient spoke up - requesting full comfort measures and removing NG - hoping to progress towards Hospice home ([address]). Family is on board and understands (hugs all around). Staff informed in chat and ALL on board.  0945 Comfort orders placed  - NG removed.  Dillaudid, ativan and Morphine given, per patient request.

## 2022-10-13 NOTE — Progress Notes (Signed)
PT Cancellation Note  Patient Details Name: HADRIAN LEGATES MRN: [id number] DOB: [birth year]   Cancelled Treatment:    Reason Eval/Treat Not Completed: Other (comment) Patient decided to go full comfort care. PT will sign off.    Dezaria Methot 10/13/2022, 10:10 AM

## 2022-10-13 NOTE — Progress Notes (Signed)
[facility]- Civil engineer, contracting  Received request from Transitions of Care Manger for family interest in [facility].  Eligibility has been confirmed.  Met with son to confirm interest and explain services. Son agreeable to transfer today.  Transitions of Care manager aware.  RN please call report to [phone] [facility]) prior to patient leaving the unit.  Please send signed and completed DNR with patient at discharge.  Thank you  Redge Gainer,  [facility] Liaison [phone]

## 2022-10-13 NOTE — Progress Notes (Signed)
Palliative Medicine [facility] Telephone:([phone] Fax:([phone]) [phone]   Name: Cameron Gutierrez Date: 10/13/2022 MRN: [id number]  DOB: 10/01/1945  Patient Care Team: Dondra Spry, MD as PCP - General (Internal Medicine) Clinic, General Medical (Family Medicine)    REASON FOR CONSULTATION: Cameron Gutierrez is a 77 y.o. male with multiple medical problems including diabetes, hypertension, CKD stage II, A-fib on Eliquis, who was recently hospitalized at [facility] 09/21/2022 to 09/25/2022 for abdominal pain and weight loss with workup consistent with metastatic adenocarcinoma of the pancreas. Patient readmitted here on 09/26/2022 with intractable abdominal pain. Was found to have acalculus cholecystitis. Hospitalization has been complicated by A-fib. Palliative care was consulted to address goals and manage ongoing symptoms.    CODE STATUS: DNR  PAST MEDICAL HISTORY: Past Medical History:  Diagnosis Date   Atrial fibrillation (HCC)    Cancer (HCC)    pancreatic   Diabetes mellitus without complication (HCC)    Hypercholesteremia    Hypertension     PAST SURGICAL HISTORY:  Past Surgical History:  Procedure Laterality Date   IR EXCHANGE BILIARY DRAIN  10/12/2022   IR THORACENTESIS ASP PLEURAL SPACE W/IMG GUIDE  10/12/2022   KNEE SURGERY     REPLACEMENT TOTAL KNEE Right    SHOULDER SURGERY      HEMATOLOGY/ONCOLOGY HISTORY:  Oncology History   No history exists.    ALLERGIES:  has No Known Allergies.  MEDICATIONS:  Current Facility-Administered Medications  Medication Dose Route Frequency Provider Last Rate Last Admin   0.9 %  sodium chloride infusion  250 mL Intravenous Continuous Ezequiel Essex, NP 10 mL/hr at 10/07/22 2153 250 mL at 10/07/22 2153   acetaminophen (TYLENOL) tablet 650 mg  650 mg Oral Q6H PRN Tresa Moore, MD       Or   acetaminophen (TYLENOL) suppository 650 mg  650 mg Rectal Q6H PRN Lolita Patella B, MD        acetaminophen (TYLENOL) tablet 500 mg  500 mg Oral QHS Sreenath, Sudheer B, MD       alum & mag hydroxide-simeth (MAALOX/MYLANTA) 200-200-20 MG/5ML suspension 30 mL  30 mL Oral Q4H PRN Andris Baumann, MD   30 mL at 10/07/22 0024   antiseptic oral rinse (BIOTENE) solution 15 mL  15 mL Topical PRN Lolita Patella B, MD       bisacodyl (DULCOLAX) suppository 10 mg  10 mg Rectal Daily PRN Lolita Patella B, MD   10 mg at 10/09/22 1448   chlorproMAZINE (THORAZINE) 12.5 mg in sodium chloride 0.9 % 25 mL IVPB  12.5 mg Intravenous Q6H PRN Sreenath, Sudheer B, MD       diphenhydrAMINE (BENADRYL) injection 12.5 mg  12.5 mg Intravenous Q4H PRN Sreenath, Sudheer B, MD       feeding supplement (ENSURE ENLIVE / ENSURE PLUS) liquid 237 mL  237 mL Oral TID BM Hall, Carole N, DO   237 mL at 10/11/22 5366   glycopyrrolate (ROBINUL) tablet 1 mg  1 mg Oral Q4H PRN Lolita Patella B, MD       Or   glycopyrrolate (ROBINUL) injection 0.2 mg  0.2 mg Subcutaneous Q4H PRN Sreenath, Sudheer B, MD       Or   glycopyrrolate (ROBINUL) injection 0.2 mg  0.2 mg Intravenous Q4H PRN Sreenath, Sudheer B, MD       HYDROmorphone (DILAUDID) injection 0.5 mg  0.5 mg Intravenous Q2H PRN Tresa Moore, MD  0.5 mg at 10/13/22 1001   levalbuterol (XOPENEX) nebulizer solution 0.63 mg  0.63 mg Nebulization Q6H PRN Ezequiel Essex, NP   0.63 mg at 10/06/22 6433   LORazepam (ATIVAN) tablet 1 mg  1 mg Oral Q4H PRN Lolita Patella B, MD       Or   LORazepam (ATIVAN) 2 MG/ML concentrated solution 1 mg  1 mg Sublingual Q4H PRN Georgeann Oppenheim, Sudheer B, MD       Or   LORazepam (ATIVAN) injection 1 mg  1 mg Intravenous Q4H PRN Lolita Patella B, MD   1 mg at 10/13/22 1001   magic mouthwash w/lidocaine  15 mL Oral Q6H PRN Lolita Patella B, MD       mirtazapine (REMERON SOL-TAB) disintegrating tablet 15 mg  15 mg Oral QHS Sreenath, Sudheer B, MD       morphine CONCENTRATE 10 MG/0.5ML oral solution 5 mg  5 mg Oral Q2H PRN Georgeann Oppenheim,  Sudheer B, MD       Or   morphine CONCENTRATE 10 MG/0.5ML oral solution 5 mg  5 mg Sublingual Q2H PRN Georgeann Oppenheim, Sudheer B, MD       nystatin (MYCOSTATIN/NYSTOP) topical powder   Topical TID PRN Lolita Patella B, MD       ondansetron (ZOFRAN) tablet 4 mg  4 mg Oral Q6H PRN Enedina Finner, MD       Or   ondansetron [facility]) injection 4 mg  4 mg Intravenous Q6H PRN Enedina Finner, MD   4 mg at 09/28/22 1638   oxybutynin (DITROPAN) tablet 2.5 mg  2.5 mg Oral QID PRN Lolita Patella B, MD       polyethylene glycol (MIRALAX / GLYCOLAX) packet 17 g  17 g Oral Daily PRN Georgeann Oppenheim, Sudheer B, MD       polyvinyl alcohol (LIQUIFILM TEARS) 1.4 % ophthalmic solution 1 drop  1 drop Both Eyes QID PRN Georgeann Oppenheim, Sudheer B, MD       promethazine (PHENERGAN) 12.5 mg in sodium chloride 0.9 % 50 mL IVPB  12.5 mg Intravenous Q6H PRN Enedina Finner, MD       traZODone (DESYREL) tablet 25 mg  25 mg Oral QHS PRN Lolita Patella B, MD        VITAL SIGNS: BP (!) 88/64 (BP Location: Right Arm)   Pulse 82   Temp (!) 97.5 F (36.4 C)   Resp 20   Ht 6' (1.829 m)   Wt 198 lb 6.6 oz (90 kg)   SpO2 96%   BMI 26.91 kg/m  Filed Weights   10/10/22 0318 10/12/22 0300 10/12/22 1443  Weight: 198 lb 3.1 oz (89.9 kg) 198 lb 6.6 oz (90 kg) 198 lb 6.6 oz (90 kg)    Estimated body mass index is 26.91 kg/m as calculated from the following:   Height as of this encounter: 6' (1.829 m).   Weight as of this encounter: 198 lb 6.6 oz (90 kg).  LABS: CBC:    Component Value Date/Time   WBC 12.7 (H) 10/13/2022 0423   HGB 7.5 (L) 10/13/2022 0423   HGB 13.3 09/29/2022 1139   HCT 22.4 (L) 10/13/2022 0423   HCT 43.7 09/24/2013 0122   PLT 255 10/13/2022 0423   PLT 170 09/24/2013 0122   MCV 84.5 10/13/2022 0423   MCV 87 09/24/2013 0122   NEUTROABS 11.0 (H) 10/13/2022 0423   NEUTROABS 5.5 09/24/2013 0122   LYMPHSABS 0.8 10/13/2022 0423   LYMPHSABS 1.4 09/24/2013 0122   MONOABS 0.7 10/13/2022  0423   MONOABS 0.7 09/24/2013 0122    EOSABS 0.1 10/13/2022 0423   EOSABS 0.3 09/24/2013 0122   BASOSABS 0.0 10/13/2022 0423   BASOSABS 0.0 09/24/2013 0122   Comprehensive Metabolic Panel:    Component Value Date/Time   NA 130 (L) 10/12/2022 0535   NA 141 04/09/2021 1427   NA 141 09/24/2013 0122   K 4.6 10/12/2022 0535   K 3.8 09/24/2013 0122   CL 99 10/12/2022 0535   CL 105 09/24/2013 0122   CO2 24 10/12/2022 0535   CO2 28 09/24/2013 0122   BUN 40 (H) 10/12/2022 0535   BUN 24 04/09/2021 1427   BUN 29 (H) 09/24/2013 0122   CREATININE 1.14 10/12/2022 0535   CREATININE 1.50 (H) 09/24/2013 0122   GLUCOSE 159 (H) 10/12/2022 0535   GLUCOSE 127 (H) 09/24/2013 0122   CALCIUM 7.8 (L) 10/12/2022 0535   CALCIUM 8.8 09/24/2013 0122   AST 153 (H) 10/12/2022 0535   ALT 110 (H) 10/12/2022 0535   ALKPHOS 265 (H) 10/12/2022 0535   BILITOT 1.2 10/12/2022 0535   PROT 5.6 (L) 10/12/2022 0535   ALBUMIN 1.8 (L) 10/12/2022 0535    RADIOGRAPHIC STUDIES: DG Chest Port 1 View  Result Date: 10/12/2022 CLINICAL DATA:  Pleural effusion post thoracentesis EXAM: PORTABLE CHEST 1 VIEW COMPARISON:  10/10/2022, 09/29/2022, CT 10/10/2022 FINDINGS: Right sided subdiaphragmatic drainage catheter and right cholecystostomy tube are noted. Small moderate residual right pleural effusion, decreased compared to previous radiograph from 07/27. No convincing pneumothorax. Airspace disease at right base. Stable cardiomediastinal silhouette. Mild basilar atelectasis on the left. Esophageal tube tip below the diaphragm but incompletely visualized IMPRESSION: Small to moderate residual right pleural effusion, decreased compared to previous radiograph from 07/27. No convincing pneumothorax. Persistent airspace disease at right base Electronically Signed   By: Jasmine Pang M.D.   On: 10/12/2022 20:42   IR EXCHANGE BILIARY DRAIN  Result Date: 10/12/2022 INDICATION: Malfunctioning cholecystostomy drainage catheter EXAM: Exchange and upsize of cholecystostomy  drainage catheter using fluoroscopic guidance MEDICATIONS: Documented in the EMR ANESTHESIA/SEDATION: Local analgesia FLUOROSCOPY: Radiation Exposure Index (as provided by the fluoroscopic device): 1 minute (8 mGy) COMPLICATIONS: None immediate. PROCEDURE: Informed written consent was obtained from the patient after a thorough discussion of the procedural risks, benefits and alternatives. All questions were addressed. Maximal Sterile Barrier Technique was utilized including caps, mask, sterile gowns, sterile gloves, sterile drape, hand hygiene and skin antiseptic. A timeout was performed prior to the initiation of the procedure. The patient was placed supine on the exam table. The right upper quadrant was prepped and draped in the standard sterile fashion with inclusion of the existing percutaneous cholecystostomy tube within the sterile field. Injection of the existing cholecystostomy tube demonstrated appropriate location of the gallbladder lumen. However, given reports of for function, the decision was made to proceed with exchange and upsize of the cholecystostomy drainage tube. Locking loop was released, and over an Amplatz wire, the percutaneous tract was serially dilated to accommodate a 12 French locking drainage catheter. Locking loop was formed, and injection demonstrated appropriate location of the gallbladder lumen. Luminal irregularities compatible with history of gangrenous cholecystitis. The drainage catheter was then secured to the skin using silk suture and a dressing. It was attached to bag drainage. The patient tolerated the procedure well without immediate complication. IMPRESSION: Successful exchange and upsize of percutaneous cholecystostomy tube for a new 12 French cholecystostomy drainage catheter. Drainage catheter placed to bag drainage. Electronically Signed   By: Olive Bass  M.D.   On: 10/12/2022 16:16   IR THORACENTESIS ASP PLEURAL SPACE W/IMG GUIDE  Result Date:  10/12/2022 INDICATION: Right pleural effusion EXAM: ULTRASOUND GUIDED RIGHT THORACENTESIS MEDICATIONS: None. COMPLICATIONS: None immediate. PROCEDURE: An ultrasound guided thoracentesis was thoroughly discussed with the patient and questions answered. The benefits, risks, alternatives and complications were also discussed. The patient understands and wishes to proceed with the procedure. Written consent was obtained. Ultrasound was performed to localize and mark an adequate pocket of fluid in the right chest. The area was then prepped and draped in the normal sterile fashion. 1% Lidocaine was used for local anesthesia. Under ultrasound guidance a 19 gauge, 7-cm, Yueh catheter was introduced. Thoracentesis was performed. The catheter was removed and a dressing applied. FINDINGS: A total of approximately 800 mL of dark amber pleural fluid was removed. IMPRESSION: Successful ultrasound guided right thoracentesis yielding 800 mL of dark amber pleural fluid. Electronically Signed   By: Olive Bass M.D.   On: 10/12/2022 16:12   DG Chest Port 1 View  Result Date: 10/10/2022 CLINICAL DATA:  [id number] Hypoxia [id number] EXAM: PORTABLE CHEST 1 VIEW COMPARISON:  September 29, 2022, October 10, 2022 FINDINGS: The cardiomediastinal silhouette is unchanged in contour.Weighted tip feeding tube tip terminates over the distal stomach. Layering RIGHT pleural effusion, grossly similar in comparison to prior CT but increased since July 16. RIGHT-sided perihepatic drain. RIGHT-sided percutaneous cholecystostomy. No pneumothorax. Hazy opacities throughout the RIGHT lung, likely atelectasis. LEFT basilar platelike opacity, likely atelectasis. IMPRESSION: Layering RIGHT pleural effusion, grossly similar in comparison to prior CT but increased since July 16. Electronically Signed   By: Meda Klinefelter M.D.   On: 10/10/2022 15:56   CT ABDOMEN PELVIS WO CONTRAST  Result Date: 10/10/2022 CLINICAL DATA:  Right upper quadrant abdominal pain.  History of pancreatic mass. EXAM: CT ABDOMEN AND PELVIS WITHOUT CONTRAST TECHNIQUE: Multidetector CT imaging of the abdomen and pelvis was performed following the standard protocol without IV contrast. RADIATION DOSE REDUCTION: This exam was performed according to the departmental dose-optimization program which includes automated exposure control, adjustment of the mA and/or kV according to patient size and/or use of iterative reconstruction technique. COMPARISON:  October 05, 2022. FINDINGS: Lower chest: Bilateral pleural effusions are noted with adjacent atelectasis of both lower lobes, right greater than left. Hepatobiliary: Stable position of percutaneous drainage catheter seen into right subdiaphragmatic space. Stable position of cholecystostomy tube seen within gallbladder lumen. There remains gas within the wall the gallbladder suggesting emphysematous cholecystitis. Common bile duct stent is again noted. Multiple hepatic masses are again noted consistent with metastatic disease. Left hepatic pneumobilia is noted. Pancreas: There remains large mass involving pancreatic head and body consistent with malignancy which is not significantly changed compared with prior exam performed 5 days ago. Spleen: Multiple calcified splenic granulomas are noted. Adrenals/Urinary Tract: Adrenal glands and kidneys appear normal. No hydronephrosis or renal obstruction is noted. Urinary bladder is decompressed secondary to Foley catheter. Stomach/Bowel: Feeding tube is seen in proximal duodenum. Stomach is otherwise unremarkable. There is no evidence of bowel obstruction or inflammation. The appendix appears normal. Sigmoid diverticulosis without inflammation. Vascular/Lymphatic: Aortic atherosclerosis. Retroperitoneal adenopathy is again noted and stable, including 2 cm retrocaval lymph node. Reproductive: Prostate is unremarkable. Other: Moderate anasarca is noted. Mild ascites is noted, including fluid around the liver and  spleen. Mild to moderate size bilateral inguinal hernias are noted which contain fat and fluid. Musculoskeletal: No acute or significant osseous findings. IMPRESSION: Bilateral pleural effusions are noted with associated atelectasis of  both lower lobes, right greater than left. Stable position of percutaneous cholecystostomy tube. Stable findings consistent with emphysematous cholecystitis. Stable position of percutaneous drainage catheter into right subdiaphragmatic space. Stable large mass involving pancreatic head and body with stent noted in common bile duct. Multiple hepatic metastatic lesions are again noted and stable. Moderate anasarca is noted.  Mild ascites is noted. Stable retroperitoneal adenopathy consistent metastatic disease. Electronically Signed   By: Lupita Raider M.D.   On: 10/10/2022 13:49   DG Abd 1 View  Result Date: 10/09/2022 CLINICAL DATA:  Abdominal pain.  History of pancreatic cancer. EXAM: ABDOMEN - 1 VIEW COMPARISON:  CT 10/05/2022 FINDINGS: Tip of the weighted enteric tube in the right upper quadrant in the region of the distal stomach. There is gaseous gastric distension. Tube biliary drains and a biliary stent are in place. Small to moderate volume of stool in the colon. Increased air throughout mildly dilated small bowel in the central abdomen. Pelvic calcifications consistent with phleboliths. Vascular calcifications are seen. IMPRESSION: 1. Tip of the weighted enteric tube in the right upper quadrant in the region of the distal stomach. Gaseous gastric distension. 2. Increased air throughout mildly dilated small bowel in the central abdomen, may be ileus or enteritis. Electronically Signed   By: Narda Rutherford M.D.   On: 10/09/2022 13:09   DG Basil Dess Tube Plc W/Fl W/Rad  Result Date: 10/07/2022 CLINICAL DATA:  Provided history: Dysphagia. Request received for NG tube placement under fluoroscopy. EXAM: NASO G TUBE PLACEMENT WITH FL AND WITH RAD CONTRAST:  None  FLUOROSCOPY: Radiation Exposure Index (if provided by the fluoroscopic device): 6.30 mGy air kerma COMPARISON:  CT abdomen/pelvis 10/05/2022. FINDINGS: The tip of the enteric tube was lubricated and gently inserted into the right nostril. Under fluoroscopic guidance, the tube easily advanced into the esophagus and subsequently into the stomach. The tip of the nasogastric tube reach the gastric antrum. The procedure was performed by Alwyn Ren NP, supervised by Dr. Jackey Loge. IMPRESSION: Technically successful fluoroscopically-guided placement of a 10 French nasogastric feeding tube. The tip of the tube is located within the gastric antrum. Electronically Signed   By: Jackey Loge D.O.   On: 10/07/2022 16:05   CT ABDOMEN PELVIS WO CONTRAST  Result Date: 10/05/2022 CLINICAL DATA:  Ascites. Recent diagnosis of pancreatic cancer and biliary obstruction status post common bile duct stent placement. A facet [place]/structured acute cholecystitis. EXAM: CT ABDOMEN AND PELVIS WITHOUT CONTRAST TECHNIQUE: Multidetector CT imaging of the abdomen and pelvis was performed following the standard protocol without IV contrast. RADIATION DOSE REDUCTION: This exam was performed according to the departmental dose-optimization program which includes automated exposure control, adjustment of the mA and/or kV according to patient size and/or use of iterative reconstruction technique. COMPARISON:  CT abdomen pelvis dated 09/29/2022. FINDINGS: Evaluation of this exam is limited in the absence of intravenous contrast. Lower chest: Partially visualized small bilateral pleural effusions and consolidative changes of the majority of the visualized lower lobes with air bronchograms which may represent atelectasis or pneumonia. A 5 mm nodule in the lingula as well as a 6 mm left lower lobe nodule. Small pockets of pneumoperitoneum in the upper abdomen likely introduced via drainage catheter placement. There is a small ascites.  Hepatobiliary: Hypodense mass involving the caudate lobe of the liver measure up to 7.4 cm in keeping with metastatic disease. Additional ill-defined hypodense lesions within the liver also consistent with metastatic disease. There is mild dilatation and pneumobilia. The gallbladder is  poorly visualized. There is air in the gallbladder in keeping with emphysematous cholecystitis. A percutaneous cholecystostomy with pigtail tip in the body of the gallbladder noted. There is debris within the gallbladder. Pancreas: Ill-defined mass at the head of the pancreas measuring 6.5 x 7.3 cm in keeping with known malignancy. This mass appears to encase the common bile duct. A common bowel duct stent is noted. There is air in the proximal lumen of the stent. There is debris or infiltrative mass in the distal lumen of the stent. Spleen: Normal in size without focal abnormality. Adrenals/Urinary Tract: The adrenal glands are unremarkable. Mild bilateral hydronephrosis versus parapelvic cysts. No renal calculi noted. The visualized ureters appear unremarkable. The urinary bladder is decompressed around a Foley catheter. Stomach/Bowel: There is sigmoid diverticulosis without active inflammatory changes. There is no bowel obstruction. The appendix is suboptimally visualized but grossly unremarkable. Vascular/Lymphatic: Mild aortoiliac atherosclerotic disease. The IVC is unremarkable. No portal venous gas. Retroperitoneal adenopathy including a 2 cm retrocaval lymph node. Reproductive: The prostate and seminal vesicles are grossly unremarkable. Other: Small fat containing bilateral inguinal hernia. There is diffuse subcutaneous edema. Musculoskeletal: No acute osseous pathology. IMPRESSION: 1. 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 2. Pancreatic head mass in keeping with known malignancy. 3. Hepatic metastatic disease. 4. Retroperitoneal adenopathy. 5. Small ascites. 6. Small bilateral pleural effusions and  bibasilar atelectasis or infiltrate. 7. Aortic Atherosclerosis (ICD10-I70.0) and Emphysema (ICD10-J43.9). Electronically Signed   By: Elgie Collard M.D.   On: 10/05/2022 19:23   DG Abd 1 View  Result Date: 09/30/2022 CLINICAL DATA:  NG tube placement EXAM: ABDOMEN - 1 VIEW COMPARISON:  CT abdomen/pelvis 1 day prior FINDINGS: The enteric catheter tip and sidehole are in the stomach. The stomach remains significantly distended. Air in the gallbladder lumen and wall is noted consistent with the findings of emphysematous cholecystitis seen on CT from 1 day prior. The free intraperitoneal air seen on that study is not definitely seen on this study. There is gaseous distention of the bowel in the imaged abdomen. A biliary stent is noted. IMPRESSION: 1. Enteric catheter tip and sidehole in the stomach which remains distended. 2. Air in the gallbladder lumen and wall consistent with the findings of emphysematous cholecystitis on the CT study from 1 day prior. The pneumoperitoneum seen on that study is not appreciated on the current study. Electronically Signed   By: Lesia Hausen M.D.   On: 09/30/2022 13:28   CT PERC CHOLECYSTOSTOMY  Result Date: 09/30/2022 INDICATION: 77 year old male with history recently diagnosed advanced stage pancreatic cancer with biliary obstruction status post covered common bile duct stent placement at outside facility approximally 1 week ago, now with emphysematous/ruptured acute cholecystitis with associated perihepatic fluid collection. EXAM: CT PERC DRAIN PERITONEAL ABCESS; CT PERCUTANEOUS CHOLECYSTOSTOMY COMPARISON:  None Available. MEDICATIONS: The patient is currently admitted to the hospital and receiving intravenous antibiotics. The antibiotics were administered within an appropriate time frame prior to the initiation of the procedure. ANESTHESIA/SEDATION: Moderate (conscious) sedation was employed during this procedure. A total of Versed 0.5 mg and Fentanyl 25 mcg was  administered intravenously. Moderate Sedation Time: 16 minutes. The patient's level of consciousness and vital signs were monitored continuously by radiology nursing throughout the procedure under my direct supervision. CONTRAST:  None COMPLICATIONS: None immediate. PROCEDURE: RADIATION DOSE REDUCTION: This exam was performed according to the departmental dose-optimization program which includes automated exposure control, adjustment of the mA and/or kV according to patient size and/or use of iterative reconstruction technique. Informed  written consent was obtained from the patient after a discussion of the risks, benefits and alternatives to treatment. The patient was placed supine on the CT gantry and a pre procedural CT was performed re-demonstrating the known abscess/fluid collection within the gallbladder and perihepatic region. The procedure was planned. A timeout was performed prior to the initiation of the procedure. The right upper quadrant was prepped and draped in the usual sterile fashion. The overlying soft tissues were anesthetized with 1% lidocaine with epinephrine. Appropriate trajectories were planned with the use of a 22 gauge spinal needle. Two separate 18 gauge trocar needles were advanced into the gallbladder as well as into the perihepatic fluid collection and short Amplatz super stiff wires were coiled within the gallbladder and perihepatic fluid collection. Appropriate positioning was confirmed with a limited CT scan. The tracks were serially dilated allowing placement of 2, 10 French all-purpose drainage catheters. Appropriate positioning was confirmed with a limited postprocedural CT scan. Approximately 100 ml of bilious and purulent fluid was aspirated from the gallbladder in a proximally 500 mL of similar appearing, bilious and purulent fluid was aspirated from the perihepatic fluid collection. The drains were connected to a bulb suction and sutured in place. Dressings were placed. The  patient tolerated the procedure well without immediate post procedural complication. IMPRESSION: 1. Successful CT guided placement of a 10 French percutaneous cholecystostomy drain with aspiration of 100 mL of bilious and purulent fluid. Samples were sent to the laboratory as requested by the ordering clinical team. 2. Successful CT guided placement of a 10 French right upper quadrant perihepatic fluid collection drain with aspiration of 500 mL of bilious and purulent fluid. Marliss Coots, MD [facility] Radiology Electronically Signed   By: Marliss Coots M.D.   On: 09/30/2022 13:19   CT GUIDED PERITONEAL/RETROPERITONEAL FLUID DRAIN BY PERC CATH  Result Date: 09/30/2022 INDICATION: 77 year old male with history recently diagnosed advanced stage pancreatic cancer with biliary obstruction status post covered common bile duct stent placement at outside facility approximally 1 week ago, now with emphysematous/ruptured acute cholecystitis with associated perihepatic fluid collection. EXAM: CT PERC DRAIN PERITONEAL ABCESS; CT PERCUTANEOUS CHOLECYSTOSTOMY COMPARISON:  None Available. MEDICATIONS: The patient is currently admitted to the hospital and receiving intravenous antibiotics. The antibiotics were administered within an appropriate time frame prior to the initiation of the procedure. ANESTHESIA/SEDATION: Moderate (conscious) sedation was employed during this procedure. A total of Versed 0.5 mg and Fentanyl 25 mcg was administered intravenously. Moderate Sedation Time: 16 minutes. The patient's level of consciousness and vital signs were monitored continuously by radiology nursing throughout the procedure under my direct supervision. CONTRAST:  None COMPLICATIONS: None immediate. PROCEDURE: RADIATION DOSE REDUCTION: This exam was performed according to the departmental dose-optimization program which includes automated exposure control, adjustment of the mA and/or  kV according to patient size and/or use of iterative reconstruction technique. Informed written consent was obtained from the patient after a discussion of the risks, benefits and alternatives to treatment. The patient was placed supine on the CT gantry and a pre procedural CT was performed re-demonstrating the known abscess/fluid collection within the gallbladder and perihepatic region. The procedure was planned. A timeout was performed prior to the initiation of the procedure. The right upper quadrant was prepped and draped in the usual sterile fashion. The overlying soft tissues were anesthetized with 1% lidocaine with epinephrine. Appropriate trajectories were planned with the use of a 22 gauge spinal needle. Two separate 18 gauge trocar needles were advanced  into the gallbladder as well as into the perihepatic fluid collection and short Amplatz super stiff wires were coiled within the gallbladder and perihepatic fluid collection. Appropriate positioning was confirmed with a limited CT scan. The tracks were serially dilated allowing placement of 2, 10 French all-purpose drainage catheters. Appropriate positioning was confirmed with a limited postprocedural CT scan. Approximately 100 ml of bilious and purulent fluid was aspirated from the gallbladder in a proximally 500 mL of similar appearing, bilious and purulent fluid was aspirated from the perihepatic fluid collection. The drains were connected to a bulb suction and sutured in place. Dressings were placed. The patient tolerated the procedure well without immediate post procedural complication. IMPRESSION: 1. Successful CT guided placement of a 10 French percutaneous cholecystostomy drain with aspiration of 100 mL of bilious and purulent fluid. Samples were sent to the laboratory as requested by the ordering clinical team. 2. Successful CT guided placement of a 10 French right upper quadrant perihepatic fluid collection drain with aspiration of 500 mL of  bilious and purulent fluid. Marliss Coots, MD [facility] Radiology Electronically Signed   By: Marliss Coots M.D.   On: 09/30/2022 13:19   CT ABDOMEN PELVIS WO CONTRAST  Addendum Date: 09/29/2022   ADDENDUM REPORT: 09/29/2022 23:18 ADDENDUM: These results were called by telephone at the time of interpretation on 09/29/2022 at 11:14 pm to provider Dr. Everlene Farrier, who verbally acknowledged these results. Electronically Signed   By: Tish Frederickson M.D.   On: 09/29/2022 23:18   Result Date: 09/29/2022 CLINICAL DATA:  Bowel obstruction suspected EXAM: CT ABDOMEN AND PELVIS WITHOUT CONTRAST TECHNIQUE: Multidetector CT imaging of the abdomen and pelvis was performed following the standard protocol without IV contrast. RADIATION DOSE REDUCTION: This exam was performed according to the departmental dose-optimization program which includes automated exposure control, adjustment of the mA and/or kV according to patient size and/or use of iterative reconstruction technique. COMPARISON:  CT abdomen pelvis 09/26/2022 FINDINGS: Lower chest: Partially visualized bilateral small pleural effusions with bilateral lower lobe passive atelectasis. Hepatobiliary: Redemonstration of a couple of hepatic lesions consistent with known metastases. Air-fluid level within the gastric lumen. Air noted within the wall of the gallbladder. Common bile duct stent in appropriate position with associated pneumobilia. Pancreas: Proximal pancreatic mass again noted in difficult to delineate on this study. Finding measures approximately 12.6 x 4.7 cm extending along the liver and duodenum. No surrounding inflammatory changes. No main pancreatic ductal dilatation. Spleen: Normal in size without focal abnormality. Adrenals/Urinary Tract: No adrenal nodule bilaterally. No nephrolithiasis and no hydronephrosis. Several fluid density lesions likely represent simple renal cysts. Simple renal cysts, in the  absence of clinically indicated signs/symptoms, require no independent follow-up. No ureterolithiasis or hydroureter. The urinary bladder is decompressed with Foley catheter tip and balloon terminating within the lumen. Stomach/Bowel: Stomach distended with air-fluid level. Bowel thickening of the first portion of the duodenum suggestive of possible metastatic invasion. No evidence of large bowel wall thickening or dilatation. Colonic diverticulosis. Appendix appears normal. Vascular/Lymphatic: No abdominal aorta or iliac aneurysm. Moderate atherosclerotic plaque of the aorta and its branches. No abdominal, pelvic, or inguinal lymphadenopathy. Reproductive: Prostate is unremarkable. Other: Interval increase in trace to small volume simple free fluid within the right upper quadrant. Associated free gas. No intraperitoneal free gas. No organized fluid collection. Musculoskeletal: Bilateral inguinal hernias containing fat and fluid. Subcutaneus soft tissue edema. No suspicious lytic or blastic osseous lesions. No acute displaced fracture. Multilevel degenerative changes of the spine.  IMPRESSION: 1. Acute emphysematous cholecystitis. Recommend emergent surgical consultation. 2. Interval development of pneumoperitoneum. 3. Interval increase in small volume perihepatic ascites. 4. Gastric lumen distended with air-fluid level. Developing obstruction along the first portion of duodenum in the region of the pancreatic malignancy not excluded. 5. Metastatic pancreatic malignancy poorly evaluated on this noncontrast study. Please see CT abdomen pelvis 09/26/2022 for further details. 6. Partially visualized bilateral small pleural effusions with bilateral lower lobe passive atelectasis. 7.  Aortic Atherosclerosis (ICD10-I70.0). These results were called by telephone at the time of interpretation on 09/29/2022 at 10:37 pm to provider Dr. Karna Christmas, who verbally acknowledged these results. Electronically Signed: By: Tish Frederickson M.D. On: 09/29/2022 22:44   US THORACENTESIS ASP PLEURAL SPACE W/IMG GUIDE  Result Date: 09/29/2022 INDICATION: Right pleural effusion.  Pancreatic cancer. EXAM: ULTRASOUND GUIDED RIGHT THORACENTESIS MEDICATIONS: None. COMPLICATIONS: None immediate. PROCEDURE: An ultrasound guided thoracentesis was thoroughly discussed with the patient and questions answered. The benefits, risks, alternatives and complications were also discussed. The patient understands and wishes to proceed with the procedure. Written consent was obtained. Ultrasound was performed to localize and mark an adequate pocket of fluid in the right chest. The area was then prepped and draped in the normal sterile fashion. 1% Lidocaine was used for local anesthesia. Under ultrasound guidance a 6 Fr Safe-T-Centesis catheter was introduced. Thoracentesis was performed. The catheter was removed and a dressing applied. FINDINGS: A total of approximately 800 mL of dark yellow fluid was removed. Samples were sent to the laboratory as requested by the clinical team. Images were also obtained of the right upper abdomen to evaluate the gallbladder. Loculated perihepatic ascites is identified. Previously identified gallbladder is poorly characterized because there appears to be air within the gallbladder. The amount of fluid around the liver has significantly increased since the right upper quadrant ultrasound 06/27/2022. IMPRESSION: 1. Successful ultrasound guided right thoracentesis yielding 800 mL of pleural fluid. 2. Images of the right upper quadrant demonstrate markedly increased perihepatic ascites. Ascites appears to be complex. In addition, the gallbladder now appears to contain a large amount of gas which is likely related to the biliary stent and pneumobilia. Electronically Signed   By: Richarda Overlie M.D.   On: 09/29/2022 15:52   DG Chest Port 1 View  Result Date: 09/29/2022 CLINICAL DATA:  Pleural effusion.  Status post thoracentesis. EXAM:  PORTABLE CHEST 1 VIEW COMPARISON:  Chest radiographs 09/28/2022 and 09/26/2022. Abdominal CT 09/26/2022. FINDINGS: 1417 hours. Interval decreased volume of right pleural effusion. A left pleural effusion and left basilar airspace disease appear unchanged. No pneumothorax. The heart size and mediastinal contours are stable. Moderate gaseous distension of the stomach noted. Curvilinear lucency in the right upper quadrant corresponds with air in the gallbladder lumen on recent CT. Patient has a biliary stent. IMPRESSION: 1. Decreased right pleural effusion following thoracentesis. No pneumothorax. 2. No significant change in left pleural effusion and left basilar airspace disease. Electronically Signed   By: Carey Bullocks M.D.   On: 09/29/2022 15:46   ECHOCARDIOGRAM COMPLETE  Result Date: 09/29/2022    ECHOCARDIOGRAM REPORT   Patient Name:   DHYAN LENK Date of Exam: 09/29/2022 Medical Rec #:  [id number]       Height:       72.0 in Accession #:    [id number]      Weight:       163.0 lb Date of Birth:  05-09-1945       BSA:  1.953 m Patient Age:    76 years        BP:           112/74 mmHg Patient Gender: M               HR:           115 bpm. Exam Location:  [facility] Procedure: 2D Echo, Cardiac Doppler and Color Doppler Indications:     Atrial Fibrillation I48.91  History:         Patient has no prior history of Echocardiogram examinations.                  Arrythmias:Atrial Fibrillation; Risk Factors:Diabetes and                  Hypertension.  Sonographer:     Cristela Blue Referring Phys:  [id number] SONA PATEL Diagnosing Phys: Yvonne Kendall MD IMPRESSIONS  1. Left ventricular ejection fraction, by estimation, is 60 to 65%. The left ventricle has normal function. The left ventricle has no regional wall motion abnormalities. There is mild asymmetric left ventricular hypertrophy of the basal-septal segment. Left ventricular diastolic function could not be evaluated.  2. Pulmonary artery pressure is mildly  elevated (RVSP 30-35 mmHg plus central venous/right atrial pressure). Right ventricular systolic function is normal. The right ventricular size is normal.  3. Moderate pleural effusion in the right lateral region.  4. The mitral valve is normal in structure. Mild to moderate mitral valve regurgitation.  5. Tricuspid valve regurgitation is mild to moderate.  6. The aortic valve is tricuspid. Aortic valve regurgitation is mild. No aortic stenosis is present. FINDINGS  Left Ventricle: Left ventricular ejection fraction, by estimation, is 60 to 65%. The left ventricle has normal function. The left ventricle has no regional wall motion abnormalities. The left ventricular internal cavity size was normal in size. There is  mild asymmetric left ventricular hypertrophy of the basal-septal segment. Left ventricular diastolic function could not be evaluated due to atrial fibrillation. Left ventricular diastolic function could not be evaluated. Right Ventricle: Pulmonary artery pressure is mildly elevated (RVSP 30-35 mmHg plus central venous/right atrial pressure). The right ventricular size is normal. No increase in right ventricular wall thickness. Right ventricular systolic function is normal. Left Atrium: Left atrial size was normal in size. Right Atrium: Right atrial size was normal in size. Pericardium: The pericardium was not well visualized. Mitral Valve: The mitral valve is normal in structure. Mild to moderate mitral valve regurgitation. Tricuspid Valve: The tricuspid valve is normal in structure. Tricuspid valve regurgitation is mild to moderate. Aortic Valve: The aortic valve is tricuspid. Aortic valve regurgitation is mild. No aortic stenosis is present. Aortic valve mean gradient measures 3.0 mmHg. Aortic valve peak gradient measures 4.7 mmHg. Aortic valve area, by VTI measures 3.27 cm. Pulmonic Valve: The pulmonic valve was grossly normal. Pulmonic valve regurgitation is trivial. No evidence of pulmonic  stenosis. Aorta: The aortic root is normal in size and structure. Pulmonary Artery: The pulmonary artery is not well seen. Venous: The inferior vena cava was not well visualized. IAS/Shunts: The interatrial septum was not well visualized. Additional Comments: There is a moderate pleural effusion in the right lateral region.  LEFT VENTRICLE PLAX 2D LVIDd:         3.90 cm LVIDs:         2.60 cm LV PW:         0.90 cm LV IVS:  1.20 cm LVOT diam:     2.20 cm LV SV:         47 LV SV Index:   24 LVOT Area:     3.80 cm  RIGHT VENTRICLE RV Basal diam:  3.50 cm RV Mid diam:    2.80 cm LEFT ATRIUM             Index        RIGHT ATRIUM           Index LA diam:        4.30 cm 2.20 cm/m   RA Area:     16.20 cm LA Vol (A2C):   70.1 ml 35.89 ml/m  RA Volume:   34.00 ml  17.41 ml/m LA Vol (A4C):   47.7 ml 24.42 ml/m LA Biplane Vol: 60.7 ml 31.08 ml/m  AORTIC VALVE AV Area (Vmax):    2.81 cm AV Area (Vmean):   2.73 cm AV Area (VTI):     3.27 cm AV Vmax:           108.00 cm/s AV Vmean:          74.350 cm/s AV VTI:            0.143 m AV Peak Grad:      4.7 mmHg AV Mean Grad:      3.0 mmHg LVOT Vmax:         79.80 cm/s LVOT Vmean:        53.400 cm/s LVOT VTI:          0.123 m LVOT/AV VTI ratio: 0.86  AORTA Ao Root diam: 3.66 cm MITRAL VALVE               TRICUSPID VALVE MV Area (PHT): 3.63 cm    TR Peak grad:   32.9 mmHg MV Decel Time: 209 msec    TR Vmax:        287.00 cm/s MV E velocity: 86.50 cm/s                            SHUNTS                            Systemic VTI:  0.12 m                            Systemic Diam: 2.20 cm Yvonne Kendall MD Electronically signed by Yvonne Kendall MD Signature Date/Time: 09/29/2022/2:31:41 PM    Final    US RENAL  Result Date: 09/29/2022 CLINICAL DATA:  Acute renal failure. EXAM: RENAL / URINARY TRACT ULTRASOUND COMPLETE COMPARISON:  September 26, 2022. FINDINGS: Right Kidney: Renal measurements: 9.6 x 5.1 x 4.7 cm = volume: 122 mL. At least 2 parapelvic cysts are noted, the  largest measuring 2.3 cm. Increased echogenicity of renal parenchyma is noted. No mass or hydronephrosis visualized. Left Kidney: Renal measurements: 10.1 x 5.9 x 5.5 cm = volume: 170 mL. Multi septated cyst measuring 2.3 x 2.3 x 1.9 cm is noted. 2.5 cm simple cyst is noted. Increased echogenicity of renal parenchyma is noted. No hydronephrosis visualized. Bladder: Appears normal for degree of bladder distention. Other: None. IMPRESSION: 2.3 cm multi-septated complex cyst seen in left kidney. Follow-up ultrasound in 6-12 months is recommended to ensure stability. Increased echogenicity of renal parenchyma is noted bilaterally suggesting medical renal disease. No hydronephrosis or  renal obstruction is noted. Electronically Signed   By: Lupita Raider M.D.   On: 09/29/2022 13:28   DG Chest Port 1 View  Result Date: 09/28/2022 CLINICAL DATA:  Shortness of breath. EXAM: PORTABLE CHEST 1 VIEW COMPARISON:  September 26, 2022 FINDINGS: Calcific atherosclerotic disease of the aorta. Cardiomediastinal silhouette is enlarged. Mediastinal contours appear intact. Bilateral pleural effusions, right greater than left. No focal airspace consolidation. Osseous structures are without acute abnormality. Soft tissues are grossly normal. IMPRESSION: 1. Bilateral pleural effusions, right greater than left. 2. Enlarged cardiomediastinal silhouette. Electronically Signed   By: Ted Mcalpine M.D.   On: 09/28/2022 15:37   US Abdomen Limited RUQ (LIVER/GB)  Result Date: 09/26/2022 CLINICAL DATA:  Abdominal pain. History of metastatic pancreatic cancer. EXAM: ULTRASOUND ABDOMEN LIMITED RIGHT UPPER QUADRANT COMPARISON:  CT abdomen pelvis from same day. FINDINGS: Gallbladder: No gallstones. Sludge with mild gallbladder wall thickening and small volume pericholecystic fluid. Positive sonographic Murphy sign noted by sonographer. Common bile duct: Diameter: 4 mm, normal.  Common bile duct stent noted. Liver: Liver lesions seen on CT are  not well identified by ultrasound. Within normal limits in parenchymal echogenicity. Portal vein is patent on color Doppler imaging with normal direction of blood flow towards the liver. Other: None. IMPRESSION: 1. Gallbladder sludge with mild gallbladder wall thickening and small volume pericholecystic fluid. Positive sonographic Murphy sign. Findings are concerning for acute acalculous cholecystitis. 2. Liver metastases seen on CT are not well identified by ultrasound. Electronically Signed   By: Obie Dredge M.D.   On: 09/26/2022 14:56   CT ABDOMEN PELVIS W CONTRAST  Result Date: 09/26/2022 CLINICAL DATA:  Central chest pain. Abdominal pain. History of pancreatic cancer. EXAM: CT ABDOMEN AND PELVIS WITH CONTRAST TECHNIQUE: Multidetector CT imaging of the abdomen and pelvis was performed using the standard protocol following bolus administration of intravenous contrast. RADIATION DOSE REDUCTION: This exam was performed according to the departmental dose-optimization program which includes automated exposure control, adjustment of the mA and/or kV according to patient size and/or use of iterative reconstruction technique. CONTRAST:  80mL OMNIPAQUE IOHEXOL 300 MG/ML  SOLN COMPARISON:  Chest radiograph 09/26/2022. Outside CTs from [facility] dated 09/21/2022 and 09/22/2022. Outside images are unavailable but reports are available. FINDINGS: Lower chest: Multiple small pulmonary nodules in the visualized lungs. Index pulmonary nodule is in the right lower lobe along the right major fissure on image 33/4 measuring up to 1.3 cm. There also may be a larger nodule in the left lower lobe measuring 1.4 cm image 15/4. Volume loss and compressive atelectasis in the left lower lobe. Compressive atelectasis in the right lower lobe. Bilateral small pleural effusions. Hepatobiliary: Multiple ill-defined hypoechoic liver lesions compatible with metastatic disease. Index lesion in the left hepatic lobe measuring 2.1 cm on image  23/2. There is gas and contrast within the gallbladder. The gallbladder is moderately distended and findings compatible with recent ERCP. Metallic biliary stent in the common bile duct. Narrowing in the biliary stent associated with the pancreatic mass. Small amount of pneumobilia. Small amount of fluid and stranding around the liver and gallbladder. Pancreas: Large poorly defined pancreatic head mass with multiple hypoechoic components. Difficult to accurately measure the mass itself but it roughly measures 4.0 x 4.9 x 5.1 cm. Dilatation of the main pancreatic duct. The mass extends cephalad and appears to be involving the caudate lobe. There is a caudate lesion measuring up to 4.1 cm. In addition, there is hypoechoic implant or fluid collection cephalad to the  caudate lobe involving the hepatic subcapsular space. This subcapsular collection or implant measures 7.8 x 4.6 x 5.6 cm. This was present on the previous exam based on the prior report. This caudate/subcapsular component causing mass effect on the adjacent IVC. Mild stranding and edema throughout the porta hepatis region. Spleen: Small splenic calcifications.  Spleen is normal for size. Adrenals/Urinary Tract: Normal adrenal glands. Bilateral cortical and parapelvic renal cysts. No suspicious renal lesions. No hydronephrosis. Bladder is decompressed. There is probably a small bladder diverticulum along the posterior aspect of the bladder. Cannot exclude mild bladder wall thickening. Asymmetric left perinephric stranding. Stomach/Bowel: Normal appearance of the stomach. No bowel dilatation. Colonic diverticula involving the sigmoid colon. No evidence for acute bowel inflammation. Vascular/Lymphatic: Atherosclerotic disease involving the abdominal aorta without aneurysm. Atherosclerotic calcifications at the origin of the SMA. Atherosclerotic calcifications at the origin of the bilateral renal arteries. IVC is patent but there is compression in the suprarenal  IVC due to the subcapsular fluid collection / mass. 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 Main portal vein is small but patent. Intrahepatic portal veins are patent. 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 Splenic vein is patent. Pathologic lymphadenopathy in the retrocaval space measuring up to 1.7 cm on image 39/2. Reproductive: Prostate contains calcifications, asymmetric towards the left. Other: Small amount of free fluid in the abdomen and pelvis. Musculoskeletal: Evidence for an old right tenth rib fracture. Bilateral inguinal hernias containing fat. Small amount of fluid in the left inguinal hernia. No suspicious osseous lesion. IMPRESSION: 1. Large pancreatic head mass compatible with the patient's history of pancreatic cancer. The mass extends cephalad and involves the caudate lobe. There is also a subcapsular fluid collection or implant cephalad to the caudate lobe causing mass effect on the adjacent IVC. 2. Multiple liver lesions, abdominal lymphadenopathy and multiple pulmonary nodules. Findings compatible with metastatic disease. 3. Metallic biliary stent in the common bile duct and there is a small amount of pneumobilia. No significant intrahepatic biliary dilatation. In addition, there is moderate distention of the gallbladder containing iodinated contrast and small amount of gas. Findings compatible with recent ERCP. Gallbladder distension could be a source of abdominal pain. 4. Bilateral small pleural effusions with compressive atelectasis in the lower lobes. 5. Small amount of free fluid in the abdomen and pelvis. 6. Colonic diverticulosis without evidence for acute diverticulitis. 7. Aortic Atherosclerosis (ICD10-I70.0). 8. 9 mm splenic artery aneurysm. Electronically Signed   By: Richarda Overlie M.D.   On: 09/26/2022 09:50   DG Chest 2 View  Result Date: 09/26/2022 CLINICAL DATA:   78 year old male with history of central chest pain. EXAM: CHEST - 2 VIEW COMPARISON:  Chest x-ray 07/28/2014. FINDINGS: Lung volumes are low. No consolidative airspace disease. Trace bilateral pleural effusions with bibasilar opacities that are favored to reflect subsegmental atelectasis. No pneumothorax. No evidence of pulmonary edema. No definite suspicious appearing pulmonary nodules or masses are noted. Heart size is normal. Upper mediastinal contours are within normal limits. Atherosclerotic calcifications in the thoracic aorta. IMPRESSION: 1. Low lung volumes with trace bilateral pleural effusions and bibasilar opacities favored to reflect areas of subsegmental atelectasis. 2. Aortic atherosclerosis. Electronically Signed   By: Trudie Reed M.D.   On: 09/26/2022 06:19    PERFORMANCE STATUS (ECOG) : 4 - Bedbound  Review of Systems Unless otherwise noted, a complete review of systems is negative.  Physical  Exam General: Frail HEENT: NGT Pulmonary: Unlabored Extremities: no edema, no joint deformities Skin: no rashes Neurological: Weakness but otherwise nonfocal  IMPRESSION: Follow-up visit.  Patient more awake today.  Son at bedside.  Patient tells me that after further conversation with hospitalist and RN that he has decided to pursue comfort measures and would like to transfer to hospice facility for end-of-life care.  He does not want to go home as he says he does not want to "die in the same bed that my wife has to sleep in."  Patient says that he has lived a good long life and is comfortable with this decision.  Symptomatically, he has pain currently.  Will have RN administer pain medication.  PLAN: -Transition to comfort care -Hospice referral for IPU   Time Total: 15 minutes  Visit consisted of counseling and education dealing with the complex and emotionally intense issues of symptom management and palliative care in the setting of serious and potentially  life-threatening illness.Greater than 50%  of this time was spent counseling and coordinating care related to the above assessment and plan.  Signed by: Laurette Schimke, PhD, NP-C

## 2022-11-15 DEATH — deceased
